# Patient Record
Sex: Male | Born: 1950 | Race: White | Hispanic: No | Marital: Married | Smoking: Never smoker
Health system: Southern US, Community
[De-identification: ages and names within clinical notes are randomized; demographics above are authoritative.]

## PROBLEM LIST (undated history)

## (undated) DIAGNOSIS — E669 Obesity, unspecified: Secondary | ICD-10-CM

## (undated) DIAGNOSIS — I4891 Unspecified atrial fibrillation: Secondary | ICD-10-CM

## (undated) DIAGNOSIS — E7800 Pure hypercholesterolemia, unspecified: Secondary | ICD-10-CM

## (undated) DIAGNOSIS — M1990 Unspecified osteoarthritis, unspecified site: Secondary | ICD-10-CM

## (undated) DIAGNOSIS — E119 Type 2 diabetes mellitus without complications: Secondary | ICD-10-CM

## (undated) DIAGNOSIS — H409 Unspecified glaucoma: Secondary | ICD-10-CM

## (undated) DIAGNOSIS — I4819 Other persistent atrial fibrillation: Secondary | ICD-10-CM

## (undated) DIAGNOSIS — C4491 Basal cell carcinoma of skin, unspecified: Secondary | ICD-10-CM

## (undated) DIAGNOSIS — I509 Heart failure, unspecified: Secondary | ICD-10-CM

## (undated) DIAGNOSIS — IMO0002 Reserved for concepts with insufficient information to code with codable children: Secondary | ICD-10-CM

## (undated) DIAGNOSIS — Z950 Presence of cardiac pacemaker: Secondary | ICD-10-CM

## (undated) DIAGNOSIS — I10 Essential (primary) hypertension: Secondary | ICD-10-CM

## (undated) DIAGNOSIS — I2510 Atherosclerotic heart disease of native coronary artery without angina pectoris: Secondary | ICD-10-CM

## (undated) DIAGNOSIS — R0600 Dyspnea, unspecified: Secondary | ICD-10-CM

## (undated) DIAGNOSIS — M359 Systemic involvement of connective tissue, unspecified: Secondary | ICD-10-CM

## (undated) DIAGNOSIS — K219 Gastro-esophageal reflux disease without esophagitis: Secondary | ICD-10-CM

## (undated) DIAGNOSIS — G4733 Obstructive sleep apnea (adult) (pediatric): Secondary | ICD-10-CM

## (undated) HISTORY — DX: Essential (primary) hypertension: I10

## (undated) HISTORY — DX: Pure hypercholesterolemia, unspecified: E78.00

## (undated) HISTORY — DX: Atherosclerotic heart disease of native coronary artery without angina pectoris: I25.10

## (undated) HISTORY — DX: Obstructive sleep apnea (adult) (pediatric): G47.33

## (undated) HISTORY — DX: Other persistent atrial fibrillation: I48.19

## (undated) HISTORY — DX: Morbid (severe) obesity due to excess calories: E66.01

## (undated) HISTORY — DX: Obesity, unspecified: E66.9

## (undated) HISTORY — PX: KNEE SURGERY: SHX244

## (undated) HISTORY — DX: Unspecified osteoarthritis, unspecified site: M19.90

## (undated) HISTORY — DX: Reserved for concepts with insufficient information to code with codable children: IMO0002

## (undated) HISTORY — PX: TONSILLECTOMY: SUR1361

## (undated) HISTORY — PX: TOTAL HIP ARTHROPLASTY: SHX124

## (undated) HISTORY — DX: Unspecified atrial fibrillation: I48.91

## (undated) HISTORY — DX: Unspecified glaucoma: H40.9

## (undated) HISTORY — DX: Type 2 diabetes mellitus without complications: E11.9

---
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
## 2001-04-17 ENCOUNTER — Encounter: Payer: Self-pay | Admitting: Cardiovascular Disease

## 2002-06-07 ENCOUNTER — Encounter: Admission: RE | Admit: 2002-06-07 | Discharge: 2002-06-07 | Payer: Self-pay | Admitting: Orthopedic Surgery

## 2002-06-07 ENCOUNTER — Encounter: Payer: Self-pay | Admitting: Orthopedic Surgery

## 2003-12-26 ENCOUNTER — Inpatient Hospital Stay (HOSPITAL_COMMUNITY): Admission: RE | Admit: 2003-12-26 | Discharge: 2003-12-30 | Payer: Self-pay | Admitting: Orthopedic Surgery

## 2003-12-26 ENCOUNTER — Ambulatory Visit: Payer: Self-pay | Admitting: Physical Medicine & Rehabilitation

## 2004-09-07 ENCOUNTER — Encounter: Payer: Self-pay | Admitting: Cardiovascular Disease

## 2004-09-07 ENCOUNTER — Ambulatory Visit: Payer: Self-pay | Admitting: Cardiology

## 2006-06-14 ENCOUNTER — Encounter: Payer: Self-pay | Admitting: Cardiovascular Disease

## 2010-03-01 ENCOUNTER — Encounter: Payer: Self-pay | Admitting: Cardiovascular Disease

## 2010-03-30 ENCOUNTER — Encounter: Payer: Self-pay | Admitting: Cardiovascular Disease

## 2010-03-31 ENCOUNTER — Encounter: Payer: Self-pay | Admitting: Cardiovascular Disease

## 2010-03-31 DIAGNOSIS — R072 Precordial pain: Secondary | ICD-10-CM

## 2010-04-16 ENCOUNTER — Encounter: Payer: Self-pay | Admitting: *Deleted

## 2010-04-17 ENCOUNTER — Ambulatory Visit (INDEPENDENT_AMBULATORY_CARE_PROVIDER_SITE_OTHER): Payer: BC Managed Care – PPO | Admitting: Cardiovascular Disease

## 2010-04-17 ENCOUNTER — Encounter: Payer: Self-pay | Admitting: Cardiovascular Disease

## 2010-04-17 ENCOUNTER — Encounter: Payer: Self-pay | Admitting: *Deleted

## 2010-04-17 DIAGNOSIS — Z01810 Encounter for preprocedural cardiovascular examination: Secondary | ICD-10-CM

## 2010-04-17 DIAGNOSIS — I10 Essential (primary) hypertension: Secondary | ICD-10-CM | POA: Insufficient documentation

## 2010-04-17 DIAGNOSIS — E7800 Pure hypercholesterolemia, unspecified: Secondary | ICD-10-CM | POA: Insufficient documentation

## 2010-04-17 DIAGNOSIS — E785 Hyperlipidemia, unspecified: Secondary | ICD-10-CM

## 2010-04-17 DIAGNOSIS — R079 Chest pain, unspecified: Secondary | ICD-10-CM | POA: Insufficient documentation

## 2010-04-17 DIAGNOSIS — R0789 Other chest pain: Secondary | ICD-10-CM

## 2010-04-17 MED ORDER — DIPHENHYDRAMINE HCL 25 MG PO CAPS: ORAL_CAPSULE | ORAL | Status: DC

## 2010-04-17 MED ORDER — RANITIDINE HCL 150 MG PO TABS: ORAL_TABLET | ORAL | Status: DC

## 2010-04-17 MED ORDER — PREDNISONE 20 MG PO TABS: ORAL_TABLET | ORAL | Status: DC

## 2010-04-17 NOTE — Assessment & Plan Note (Signed)
Exertional chest pain and dyspnea suggestive of class 2 and sometime 3 angina. He has multiple risk factors for CAD. His Lexiscan nuclear stress test showed no clear evidence of perfusion defects but there was evidence of TID which is worrisome and can be due to balanced ischemia and 3 vessel CAD. Due to that, I recommend proceeding with cardiac catheterization and possible PCI. Risks and benefits were explained. Will pre treat for dye allergy.  Start Aspirin 81 mg once daily.  Aggressive treatment of risk factors is recommended.

## 2010-04-17 NOTE — Patient Instructions (Signed)
Left JV Cath Follow up will be given at time of discharge from procedure above.
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
## 2010-04-17 NOTE — Assessment & Plan Note (Signed)
BP is well controlled.  Hold Glucophage 2 days before cath.

## 2010-04-17 NOTE — Assessment & Plan Note (Signed)
Continue Simvastatin. 

## 2010-04-23 NOTE — Letter (Signed)
Summary: External Correspondence/  [facility]  External Correspondence/  [facility]   Imported By: Bartholomew Boards 04/13/2010 16:05:04  _____________________________________________________________________  External Attachment:    Type:   Image     Comment:   External Document

## 2010-04-24 LAB — PROTIME-INR

## 2010-04-29 ENCOUNTER — Ambulatory Visit (HOSPITAL_COMMUNITY)
Admission: RE | Admit: 2010-04-29 | Discharge: 2010-04-29 | Disposition: A | Discharge status: Home / Self Care | Payer: BC Managed Care – PPO | Source: Ambulatory Visit | Attending: Cardiovascular Disease | Admitting: Cardiovascular Disease

## 2010-04-29 ENCOUNTER — Inpatient Hospital Stay (HOSPITAL_BASED_OUTPATIENT_CLINIC_OR_DEPARTMENT_OTHER)
Admission: RE | Admit: 2010-04-29 | Discharge: 2010-04-29 | Disposition: A | Discharge status: Hospice | Payer: BC Managed Care – PPO | Source: Ambulatory Visit | Attending: Cardiovascular Disease | Admitting: Cardiovascular Disease

## 2010-04-29 DIAGNOSIS — R0609 Other forms of dyspnea: Secondary | ICD-10-CM | POA: Insufficient documentation

## 2010-04-29 DIAGNOSIS — I2510 Atherosclerotic heart disease of native coronary artery without angina pectoris: Secondary | ICD-10-CM | POA: Insufficient documentation

## 2010-04-29 DIAGNOSIS — I10 Essential (primary) hypertension: Secondary | ICD-10-CM | POA: Insufficient documentation

## 2010-04-29 DIAGNOSIS — R079 Chest pain, unspecified: Secondary | ICD-10-CM | POA: Insufficient documentation

## 2010-04-29 DIAGNOSIS — R0989 Other specified symptoms and signs involving the circulatory and respiratory systems: Secondary | ICD-10-CM | POA: Insufficient documentation

## 2010-04-29 DIAGNOSIS — E119 Type 2 diabetes mellitus without complications: Secondary | ICD-10-CM | POA: Insufficient documentation

## 2010-04-29 DIAGNOSIS — E669 Obesity, unspecified: Secondary | ICD-10-CM | POA: Insufficient documentation

## 2010-04-29 HISTORY — PX: CARDIAC CATHETERIZATION: SHX172

## 2010-04-29 LAB — POCT ACTIVATED CLOTTING TIME
Activated Clotting Time: 111 seconds
Activated Clotting Time: 376 seconds
Activated Clotting Time: 87 seconds

## 2010-05-05 LAB — GLUCOSE, CAPILLARY
Glucose-Capillary: 328 mg/dL — ABNORMAL HIGH (ref 70–99)
Glucose-Capillary: 330 mg/dL — ABNORMAL HIGH (ref 70–99)

## 2010-05-15 ENCOUNTER — Encounter: Payer: Self-pay | Admitting: Cardiovascular Disease

## 2010-05-15 ENCOUNTER — Ambulatory Visit (INDEPENDENT_AMBULATORY_CARE_PROVIDER_SITE_OTHER): Payer: BC Managed Care – PPO | Admitting: Cardiovascular Disease

## 2010-05-15 DIAGNOSIS — R0609 Other forms of dyspnea: Secondary | ICD-10-CM

## 2010-05-15 DIAGNOSIS — E7800 Pure hypercholesterolemia, unspecified: Secondary | ICD-10-CM

## 2010-05-15 DIAGNOSIS — I2510 Atherosclerotic heart disease of native coronary artery without angina pectoris: Secondary | ICD-10-CM | POA: Insufficient documentation

## 2010-05-15 DIAGNOSIS — R0600 Dyspnea, unspecified: Secondary | ICD-10-CM

## 2010-05-15 NOTE — Assessment & Plan Note (Signed)
I recommend a goal LDL of less than 100. Studies with high dose potent statins showed that atherosclerosis can be reversed. Unfortunately, he didn't tolerate Lipitor in past.

## 2010-05-15 NOTE — Assessment & Plan Note (Signed)
Cardiac catheterization showed mild/moderate nonobstructive coronary artery disease. The left main stenosis was found to be not significant by FFR an IVUS.  Aggressive medical therapy of risk factors as well as life style changes are recommended.  He is going to start an exercise program, diet and weight loss.  Continue Aspirin 81 mg daily.  Continue Simvastatin.
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
## 2010-05-15 NOTE — Assessment & Plan Note (Signed)
This is likely multifactorial due to deconditioning, obesity as well as mild diastolic dysfunction.  He also seem to have symptoms suggestive of sleep apnea. Consider a sleep study.

## 2010-05-21 NOTE — Cardiovascular Report (Signed)
NAMEASHTYN, Lawrence Jordan NO.:  [id number]  MEDICAL RECORD NO.:  [id number]           PATIENT TYPE:  O  LOCATION:  [id number]                         FACILITY:  [facility]  PHYSICIAN:  Kathlyn Sacramento, MD     DATE OF BIRTH:  06-02-1950  DATE OF PROCEDURE:  04/29/2010 DATE OF DISCHARGE:  04/29/2010                           CARDIAC CATHETERIZATION   PRIMARY CARE PHYSICIAN:  [name] in [place].  PROCEDURES PERFORMED: 1. Pressure wire interrogation of the left main coronary artery. 2. Intravascular ultrasound of the left main coronary artery.  INDICATION:  Borderline significant left main stenosis noted on left heart catheterization.  STUDY DETAILS:  The patient was brought from the outpatient cardiac cath to the main cath lab.  He already had a 5-French sheath in the right radial artery.  He was given 1 gram of Ancef.  A 5-French sheath was exchanged into a 6-French sheath in a sterile fashion, 3 mg of verapamil was given through the sheath.  He was then given 200 mcg of nitroglycerin through the sheath as well.  His ACT was checked given that he was ready given 7000 units of heparin in the other lab.  His ACT was found to be 110.  Due to that, I elected to start him on IV bivalirudin with subsequent therapeutic ACT.  An FL-3.5 guiding catheter was used to engage the left main coronary artery.  Pressure wire interrogation was performed with Volcano pressure wire in the standard fashion.  IV adenosine 100 mcg/kg per minute was given.  The pressure was recorded after 1-1/2 minutes and then after 2 minutes of giving adenosine.  The patient did have notable tachycardia and hypertension with adenosine infusion and had significant dyspnea and chest pain.  The FL-4 ratio was 0.95, which indicated no physiologic significance of the lesion.  I then proceeded with an IVUS interrogation with an automatic pullback.  This was done twice.  The IVUS and the wire was then removed. Final  angiography showed no change in his coronary anatomy with no complications.  STUDY FINDINGS: 1. Angiography:  There is an ostial left main stenosis, which as     mentioned above varied in certain views to another.  It appears to     be though 40% on current study based on all the data. 2. FFR ratio is 0.95, which indicates no physiologic significance of     the lesion. 3. No significant luminal obstruction by IVUS interrogation.  Left     main area is actually very large and the vessel diameter is more     than 5 mm.  Thus, lesion in the ostium of the left main does not     appear to be significant.  RECOMMENDATIONS:  I recommend aggressive medical therapy.  No revascularization is advised. Recommend controlling his multiple risk factors as well as weight loss.  The patient will likely benefit from weight loss surgery if he is unable to lose weight with lifestyle modifications.     Kathlyn Sacramento, MD     MA/MEDQ  D:  04/29/2010  T:  04/30/2010  Job:  [id number]  cc:   [name]  Electronically Signed by Kathlyn Sacramento MD on 05/21/2010 09:49:44 AM

## 2010-05-21 NOTE — Cardiovascular Report (Signed)
Lawrence Jordan, Lawrence Jordan NO.:  [id number]  MEDICAL RECORD NO.:  [id number]           PATIENT TYPE:  O  LOCATION:  [id number]                         FACILITY:  [facility]  PHYSICIAN:  Lawrence Sacramento, MD     DATE OF BIRTH:  May 19, 1950  DATE OF PROCEDURE:  04/29/2010 DATE OF DISCHARGE:  04/29/2010                           CARDIAC CATHETERIZATION   PRIMARY CARE PHYSICIAN:  Lawrence Jordan in [place].  PROCEDURES PERFORMED: 1. Left heart catheterization 2. Coronary angiography. 3. Left ventricular angiography.  ACCESS:  Right radial artery.  INDICATIONS AND CLINICAL HISTORY:  Lawrence Jordan is a 60 year old gentleman with no previous cardiac history.  He has multiple risk factors for coronary artery disease that include type 2 diabetes, hypertension, hyperlipidemia, obesity and family history of premature coronary artery disease.  He presented with the symptoms of exertional dyspnea as well as occasional chest tightness.  He underwent evaluation with nuclear stress test, which showed no clear evidence of ischemia.  However, there was evidence of transient ischemic dilatation, and I was very concerned about the possibility of balanced ischemia that could be due to left main disease or three-vessel coronary artery disease.  Thus, I recommended proceeding with cardiac catheterization and possible coronary intervention.  STUDY DETAILS:  A standard informed consent was obtained.  The right radial area was prepped in a sterile fashion.  It was anesthetized with 1% lidocaine.  A 5-French sheath was placed in the right radial artery. Coronary angiography was performed with a JL-3.5, AR-1 as well as a pigtail catheter.  The JR-4 catheter could not engage the right coronary artery.  All catheter exchanges were done over the wire.  Left ventricular angiography was performed in the RAO 30 position.  The patient tolerated the procedure well with no immediate complications.  STUDY FINDINGS:   Hemodynamic findings:  Left ventricular pressure was 157/14 with a left ventricular end-diastolic pressure of 21 mmHg. Aortic pressure was 155/89 with a mean pressure of 119 mmHg.  No significant gradient was noted across the aortic valve.  Left ventricular angiography:  This showed normal LV systolic function with an estimated ejection fraction of 60% and no significant mitral regurgitation.  Coronary angiography: 1. Right coronary artery:  The vessel was small in size and     nondominant.  There is a 40% proximal disease. 2. Left main coronary artery:  The vessel was overall large in lumen.     In the ostium, there is an eccentric stenosis, which looks anywhere     from 40 to 60% depending on the view.  The rest of the left main is     actually large in diameter and free of significant disease. 3. Left circumflex artery:  The vessel was large in size and dominant.     It has mild atherosclerosis without evidence of obstructive     disease.  First OM is a small-sized branch and free of significant     disease.  OM-2 was a large size with a vessel with 2 branches and     is free of significant disease.  OM-3 is a normal-sized branch and  free of significant disease.  The distal circumflex gives the left     PDA and as well as posterolateral branches. 4. Left anterior descending artery:  The vessel was normal in size.     There is a 20-30% proximal stenosis as well as 20-30% mid tubular     stenosis.  The rest of the vessel is free of significant disease.     First diagonal is a normal-sized branch with a 60% ostial stenosis.     Second diagonal is normal in size and free of significant disease.     Third diagonal is a small-sized branch.  STUDY CONCLUSIONS: 1. Borderline significant left main disease. 2. Normal LV systolic function. 3. Moderate systemic hypertension and mildly elevated left ventricular     end-diastolic pressure.  RECOMMENDATIONS:  Due to the patient's symptoms  of chest pain and dyspnea as well as his nuclear stress test which showed evidence of transient ischemic dilatation, I think it is important to make sure that this left main stenosis is not physiologically significant.  That cannot be determined by angiography alone.  Thus, I recommend proceeding with a pressure wire evaluation as well as intravascular ultrasound.  Both will be done today.  If the left main stenosis is found to be significant, then coronary artery bypass graft surgery is indicated.  If it comes back insignificant, then we will recommend aggressive medical therapy.     Lawrence Sacramento, MD     MA/MEDQ  D:  04/29/2010  T:  04/30/2010  Job:  [id number]  cc:   Lawrence Jordan  Electronically Signed by Lawrence Sacramento MD on 05/21/2010 09:45:55 AM
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
## 2010-06-23 ENCOUNTER — Encounter: Payer: Self-pay | Admitting: Cardiovascular Disease

## 2012-10-31 ENCOUNTER — Inpatient Hospital Stay (HOSPITAL_COMMUNITY)
Admission: AD | Admit: 2012-10-31 | Discharge: 2012-11-10 | DRG: 544 | Disposition: A | Discharge status: Home / Self Care | Payer: BC Managed Care – PPO | Source: Other Acute Inpatient Hospital | Attending: Cardiology | Admitting: Cardiology

## 2012-10-31 ENCOUNTER — Encounter (HOSPITAL_COMMUNITY): Payer: Self-pay | Admitting: *Deleted

## 2012-10-31 DIAGNOSIS — I509 Heart failure, unspecified: Secondary | ICD-10-CM | POA: Diagnosis present

## 2012-10-31 DIAGNOSIS — E119 Type 2 diabetes mellitus without complications: Secondary | ICD-10-CM | POA: Diagnosis present

## 2012-10-31 DIAGNOSIS — J810 Acute pulmonary edema: Secondary | ICD-10-CM | POA: Diagnosis present

## 2012-10-31 DIAGNOSIS — K2270 Barrett's esophagus without dysplasia: Secondary | ICD-10-CM | POA: Diagnosis present

## 2012-10-31 DIAGNOSIS — E7800 Pure hypercholesterolemia, unspecified: Secondary | ICD-10-CM

## 2012-10-31 DIAGNOSIS — I429 Cardiomyopathy, unspecified: Secondary | ICD-10-CM

## 2012-10-31 DIAGNOSIS — I4892 Unspecified atrial flutter: Secondary | ICD-10-CM | POA: Diagnosis present

## 2012-10-31 DIAGNOSIS — I10 Essential (primary) hypertension: Secondary | ICD-10-CM

## 2012-10-31 DIAGNOSIS — K921 Melena: Secondary | ICD-10-CM | POA: Diagnosis present

## 2012-10-31 DIAGNOSIS — K648 Other hemorrhoids: Secondary | ICD-10-CM | POA: Diagnosis present

## 2012-10-31 DIAGNOSIS — Z6841 Body Mass Index (BMI) 40.0 and over, adult: Secondary | ICD-10-CM

## 2012-10-31 DIAGNOSIS — I4891 Unspecified atrial fibrillation: Principal | ICD-10-CM | POA: Diagnosis present

## 2012-10-31 DIAGNOSIS — Z96649 Presence of unspecified artificial hip joint: Secondary | ICD-10-CM

## 2012-10-31 DIAGNOSIS — M1990 Unspecified osteoarthritis, unspecified site: Secondary | ICD-10-CM | POA: Diagnosis present

## 2012-10-31 DIAGNOSIS — L909 Atrophic disorder of skin, unspecified: Secondary | ICD-10-CM | POA: Diagnosis present

## 2012-10-31 DIAGNOSIS — K644 Residual hemorrhoidal skin tags: Secondary | ICD-10-CM | POA: Diagnosis present

## 2012-10-31 DIAGNOSIS — I5031 Acute diastolic (congestive) heart failure: Secondary | ICD-10-CM | POA: Diagnosis present

## 2012-10-31 DIAGNOSIS — E1165 Type 2 diabetes mellitus with hyperglycemia: Secondary | ICD-10-CM | POA: Diagnosis present

## 2012-10-31 DIAGNOSIS — I2510 Atherosclerotic heart disease of native coronary artery without angina pectoris: Secondary | ICD-10-CM | POA: Diagnosis present

## 2012-10-31 DIAGNOSIS — I428 Other cardiomyopathies: Secondary | ICD-10-CM | POA: Diagnosis present

## 2012-10-31 DIAGNOSIS — E876 Hypokalemia: Secondary | ICD-10-CM | POA: Diagnosis present

## 2012-10-31 DIAGNOSIS — K922 Gastrointestinal hemorrhage, unspecified: Secondary | ICD-10-CM | POA: Diagnosis present

## 2012-10-31 LAB — MRSA PCR SCREENING: MRSA by PCR: NEGATIVE

## 2012-10-31 MED ORDER — SODIUM CHLORIDE 0.9 % IJ SOLN: 3.0000 mL | INTRAMUSCULAR | Status: DC | PRN

## 2012-10-31 MED ORDER — SIMVASTATIN 20 MG PO TABS
20.0000 mg | ORAL_TABLET | Freq: Every day | ORAL | Status: DC
  Administered 2012-11-01 – 2012-11-09 (×10): 20 mg via ORAL
  Filled 2012-10-31 (×11): qty 1

## 2012-10-31 MED ORDER — ACETAMINOPHEN 325 MG PO TABS
650.0000 mg | ORAL_TABLET | ORAL | Status: DC | PRN
  Administered 2012-11-01 – 2012-11-09 (×2): 650 mg via ORAL
  Filled 2012-10-31 (×2): qty 2

## 2012-10-31 MED ORDER — DILTIAZEM HCL 100 MG IV SOLR
5.0000 mg/h | INTRAVENOUS | Status: DC
  Administered 2012-10-31 – 2012-11-02 (×4): 10 mg/h via INTRAVENOUS
  Administered 2012-11-03 – 2012-11-05 (×5): 15 mg/h via INTRAVENOUS
  Filled 2012-10-31 (×11): qty 100

## 2012-10-31 MED ORDER — SODIUM CHLORIDE 0.9 % IV SOLN: 250.0000 mL | INTRAVENOUS | Status: DC | PRN

## 2012-10-31 MED ORDER — ASPIRIN EC 325 MG PO TBEC
325.0000 mg | DELAYED_RELEASE_TABLET | Freq: Every day | ORAL | Status: DC
  Administered 2012-11-01: 325 mg via ORAL
  Filled 2012-10-31 (×2): qty 1

## 2012-10-31 MED ORDER — PANTOPRAZOLE SODIUM 40 MG PO TBEC
40.0000 mg | DELAYED_RELEASE_TABLET | Freq: Every day | ORAL | Status: DC
  Administered 2012-11-01 – 2012-11-10 (×11): 40 mg via ORAL
  Filled 2012-10-31 (×11): qty 1

## 2012-10-31 MED ORDER — LISINOPRIL 20 MG PO TABS
20.0000 mg | ORAL_TABLET | Freq: Every day | ORAL | Status: DC
  Administered 2012-11-01: 20 mg via ORAL
  Filled 2012-10-31 (×2): qty 1

## 2012-10-31 MED ORDER — ONDANSETRON HCL 4 MG/2ML IJ SOLN: 4.0000 mg | Freq: Four times a day (QID) | INTRAMUSCULAR | Status: DC | PRN

## 2012-10-31 MED ORDER — SODIUM CHLORIDE 0.9 % IJ SOLN
3.0000 mL | Freq: Two times a day (BID) | INTRAMUSCULAR | Status: DC
  Administered 2012-11-01 – 2012-11-07 (×8): 3 mL via INTRAVENOUS

## 2012-10-31 MED ORDER — HEPARIN (PORCINE) IN NACL 100-0.45 UNIT/ML-% IJ SOLN
2100.0000 [IU]/h | INTRAMUSCULAR | Status: AC
  Administered 2012-10-31: 1700 [IU]/h via INTRAVENOUS
  Administered 2012-11-02 – 2012-11-03 (×3): 2100 [IU]/h via INTRAVENOUS
  Filled 2012-10-31 (×6): qty 250

## 2012-10-31 MED ORDER — METOPROLOL TARTRATE 1 MG/ML IV SOLN
5.0000 mg | Freq: Four times a day (QID) | INTRAVENOUS | Status: AC
  Administered 2012-11-01 (×3): 5 mg via INTRAVENOUS
  Filled 2012-10-31 (×4): qty 5

## 2012-10-31 MED ORDER — LATANOPROST 0.005 % OP SOLN
1.0000 [drp] | Freq: Every day | OPHTHALMIC | Status: DC
  Administered 2012-11-01 – 2012-11-09 (×10): 1 [drp] via OPHTHALMIC
  Filled 2012-10-31: qty 2.5

## 2012-10-31 MED ORDER — HEPARIN BOLUS VIA INFUSION
2000.0000 [IU] | Freq: Once | INTRAVENOUS | Status: AC
  Administered 2012-11-01: 2000 [IU] via INTRAVENOUS
  Filled 2012-10-31: qty 2000

## 2012-10-31 MED ORDER — FUROSEMIDE 10 MG/ML IJ SOLN
20.0000 mg | Freq: Once | INTRAMUSCULAR | Status: AC
  Administered 2012-11-01: 20 mg via INTRAVENOUS
  Filled 2012-10-31: qty 2
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
## 2012-11-01 ENCOUNTER — Inpatient Hospital Stay (HOSPITAL_COMMUNITY): Payer: BC Managed Care – PPO | Admitting: Certified Registered Nurse Anesthetist

## 2012-11-01 ENCOUNTER — Encounter (HOSPITAL_COMMUNITY)
Admission: AD | Disposition: A | Discharge status: Home / Self Care | Payer: Self-pay | Source: Other Acute Inpatient Hospital | Attending: Cardiology

## 2012-11-01 ENCOUNTER — Encounter (HOSPITAL_COMMUNITY): Payer: Self-pay

## 2012-11-01 ENCOUNTER — Encounter (HOSPITAL_COMMUNITY): Payer: BC Managed Care – PPO | Admitting: Certified Registered Nurse Anesthetist

## 2012-11-01 DIAGNOSIS — I4892 Unspecified atrial flutter: Secondary | ICD-10-CM

## 2012-11-01 HISTORY — PX: TEE WITHOUT CARDIOVERSION: SHX5443

## 2012-11-01 HISTORY — PX: CARDIOVERSION: SHX1299

## 2012-11-01 LAB — CBC WITH DIFFERENTIAL/PLATELET
Basophils Absolute: 0 10*3/uL (ref 0.0–0.1)
Basophils Relative: 0 % (ref 0–1)
Eosinophils Absolute: 0.1 10*3/uL (ref 0.0–0.7)
Eosinophils Relative: 2 % (ref 0–5)
HCT: 38.5 % — ABNORMAL LOW (ref 39.0–52.0)
Hemoglobin: 13.1 g/dL (ref 13.0–17.0)
Lymphocytes Relative: 44 % (ref 12–46)
Lymphs Abs: 3.1 10*3/uL (ref 0.7–4.0)
MCH: 32.2 pg (ref 26.0–34.0)
MCHC: 34 g/dL (ref 30.0–36.0)
MCV: 94.6 fL (ref 78.0–100.0)
Monocytes Absolute: 0.5 10*3/uL (ref 0.1–1.0)
Monocytes Relative: 7 % (ref 3–12)
Neutro Abs: 3.4 10*3/uL (ref 1.7–7.7)
Neutrophils Relative %: 48 % (ref 43–77)
Platelets: 152 10*3/uL (ref 150–400)
RBC: 4.07 MIL/uL — ABNORMAL LOW (ref 4.22–5.81)
RDW: 13.3 % (ref 11.5–15.5)
WBC: 7 10*3/uL (ref 4.0–10.5)

## 2012-11-01 LAB — HEMOGLOBIN A1C
Hgb A1c MFr Bld: 9.6 % — ABNORMAL HIGH (ref <5.7)
Mean Plasma Glucose: 229 mg/dL — ABNORMAL HIGH (ref <117)

## 2012-11-01 LAB — COMPREHENSIVE METABOLIC PANEL
ALT: 61 U/L — ABNORMAL HIGH (ref 0–53)
AST: 54 U/L — ABNORMAL HIGH (ref 0–37)
Albumin: 3.6 g/dL (ref 3.5–5.2)
Alkaline Phosphatase: 44 U/L (ref 39–117)
BUN: 10 mg/dL (ref 6–23)
CO2: 24 mEq/L (ref 19–32)
Calcium: 8.9 mg/dL (ref 8.4–10.5)
Chloride: 100 mEq/L (ref 96–112)
Creatinine, Ser: 0.66 mg/dL (ref 0.50–1.35)
GFR calc Af Amer: >90 mL/min (ref >90)
GFR calc non Af Amer: >90 mL/min (ref >90)
Glucose, Bld: 181 mg/dL — ABNORMAL HIGH (ref 70–99)
Potassium: 3.5 mEq/L (ref 3.5–5.1)
Sodium: 137 mEq/L (ref 135–145)
Total Bilirubin: 0.5 mg/dL (ref 0.3–1.2)
Total Protein: 6.5 g/dL (ref 6.0–8.3)

## 2012-11-01 LAB — BASIC METABOLIC PANEL
BUN: 11 mg/dL (ref 6–23)
CO2: 22 mEq/L (ref 19–32)
Calcium: 9.1 mg/dL (ref 8.4–10.5)
Chloride: 102 mEq/L (ref 96–112)
Creatinine, Ser: 0.64 mg/dL (ref 0.50–1.35)
GFR calc Af Amer: >90 mL/min (ref >90)
GFR calc non Af Amer: >90 mL/min (ref >90)
Glucose, Bld: 227 mg/dL — ABNORMAL HIGH (ref 70–99)
Potassium: 3.8 mEq/L (ref 3.5–5.1)
Sodium: 138 mEq/L (ref 135–145)

## 2012-11-01 LAB — GLUCOSE, CAPILLARY
Glucose-Capillary: 157 mg/dL — ABNORMAL HIGH (ref 70–99)
Glucose-Capillary: 170 mg/dL — ABNORMAL HIGH (ref 70–99)
Glucose-Capillary: 188 mg/dL — ABNORMAL HIGH (ref 70–99)
Glucose-Capillary: 184 mg/dL — ABNORMAL HIGH (ref 70–99)
Glucose-Capillary: 188 mg/dL — ABNORMAL HIGH (ref 70–99)
Glucose-Capillary: 248 mg/dL — ABNORMAL HIGH (ref 70–99)

## 2012-11-01 LAB — PROTIME-INR
INR: 1.07 (ref 0.00–1.49)
Prothrombin Time: 13.7 seconds (ref 11.6–15.2)

## 2012-11-01 LAB — MAGNESIUM: Magnesium: 1.3 mg/dL — ABNORMAL LOW (ref 1.5–2.5)

## 2012-11-01 LAB — HEPARIN LEVEL (UNFRACTIONATED)
Heparin Unfractionated: 0.1 IU/mL — ABNORMAL LOW (ref 0.30–0.70)
Heparin Unfractionated: 0.3 IU/mL (ref 0.30–0.70)

## 2012-11-01 LAB — TSH: TSH: 3.613 u[IU]/mL (ref 0.350–4.500)

## 2012-11-01 LAB — TROPONIN I
Troponin I: <0.3 ng/mL (ref <0.30)
Troponin I: <0.3 ng/mL (ref <0.30)
Troponin I: <0.3 ng/mL (ref <0.30)

## 2012-11-01 SURGERY — Surgical Case
Anesthesia: *Unknown

## 2012-11-01 SURGERY — ECHOCARDIOGRAM, TRANSESOPHAGEAL
Anesthesia: General

## 2012-11-01 MED ORDER — MAGNESIUM OXIDE 400 (241.3 MG) MG PO TABS
400.0000 mg | ORAL_TABLET | Freq: Two times a day (BID) | ORAL | Status: DC
  Administered 2012-11-01 (×2): 400 mg via ORAL
  Filled 2012-11-01 (×4): qty 1

## 2012-11-01 MED ORDER — FENTANYL CITRATE 0.05 MG/ML IJ SOLN
INTRAMUSCULAR | Status: DC | PRN
  Administered 2012-11-01: 50 ug via INTRAVENOUS

## 2012-11-01 MED ORDER — BUTAMBEN-TETRACAINE-BENZOCAINE 2-2-14 % EX AERO
INHALATION_SPRAY | CUTANEOUS | Status: DC | PRN
  Administered 2012-11-01: 2 via TOPICAL

## 2012-11-01 MED ORDER — LIDOCAINE HCL (CARDIAC) 20 MG/ML IV SOLN
INTRAVENOUS | Status: DC | PRN
  Administered 2012-11-01: 100 mg via INTRAVENOUS

## 2012-11-01 MED ORDER — FENTANYL CITRATE 0.05 MG/ML IJ SOLN
INTRAMUSCULAR | Status: AC
  Filled 2012-11-01: qty 2

## 2012-11-01 MED ORDER — SODIUM CHLORIDE 0.9 % IV SOLN: INTRAVENOUS | Status: DC

## 2012-11-01 MED ORDER — FUROSEMIDE 10 MG/ML IJ SOLN
INTRAMUSCULAR | Status: AC
  Filled 2012-11-01: qty 4

## 2012-11-01 MED ORDER — IBUPROFEN 400 MG PO TABS
400.0000 mg | ORAL_TABLET | Freq: Four times a day (QID) | ORAL | Status: DC | PRN
  Administered 2012-11-01: 400 mg via ORAL
  Filled 2012-11-01 (×2): qty 1

## 2012-11-01 MED ORDER — PROPOFOL 10 MG/ML IV BOLUS
INTRAVENOUS | Status: DC | PRN
  Administered 2012-11-01: 80 mg via INTRAVENOUS

## 2012-11-01 MED ORDER — HYDROCORTISONE 1 % EX CREA
TOPICAL_CREAM | Freq: Two times a day (BID) | CUTANEOUS | Status: DC
  Administered 2012-11-01 – 2012-11-02 (×3): via TOPICAL
  Administered 2012-11-03: 1 via TOPICAL
  Administered 2012-11-04: 10:00:00 via TOPICAL
  Administered 2012-11-04: 1 via TOPICAL
  Administered 2012-11-05 (×2): via TOPICAL
  Administered 2012-11-06: 1 via TOPICAL
  Administered 2012-11-06 – 2012-11-07 (×3): via TOPICAL
  Administered 2012-11-08: 1 via TOPICAL
  Administered 2012-11-08 – 2012-11-10 (×4): via TOPICAL
  Filled 2012-11-01: qty 28

## 2012-11-01 MED ORDER — INSULIN ASPART 100 UNIT/ML ~~LOC~~ SOLN
0.0000 [IU] | SUBCUTANEOUS | Status: DC
  Administered 2012-11-01: 3 [IU] via SUBCUTANEOUS
  Administered 2012-11-01: 5 [IU] via SUBCUTANEOUS
  Administered 2012-11-01: 3 [IU] via SUBCUTANEOUS
  Administered 2012-11-02: 2 [IU] via SUBCUTANEOUS
  Administered 2012-11-02: 3 [IU] via SUBCUTANEOUS
  Administered 2012-11-02 (×2): 8 [IU] via SUBCUTANEOUS
  Administered 2012-11-03: 3 [IU] via SUBCUTANEOUS
  Administered 2012-11-03: 11 [IU] via SUBCUTANEOUS
  Administered 2012-11-03: 3 [IU] via SUBCUTANEOUS
  Administered 2012-11-04: 8 [IU] via SUBCUTANEOUS
  Administered 2012-11-04 (×2): 11 [IU] via SUBCUTANEOUS
  Administered 2012-11-04: 3 [IU] via SUBCUTANEOUS
  Administered 2012-11-04: 2 [IU] via SUBCUTANEOUS
  Administered 2012-11-04 – 2012-11-05 (×2): 3 [IU] via SUBCUTANEOUS
  Administered 2012-11-05: 8 [IU] via SUBCUTANEOUS
  Administered 2012-11-05: 3 [IU] via SUBCUTANEOUS
  Administered 2012-11-05: 5 [IU] via SUBCUTANEOUS
  Administered 2012-11-05: 8 [IU] via SUBCUTANEOUS
  Administered 2012-11-05: 11 [IU] via SUBCUTANEOUS
  Administered 2012-11-06: 3 [IU] via SUBCUTANEOUS
  Administered 2012-11-06: 8 [IU] via SUBCUTANEOUS
  Administered 2012-11-06: 5 [IU] via SUBCUTANEOUS
  Administered 2012-11-06: 8 [IU] via SUBCUTANEOUS
  Administered 2012-11-06: 11 [IU] via SUBCUTANEOUS
  Administered 2012-11-06: 3 [IU] via SUBCUTANEOUS
  Administered 2012-11-07: 5 [IU] via SUBCUTANEOUS
  Administered 2012-11-07: 3 [IU] via SUBCUTANEOUS
  Administered 2012-11-07: 8 [IU] via SUBCUTANEOUS
  Administered 2012-11-07 (×2): 3 [IU] via SUBCUTANEOUS
  Administered 2012-11-07 – 2012-11-08 (×3): 5 [IU] via SUBCUTANEOUS
  Administered 2012-11-08: 2 [IU] via SUBCUTANEOUS
  Administered 2012-11-08: 5 [IU] via SUBCUTANEOUS
  Administered 2012-11-08: 2 [IU] via SUBCUTANEOUS
  Administered 2012-11-08: 5 [IU] via SUBCUTANEOUS
  Administered 2012-11-09: 8 [IU] via SUBCUTANEOUS
  Administered 2012-11-09: 5 [IU] via SUBCUTANEOUS
  Administered 2012-11-09: 3 [IU] via SUBCUTANEOUS
  Administered 2012-11-09: 5 [IU] via SUBCUTANEOUS
  Administered 2012-11-09: 8 [IU] via SUBCUTANEOUS
  Administered 2012-11-09 – 2012-11-10 (×2): 3 [IU] via SUBCUTANEOUS
  Administered 2012-11-10: 8 [IU] via SUBCUTANEOUS

## 2012-11-01 MED ORDER — MIDAZOLAM HCL 5 MG/ML IJ SOLN
INTRAMUSCULAR | Status: AC
  Filled 2012-11-01: qty 2

## 2012-11-01 MED ORDER — MIDAZOLAM HCL 10 MG/2ML IJ SOLN
INTRAMUSCULAR | Status: DC | PRN
  Administered 2012-11-01: 3 mg via INTRAVENOUS

## 2012-11-01 NOTE — CV Procedure (Addendum)
       Transesophageal Echocardiogram Note  MALLORY JUSTEN SO:[id number] 02-20-1950  Procedure: Transesophageal Echocardiogram Indications: atrial flutter  Procedure Details Consent: Obtained Time Out: Verified patient identification, verified procedure, site/side was marked, verified correct patient position, special equipment/implants available, Radiology Safety Procedures followed,  medications/allergies/relevent history reviewed, required imaging and test results available.  Performed  Medications: Fentanyl: 50 mcg IV Versed: 3 mg IV  Left Ventrical:  Lower limits of normal - EF ~ 50%  Mitral Valve: mild-mod MR  Aortic Valve: 3 leaflet valve, mild - mod AI  Tricuspid Valve: normal  Pulmonic Valve: no sig. PI  Left Atrium/ Left atrial appendage: small, no thrombi seen, good function by pulse wave doppler  Atrial septum: intact by color flow  Aorta: mild calcification   Complications: No apparent complications Patient did tolerate procedure well.      Cardioversion Note  DAMASCUS BLOT SO:[id number] 1950/11/20  Procedure: DC Cardioversion Indications: atrial flutter, atrial fibrillation  Procedure Details Consent: Obtained Time Out: Verified patient identification, verified procedure, site/side was marked, verified correct patient position, special equipment/implants available, Radiology Safety Procedures followed,  medications/allergies/relevent history reviewed, required imaging and test results available.  Performed  The patient has been on adequate anticoagulation.  The patient received IV Lidocaine 100 mg and Propofol 80 mg  for sedation.  Synchronous cardioversion was performed at 50, 200, 200  Joules.  There was some arcing during the second cardioversion attempt.   He converted to NSR for 1 beat and then went back into atrial fibrillation.   The cardioversion was unsuccessful.      Complications: No apparent complications Patient did tolerate  procedure well.   [name], Brooke Bonito., MD, [facility] 11/01/2012, 3:06 PM

## 2012-11-01 NOTE — Progress Notes (Signed)
    I have seen and examined the patient. I agree with the above note with the addition of : newly diagnosed A-flutter with RVR likely of more than 48 hours duration. Intermittent BRBPR likely due to hemorrhoids but this needs evaluation by GI before hospital discharge.  He is still tachycardiac with no murmurs. He is in atrial flutter with variable AV block. Rate difficult to control in spite of Diltiazem. I think the best option is TEE/DCCV. Risks, benefits and alternatives were discussed.  Hgb is stable. Consult GI for colonoscopy before hospital discharge to ensure at least short term anticoagulation. Continue Heparin for now.    Kathlyn Sacramento MD, [facility] 11/01/2012 11:56 AM

## 2012-11-01 NOTE — Anesthesia Postprocedure Evaluation (Signed)
  Anesthesia Post-op Note  Patient: Lawrence Jordan  Procedure(s) Performed: Procedure(s): TRANSESOPHAGEAL ECHOCARDIOGRAM (TEE) (N/A) CARDIOVERSION (N/A)  Patient Location: Endoscopy Unit  Anesthesia Type:MAC  Level of Consciousness: awake and alert   Airway and Oxygen Therapy: Patient Spontanous Breathing and Patient connected to nasal cannula oxygen  Post-op Pain: none  Post-op Assessment: Post-op Vital signs reviewed, Patient's Cardiovascular Status Stable, Respiratory Function Stable, Patent Airway and No signs of Nausea or vomiting  Post-op Vital Signs: Reviewed and stable  Complications: No apparent anesthesia complications

## 2012-11-01 NOTE — Preoperative (Signed)
Beta Blockers   Reason not to administer Beta Blockers:Not Applicable 

## 2012-11-01 NOTE — Progress Notes (Signed)
Patient Name: Lawrence Jordan Date of Encounter: 11/01/2012  Principal Problem:   Atrial flutter with rapid ventricular response Active Problems:   Atrial fibrillation   Lower GI bleeding   Acute edema of lung, unspecified    SUBJECTIVE: No real change in SOB (Lasix held till this am due to low BP)  OBJECTIVE Filed Vitals:   11/01/12 0445 11/01/12 0500 11/01/12 0600 11/01/12 0814  BP: 115/68 86/72 93/71  133/80  Pulse: 124 99 74 127  Temp: 97.4 F (36.3 C)   97.6 F (36.4 C)  TempSrc: Oral   Oral  Resp: 14 16 16 15   Height:      Weight: 335 lb 1.6 oz (152 kg)     SpO2: 96% 96% 96% 96%    Intake/Output Summary (Last 24 hours) at 11/01/12 1105 Last data filed at 11/01/12 1000  Gross per 24 hour  Intake    300 ml  Output   2176 ml  Net  -1876 ml   Filed Weights   10/31/12 2134 11/01/12 0445  Weight: 335 lb 1.6 oz (152 kg) 335 lb 1.6 oz (152 kg)    PHYSICAL EXAM General: Well developed, well nourished, male in no acute distress. Head: Normocephalic, atraumatic.  Neck: Supple without bruits, JVD at 10 cm. Lungs:  Resp regular and unlabored, basilar rales. Heart: rapid and irregular, S1, S2, no S3, S4, or murmur; no rub. Abdomen: Soft, non-tender, non-distended, BS + x 4.  Extremities: No clubbing, cyanosis, no edema.  Neuro: Alert and oriented X 3. Moves all extremities spontaneously. Psych: Normal affect.  LABS: CBC: Lab Results  Component Value Date   WBC 7.0 11/01/2012   HGB 13.1 11/01/2012   HCT 38.5* 11/01/2012   MCV 94.6 11/01/2012   PLT 152 [id number]   Basic Metabolic Panel:  Recent Labs  10/31/12 2312 11/01/12 0742  NA 138 137  K 3.8 3.5  CL 102 100  CO2 22 24  GLUCOSE 227* 181*  BUN 11 10  CREATININE 0.64 0.66  CALCIUM 9.1 8.9  MG  --  1.3*   Liver Function Tests:  Recent Labs  11/01/12 0742  AST 54*  ALT 61*  ALKPHOS 44  BILITOT 0.5  PROT 6.5  ALBUMIN 3.6   Cardiac Enzymes:  Recent Labs  10/31/12 2312 11/01/12 0659    TROPONINI <0.30 <0.30   Hemoglobin A1C:No results found for this basename: HGBA1C,  in the last 72 hours  Thyroid Function Tests:No results found for this basename: TSH, T4TOTAL, FREET3, T3FREE, THYROIDAB,  in the last 72 hours  TELE:   Atrial flutter, RVR   Current Medications:  . aspirin EC  325 mg Oral Daily  . furosemide      . insulin aspart  0-15 Units Subcutaneous Q4H  . latanoprost  1 drop Both Eyes QHS  . lisinopril  20 mg Oral Daily  . magnesium oxide  400 mg Oral BID  . metoprolol  5 mg Intravenous Q6H  . pantoprazole  40 mg Oral Daily  . simvastatin  20 mg Oral QHS  . sodium chloride  3 mL Intravenous Q12H   . diltiazem (CARDIZEM) infusion 10 mg/hr (10/31/12 2358)  . heparin 2,100 Units/hr (11/01/12 1035)    ASSESSMENT AND PLAN: Principal Problem:   Atrial flutter with rapid ventricular response - Cardizem helping w/ rate control, but not tolerating well. Will need TEE/DCCV when otherwise stable.  Active Problems:   Acute edema of lung, unspecified - Lasix 20 mg just  given; he is Lasix naive and had borderline BP overnight so will follow results and give more if does not diurese at least 1 liter.    Atrial fibrillation - some tele looks like fib, MD review and advise.    Lower GI bleeding - by reports, BRBPR, CBC pending, no hx anemia    Anticoagulation - heparin only right now, will decide on further once GI issues figured out.    Jonetta Speak , PA-C 11:05 AM 11/01/2012

## 2012-11-01 NOTE — OR Nursing (Signed)
Patient cardioverted three times consecutively 50, 200, 200 joules biphasic by Dr. Acie Fredrickson.  Shock on second cardioversion arched of chest pads.  PAds within expiration date of 09-02-2013

## 2012-11-01 NOTE — Progress Notes (Signed)
ANTICOAGULATION CONSULT NOTE - Follow Up Consult  Pharmacy Consult for Heparin Indication: atrial fibrillation  Allergies  Allergen Reactions  . Iodinated Diagnostic Agents Anaphylaxis  . Sulfa Antibiotics Rash    Patient Measurements: Height: 5\' 11"  (180.3 cm) Weight: 335 lb 1.6 oz (152 kg) IBW/kg (Calculated) : 75.3 Heparin Dosing Weight: 110 kg  Vital Signs: Temp: 97.6 F (36.4 C) (10/08 0814) Temp src: Oral (10/08 0814) BP: 133/80 mmHg (10/08 0814) Pulse Rate: 127 (10/08 0814)  Labs:  Recent Labs  10/31/12 2312 11/01/12 0659 11/01/12 0742  HGB  --   --  13.1  HCT  --   --  38.5*  PLT  --   --  152  LABPROT  --   --  13.7  INR  --   --  1.07  HEPARINUNFRC  --   --  0.10*  CREATININE 0.64  --  0.66  TROPONINI <0.30 <0.30  --     Estimated Creatinine Clearance: 145.4 ml/min (by C-G formula based on Cr of 0.66).  Assessment:  Initial heparin level is low (0.10) on 1700 units/hr, after 2000 unit bolus. Low bolus requested due to hx BRBPR intermittently over the last month.  No stools to check here yet. LBM 10/6. (Spoke with wife; patient resting.) Platelet count low normal.  Noted will need TEE/DCCV.  Goal of Therapy:  Heparin level 0.3-0.7 units/ml Monitor platelets by anticoagulation protocol: Yes   Plan:   Increase heparin drip to 2100 units/hr.  Next heparin level in ~6 hours.  Daily heparin level and CBC while on heparin.  Follow up for any BRBPR.  Arty Baumgartner, [place] Pager: [phone] 11/01/2012,10:27 AM

## 2012-11-01 NOTE — Progress Notes (Signed)
Spoke with Dr. Penelope Coop. Not able to prep for colonoscopy due to procedure today.  He requested we put pt on clear liquids after procedure and do those for 24 hours. Pt possibly able to complete prep tomorrow.  He will see in am, tentatively plan EGD/Colon on Friday.

## 2012-11-01 NOTE — Interval H&P Note (Signed)
History and Physical Interval Note:  11/01/2012 2:43 PM  Lawrence Jordan  has presented today for surgery, with the diagnosis of sfib  The various methods of treatment have been discussed with the patient and family. After consideration of risks, benefits and other options for treatment, the patient has consented to  Procedure(s): TRANSESOPHAGEAL ECHOCARDIOGRAM (TEE) (N/A) CARDIOVERSION (N/A) as a surgical intervention .  The patient's history has been reviewed, patient examined, no change in status, stable for surgery.  I have reviewed the patient's chart and labs.  Questions were answered to the patient's satisfaction.     [name].

## 2012-11-01 NOTE — Progress Notes (Signed)
Echocardiogram Echocardiogram Transesophageal has been performed.  11/01/2012 3:35 PM Maudry Mayhew, RVT, RDCS, RDMS

## 2012-11-01 NOTE — Transfer of Care (Signed)
Immediate Anesthesia Transfer of Care Note  Patient: Lawrence Jordan  Procedure(s) Performed: Procedure(s): TRANSESOPHAGEAL ECHOCARDIOGRAM (TEE) (N/A) CARDIOVERSION (N/A)  Patient Location: Endoscopy Unit  Anesthesia Type:MAC  Level of Consciousness: awake, alert  and oriented  Airway & Oxygen Therapy: Patient Spontanous Breathing and Patient connected to nasal cannula oxygen  Post-op Assessment: Report given to PACU RN, Post -op Vital signs reviewed and stable and Patient moving all extremities  Post vital signs: Reviewed and stable  Complications: No apparent anesthesia complications

## 2012-11-01 NOTE — Anesthesia Procedure Notes (Signed)
Procedure Name: MAC Date/Time: 11/01/2012 3:15 PM Performed by: Maryland Pink Pre-anesthesia Checklist: Patient identified, Patient being monitored, Emergency Drugs available, Timeout performed and Suction available Patient Re-evaluated:Patient Re-evaluated prior to inductionOxygen Delivery Method: Ambu bag Preoxygenation: Pre-oxygenation with 100% oxygen Intubation Type: IV induction Ventilation: Mask ventilation without difficulty Dental Injury: Teeth and Oropharynx as per pre-operative assessment

## 2012-11-01 NOTE — Progress Notes (Signed)
ANTICOAGULATION CONSULT NOTE - Follow Up Consult  Pharmacy Consult for Heparin Indication: atrial fibrillation  Allergies  Allergen Reactions  . Iodinated Diagnostic Agents Anaphylaxis  . Sulfa Antibiotics Rash   Patient Measurements: Height: 5\' 11"  (180.3 cm) Weight: 335 lb 1.6 oz (152 kg) IBW/kg (Calculated) : 75.3 Heparin Dosing Weight: 110 kg  Vital Signs: Temp: 98 F (36.7 C) (10/08 1640) Temp src: Oral (10/08 1640) BP: 103/58 mmHg (10/08 1800) Pulse Rate: 44 (10/08 1800)  Labs:  Recent Labs  10/31/12 2312 11/01/12 0659 11/01/12 0742 11/01/12 1202 11/01/12 1834  HGB  --   --  13.1  --   --   HCT  --   --  38.5*  --   --   PLT  --   --  152  --   --   LABPROT  --   --  13.7  --   --   INR  --   --  1.07  --   --   HEPARINUNFRC  --   --  0.10*  --  0.30  CREATININE 0.64  --  0.66  --   --   TROPONINI <0.30 <0.30  --  <0.30  --    Estimated Creatinine Clearance: 145.4 ml/min (by C-G formula based on Cr of 0.66).  Assessment: 61yo male who is s/p failed cardioversion for Afib with RVR.  His heparin level is just at the therapeutic goal (0.3) IU/ml.  He is without noted bleeding complications, but due to recent history of guaiac positive stools and BRBPR, will continue current rate and follow in AM to see if he needs dosing adjustment.  Goal of Therapy:  Heparin level 0.3-0.7 units/ml Monitor platelets by anticoagulation protocol: Yes   Plan:   - Continue IV heparin drip at 2100 units/hr. -  Daily heparin level and CBC while on heparin. -  Follow up for any BRBPR or any s/s of bleeding  Rober Minion, PharmD., MS Clinical Pharmacist Pager:  [phone] Thank you for allowing pharmacy to be part of this patients care team. 11/01/2012,7:36 PM

## 2012-11-01 NOTE — Anesthesia Preprocedure Evaluation (Signed)
Anesthesia Evaluation  Patient identified by MRN, date of birth, ID band Patient awake    Reviewed: Allergy & Precautions, H&P , NPO status , Patient's Chart, lab work & pertinent test results, reviewed documented beta blocker date and time   Airway Mallampati: II TM Distance: >3 FB Neck ROM: full    Dental   Pulmonary shortness of breath,  breath sounds clear to auscultation        Cardiovascular hypertension, On Medications + CAD + dysrhythmias Atrial Fibrillation Rhythm:regular     Neuro/Psych negative neurological ROS  negative psych ROS   GI/Hepatic negative GI ROS, Neg liver ROS,   Endo/Other  diabetes, Oral Hypoglycemic Agents  Renal/GU negative Renal ROS  negative genitourinary   Musculoskeletal   Abdominal   Peds  Hematology negative hematology ROS (+)   Anesthesia Other Findings See surgeon's H&P   Reproductive/Obstetrics negative OB ROS                           Anesthesia Physical Anesthesia Plan  ASA: III  Anesthesia Plan: General   Post-op Pain Management:    Induction: Intravenous  Airway Management Planned: Mask  Additional Equipment:   Intra-op Plan:   Post-operative Plan:   Informed Consent: I have reviewed the patients History and Physical, chart, labs and discussed the procedure including the risks, benefits and alternatives for the proposed anesthesia with the patient or authorized representative who has indicated his/her understanding and acceptance.   Dental Advisory Given  Plan Discussed with: CRNA and Surgeon  Anesthesia Plan Comments:         Anesthesia Quick Evaluation

## 2012-11-02 ENCOUNTER — Encounter (HOSPITAL_COMMUNITY): Payer: Self-pay | Admitting: Cardiovascular Disease

## 2012-11-02 DIAGNOSIS — I4891 Unspecified atrial fibrillation: Principal | ICD-10-CM

## 2012-11-02 LAB — COMPREHENSIVE METABOLIC PANEL
ALT: 72 U/L — ABNORMAL HIGH (ref 0–53)
AST: 73 U/L — ABNORMAL HIGH (ref 0–37)
Albumin: 3.7 g/dL (ref 3.5–5.2)
Alkaline Phosphatase: 46 U/L (ref 39–117)
BUN: 11 mg/dL (ref 6–23)
CO2: 23 mEq/L (ref 19–32)
Calcium: 8.8 mg/dL (ref 8.4–10.5)
Chloride: 101 mEq/L (ref 96–112)
Creatinine, Ser: 0.72 mg/dL (ref 0.50–1.35)
GFR calc Af Amer: >90 mL/min (ref >90)
GFR calc non Af Amer: >90 mL/min (ref >90)
Glucose, Bld: 151 mg/dL — ABNORMAL HIGH (ref 70–99)
Potassium: 3.3 mEq/L — ABNORMAL LOW (ref 3.5–5.1)
Sodium: 139 mEq/L (ref 135–145)
Total Bilirubin: 0.4 mg/dL (ref 0.3–1.2)
Total Protein: 6.7 g/dL (ref 6.0–8.3)

## 2012-11-02 LAB — GLUCOSE, CAPILLARY
Glucose-Capillary: 139 mg/dL — ABNORMAL HIGH (ref 70–99)
Glucose-Capillary: 148 mg/dL — ABNORMAL HIGH (ref 70–99)
Glucose-Capillary: 151 mg/dL — ABNORMAL HIGH (ref 70–99)
Glucose-Capillary: 198 mg/dL — ABNORMAL HIGH (ref 70–99)
Glucose-Capillary: 260 mg/dL — ABNORMAL HIGH (ref 70–99)
Glucose-Capillary: 262 mg/dL — ABNORMAL HIGH (ref 70–99)

## 2012-11-02 LAB — CBC WITH DIFFERENTIAL/PLATELET
Basophils Absolute: 0 10*3/uL (ref 0.0–0.1)
Basophils Relative: 0 % (ref 0–1)
Eosinophils Absolute: 0.1 10*3/uL (ref 0.0–0.7)
Eosinophils Relative: 2 % (ref 0–5)
HCT: 39 % (ref 39.0–52.0)
Hemoglobin: 13.9 g/dL (ref 13.0–17.0)
Lymphocytes Relative: 51 % — ABNORMAL HIGH (ref 12–46)
Lymphs Abs: 4.1 10*3/uL — ABNORMAL HIGH (ref 0.7–4.0)
MCH: 33.3 pg (ref 26.0–34.0)
MCHC: 35.6 g/dL (ref 30.0–36.0)
MCV: 93.3 fL (ref 78.0–100.0)
Monocytes Absolute: 0.5 10*3/uL (ref 0.1–1.0)
Monocytes Relative: 6 % (ref 3–12)
Neutro Abs: 3.2 10*3/uL (ref 1.7–7.7)
Neutrophils Relative %: 41 % — ABNORMAL LOW (ref 43–77)
Platelets: 159 10*3/uL (ref 150–400)
RBC: 4.18 MIL/uL — ABNORMAL LOW (ref 4.22–5.81)
RDW: 13 % (ref 11.5–15.5)
WBC: 8 10*3/uL (ref 4.0–10.5)

## 2012-11-02 LAB — PROTIME-INR
INR: 1.07 (ref 0.00–1.49)
Prothrombin Time: 13.7 seconds (ref 11.6–15.2)

## 2012-11-02 LAB — MAGNESIUM: Magnesium: 1.7 mg/dL (ref 1.5–2.5)

## 2012-11-02 LAB — HEPARIN LEVEL (UNFRACTIONATED): Heparin Unfractionated: 0.56 IU/mL (ref 0.30–0.70)

## 2012-11-02 MED ORDER — POTASSIUM CHLORIDE CRYS ER 20 MEQ PO TBCR
40.0000 meq | EXTENDED_RELEASE_TABLET | Freq: Four times a day (QID) | ORAL | Status: AC
  Administered 2012-11-02 (×2): 40 meq via ORAL
  Filled 2012-11-02 (×3): qty 2

## 2012-11-02 MED ORDER — POTASSIUM CHLORIDE CRYS ER 20 MEQ PO TBCR: 40.0000 meq | EXTENDED_RELEASE_TABLET | Freq: Once | ORAL | Status: DC

## 2012-11-02 MED ORDER — FUROSEMIDE 10 MG/ML IJ SOLN
20.0000 mg | Freq: Once | INTRAMUSCULAR | Status: AC
  Administered 2012-11-02: 20 mg via INTRAVENOUS
  Filled 2012-11-02: qty 2

## 2012-11-02 MED ORDER — MAGNESIUM SULFATE 40 MG/ML IJ SOLN
2.0000 g | Freq: Once | INTRAMUSCULAR | Status: AC
  Administered 2012-11-02: 2 g via INTRAVENOUS
  Filled 2012-11-02: qty 50

## 2012-11-02 MED ORDER — SODIUM CHLORIDE 0.9 % IV SOLN
INTRAVENOUS | Status: DC
  Administered 2012-11-02: 15:00:00 via INTRAVENOUS

## 2012-11-02 MED ORDER — PEG 3350-KCL-NA BICARB-NACL 420 G PO SOLR
4000.0000 mL | Freq: Once | ORAL | Status: AC
  Administered 2012-11-02: 4000 mL via ORAL
  Filled 2012-11-02: qty 4000

## 2012-11-02 NOTE — Progress Notes (Signed)
    Subjective:  Denies CP or dyspnea   Objective:  Filed Vitals:   11/02/12 0200 11/02/12 0400 11/02/12 0442 11/02/12 0600  BP: 116/64 105/75 91/72 92/74   Pulse: 70 66 103 77  Temp:   97.4 F (36.3 C)   TempSrc:   Axillary   Resp: 16 13 12 10   Height:      Weight:   336 lb 6.8 oz (152.6 kg)   SpO2: 95% 93% 96% 93%    Intake/Output from previous day:  Intake/Output Summary (Last 24 hours) at 11/02/12 0746 Last data filed at 11/02/12 0352  Gross per 24 hour  Intake    830 ml  Output   2952 ml  Net  -2122 ml    Physical Exam: Physical exam: Well-developed obese in no acute distress.  Skin is warm and dry.  HEENT is normal.  Neck is supple.  Chest is clear to auscultation with normal expansion.  Cardiovascular exam is tachycardic and irregular Abdominal exam nontender or distended. No masses palpated. Extremities show no edema. neuro grossly intact    Lab Results: Basic Metabolic Panel:  Recent Labs  11/01/12 0742 11/02/12 0405  NA 137 139  K 3.5 3.3*  CL 100 101  CO2 24 23  GLUCOSE 181* 151*  BUN 10 11  CREATININE 0.66 0.72  CALCIUM 8.9 8.8  MG 1.3* 1.7   CBC:  Recent Labs  11/01/12 0742 11/02/12 0405  WBC 7.0 8.0  NEUTROABS 3.4 3.2  HGB 13.1 13.9  HCT 38.5* 39.0  MCV 94.6 93.3  PLT 152 159   Cardiac Enzymes:  Recent Labs  10/31/12 2312 11/01/12 0659 11/01/12 1202  TROPONINI <0.30 <0.30 <0.30     Assessment/Plan:  1 Atrial fibrillation-the patient is in atrial fibrillation this morning. Plan continue Cardizem for control. His blood pressure is borderline. Discontinue lisinopril to allow advancement of AV nodal blocking agents. Continue heparin. Patient did not hold sinus rhythm with cardioversion. Note his TEE did not reveal left atrial appendage thrombus. I will most likely add tikosyn in AM and repeat cardioversion after fully loaded (need to supplement electrolytes prior to initiating). Patient is symptomatic with his atrial  fibrillation. 2 hematochezia-GI consult. He will most likely need colonoscopy. Hemoglobin stable. 3 hypertension-discontinue lisinopril to give additional room for advancing AV nodal blocking agents. 4 acute diastolic congestive heart failure-we'll give 1 dose of IV Lasix today. Most likely related to atrial fibrillation. 5 diabetes mellitus-follow CBGs.  6 elevated liver functions-possibly statin related. Will need followup as an outpatient. 7 hypokalemia/hypomagnesemia-supplement. Kirk Ruths 11/02/2012, 7:46 AM
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
## 2012-11-02 NOTE — Progress Notes (Signed)
ANTICOAGULATION CONSULT NOTE - Follow Up Consult  Pharmacy Consult for Heparin Indication: atrial fibrillation  Allergies  Allergen Reactions  . Iodinated Diagnostic Agents Anaphylaxis  . Sulfa Antibiotics Rash    Patient Measurements: Height: 5\' 11"  (180.3 cm) Weight: 336 lb 6.8 oz (152.6 kg) IBW/kg (Calculated) : 75.3 Heparin Dosing Weight: 110kg  Vital Signs: Temp: 97.8 F (36.6 C) (10/09 0831) Temp src: Axillary (10/09 0831) BP: 107/69 mmHg (10/09 0831) Pulse Rate: 93 (10/09 0831)  Labs:  Recent Labs  10/31/12 2312 11/01/12 0659 11/01/12 0742 11/01/12 1202 11/01/12 1834 11/02/12 0405  HGB  --   --  13.1  --   --  13.9  HCT  --   --  38.5*  --   --  39.0  PLT  --   --  152  --   --  159  LABPROT  --   --  13.7  --   --  13.7  INR  --   --  1.07  --   --  1.07  HEPARINUNFRC  --   --  0.10*  --  0.30 0.56  CREATININE 0.64  --  0.66  --   --  0.72  TROPONINI <0.30 <0.30  --  <0.30  --   --     Estimated Creatinine Clearance: 145.7 ml/min (by C-G formula based on Cr of 0.72).   Medications:  Heparin @ 2100 units/hr  Assessment: 61yom continues on heparin for afib s/p failed DCCV last evening. Heparin level is therapeutic. CBC is stable. Last BM 10/6.  Noted plan for Tikosyn initiation tomorrow.  Goal of Therapy:  Heparin level 0.3-0.7 units/ml Monitor platelets by anticoagulation protocol: Yes   Plan:  1) Continue heparin @ 2100 units/hr 2) Heparin level, CBC in AM 3) Follow up any BRBPR  Deboraha Sprang 11/02/2012,10:31 AM

## 2012-11-03 ENCOUNTER — Encounter (HOSPITAL_COMMUNITY): Payer: Self-pay | Admitting: *Deleted

## 2012-11-03 ENCOUNTER — Encounter (HOSPITAL_COMMUNITY)
Admission: AD | Disposition: A | Discharge status: Home / Self Care | Payer: Self-pay | Source: Other Acute Inpatient Hospital | Attending: Cardiology

## 2012-11-03 HISTORY — PX: COLONOSCOPY: SHX5424

## 2012-11-03 HISTORY — PX: ESOPHAGOGASTRODUODENOSCOPY: SHX5428

## 2012-11-03 LAB — COMPREHENSIVE METABOLIC PANEL
ALT: 78 U/L — ABNORMAL HIGH (ref 0–53)
AST: 68 U/L — ABNORMAL HIGH (ref 0–37)
Albumin: 3.9 g/dL (ref 3.5–5.2)
Alkaline Phosphatase: 47 U/L (ref 39–117)
BUN: 9 mg/dL (ref 6–23)
CO2: 24 mEq/L (ref 19–32)
Calcium: 8.7 mg/dL (ref 8.4–10.5)
Chloride: 99 mEq/L (ref 96–112)
Creatinine, Ser: 0.78 mg/dL (ref 0.50–1.35)
GFR calc Af Amer: >90 mL/min (ref >90)
GFR calc non Af Amer: >90 mL/min (ref >90)
Glucose, Bld: 177 mg/dL — ABNORMAL HIGH (ref 70–99)
Potassium: 3.7 mEq/L (ref 3.5–5.1)
Sodium: 136 mEq/L (ref 135–145)
Total Bilirubin: 0.6 mg/dL (ref 0.3–1.2)
Total Protein: 7.1 g/dL (ref 6.0–8.3)

## 2012-11-03 LAB — CBC WITH DIFFERENTIAL/PLATELET
Basophils Absolute: 0 10*3/uL (ref 0.0–0.1)
Basophils Relative: 0 % (ref 0–1)
Eosinophils Absolute: 0.1 10*3/uL (ref 0.0–0.7)
Eosinophils Relative: 2 % (ref 0–5)
HCT: 40 % (ref 39.0–52.0)
Hemoglobin: 14.1 g/dL (ref 13.0–17.0)
Lymphocytes Relative: 47 % — ABNORMAL HIGH (ref 12–46)
Lymphs Abs: 3.6 10*3/uL (ref 0.7–4.0)
MCH: 33.2 pg (ref 26.0–34.0)
MCHC: 35.3 g/dL (ref 30.0–36.0)
MCV: 94.1 fL (ref 78.0–100.0)
Monocytes Absolute: 0.6 10*3/uL (ref 0.1–1.0)
Monocytes Relative: 8 % (ref 3–12)
Neutro Abs: 3.3 10*3/uL (ref 1.7–7.7)
Neutrophils Relative %: 43 % (ref 43–77)
Platelets: 169 10*3/uL (ref 150–400)
RBC: 4.25 MIL/uL (ref 4.22–5.81)
RDW: 13.1 % (ref 11.5–15.5)
WBC: 7.6 10*3/uL (ref 4.0–10.5)

## 2012-11-03 LAB — GLUCOSE, CAPILLARY
Glucose-Capillary: 186 mg/dL — ABNORMAL HIGH (ref 70–99)
Glucose-Capillary: 166 mg/dL — ABNORMAL HIGH (ref 70–99)
Glucose-Capillary: 161 mg/dL — ABNORMAL HIGH (ref 70–99)
Glucose-Capillary: 314 mg/dL — ABNORMAL HIGH (ref 70–99)

## 2012-11-03 LAB — PROTIME-INR
INR: 1.07 (ref 0.00–1.49)
Prothrombin Time: 13.7 seconds (ref 11.6–15.2)

## 2012-11-03 LAB — MAGNESIUM: Magnesium: 1.8 mg/dL (ref 1.5–2.5)

## 2012-11-03 LAB — OCCULT BLOOD X 1 CARD TO LAB, STOOL: Fecal Occult Bld: NEGATIVE

## 2012-11-03 SURGERY — COLONOSCOPY
Anesthesia: Moderate Sedation

## 2012-11-03 MED ORDER — MAGNESIUM OXIDE 400 (241.3 MG) MG PO TABS
400.0000 mg | ORAL_TABLET | Freq: Two times a day (BID) | ORAL | Status: DC
  Administered 2012-11-04: 400 mg via ORAL
  Filled 2012-11-03 (×2): qty 1

## 2012-11-03 MED ORDER — DIPHENHYDRAMINE HCL 50 MG/ML IJ SOLN
INTRAMUSCULAR | Status: AC
  Filled 2012-11-03: qty 1

## 2012-11-03 MED ORDER — POTASSIUM CHLORIDE CRYS ER 20 MEQ PO TBCR
20.0000 meq | EXTENDED_RELEASE_TABLET | Freq: Every day | ORAL | Status: DC
  Administered 2012-11-04 – 2012-11-05 (×2): 20 meq via ORAL
  Filled 2012-11-03 (×4): qty 1

## 2012-11-03 MED ORDER — DIPHENHYDRAMINE HCL 50 MG/ML IJ SOLN
INTRAMUSCULAR | Status: DC | PRN
  Administered 2012-11-03: 25 mg via INTRAVENOUS

## 2012-11-03 MED ORDER — FENTANYL CITRATE 0.05 MG/ML IJ SOLN
INTRAMUSCULAR | Status: AC
  Filled 2012-11-03: qty 2

## 2012-11-03 MED ORDER — METOPROLOL TARTRATE 25 MG PO TABS
25.0000 mg | ORAL_TABLET | Freq: Two times a day (BID) | ORAL | Status: DC
  Administered 2012-11-03 – 2012-11-06 (×8): 25 mg via ORAL
  Filled 2012-11-03 (×10): qty 1

## 2012-11-03 MED ORDER — POTASSIUM CHLORIDE CRYS ER 20 MEQ PO TBCR
40.0000 meq | EXTENDED_RELEASE_TABLET | Freq: Once | ORAL | Status: AC
  Administered 2012-11-03: 40 meq via ORAL
  Filled 2012-11-03: qty 2

## 2012-11-03 MED ORDER — MIDAZOLAM HCL 5 MG/ML IJ SOLN
INTRAMUSCULAR | Status: AC
  Filled 2012-11-03: qty 2

## 2012-11-03 MED ORDER — BUTAMBEN-TETRACAINE-BENZOCAINE 2-2-14 % EX AERO
INHALATION_SPRAY | CUTANEOUS | Status: DC | PRN
  Administered 2012-11-03: 2 via TOPICAL

## 2012-11-03 MED ORDER — APIXABAN 5 MG PO TABS
5.0000 mg | ORAL_TABLET | Freq: Two times a day (BID) | ORAL | Status: DC
  Administered 2012-11-03 – 2012-11-10 (×14): 5 mg via ORAL
  Filled 2012-11-03 (×18): qty 1

## 2012-11-03 MED ORDER — FENTANYL CITRATE 0.05 MG/ML IJ SOLN
INTRAMUSCULAR | Status: DC | PRN
  Administered 2012-11-03 (×3): 25 ug via INTRAVENOUS

## 2012-11-03 MED ORDER — MAGNESIUM SULFATE 40 MG/ML IJ SOLN
2.0000 g | Freq: Once | INTRAMUSCULAR | Status: AC
  Administered 2012-11-03: 2 g via INTRAVENOUS
  Filled 2012-11-03: qty 50

## 2012-11-03 MED ORDER — HEPARIN (PORCINE) IN NACL 100-0.45 UNIT/ML-% IJ SOLN
2000.0000 [IU]/h | INTRAMUSCULAR | Status: AC
  Administered 2012-11-03: 2000 [IU]/h via INTRAVENOUS
  Filled 2012-11-03 (×2): qty 250

## 2012-11-03 MED ORDER — MAGNESIUM OXIDE 400 (241.3 MG) MG PO TABS: 400.0000 mg | ORAL_TABLET | Freq: Every day | ORAL | Status: DC

## 2012-11-03 MED ORDER — MIDAZOLAM HCL 10 MG/2ML IJ SOLN
INTRAMUSCULAR | Status: DC | PRN
  Administered 2012-11-03 (×3): 2.5 mg via INTRAVENOUS

## 2012-11-03 NOTE — Progress Notes (Signed)
0915  During time for pre procedure patient noted to have heparin running from floor . Procedure held at this time heparin stopped . Procedure rescheduled  @1315  today.

## 2012-11-03 NOTE — Progress Notes (Signed)
    Subjective:  Denies CP or dyspnea   Objective:  Filed Vitals:   11/03/12 0358 11/03/12 0405 11/03/12 0500 11/03/12 0600  BP:  136/103  104/43  Pulse:  74  126  Temp: 97.9 F (36.6 C)     TempSrc: Oral     Resp:  20  19  Height:      Weight:   331 lb 9.2 oz (150.4 kg)   SpO2:  98%  95%    Intake/Output from previous day:  Intake/Output Summary (Last 24 hours) at 11/03/12 0754 Last data filed at 11/02/12 2356  Gross per 24 hour  Intake 966.33 ml  Output   3250 ml  Net -2283.67 ml    Physical Exam: Physical exam: Well-developed obese in no acute distress.  Skin is warm and dry.  HEENT is normal.  Neck is supple.  Chest is clear to auscultation with normal expansion.  Cardiovascular exam is tachycardic and irregular Abdominal exam nontender or distended. No masses palpated. Extremities show no edema. neuro grossly intact    Lab Results: Basic Metabolic Panel:  Recent Labs  11/02/12 0405 11/03/12 0415  NA 139 136  K 3.3* 3.7  CL 101 99  CO2 23 24  GLUCOSE 151* 177*  BUN 11 9  CREATININE 0.72 0.78  CALCIUM 8.8 8.7  MG 1.7 1.8   CBC:  Recent Labs  11/02/12 0405 11/03/12 0415  WBC 8.0 7.6  NEUTROABS 3.2 3.3  HGB 13.9 14.1  HCT 39.0 40.0  MCV 93.3 94.1  PLT 159 169   Cardiac Enzymes:  Recent Labs  10/31/12 2312 11/01/12 0659 11/01/12 1202  TROPONINI <0.30 <0.30 <0.30     Assessment/Plan:  1 Atrial fibrillation-the patient remains in atrial fibrillation this morning; rate elevated. Plan continue Cardizem and add lopressor for rate control. Continue heparin. Patient did not hold sinus rhythm with cardioversion. Note his TEE did not reveal left atrial appendage thrombus. Once GI eval complete and it is clear he does not need any intervention for recent hematocheezia, I will add tikosyn and repeat cardioversion after fully loaded. Patient is symptomatic with his atrial fibrillation. Supplement electrolytes. 2 hematochezia-GI has evaluated  and plans EGD and colonoscopy today. Hemoglobin stable. 3 hypertension-monitor 4 acute diastolic congestive heart failure-improved. Most likely related to atrial fibrillation. 5 diabetes mellitus-follow CBGs.  6 elevated liver functions-possibly statin related. Will need followup as an outpatient. 7 hypokalemia/hypomagnesemia-supplement. Kirk Ruths 11/03/2012, 7:54 AM

## 2012-11-03 NOTE — Progress Notes (Addendum)
Discussed rhythm therapy with Dr. Stanford Breed. I handcalculated his QTc on this morning's EKG at 435ms. Prior QTc on EKG when on sinus was 478ms. He does not have an IVCD thus is not a candidate for Tikosyn therapy. He is not a candidate due to history of CAD (see prior cath report). We were going to then use amiodarone but the patient has h/o anaphylactic allergy to iodninated diagnostic agents. I confirmed with pharmacy that this is a significant allergic risk and they feel we should not use it. Per discussion with Dr. Stanford Breed, will continue rate control (metoprolol) and follow symptoms. If still symptomatic over the weekend, will ask EP to see regarding other antiarrhythmic options (?sotalol) vs ablation. Giovani Neumeister PA-C  Per discussion will also start apixaban - will order per pharmacy so they can transition heparin to apixaban. Tashi Band PA-C

## 2012-11-03 NOTE — Progress Notes (Addendum)
ANTICOAGULATION CONSULT NOTE - Follow Up Consult  Pharmacy Consult for Heparin Indication: atrial fibrillation  Allergies  Allergen Reactions  . Iodinated Diagnostic Agents Anaphylaxis  . Sulfa Antibiotics Rash   Patient Measurements: Height: 5\' 11"  (180.3 cm) Weight: 331 lb 9.2 oz (150.4 kg) IBW/kg (Calculated) : 75.3 Heparin Dosing Weight: 110kg  Vital Signs: Temp: 97.7 F (36.5 C) (10/10 1312) Temp src: Oral (10/10 1312) BP: 127/76 mmHg (10/10 1500) Pulse Rate: 51 (10/10 1500)  Labs:  Recent Labs  10/31/12 2312 11/01/12 0659  11/01/12 0742 11/01/12 1202 11/01/12 1834 11/02/12 0405 11/03/12 0415  HGB  --   --   < > 13.1  --   --  13.9 14.1  HCT  --   --   --  38.5*  --   --  39.0 40.0  PLT  --   --   --  152  --   --  159 169  LABPROT  --   --   --  13.7  --   --  13.7 13.7  INR  --   --   --  1.07  --   --  1.07 1.07  HEPARINUNFRC  --   --   --  0.10*  --  0.30 0.56  --   CREATININE 0.64  --   --  0.66  --   --  0.72 0.78  TROPONINI <0.30 <0.30  --   --  <0.30  --   --   --   < > = values in this interval not displayed.  Estimated Creatinine Clearance: 144.4 ml/min (by C-G formula based on Cr of 0.78).  Medications:  Heparin @ 2000 units/hr  Assessment: 61yom continues on heparin for afib s/p failed DCCV last evening. Heparin level was therapeutic at 0.56IU/ml this morning.  He is now s/p endoscopy for possible GI bleed.  Per Dr. Penelope Coop, he has no noted active lower GI bleeding and the bleeding described by the patient may be due to hemorrhoids.  He also notes some likely Barrett's esophagus but no biopsies were obtained.  CBC is stable and we will resume his IV heparin at a slightly lower rate since he just had a procedure.  Will monitor therapy by checking an 8 hour level to ensure a therapeutic response.  Goal of Therapy:  Heparin level 0.3-0.7 units/ml Monitor platelets by anticoagulation protocol: Yes   Plan:  1) Restart IV heparin @ 2000 units/hr 2)  Heparin level in 8 hours and then daily, CBC in AM 3) Follow up any BRBPR  Rober Minion, PharmD., MS Clinical Pharmacist Pager:  [phone] Thank you for allowing pharmacy to be part of this patients care team. 11/03/2012,4:14 PM   Addendum:  Now plans to start patient on Apixaban.  Will transition off of IV heparin.    Contraindications: CrCl < 61ml/min or HD - patient has normal renal fxn.  Moderate/Severe hepatic disease - patient without hepatic disease Drug/Drug Interactions with CYP3A4 Inhibitors - profile reviewed no interacting meds (Ketoconazole, Ritonavir, Carbamazepine, Phenytoin etc)  Plan:  Will start Apixaban 5 mg twice daily.  Expected half life will be ~ 12 hours with time to peak around 3-4 hours, therefore, we will go ahead and stop the IV heparin with the first dose of Apixaban.  Check CBC and Creatinine every 72 hours for now.   Rober Minion, PharmD., MS Clinical Pharmacist Pager:  [phone] Thank you for allowing pharmacy to be part of this patients care team.

## 2012-11-03 NOTE — Op Note (Signed)
[facility] [place], [id number]   ENDOSCOPY PROCEDURE REPORT  PATIENT: Lawrence, Jordan.  MR#: [id number] BIRTHDATE: Aug 13, 1950 , 61  yrs. old GENDER: Male ENDOSCOPIST: Acquanetta Sit, MD REFERRED BY: PROCEDURE DATE:  11/03/2012 PROCEDURE:   EGD ASA CLASS: 3 INDICATIONS: heartburn MEDICATIONS: Benadryl 25 g IV fentanyl 50 mcg IV, Versed 5 mg IV TOPICAL ANESTHETIC:  DESCRIPTION OF PROCEDURE:   After the risks benefits and alternatives of the procedure were thoroughly explained, informed consent was obtained.  The Pentax       endoscope was introduced through the mouth and advanced to the second portion of the duodenum      , limited by Without limitations.   The instrument was slowly withdrawn as the mucosa was fully examined.      FINDINGS:  Esophagus: There does appear to be evidence of Barrett's esophagus starting at approximately 28 cm from the incisor teeth and extending down to the stomach.  This is a long segment of Barrett's.  There are no ulcers there are no tumors.  There is nothing to suggest bleeding or stigmata of bleeding.  Biopsies were not obtained at this time because multiple biopsies will be required for histological confirmation and at this time he needs to go back on his heparin drip and be anticoagulated due to his cardiac situation.  Stomach: Normal.  Duodenum: Normal  COMPLICATIONS:  ENDOSCOPIC IMPRESSION:Barrett's esophagus   RECOMMENDATIONS:PPI therapy.  There is nothing on this exam that I see that would prevent him from being anticoagulated due to his atrial fibrillation.  The Barrett's esophagus biopsies can be addressed at another time.      _______________________________ Lorrin MaisAcquanetta Sit, MD 11/03/2012 2:17 PM

## 2012-11-03 NOTE — Op Note (Signed)
[facility] [name], [id number]   COLONOSCOPY PROCEDURE REPORT  PATIENT: Lawrence Jordan, [name].  MR#: [id number] BIRTHDATE: 08/04/1950 , 61  yrs. old GENDER: Male ENDOSCOPIST: Acquanetta Sit, MD REFERRED BY: PROCEDURE DATE:  11/03/2012 PROCEDURE:   colonoscopy ASA CLASS:   3 INDICATIONS:rectal bleeding MEDICATIONS: an additional dose of fentanyl 25 mcg IV and Versed 2.5 mg IV were given for the colonoscopy which was done immediately after the EGD  DESCRIPTION OF PROCEDURE:   After the risks benefits and alternatives of the procedure were thoroughly explained, informed consent was obtained.  inspection of the perianal area revealed multiple perianal papules, skin tags, or papilloma.  External hemorrhoidal tags were seen.        The Pentax Adult Colon [id number] endoscope was introduced through the anus and advanced to the cecum     . No adverse events experienced.   The quality of the prep was adequate       The instrument was then slowly withdrawn as the colon was fully examined.  The cecum and descending colon were normal.  The transverse colon was normal.  The descending colon, sigmoid and rectum were normal. Internal hemorrhoids were seen on retroflexion.    The time to cecum=  .  Withdrawal time=  .  The scope was withdrawn and the procedure completed. COMPLICATIONS: There were no complications.  ENDOSCOPIC IMPRESSION:#1.  Internal hemorrhoids  #2.  External hemorrhoids  #3.  Inspection of the perianal area revealed multiple perianal papules, skin tags, or papillomas.  RECOMMENDATIONS: I see nothing on this examination that I would worry about bleeding if he were anticoagulated.  I think that the bleeding that he is describing per rectum is hemorrhoidal in nature.  I would recommend that after discharge from the hospital he sees a surgeon in regards to the perianal lesions.  I would follow him up in the office in regards to his  Barrett's esophagus and ongoing treatment and followup.  eSigned:  Acquanetta Sit, MD 11/03/2012 2:23 PM   cc:

## 2012-11-04 LAB — COMPREHENSIVE METABOLIC PANEL
ALT: 73 U/L — ABNORMAL HIGH (ref 0–53)
AST: 55 U/L — ABNORMAL HIGH (ref 0–37)
Albumin: 3.9 g/dL (ref 3.5–5.2)
Alkaline Phosphatase: 51 U/L (ref 39–117)
BUN: 8 mg/dL (ref 6–23)
CO2: 23 mEq/L (ref 19–32)
Calcium: 9.2 mg/dL (ref 8.4–10.5)
Chloride: 102 mEq/L (ref 96–112)
Creatinine, Ser: 0.74 mg/dL (ref 0.50–1.35)
GFR calc Af Amer: >90 mL/min (ref >90)
GFR calc non Af Amer: >90 mL/min (ref >90)
Glucose, Bld: 161 mg/dL — ABNORMAL HIGH (ref 70–99)
Potassium: 4 mEq/L (ref 3.5–5.1)
Sodium: 137 mEq/L (ref 135–145)
Total Bilirubin: 0.4 mg/dL (ref 0.3–1.2)
Total Protein: 7.3 g/dL (ref 6.0–8.3)

## 2012-11-04 LAB — GLUCOSE, CAPILLARY
Glucose-Capillary: 152 mg/dL — ABNORMAL HIGH (ref 70–99)
Glucose-Capillary: 177 mg/dL — ABNORMAL HIGH (ref 70–99)
Glucose-Capillary: 178 mg/dL — ABNORMAL HIGH (ref 70–99)
Glucose-Capillary: 286 mg/dL — ABNORMAL HIGH (ref 70–99)
Glucose-Capillary: 331 mg/dL — ABNORMAL HIGH (ref 70–99)
Glucose-Capillary: 342 mg/dL — ABNORMAL HIGH (ref 70–99)

## 2012-11-04 LAB — CBC WITH DIFFERENTIAL/PLATELET
Basophils Absolute: 0 10*3/uL (ref 0.0–0.1)
Basophils Relative: 0 % (ref 0–1)
Eosinophils Absolute: 0.1 10*3/uL (ref 0.0–0.7)
Eosinophils Relative: 2 % (ref 0–5)
HCT: 41.4 % (ref 39.0–52.0)
Hemoglobin: 14.7 g/dL (ref 13.0–17.0)
Lymphocytes Relative: 42 % (ref 12–46)
Lymphs Abs: 2.9 10*3/uL (ref 0.7–4.0)
MCH: 33.3 pg (ref 26.0–34.0)
MCHC: 35.5 g/dL (ref 30.0–36.0)
MCV: 93.7 fL (ref 78.0–100.0)
Monocytes Absolute: 0.6 10*3/uL (ref 0.1–1.0)
Monocytes Relative: 8 % (ref 3–12)
Neutro Abs: 3.3 10*3/uL (ref 1.7–7.7)
Neutrophils Relative %: 48 % (ref 43–77)
Platelets: 171 10*3/uL (ref 150–400)
RBC: 4.42 MIL/uL (ref 4.22–5.81)
RDW: 13.1 % (ref 11.5–15.5)
WBC: 6.9 10*3/uL (ref 4.0–10.5)

## 2012-11-04 LAB — MAGNESIUM: Magnesium: 1.8 mg/dL (ref 1.5–2.5)

## 2012-11-04 LAB — PROTIME-INR
INR: 1.09 (ref 0.00–1.49)
Prothrombin Time: 13.9 seconds (ref 11.6–15.2)

## 2012-11-04 MED ORDER — MAGNESIUM SULFATE 40 MG/ML IJ SOLN
2.0000 g | Freq: Once | INTRAMUSCULAR | Status: AC
  Administered 2012-11-04: 2 g via INTRAVENOUS
  Filled 2012-11-04: qty 50

## 2012-11-04 NOTE — Progress Notes (Signed)
QTc is about 480 by Bazetts will reassess following infusion of mag
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
## 2012-11-04 NOTE — Progress Notes (Signed)
   CARE MANAGEMENT NOTE 11/04/2012  Patient:  Lawrence Jordan, Lawrence Jordan   Account Number:  [id number]  Date Initiated:  11/04/2012  Documentation initiated by:  [facility]  Subjective/Objective Assessment:   [id number] CAD DM HTN and undiagnosed OSA admitted with CHF     Action/Plan:   discharge planning   Anticipated DC Date:  11/05/2012   Anticipated DC Plan:  [place]  CM consult      Choice offered to / List presented to:             Status of service:  Completed, signed off Medicare Important Message given?   (If response is "NO", the following Medicare IM given date fields will be blank) Date Medicare IM given:   Date Additional Medicare IM given:    Discharge Disposition:  HOME/SELF CARE  Per UR Regulation:    If discussed at Long Length of Stay Meetings, dates discussed:    Comments:  11/04/12 17:48 CM met pt in room with Eliquis packet with both the 30 day free trial card and the $10 copay card.  Pt and pt's wife stated they were both capable of activating the cards and understood the terms and restrictions.  No other CM needs were communicated.  Mariane Masters, BSN, CM [phone].

## 2012-11-04 NOTE — Progress Notes (Signed)
The patient was seen today and I explained the findings from his EGD and colonoscopy. It looks like he has Barrett's esophagus and I explained this to him. I recommended PPI therapy. I also explained the findings from the colonoscopy. I recommended that when he is discharged from the hospital that he make a followup appointment to see me in the office, and he is agreeable. We will sign off. Call us again if needed.

## 2012-11-05 LAB — BASIC METABOLIC PANEL
BUN: 10 mg/dL (ref 6–23)
CO2: 20 mEq/L (ref 19–32)
Calcium: 9.1 mg/dL (ref 8.4–10.5)
Chloride: 101 mEq/L (ref 96–112)
Creatinine, Ser: 0.72 mg/dL (ref 0.50–1.35)
GFR calc Af Amer: >90 mL/min (ref >90)
GFR calc non Af Amer: >90 mL/min (ref >90)
Glucose, Bld: 289 mg/dL — ABNORMAL HIGH (ref 70–99)
Potassium: 4.5 mEq/L (ref 3.5–5.1)
Sodium: 136 mEq/L (ref 135–145)

## 2012-11-05 LAB — CBC WITH DIFFERENTIAL/PLATELET
Basophils Absolute: 0 10*3/uL (ref 0.0–0.1)
Basophils Relative: 0 % (ref 0–1)
Eosinophils Absolute: 0.2 10*3/uL (ref 0.0–0.7)
Eosinophils Relative: 2 % (ref 0–5)
HCT: 41 % (ref 39.0–52.0)
Hemoglobin: 14.6 g/dL (ref 13.0–17.0)
Lymphocytes Relative: 38 % (ref 12–46)
Lymphs Abs: 3 10*3/uL (ref 0.7–4.0)
MCH: 33.1 pg (ref 26.0–34.0)
MCHC: 35.6 g/dL (ref 30.0–36.0)
MCV: 93 fL (ref 78.0–100.0)
Monocytes Absolute: 0.8 10*3/uL (ref 0.1–1.0)
Monocytes Relative: 11 % (ref 3–12)
Neutro Abs: 3.8 10*3/uL (ref 1.7–7.7)
Neutrophils Relative %: 49 % (ref 43–77)
Platelets: 168 10*3/uL (ref 150–400)
RBC: 4.41 MIL/uL (ref 4.22–5.81)
RDW: 12.9 % (ref 11.5–15.5)
WBC: 7.9 10*3/uL (ref 4.0–10.5)

## 2012-11-05 LAB — GLUCOSE, CAPILLARY
Glucose-Capillary: 189 mg/dL — ABNORMAL HIGH (ref 70–99)
Glucose-Capillary: 173 mg/dL — ABNORMAL HIGH (ref 70–99)
Glucose-Capillary: 202 mg/dL — ABNORMAL HIGH (ref 70–99)
Glucose-Capillary: 315 mg/dL — ABNORMAL HIGH (ref 70–99)
Glucose-Capillary: 255 mg/dL — ABNORMAL HIGH (ref 70–99)
Glucose-Capillary: 297 mg/dL — ABNORMAL HIGH (ref 70–99)

## 2012-11-05 LAB — COMPREHENSIVE METABOLIC PANEL
ALT: 64 U/L — ABNORMAL HIGH (ref 0–53)
AST: 39 U/L — ABNORMAL HIGH (ref 0–37)
Albumin: 3.8 g/dL (ref 3.5–5.2)
Alkaline Phosphatase: 54 U/L (ref 39–117)
BUN: 9 mg/dL (ref 6–23)
CO2: 23 mEq/L (ref 19–32)
Calcium: 8.9 mg/dL (ref 8.4–10.5)
Chloride: 101 mEq/L (ref 96–112)
Creatinine, Ser: 0.77 mg/dL (ref 0.50–1.35)
GFR calc Af Amer: >90 mL/min (ref >90)
GFR calc non Af Amer: >90 mL/min (ref >90)
Glucose, Bld: 179 mg/dL — ABNORMAL HIGH (ref 70–99)
Potassium: 4.1 mEq/L (ref 3.5–5.1)
Sodium: 136 mEq/L (ref 135–145)
Total Bilirubin: 0.4 mg/dL (ref 0.3–1.2)
Total Protein: 7 g/dL (ref 6.0–8.3)

## 2012-11-05 LAB — PROTIME-INR
INR: 1.1 (ref 0.00–1.49)
Prothrombin Time: 14 seconds (ref 11.6–15.2)

## 2012-11-05 LAB — MAGNESIUM
Magnesium: 1.9 mg/dL (ref 1.5–2.5)
Magnesium: 1.7 mg/dL (ref 1.5–2.5)

## 2012-11-05 MED ORDER — SODIUM CHLORIDE 0.9 % IV SOLN
250.0000 mL | INTRAVENOUS | Status: DC | PRN
  Administered 2012-11-08: 09:00:00 via INTRAVENOUS

## 2012-11-05 MED ORDER — SODIUM CHLORIDE 0.9 % IJ SOLN
3.0000 mL | Freq: Two times a day (BID) | INTRAMUSCULAR | Status: DC
  Administered 2012-11-05 – 2012-11-06 (×4): 3 mL via INTRAVENOUS
  Administered 2012-11-07: 21:00:00 via INTRAVENOUS
  Administered 2012-11-08 – 2012-11-10 (×5): 3 mL via INTRAVENOUS

## 2012-11-05 MED ORDER — SODIUM CHLORIDE 0.9 % IJ SOLN: 3.0000 mL | INTRAMUSCULAR | Status: DC | PRN

## 2012-11-05 MED ORDER — DOFETILIDE 500 MCG PO CAPS
500.0000 ug | ORAL_CAPSULE | Freq: Two times a day (BID) | ORAL | Status: DC
  Administered 2012-11-05 (×2): 500 ug via ORAL
  Filled 2012-11-05 (×4): qty 1

## 2012-11-05 NOTE — Progress Notes (Signed)
Pharmacy Consult for Dofetilide (Tikosyn) Iniation  Admit Complaint: 62 y.o. male admitted 10/31/2012 with atrial fibrillation to be initiated on dofetilide.   Assessment:  Patient Exclusion Criteria: If any screening criteria checked as "Yes", then  patient  should NOT receive dofetilide until criteria item is corrected. If "Yes" please indicate correction plan.  YES  NO Patient  Exclusion Criteria Correction Plan  []  [x]  Baseline QTc interval is greater than or equal to 440 msec. IF above YES box checked dofetilide contraindicated unless patient has ICD; then may proceed if QTc 500-550 msec or with known ventricular conduction abnormalities may proceed with QTc 550-600 msec. QTc = 0.43   []  [x]  Magnesium level is less than 1.8 mEq/l : Last magnesium:  Lab Results  Component Value Date   MG 1.9 11/05/2012         []  [x]  Potassium level is less than 4 mEq/l : Last potassium:  Lab Results  Component Value Date   K 4.1 11/05/2012         []  [x]  Patient is known or suspected to have a digoxin level greater than 2 ng/ml: No results found for this basename: DIGOXIN      []  [x]  Creatinine clearance less than 20 ml/min (calculated using Cockcroft-Gault, actual body weight and serum creatinine): Estimated Creatinine Clearance: 143.5 ml/min (by C-G formula based on Cr of 0.77).    []  [x]  Patient has received drugs known to prolong the QT intervals within the last 48 hours(phenothiazines, tricyclics or tetracyclic antidepressants, erythromycin, H-1 antihistamines, cisapride, fluoroquinolones, azithromycin). Drugs not listed above may have an, as yet, undetected potential to prolong the QT interval, updated information on QT prolonging agents is available at this website:QT prolonging agents   []  [x]  Patient received a dose of hydrochlorothiazide (Oretic) alone or in any combination including triamterene (Dyazide, Maxzide) in the last 48 hours.   []  [x]  Patient received a medication known to  increase dofetilide plasma concentrations prior to initial dofetilide dose:    Trimethoprim (Primsol, Proloprim) in the last 36 hours   Verapamil (Calan, Verelan) in the last 36 hours or a sustained release dose in the last 72 hours   Megestrol (Megace) in the last 5 days    Cimetidine (Tagamet) in the last 6 hours   Ketoconazole (Nizoral) in the last 24 hours   Itraconazole (Sporanox) in the last 48 hours    Prochlorperazine (Compazine) in the last 36 hours    []  [x]  Patient is known to have a history of torsades de pointes; congenital or acquired long QT syndromes.   []  [x]  Patient has received a Class 1 antiarrhythmic with less than 2 half-lives since last dose. (Disopyramide, Quinidine, Procainamide, Lidocaine, Mexiletine, Flecainide, Propafenone)   []  [x]  Patient has received amiodarone therapy in the past 3 months or amiodarone level is greater than 0.3 ng/ml.    Patient has been appropriately anticoagulated with [place] provider was confirmed at [email or web address] if they are not listed on the [place] Prescribers list.  Goal of Therapy:  Follow renal function, electrolytes, potential drug interactions, and dose adjustment. Provide education and 1 week supply at discharge.  Plan:  1.  Initiate dofetilide based on renal function: Select One Calculated CrCl  Dose q12h  [x]  > 60 ml/min 500 mcg  []  40-60 ml/min 250 mcg  []  20-40 ml/min 125 mcg   2. Follow up QTc after the first 5 doses, renal function, electrolytes (K & Mg) daily x 3  days, dose adjustment, success of initiation and facilitate 1 week discharge supply as     clinically indicated.  3. Initiate Tikosyn education video (Call [phone] and ask for video # [phone]).  4. Place Enrollment Form on the chart for discharge supply of dofetilide.  [place] Crane, Pharm D, BCPS 11/05/2012 10:41 AM

## 2012-11-05 NOTE — Progress Notes (Signed)
Rhonda Barrett PA notified pt converted to SB; pt HR 56; Cardizem gtt discontinued, no other med changes at this time. Will continue to monitor pt. Teresita Madura RN
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
## 2012-11-06 ENCOUNTER — Encounter (HOSPITAL_COMMUNITY): Payer: Self-pay | Admitting: Gastroenterology

## 2012-11-06 LAB — GLUCOSE, CAPILLARY
Glucose-Capillary: 171 mg/dL — ABNORMAL HIGH (ref 70–99)
Glucose-Capillary: 175 mg/dL — ABNORMAL HIGH (ref 70–99)
Glucose-Capillary: 287 mg/dL — ABNORMAL HIGH (ref 70–99)
Glucose-Capillary: 261 mg/dL — ABNORMAL HIGH (ref 70–99)
Glucose-Capillary: 249 mg/dL — ABNORMAL HIGH (ref 70–99)
Glucose-Capillary: 312 mg/dL — ABNORMAL HIGH (ref 70–99)

## 2012-11-06 LAB — CBC WITH DIFFERENTIAL/PLATELET
Basophils Absolute: 0 10*3/uL (ref 0.0–0.1)
Basophils Relative: 0 % (ref 0–1)
Eosinophils Absolute: 0.2 10*3/uL (ref 0.0–0.7)
Eosinophils Relative: 3 % (ref 0–5)
HCT: 38.3 % — ABNORMAL LOW (ref 39.0–52.0)
Hemoglobin: 13.6 g/dL (ref 13.0–17.0)
Lymphocytes Relative: 41 % (ref 12–46)
Lymphs Abs: 2.9 10*3/uL (ref 0.7–4.0)
MCH: 33.6 pg (ref 26.0–34.0)
MCHC: 35.5 g/dL (ref 30.0–36.0)
MCV: 94.6 fL (ref 78.0–100.0)
Monocytes Absolute: 0.5 10*3/uL (ref 0.1–1.0)
Monocytes Relative: 8 % (ref 3–12)
Neutro Abs: 3.4 10*3/uL (ref 1.7–7.7)
Neutrophils Relative %: 48 % (ref 43–77)
Platelets: 161 10*3/uL (ref 150–400)
RBC: 4.05 MIL/uL — ABNORMAL LOW (ref 4.22–5.81)
RDW: 13.1 % (ref 11.5–15.5)
WBC: 7.1 10*3/uL (ref 4.0–10.5)

## 2012-11-06 LAB — COMPREHENSIVE METABOLIC PANEL
ALT: 66 U/L — ABNORMAL HIGH (ref 0–53)
AST: 43 U/L — ABNORMAL HIGH (ref 0–37)
Albumin: 3.7 g/dL (ref 3.5–5.2)
Alkaline Phosphatase: 56 U/L (ref 39–117)
BUN: 10 mg/dL (ref 6–23)
CO2: 25 mEq/L (ref 19–32)
Calcium: 9 mg/dL (ref 8.4–10.5)
Chloride: 100 mEq/L (ref 96–112)
Creatinine, Ser: 0.79 mg/dL (ref 0.50–1.35)
GFR calc Af Amer: >90 mL/min (ref >90)
GFR calc non Af Amer: >90 mL/min (ref >90)
Glucose, Bld: 160 mg/dL — ABNORMAL HIGH (ref 70–99)
Potassium: 3.5 mEq/L (ref 3.5–5.1)
Sodium: 137 mEq/L (ref 135–145)
Total Bilirubin: 0.4 mg/dL (ref 0.3–1.2)
Total Protein: 7 g/dL (ref 6.0–8.3)

## 2012-11-06 LAB — TROPONIN I
Troponin I: <0.3 ng/mL (ref <0.30)
Troponin I: <0.3 ng/mL (ref <0.30)

## 2012-11-06 LAB — MAGNESIUM: Magnesium: 1.7 mg/dL (ref 1.5–2.5)

## 2012-11-06 LAB — PROTIME-INR
INR: 1.05 (ref 0.00–1.49)
Prothrombin Time: 13.5 seconds (ref 11.6–15.2)

## 2012-11-06 MED ORDER — MAGNESIUM OXIDE 400 (241.3 MG) MG PO TABS
400.0000 mg | ORAL_TABLET | Freq: Two times a day (BID) | ORAL | Status: DC
  Administered 2012-11-06 – 2012-11-10 (×9): 400 mg via ORAL
  Filled 2012-11-06 (×10): qty 1

## 2012-11-06 MED ORDER — POTASSIUM CHLORIDE CRYS ER 20 MEQ PO TBCR
40.0000 meq | EXTENDED_RELEASE_TABLET | Freq: Once | ORAL | Status: AC
  Administered 2012-11-06: 40 meq via ORAL
  Filled 2012-11-06: qty 2

## 2012-11-06 MED ORDER — NITROGLYCERIN 0.4 MG SL SUBL
SUBLINGUAL_TABLET | SUBLINGUAL | Status: AC
  Administered 2012-11-06: 0.4 mg
  Filled 2012-11-06: qty 62.5

## 2012-11-06 MED ORDER — DOFETILIDE 250 MCG PO CAPS
250.0000 ug | ORAL_CAPSULE | Freq: Two times a day (BID) | ORAL | Status: DC
  Administered 2012-11-06 (×2): 250 ug via ORAL
  Filled 2012-11-06 (×4): qty 1

## 2012-11-06 NOTE — Care Management Note (Signed)
    Page 1 of 1   11/06/2012     10:11:55 AM   CARE MANAGEMENT NOTE 11/06/2012  Patient:  Lawrence Jordan, Lawrence Jordan   Account Number:  [id number]  Date Initiated:  11/04/2012  Documentation initiated by:  [facility]  Subjective/Objective Assessment:   [id number] CAD DM HTN and undiagnosed OSA admitted with CHF     DC Planning Services  CM consult      Comments:  11/06/12 [place] Received referral to determine Tikosyn copay - per [employer], copay will be $75 for a 30 day supply.  11/04/12 17:48 CM met pt in room with Eliquis packet with both the 30 day free trial card and the $10 copay card.  Pt and pt's wife stated they were both capable of activating the cards and understood the terms and restrictions.  No other CM needs were communicated.  Mariane Masters, BSN, CM [phone].

## 2012-11-06 NOTE — Progress Notes (Signed)
Patient c/o chest tightness for approximately 30 mins. SR 62, 100% RA, RR 20 , BP 124/89. Dayna Dunn PA-C  made aware. Orders for EKG and PA is to come to unit to assess.

## 2012-11-06 NOTE — Progress Notes (Addendum)
Dr. Stanford Breed made aware of patient's QTc of 514, who asked that I speak with Dr. Caryl Comes regarding dose adjustment versus discontinuation. Dr. Caryl Comes recommended dropping dose to 246mcg BID. He will further review dosing guidelines and advise further. If QTc remains an issue may need to change to amiodarone (see prior notes regarding discussion about iodine allergy). Lawrence Capaldi PA-C  Dr. Caryl Comes reviewed information regarding recommendation to discontinue Tikosyn if QTc>558ms but would still prefer to decrease dose and monitor closely. He recommends 72 hour observation. Lawrence Briel PA-C

## 2012-11-06 NOTE — Progress Notes (Signed)
ANTICOAGULATION CONSULT NOTE - Follow Up Consult  Pharmacy Consult for eliquis, Tikosyn Indication: atrial fibrillation  Allergies  Allergen Reactions  . Iodinated Diagnostic Agents Anaphylaxis  . Sulfa Antibiotics Rash    Patient Measurements: Height: 5\' 11"  (180.3 cm) Weight: 326 lb 11.6 oz (148.2 kg) (standing scale) IBW/kg (Calculated) : 75.3 Heparin Dosing Weight:    Vital Signs: Temp: 97.8 F (36.6 C) (10/13 0759) Temp src: Oral (10/13 0759) BP: 134/86 mmHg (10/13 0936) Pulse Rate: 75 (10/13 0936)  Labs:  Recent Labs  11/04/12 0500 11/05/12 0350 11/05/12 1240 11/06/12 0546  HGB 14.7 14.6  --  13.6  HCT 41.4 41.0  --  38.3*  PLT 171 168  --  161  LABPROT 13.9 14.0  --  13.5  INR 1.09 1.10  --  1.05  CREATININE 0.74 0.77 0.72 0.79    Estimated Creatinine Clearance: 143.3 ml/min (by C-G formula based on Cr of 0.79).   Assessment: Admit Complaint: Afib  AC: Pt now on apixaban for afib.CrCl 143. CBC ok. No current  bleeding  Infectious Disease - Afebrile, WBC 7.1. No abx, MRSA screen neg  CV: CAD, HTN, HLD, CHF - VSS. Meds: Zocor, metoprolol (lisin d/c to allow for titration of AV nodal blocking agents). Tikosyn. ** QTC 514 today, K+ only 3.5 today** Dose decreased 10/13. KDur 40x1 EF 40-45%. 10/8: TEE - for throm, failed DCCV.  Endo - DM2. CBGs 171-315 on SSI  GI/ Nutrition - obesity. ALT/AST slightly high, other LFTs ok; PO PPI. Intermittent BRBPR over the last month. S/p EGD/colonoscopy 10/10 which showed no bleeding.  Neurology -no issues  Neph - Scr 0.79. K+ only 3.5 (need >4)  Pulm- RA  Hem / Oncology -CBC stable  PTA Med Issues: ASA 81, Celebrex, Pepcid, Victoza, Metformin, MVI  BP: apixaban, PO PPI  Plan: - Apixaban 5mg  po BID. Note due 10/13 - Tikosyn decreased to 250mg  BID (dose decrease) - KDur 40 x 1   Alcee Sipos S. Alford Highland, PharmD, BCPS Clinical Staff Pharmacist Pager [phone]  Eilene Ghazi Stillinger 11/06/2012,11:02  AM

## 2012-11-06 NOTE — Progress Notes (Signed)
CTSP for chest pain. The nurse went in to assess him and he reported a L sided chest tightness that was ongoing for the last 30 minutes. It was not associated with SOB, nausea, diaphoresis and was not worse with inspiration, palpation or movement. EKG does not show any acute changes. Rhythm stable and VSS. 1 SL NTG relieved the pain from 3/10 down to 0/10. D/w Dr. Stanford Breed. Will cycle cardiac enzymes and observe for further CP. Continue current med regimen at present. Dail Lerew PA-C

## 2012-11-06 NOTE — Progress Notes (Signed)
Inpatient Diabetes Program Recommendations  AACE/ADA: New Consensus Statement on Inpatient Glycemic Control (2013)  Target Ranges:  Prepandial:   less than 140 mg/dL      Peak postprandial:   less than 180 mg/dL (1-2 hours)      Critically ill patients:  140 - 180 mg/dL     Results for Lawrence Jordan, Lawrence Jordan (MRN [id number]) as of 11/06/2012 12:44  Ref. Range 11/05/2012 00:27 11/05/2012 04:03 11/05/2012 08:55 11/05/2012 12:16 11/05/2012 16:52 11/05/2012 19:48  Glucose-Capillary Latest Range: 70-99 mg/dL 189 (H) 173 (H) 202 (H) 315 (H) 255 (H) 297 (H)    Results for Lawrence Jordan, Lawrence Jordan (MRN [id number]) as of 11/06/2012 12:44  Ref. Range 11/05/2012 23:58 11/06/2012 03:42 11/06/2012 07:57 11/06/2012 12:10  Glucose-Capillary Latest Range: 70-99 mg/dL 261 (H) 171 (H) 175 (H) 287 (H)    MD- Please consider the following recommendations:  1. Change diet to Carbohydrate Modified diet (currently on Regular diet with no carbohydrate restrictions) 2. Change Novolog Moderate SSI to tid ac + HS (currently ordered as Q4 hours) 3. Add Novolog meal coverage- Novolog 6 units tid with meals   Will follow. Wyn Quaker RN, MSN, CDE Diabetes Coordinator Inpatient Diabetes Program Team Pager: [phone] (8a-10p)

## 2012-11-06 NOTE — Progress Notes (Signed)
    Subjective:  Denies CP or dyspnea   Objective:  Filed Vitals:   11/06/12 0025 11/06/12 0130 11/06/12 0343 11/06/12 0413  BP: 132/100 118/67 120/75   Pulse: 68 69 70   Temp:   98 F (36.7 C)   TempSrc:   Oral   Resp: 17 17 12    Height:      Weight:    326 lb 11.6 oz (148.2 kg)  SpO2: 100% 99% 98%     Intake/Output from previous day:  Intake/Output Summary (Last 24 hours) at 11/06/12 0745 Last data filed at 11/06/12 0401  Gross per 24 hour  Intake     60 ml  Output   2900 ml  Net  -2840 ml    Physical Exam: Physical exam: Well-developed obese in no acute distress.  Skin is warm and dry.  HEENT is normal.  Neck is supple.  Chest is clear to auscultation with normal expansion.  Cardiovascular exam is RRR Abdominal exam nontender or distended. No masses palpated. Extremities show no edema. neuro grossly intact    Lab Results: Basic Metabolic Panel:  Recent Labs  11/05/12 1240 11/06/12 0546  NA 136 137  K 4.5 3.5  CL 101 100  CO2 20 25  GLUCOSE 289* 160*  BUN 10 10  CREATININE 0.72 0.79  CALCIUM 9.1 9.0  MG 1.7 1.7   CBC:  Recent Labs  11/05/12 0350 11/06/12 0546  WBC 7.9 7.1  NEUTROABS 3.8 3.4  HGB 14.6 13.6  HCT 41.0 38.3*  MCV 93.0 94.6  PLT 168 161     Assessment/Plan:  1 Atrial fibrillation-Started on tikosyn by Dr Caryl Comes; now in sinus; follow QT; continue apixaban. Plan continue lopressor for rate control if afib recurs. Supplement electrolytes. 2 hematochezia-See EGD and colonoscopy results; fu GI for Barrett's and surgery for anal lesions as outpt. 3 hypertension-monitor 4 acute diastolic congestive heart failure-improved. Most likely related to atrial fibrillation. 5 diabetes mellitus-follow CBGs.  6 elevated liver functions-possibly statin related. Will need followup as an outpatient. 7 hypokalemia/hypomagnesemia-supplement. Kirk Ruths 11/06/2012, 7:45 AM

## 2012-11-07 DIAGNOSIS — I10 Essential (primary) hypertension: Secondary | ICD-10-CM

## 2012-11-07 DIAGNOSIS — I428 Other cardiomyopathies: Secondary | ICD-10-CM

## 2012-11-07 DIAGNOSIS — I369 Nonrheumatic tricuspid valve disorder, unspecified: Secondary | ICD-10-CM

## 2012-11-07 LAB — BASIC METABOLIC PANEL
BUN: 11 mg/dL (ref 6–23)
CO2: 24 mEq/L (ref 19–32)
Calcium: 9.4 mg/dL (ref 8.4–10.5)
Chloride: 102 mEq/L (ref 96–112)
Creatinine, Ser: 0.73 mg/dL (ref 0.50–1.35)
GFR calc Af Amer: >90 mL/min (ref >90)
GFR calc non Af Amer: >90 mL/min (ref >90)
Glucose, Bld: 143 mg/dL — ABNORMAL HIGH (ref 70–99)
Potassium: 3.7 mEq/L (ref 3.5–5.1)
Sodium: 140 mEq/L (ref 135–145)

## 2012-11-07 LAB — CBC WITH DIFFERENTIAL/PLATELET
Basophils Absolute: 0 10*3/uL (ref 0.0–0.1)
Basophils Relative: 0 % (ref 0–1)
Eosinophils Absolute: 0.2 10*3/uL (ref 0.0–0.7)
Eosinophils Relative: 2 % (ref 0–5)
HCT: 41.5 % (ref 39.0–52.0)
Hemoglobin: 14.7 g/dL (ref 13.0–17.0)
Lymphocytes Relative: 40 % (ref 12–46)
Lymphs Abs: 3.2 10*3/uL (ref 0.7–4.0)
MCH: 33 pg (ref 26.0–34.0)
MCHC: 35.4 g/dL (ref 30.0–36.0)
MCV: 93.3 fL (ref 78.0–100.0)
Monocytes Absolute: 0.6 10*3/uL (ref 0.1–1.0)
Monocytes Relative: 8 % (ref 3–12)
Neutro Abs: 3.9 10*3/uL (ref 1.7–7.7)
Neutrophils Relative %: 49 % (ref 43–77)
Platelets: 162 10*3/uL (ref 150–400)
RBC: 4.45 MIL/uL (ref 4.22–5.81)
RDW: 13.1 % (ref 11.5–15.5)
WBC: 8 10*3/uL (ref 4.0–10.5)

## 2012-11-07 LAB — GLUCOSE, CAPILLARY
Glucose-Capillary: 157 mg/dL — ABNORMAL HIGH (ref 70–99)
Glucose-Capillary: 168 mg/dL — ABNORMAL HIGH (ref 70–99)
Glucose-Capillary: 182 mg/dL — ABNORMAL HIGH (ref 70–99)
Glucose-Capillary: 240 mg/dL — ABNORMAL HIGH (ref 70–99)
Glucose-Capillary: 246 mg/dL — ABNORMAL HIGH (ref 70–99)
Glucose-Capillary: 255 mg/dL — ABNORMAL HIGH (ref 70–99)

## 2012-11-07 LAB — COMPREHENSIVE METABOLIC PANEL
ALT: 72 U/L — ABNORMAL HIGH (ref 0–53)
AST: 51 U/L — ABNORMAL HIGH (ref 0–37)
Albumin: 3.8 g/dL (ref 3.5–5.2)
Alkaline Phosphatase: 52 U/L (ref 39–117)
BUN: 9 mg/dL (ref 6–23)
CO2: 24 mEq/L (ref 19–32)
Calcium: 9.4 mg/dL (ref 8.4–10.5)
Chloride: 102 mEq/L (ref 96–112)
Creatinine, Ser: 0.67 mg/dL (ref 0.50–1.35)
GFR calc Af Amer: >90 mL/min (ref >90)
GFR calc non Af Amer: >90 mL/min (ref >90)
Glucose, Bld: 159 mg/dL — ABNORMAL HIGH (ref 70–99)
Potassium: 3.7 mEq/L (ref 3.5–5.1)
Sodium: 138 mEq/L (ref 135–145)
Total Bilirubin: 0.4 mg/dL (ref 0.3–1.2)
Total Protein: 7.2 g/dL (ref 6.0–8.3)

## 2012-11-07 LAB — TROPONIN I: Troponin I: <0.3 ng/mL (ref <0.30)

## 2012-11-07 LAB — PROTIME-INR
INR: 1.15 (ref 0.00–1.49)
Prothrombin Time: 14.5 seconds (ref 11.6–15.2)

## 2012-11-07 LAB — MAGNESIUM
Magnesium: 1.7 mg/dL (ref 1.5–2.5)
Magnesium: 1.7 mg/dL (ref 1.5–2.5)

## 2012-11-07 MED ORDER — METOPROLOL TARTRATE 1 MG/ML IV SOLN
2.5000 mg | Freq: Once | INTRAVENOUS | Status: AC
  Administered 2012-11-07: 2.5 mg via INTRAVENOUS
  Filled 2012-11-07: qty 5

## 2012-11-07 MED ORDER — DOFETILIDE 250 MCG PO CAPS
375.0000 ug | ORAL_CAPSULE | Freq: Two times a day (BID) | ORAL | Status: DC
  Administered 2012-11-07 (×2): 375 ug via ORAL
  Filled 2012-11-07 (×4): qty 1

## 2012-11-07 MED ORDER — METOPROLOL TARTRATE 50 MG PO TABS
50.0000 mg | ORAL_TABLET | Freq: Two times a day (BID) | ORAL | Status: DC
  Administered 2012-11-07 – 2012-11-08 (×4): 50 mg via ORAL
  Filled 2012-11-07 (×6): qty 1

## 2012-11-07 MED ORDER — POTASSIUM CHLORIDE CRYS ER 20 MEQ PO TBCR
40.0000 meq | EXTENDED_RELEASE_TABLET | Freq: Every day | ORAL | Status: DC
  Administered 2012-11-07 – 2012-11-10 (×4): 40 meq via ORAL
  Filled 2012-11-07 (×5): qty 2

## 2012-11-07 NOTE — Progress Notes (Signed)
Pt still in and out of afib/ST.  HR sustaining between 110's-140's.  Pt asymptomatic and resting in bed.  MD notified.  One time dose of Metoprolol 2.5mg  IV ordered. Will monitor.  M.Forest Gleason, RN

## 2012-11-07 NOTE — Progress Notes (Signed)
SUBJECTIVE: The patient is doing well today.  At this time, he denies chest pain, shortness of breath, or any new concerns.  He is asymptomatic with his recurrent atrial fibrillation.  Rates are into the 140's at rest.    Tikosyn dose had to be decreased due to QT prolongation.  Pt maintained SR yesterday, but returned to atrial fibrillation with RVR last night.   CURRENT MEDICATIONS: . apixaban  5 mg Oral BID  . dofetilide  250 mcg Oral Q12H  . hydrocortisone cream   Topical BID  . insulin aspart  0-15 Units Subcutaneous Q4H  . latanoprost  1 drop Both Eyes QHS  . magnesium oxide  400 mg Oral BID  . metoprolol tartrate  25 mg Oral BID  . pantoprazole  40 mg Oral Daily  . simvastatin  20 mg Oral QHS  . sodium chloride  3 mL Intravenous Q12H  . sodium chloride  3 mL Intravenous Q12H      OBJECTIVE: Physical Exam: Filed Vitals:   11/06/12 1950 11/07/12 0005 11/07/12 0022 11/07/12 0515  BP: 117/86 154/100 148/97 108/69  Pulse: 66 112    Temp: 98.2 F (36.8 C) 98.2 F (36.8 C)  98.2 F (36.8 C)  TempSrc: Oral Oral  Oral  Resp: 21 23 19 20   Height:      Weight:      SpO2: 100% 96%  100%    Intake/Output Summary (Last 24 hours) at 11/07/12 0746 Last data filed at 11/07/12 0300  Gross per 24 hour  Intake      0 ml  Output   2400 ml  Net  -2400 ml    Telemetry reveals atrial fibrillation with RVR, rates 140's at rest  GEN- The patient is well appearing, alert and oriented x 3 today.   Head- normocephalic, atraumatic Eyes-  Sclera clear, conjunctiva pink Ears- hearing intact Oropharynx- clear Neck- supple, no JVP Lymph- no cervical lymphadenopathy Lungs- Clear to ausculation bilaterally, normal work of breathing Heart- irregular rate and rhythm  GI- soft, NT, ND, + BS Extremities- no clubbing, cyanosis, or edema Skin- no rash or lesion Psych- euthymic mood, full affect Neuro- strength and sensation are intact  LABS: Basic Metabolic Panel:  Recent Labs  11/07/12 0100 11/07/12 0445  NA 140 138  K 3.7 3.7  CL 102 102  CO2 24 24  GLUCOSE 143* 159*  BUN 11 9  CREATININE 0.73 0.67  CALCIUM 9.4 9.4  MG 1.7 1.7   Liver Function Tests:  Recent Labs  11/06/12 0546 11/07/12 0445  AST 43* 51*  ALT 66* 72*  ALKPHOS 56 52  BILITOT 0.4 0.4  PROT 7.0 7.2  ALBUMIN 3.7 3.8   CBC:  Recent Labs  11/06/12 0546 11/07/12 0445  WBC 7.1 8.0  NEUTROABS 3.4 3.9  HGB 13.6 14.7  HCT 38.3* 41.5  MCV 94.6 93.3  PLT 161 162   Cardiac Enzymes:  Recent Labs  11/06/12 1335 11/06/12 2036 11/07/12 0100  TROPONINI <0.30 <0.30 <0.30    ASSESSMENT AND PLAN:  Principal Problem:   Atrial flutter with rapid ventricular response Active Problems:   Atrial fibrillation   Lower GI bleeding   Acute edema of lung, unspecified  1. Persistent afib Difficult to control Increase tikosyn to 393mcg BID and follow (QTc this am 477) Continue to titrate metoprolol 50mg  BID Echo to evaluate LA size today  2. Acute decrease in EF Possibly tachy mediated  Reassess once maintaining sinus rhythm  3. HTN Stable  No change required today

## 2012-11-07 NOTE — Progress Notes (Signed)
After metoprolol, pt HR still sustaining between 110's-140's.  Pt asymptomatic, resting in bed.  NP updated on pt status.  No new orders at this time. Will report to AM nurse and continue to monitor.    M.Forest Gleason, RN

## 2012-11-07 NOTE — Progress Notes (Signed)
Pt ambulating by sink.  Pt back in afib, HR up to 140-s, highest 162 non-sustaining.  Asymptomatic.  Pt back in bed. MD notified.  Labs ordered and continue to monitor.  M.Forest Gleason, RN

## 2012-11-07 NOTE — Progress Notes (Signed)
  Echocardiogram 2D Echocardiogram has been performed.  Lawrence Jordan 11/07/2012, 10:42 AM

## 2012-11-08 ENCOUNTER — Encounter (HOSPITAL_COMMUNITY): Payer: Self-pay | Admitting: Certified Registered"

## 2012-11-08 ENCOUNTER — Inpatient Hospital Stay (HOSPITAL_COMMUNITY): Payer: BC Managed Care – PPO | Admitting: Certified Registered"

## 2012-11-08 ENCOUNTER — Encounter (HOSPITAL_COMMUNITY): Payer: BC Managed Care – PPO | Admitting: Certified Registered"

## 2012-11-08 LAB — COMPREHENSIVE METABOLIC PANEL
ALT: 75 U/L — ABNORMAL HIGH (ref 0–53)
AST: 52 U/L — ABNORMAL HIGH (ref 0–37)
Albumin: 3.8 g/dL (ref 3.5–5.2)
Alkaline Phosphatase: 52 U/L (ref 39–117)
BUN: 13 mg/dL (ref 6–23)
CO2: 25 mEq/L (ref 19–32)
Calcium: 9.7 mg/dL (ref 8.4–10.5)
Chloride: 99 mEq/L (ref 96–112)
Creatinine, Ser: 0.88 mg/dL (ref 0.50–1.35)
GFR calc Af Amer: >90 mL/min (ref >90)
GFR calc non Af Amer: >90 mL/min (ref >90)
Glucose, Bld: 149 mg/dL — ABNORMAL HIGH (ref 70–99)
Potassium: 3.7 mEq/L (ref 3.5–5.1)
Sodium: 137 mEq/L (ref 135–145)
Total Bilirubin: 0.5 mg/dL (ref 0.3–1.2)
Total Protein: 7.4 g/dL (ref 6.0–8.3)

## 2012-11-08 LAB — CBC WITH DIFFERENTIAL/PLATELET
Basophils Absolute: 0 10*3/uL (ref 0.0–0.1)
Basophils Relative: 0 % (ref 0–1)
Eosinophils Absolute: 0.2 10*3/uL (ref 0.0–0.7)
Eosinophils Relative: 2 % (ref 0–5)
HCT: 42.4 % (ref 39.0–52.0)
Hemoglobin: 15.3 g/dL (ref 13.0–17.0)
Lymphocytes Relative: 45 % (ref 12–46)
Lymphs Abs: 3.8 10*3/uL (ref 0.7–4.0)
MCH: 33.5 pg (ref 26.0–34.0)
MCHC: 36.1 g/dL — ABNORMAL HIGH (ref 30.0–36.0)
MCV: 92.8 fL (ref 78.0–100.0)
Monocytes Absolute: 0.6 10*3/uL (ref 0.1–1.0)
Monocytes Relative: 7 % (ref 3–12)
Neutro Abs: 3.9 10*3/uL (ref 1.7–7.7)
Neutrophils Relative %: 46 % (ref 43–77)
Platelets: 183 10*3/uL (ref 150–400)
RBC: 4.57 MIL/uL (ref 4.22–5.81)
RDW: 13.2 % (ref 11.5–15.5)
WBC: 8.5 10*3/uL (ref 4.0–10.5)

## 2012-11-08 LAB — GLUCOSE, CAPILLARY
Glucose-Capillary: 139 mg/dL — ABNORMAL HIGH (ref 70–99)
Glucose-Capillary: 142 mg/dL — ABNORMAL HIGH (ref 70–99)
Glucose-Capillary: 216 mg/dL — ABNORMAL HIGH (ref 70–99)
Glucose-Capillary: 230 mg/dL — ABNORMAL HIGH (ref 70–99)
Glucose-Capillary: 236 mg/dL — ABNORMAL HIGH (ref 70–99)
Glucose-Capillary: 250 mg/dL — ABNORMAL HIGH (ref 70–99)

## 2012-11-08 LAB — PROTIME-INR
INR: 1.12 (ref 0.00–1.49)
Prothrombin Time: 14.2 seconds (ref 11.6–15.2)

## 2012-11-08 LAB — MAGNESIUM: Magnesium: 1.7 mg/dL (ref 1.5–2.5)

## 2012-11-08 MED ORDER — AMIODARONE HCL 200 MG PO TABS
400.0000 mg | ORAL_TABLET | Freq: Three times a day (TID) | ORAL | Status: DC
  Administered 2012-11-08 – 2012-11-10 (×6): 400 mg via ORAL
  Filled 2012-11-08 (×8): qty 2

## 2012-11-08 MED ORDER — METOPROLOL TARTRATE 1 MG/ML IV SOLN
5.0000 mg | INTRAVENOUS | Status: AC | PRN
  Administered 2012-11-08 (×3): 5 mg via INTRAVENOUS
  Filled 2012-11-08 (×3): qty 5

## 2012-11-08 MED ORDER — METOPROLOL TARTRATE 1 MG/ML IV SOLN
5.0000 mg | INTRAVENOUS | Status: AC | PRN
  Administered 2012-11-08 (×3): 5 mg via INTRAVENOUS
  Filled 2012-11-08 (×2): qty 5

## 2012-11-08 MED ORDER — PROPOFOL 10 MG/ML IV BOLUS
INTRAVENOUS | Status: DC | PRN
  Administered 2012-11-08: 60 mg via INTRAVENOUS
  Administered 2012-11-08: 70 mg via INTRAVENOUS

## 2012-11-08 NOTE — Anesthesia Procedure Notes (Signed)
Date/Time: 11/08/2012 9:05 AM Performed by: Babs Bertin Pre-anesthesia Checklist: Patient identified, Emergency Drugs available, Suction available, Patient being monitored and Timeout performed Patient Re-evaluated:Patient Re-evaluated prior to inductionOxygen Delivery Method: Ambu bag Intubation Type: IV induction

## 2012-11-08 NOTE — CV Procedure (Signed)
EP procedure Note   Pre procedure Diagnosis:  Persistent Atrial fibrillation Post procedure Diagnosis:  Same  Procedures:  Electrical cardioversion  Description:  Informed, written consent was obtained for cardioversion.  Adequate IV acces and airway support were assured.  The patient was adequately sedated with intravenous propofol as outlined in the anesthesia report.  The patient presented today in atrial fibrillation.  He was successfully cardioverted to sinus rhythm initially  with a single synchronized biphasic 200J shock delivered with cardioversion electrodes placed in the anterior/posterior configuration.  He then had short episodes of rapid atrial firing followed by sustained afib.  Cardioversion was repeated x 2 with the same result observed with both subsequent attempts.  He remained in afib thereafter.     There were no early apparent complications.  Conclusions:  1.  Successful cardioversion of afib to sinus rhythm with immediate atrial firing (likely from a PV focus) and return to afib. 2.  No early apparent complications.   Wayne Wicklund,MD 10:01 AM 11/08/2012

## 2012-11-08 NOTE — Anesthesia Preprocedure Evaluation (Addendum)
Anesthesia Evaluation  Patient identified by MRN, date of birth, ID band Patient awake    Reviewed: Allergy & Precautions, H&P , Patient's Chart, lab work & pertinent test results  Airway Mallampati: I TM Distance: >3 FB Neck ROM: Full    Dental  (+) Teeth Intact   Pulmonary          Cardiovascular hypertension, Pt. on medications and Pt. on home beta blockers + CAD + dysrhythmias Atrial Fibrillation Rhythm:Irregular Rate:Tachycardia  Echo 11/07/12 EF 50-55%   Neuro/Psych    GI/Hepatic   Endo/Other  diabetes, Type 2, Oral Hypoglycemic Agents  Renal/GU      Musculoskeletal   Abdominal   Peds  Hematology   Anesthesia Other Findings   Reproductive/Obstetrics                         Anesthesia Physical Anesthesia Plan  ASA: III  Anesthesia Plan: General   Post-op Pain Management:    Induction: Intravenous  Airway Management Planned: Mask  Additional Equipment:   Intra-op Plan:   Post-operative Plan:   Informed Consent: I have reviewed the patients History and Physical, chart, labs and discussed the procedure including the risks, benefits and alternatives for the proposed anesthesia with the patient or authorized representative who has indicated his/her understanding and acceptance.   Dental advisory given  Plan Discussed with: Anesthesiologist and Surgeon  Anesthesia Plan Comments:         Anesthesia Quick Evaluation

## 2012-11-08 NOTE — Transfer of Care (Signed)
Immediate Anesthesia Transfer of Care Note  Patient: Lawrence Jordan  Procedure(s) Performed: * No procedures listed *  Patient Location: Nursing Unit  Anesthesia Type:General  Level of Consciousness: awake, alert , oriented and patient cooperative  Airway & Oxygen Therapy: Patient Spontanous Breathing and Patient connected to nasal cannula oxygen  Post-op Assessment: Report given to PACU RN and Post -op Vital signs reviewed and stable  Post vital signs: Reviewed and stable  Complications: No apparent anesthesia complications

## 2012-11-08 NOTE — Progress Notes (Signed)
Inpatient Diabetes Program Recommendations  AACE/ADA: New Consensus Statement on Inpatient Glycemic Control  Target Ranges:  Prepandial:   less than 140 mg/dL      Peak postprandial:   less than 180 mg/dL (1-2 hours)      Critically ill patients:  140 - 180 mg/dL  Pager:  [phone] Hours:  8 am-10pm   Reason for Visit: Elevated glucose  Inpatient Diabetes Program Recommendations Correction (SSI): Change to ACHS Insulin - Meal Coverage: Consider adding meal coverage:  Novolog 3-4 units TID  Courtney Heys PhD, RN, BC-ADM Diabetes Coordinator  Office:  [phone] Team Pager:  [phone]

## 2012-11-08 NOTE — Anesthesia Postprocedure Evaluation (Signed)
  Anesthesia Post-op Note  Patient: Lawrence Jordan  Procedure(s) Performed: * No procedures listed *  Patient Location: Nursing Unit  Anesthesia Type:General  Level of Consciousness: awake, alert , oriented and patient cooperative  Airway and Oxygen Therapy: Patient Spontanous Breathing and Patient connected to nasal cannula oxygen  Post-op Pain: none  Post-op Assessment: Post-op Vital signs reviewed, Patient's Cardiovascular Status Stable, Respiratory Function Stable, Patent Airway and No signs of Nausea or vomiting  Post-op Vital Signs: Reviewed and stable  Complications: No apparent anesthesia complications

## 2012-11-08 NOTE — Progress Notes (Signed)
SUBJECTIVE: At this time, he denies chest pain, shortness of breath, or any new concerns.  He is asymptomatic with his recurrent atrial fibrillation.    CURRENT MEDICATIONS: . apixaban  5 mg Oral BID  . dofetilide  375 mcg Oral Q12H  . hydrocortisone cream   Topical BID  . insulin aspart  0-15 Units Subcutaneous Q4H  . latanoprost  1 drop Both Eyes QHS  . magnesium oxide  400 mg Oral BID  . metoprolol tartrate  50 mg Oral BID  . pantoprazole  40 mg Oral Daily  . potassium chloride  40 mEq Oral Daily  . simvastatin  20 mg Oral QHS  . sodium chloride  3 mL Intravenous Q12H    OBJECTIVE: Physical Exam: Filed Vitals:   11/08/12 0034 11/08/12 0112 11/08/12 0120 11/08/12 0400  BP: 115/88 124/107    Pulse: 101 86 97   Temp:    97.5 F (36.4 C)  TempSrc:    Oral  Resp: 22 13 21    Height:      Weight:      SpO2: 97% 98% 97%     Intake/Output Summary (Last 24 hours) at 11/08/12 0831 Last data filed at 11/08/12 0400  Gross per 24 hour  Intake    240 ml  Output   1900 ml  Net  -1660 ml    Telemetry reveals atrial fibrillation with RVR, rates 130s at rest  Gen- patient is well appearing, alert and oriented x 3 today   Head- normocephalic, atraumatic Eyes-  Sclera clear, conjunctiva pink Ears- hearing intact Oropharynx- clear Neck- supple, no JVP Lymph- no cervical lymphadenopathy Lungs- Clear to ausculation bilaterally, normal work of breathing Heart- irregular rate and rhythm, no murmur, rub or S3  GI- soft, NT, ND, + BS Extremities- no clubbing, cyanosis, or edema Skin- no rash or lesion Psych- euthymic mood, full affect Neuro- strength and sensation are intact  LABS: Basic Metabolic Panel:  Recent Labs  11/07/12 0445 11/08/12 0500  NA 138 137  K 3.7 3.7  CL 102 99  CO2 24 25  GLUCOSE 159* 149*  BUN 9 13  CREATININE 0.67 0.88  CALCIUM 9.4 9.7  MG 1.7 1.7   Liver Function Tests:  Recent Labs  11/07/12 0445 11/08/12 0500  AST 51* 52*  ALT 72*  75*  ALKPHOS 52 52  BILITOT 0.4 0.5  PROT 7.2 7.4  ALBUMIN 3.8 3.8   CBC:  Recent Labs  11/07/12 0445 11/08/12 0500  WBC 8.0 8.5  NEUTROABS 3.9 3.9  HGB 14.7 15.3  HCT 41.5 42.4  MCV 93.3 92.8  PLT 162 183   Cardiac Enzymes:  Recent Labs  11/06/12 1335 11/06/12 2036 11/07/12 0100  TROPONINI <0.30 <0.30 <0.30    ASSESSMENT AND PLAN:  Principal Problem:   Atrial flutter with rapid ventricular response Active Problems:   Atrial fibrillation   Lower GI bleeding   Acute edema of lung, unspecified  1. Persistent afib Difficult to control Increased tikosyn to 364mcg BID yesterday Continue to titrate metoprolol 50mg  BID Plan for repeat DCCV today Continue Eliquis Echo - LA size 48 mm  2. Acute decrease in EF Possibly tachy mediated  Reassess once maintaining sinus rhythm  3. HTN Stable No change required today  Signed, EDMISTEN, BROOKE, PA-C  I have seen, examined the patient, and reviewed the above assessment and plan.  Changes to above are made where necessary.  The patient remains in afib despite increased tikosyn dosing.  Will proceed  with cardioversion at this time.  Risks, benefits, and alternatives to cardioversion were discussed with the patient who wishes to proceed. If he fails cardioversion then we will likely consider amiodarone at that time.  Co Sign: [name], MD 11/08/2012 8:45 AM

## 2012-11-09 DIAGNOSIS — I2510 Atherosclerotic heart disease of native coronary artery without angina pectoris: Secondary | ICD-10-CM

## 2012-11-09 LAB — COMPREHENSIVE METABOLIC PANEL
ALT: 76 U/L — ABNORMAL HIGH (ref 0–53)
AST: 48 U/L — ABNORMAL HIGH (ref 0–37)
Albumin: 3.5 g/dL (ref 3.5–5.2)
Alkaline Phosphatase: 55 U/L (ref 39–117)
BUN: 12 mg/dL (ref 6–23)
CO2: 23 mEq/L (ref 19–32)
Calcium: 9.2 mg/dL (ref 8.4–10.5)
Chloride: 101 mEq/L (ref 96–112)
Creatinine, Ser: 0.72 mg/dL (ref 0.50–1.35)
GFR calc Af Amer: >90 mL/min (ref >90)
GFR calc non Af Amer: >90 mL/min (ref >90)
Glucose, Bld: 184 mg/dL — ABNORMAL HIGH (ref 70–99)
Potassium: 3.7 mEq/L (ref 3.5–5.1)
Sodium: 138 mEq/L (ref 135–145)
Total Bilirubin: 0.3 mg/dL (ref 0.3–1.2)
Total Protein: 6.8 g/dL (ref 6.0–8.3)

## 2012-11-09 LAB — GLUCOSE, CAPILLARY
Glucose-Capillary: 296 mg/dL — ABNORMAL HIGH (ref 70–99)
Glucose-Capillary: 167 mg/dL — ABNORMAL HIGH (ref 70–99)
Glucose-Capillary: 151 mg/dL — ABNORMAL HIGH (ref 70–99)
Glucose-Capillary: 255 mg/dL — ABNORMAL HIGH (ref 70–99)
Glucose-Capillary: 213 mg/dL — ABNORMAL HIGH (ref 70–99)
Glucose-Capillary: 233 mg/dL — ABNORMAL HIGH (ref 70–99)

## 2012-11-09 LAB — CBC WITH DIFFERENTIAL/PLATELET
Basophils Absolute: 0 10*3/uL (ref 0.0–0.1)
Basophils Relative: 0 % (ref 0–1)
Eosinophils Absolute: 0.2 10*3/uL (ref 0.0–0.7)
Eosinophils Relative: 2 % (ref 0–5)
HCT: 41.1 % (ref 39.0–52.0)
Hemoglobin: 15 g/dL (ref 13.0–17.0)
Lymphocytes Relative: 43 % (ref 12–46)
Lymphs Abs: 3.6 10*3/uL (ref 0.7–4.0)
MCH: 33.7 pg (ref 26.0–34.0)
MCHC: 36.5 g/dL — ABNORMAL HIGH (ref 30.0–36.0)
MCV: 92.4 fL (ref 78.0–100.0)
Monocytes Absolute: 0.6 10*3/uL (ref 0.1–1.0)
Monocytes Relative: 7 % (ref 3–12)
Neutro Abs: 4 10*3/uL (ref 1.7–7.7)
Neutrophils Relative %: 48 % (ref 43–77)
Platelets: 182 10*3/uL (ref 150–400)
RBC: 4.45 MIL/uL (ref 4.22–5.81)
RDW: 12.8 % (ref 11.5–15.5)
WBC: 8.3 10*3/uL (ref 4.0–10.5)

## 2012-11-09 LAB — MAGNESIUM: Magnesium: 1.5 mg/dL (ref 1.5–2.5)

## 2012-11-09 MED ORDER — METOPROLOL TARTRATE 100 MG PO TABS
100.0000 mg | ORAL_TABLET | Freq: Two times a day (BID) | ORAL | Status: DC
  Administered 2012-11-09 – 2012-11-10 (×3): 100 mg via ORAL
  Filled 2012-11-09 (×4): qty 1

## 2012-11-09 MED ORDER — CYCLOBENZAPRINE HCL 5 MG PO TABS
5.0000 mg | ORAL_TABLET | Freq: Once | ORAL | Status: AC
  Administered 2012-11-09: 5 mg via ORAL
  Filled 2012-11-09: qty 1

## 2012-11-09 NOTE — Progress Notes (Signed)
SUBJECTIVE: At this time, he denies chest pain, shortness of breath, or any new concerns.  He is asymptomatic with his recurrent atrial fibrillation.    CURRENT MEDICATIONS: . amiodarone  400 mg Oral TID  . apixaban  5 mg Oral BID  . hydrocortisone cream   Topical BID  . insulin aspart  0-15 Units Subcutaneous Q4H  . latanoprost  1 drop Both Eyes QHS  . magnesium oxide  400 mg Oral BID  . metoprolol tartrate  50 mg Oral BID  . pantoprazole  40 mg Oral Daily  . potassium chloride  40 mEq Oral Daily  . simvastatin  20 mg Oral QHS  . sodium chloride  3 mL Intravenous Q12H    OBJECTIVE: Physical Exam: Filed Vitals:   11/09/12 0111 11/09/12 0157 11/09/12 0500 11/09/12 0845  BP: 101/69   116/72  Pulse: 58 78 50 121  Temp: 98.4 F (36.9 C)   97.7 F (36.5 C)  TempSrc: Oral   Oral  Resp: 24 6 17 12   Height:      Weight:      SpO2: 98% 97% 100% 98%    Intake/Output Summary (Last 24 hours) at 11/09/12 [id number] Last data filed at 11/09/12 0500  Gross per 24 hour  Intake    300 ml  Output   2100 ml  Net  -1800 ml    Telemetry reveals atrial fibrillation with RVR, rates 120s at rest  Gen- patient is well appearing, alert and oriented x 3 today   Head- normocephalic, atraumatic Eyes-  Sclera clear, conjunctiva pink Ears- hearing intact Oropharynx- clear Neck- supple, no JVP Lymph- no cervical lymphadenopathy Lungs- Clear to ausculation bilaterally, normal work of breathing Heart- irregular rate and rhythm, no murmur, rub or S3  GI- soft, NT, ND, + BS Extremities- no clubbing, cyanosis, or edema Skin- no rash or lesion Psych- euthymic mood, full affect Neuro- strength and sensation are intact  LABS: Basic Metabolic Panel:  Recent Labs  11/08/12 0500 11/09/12 0415  NA 137 138  K 3.7 3.7  CL 99 101  CO2 25 23  GLUCOSE 149* 184*  BUN 13 12  CREATININE 0.88 0.72  CALCIUM 9.7 9.2  MG 1.7 1.5   Liver Function Tests:  Recent Labs  11/08/12 0500 11/09/12 0415    AST 52* 48*  ALT 75* 76*  ALKPHOS 52 55  BILITOT 0.5 0.3  PROT 7.4 6.8  ALBUMIN 3.8 3.5   CBC:  Recent Labs  11/08/12 0500 11/09/12 0415  WBC 8.5 8.3  NEUTROABS 3.9 4.0  HGB 15.3 15.0  HCT 42.4 41.1  MCV 92.8 92.4  PLT 183 182   Cardiac Enzymes:  Recent Labs  11/06/12 1335 11/06/12 2036 11/07/12 0100  TROPONINI <0.30 <0.30 <0.30    ASSESSMENT AND PLAN:  Principal Problem:   Atrial flutter with rapid ventricular response Active Problems:   Atrial fibrillation   Lower GI bleeding   Acute edema of lung, unspecified  1. Persistent afib Failed tikosyn, now initiating amiodarone Increase metoprolol to 100mg  BID Continue Eliquis I anticipate he will go home with rate control and amiodarone oral load and return for cardioversion in several weeks.  Will eventually need ablation I think  2. Acute decrease in EF Possibly tachy mediated  Reassess once maintaining sinus rhythm  3. HTN Stable No change required todaynt who wishes to proceed. If he fails cardioversion then we will likely consider amiodarone at that time.  Hope to discharge tomorrow   Jeneen Rinks  Makeya Hilgert, MD 11/09/2012 9:06 AM

## 2012-11-10 DIAGNOSIS — E1165 Type 2 diabetes mellitus with hyperglycemia: Secondary | ICD-10-CM | POA: Diagnosis present

## 2012-11-10 DIAGNOSIS — E119 Type 2 diabetes mellitus without complications: Secondary | ICD-10-CM | POA: Diagnosis present

## 2012-11-10 LAB — CBC WITH DIFFERENTIAL/PLATELET
Basophils Absolute: 0 10*3/uL (ref 0.0–0.1)
Basophils Relative: 0 % (ref 0–1)
Eosinophils Absolute: 0.2 10*3/uL (ref 0.0–0.7)
Eosinophils Relative: 2 % (ref 0–5)
HCT: 41.9 % (ref 39.0–52.0)
Hemoglobin: 15.4 g/dL (ref 13.0–17.0)
Lymphocytes Relative: 44 % (ref 12–46)
Lymphs Abs: 4.1 10*3/uL — ABNORMAL HIGH (ref 0.7–4.0)
MCH: 34.1 pg — ABNORMAL HIGH (ref 26.0–34.0)
MCHC: 36.8 g/dL — ABNORMAL HIGH (ref 30.0–36.0)
MCV: 92.9 fL (ref 78.0–100.0)
Monocytes Absolute: 0.8 10*3/uL (ref 0.1–1.0)
Monocytes Relative: 8 % (ref 3–12)
Neutro Abs: 4.4 10*3/uL (ref 1.7–7.7)
Neutrophils Relative %: 46 % (ref 43–77)
Platelets: 192 10*3/uL (ref 150–400)
RBC: 4.51 MIL/uL (ref 4.22–5.81)
RDW: 13 % (ref 11.5–15.5)
WBC: 9.5 10*3/uL (ref 4.0–10.5)

## 2012-11-10 LAB — MAGNESIUM: Magnesium: 1.6 mg/dL (ref 1.5–2.5)

## 2012-11-10 LAB — COMPREHENSIVE METABOLIC PANEL
ALT: 67 U/L — ABNORMAL HIGH (ref 0–53)
AST: 35 U/L (ref 0–37)
Albumin: 3.7 g/dL (ref 3.5–5.2)
Alkaline Phosphatase: 55 U/L (ref 39–117)
BUN: 13 mg/dL (ref 6–23)
CO2: 22 mEq/L (ref 19–32)
Calcium: 9.3 mg/dL (ref 8.4–10.5)
Chloride: 102 mEq/L (ref 96–112)
Creatinine, Ser: 0.78 mg/dL (ref 0.50–1.35)
GFR calc Af Amer: >90 mL/min (ref >90)
GFR calc non Af Amer: >90 mL/min (ref >90)
Glucose, Bld: 178 mg/dL — ABNORMAL HIGH (ref 70–99)
Potassium: 3.7 mEq/L (ref 3.5–5.1)
Sodium: 137 mEq/L (ref 135–145)
Total Bilirubin: 0.6 mg/dL (ref 0.3–1.2)
Total Protein: 7.2 g/dL (ref 6.0–8.3)

## 2012-11-10 LAB — GLUCOSE, CAPILLARY
Glucose-Capillary: 176 mg/dL — ABNORMAL HIGH (ref 70–99)
Glucose-Capillary: 265 mg/dL — ABNORMAL HIGH (ref 70–99)

## 2012-11-10 MED ORDER — INFLUENZA VAC SPLIT QUAD 0.5 ML IM SUSP
0.5000 mL | INTRAMUSCULAR | Status: AC
  Administered 2012-11-10: 0.5 mL via INTRAMUSCULAR
  Filled 2012-11-10: qty 0.5

## 2012-11-10 MED ORDER — APIXABAN 5 MG PO TABS: 5.0000 mg | ORAL_TABLET | Freq: Two times a day (BID) | ORAL | Status: DC

## 2012-11-10 MED ORDER — METOPROLOL TARTRATE 100 MG PO TABS: 100.0000 mg | ORAL_TABLET | Freq: Two times a day (BID) | ORAL | Status: DC

## 2012-11-10 MED ORDER — PANTOPRAZOLE SODIUM 40 MG PO TBEC: 40.0000 mg | DELAYED_RELEASE_TABLET | Freq: Every day | ORAL | Status: DC

## 2012-11-10 MED ORDER — MAGNESIUM OXIDE 400 (241.3 MG) MG PO TABS: 400.0000 mg | ORAL_TABLET | Freq: Every day | ORAL | Status: DC

## 2012-11-10 MED ORDER — AMIODARONE HCL 200 MG PO TABS: 400.0000 mg | ORAL_TABLET | Freq: Three times a day (TID) | ORAL | Status: DC

## 2012-11-10 MED ORDER — POTASSIUM CHLORIDE CRYS ER 20 MEQ PO TBCR: 20.0000 meq | EXTENDED_RELEASE_TABLET | Freq: Every day | ORAL | Status: DC

## 2012-11-10 NOTE — Progress Notes (Signed)
    SUBJECTIVE: At this time, he denies chest pain, shortness of breath, or any new concerns.  He is asymptomatic with his recurrent atrial fibrillation.    CURRENT MEDICATIONS: . amiodarone  400 mg Oral TID  . apixaban  5 mg Oral BID  . hydrocortisone cream   Topical BID  . insulin aspart  0-15 Units Subcutaneous Q4H  . latanoprost  1 drop Both Eyes QHS  . magnesium oxide  400 mg Oral BID  . metoprolol tartrate  100 mg Oral BID  . pantoprazole  40 mg Oral Daily  . potassium chloride  40 mEq Oral Daily  . simvastatin  20 mg Oral QHS  . sodium chloride  3 mL Intravenous Q12H    OBJECTIVE: Physical Exam: Filed Vitals:   11/09/12 1200 11/09/12 1558 11/09/12 2056 11/10/12 0020  BP: 95/56  135/106 107/56  Pulse: 101 118 133 107  Temp: 98.2 F (36.8 C) 98.3 F (36.8 C) 98.1 F (36.7 C) 97.7 F (36.5 C)  TempSrc: Oral Oral Oral Oral  Resp: 23 13 21 26   Height:      Weight:      SpO2: 96% 100% 96% 94%    Intake/Output Summary (Last 24 hours) at 11/10/12 0743 Last data filed at 11/09/12 2100  Gross per 24 hour  Intake    843 ml  Output      0 ml  Net    843 ml    Telemetry reveals atrial fibrillation with RVR, rates 100s at rest  Gen- patient is well appearing, alert and oriented x 3 today   Head- normocephalic, atraumatic Eyes-  Sclera clear, conjunctiva pink Ears- hearing intact Oropharynx- clear Neck- supple, no JVP Lymph- no cervical lymphadenopathy Lungs- Clear to ausculation bilaterally, normal work of breathing Heart- irregular rate and rhythm, no murmur, rub or S3  GI- soft, NT, ND, + BS Extremities- no clubbing, cyanosis, or edema Skin- no rash or lesion Psych- euthymic mood, full affect Neuro- strength and sensation are intact  LABS: Basic Metabolic Panel:  Recent Labs  11/09/12 0415 11/10/12 0345  NA 138 137  K 3.7 3.7  CL 101 102  CO2 23 22  GLUCOSE 184* 178*  BUN 12 13  CREATININE 0.72 0.78  CALCIUM 9.2 9.3  MG 1.5 1.6   Liver  Function Tests:  Recent Labs  11/09/12 0415 11/10/12 0345  AST 48* 35  ALT 76* 67*  ALKPHOS 55 55  BILITOT 0.3 0.6  PROT 6.8 7.2  ALBUMIN 3.5 3.7   CBC:  Recent Labs  11/09/12 0415 11/10/12 0345  WBC 8.3 9.5  NEUTROABS 4.0 4.4  HGB 15.0 15.4  HCT 41.1 41.9  MCV 92.4 92.9  PLT 182 192   ASSESSMENT AND PLAN:  Principal Problem:   Atrial flutter with rapid ventricular response Active Problems:   Atrial fibrillation   Lower GI bleeding   Acute edema of lung, unspecified  1. Persistent afib Failed tikosyn, now initiating amiodarone Increase metoprolol to 100mg  BID Continue Eliquis Continue amiodarone 400mg  TID x 1 week then 400mg  BID.   Follow-up with me in 2.5 weeks to discuss cardioversion vs ablation  2. Acute decrease in EF Possibly tachy mediated  Reassess once maintaining sinus rhythm  3. HTN Stable No change required today  DC to home today He will likely need short term disability until we have him better rate controlled or in sinus.

## 2012-11-10 NOTE — Progress Notes (Signed)
ANTICOAGULATION CONSULT NOTE - Follow Up Consult  Pharmacy Consult for Apixaban Indication: atrial fibrillation  Allergies  Allergen Reactions  . Iodinated Diagnostic Agents Anaphylaxis  . Sulfa Antibiotics Rash    Patient Measurements: Height: 5\' 11"  (180.3 cm) Weight: 326 lb 11.6 oz (148.2 kg) (standing scale) IBW/kg (Calculated) : 75.3  Vital Signs: Temp: 98 F (36.7 C) (10/17 0800) Temp src: Oral (10/17 0800) BP: 90/65 mmHg (10/17 0800) Pulse Rate: 79 (10/17 0800)  Labs:  Recent Labs  11/08/12 0500 11/09/12 0415 11/10/12 0345  HGB 15.3 15.0 15.4  HCT 42.4 41.1 41.9  PLT 183 182 192  LABPROT 14.2  --   --   INR 1.12  --   --   CREATININE 0.88 0.72 0.78    Estimated Creatinine Clearance: 143.3 ml/min (by C-G formula based on Cr of 0.78).   Assessment: 62 y.o. M who continues on Apixaban for anticoagulation in the setting of new-onset Afib, failed DCCV on 11/08/12. Apixaban dose remains appropriate at 5 mg bid. CBC stable -- no overt s/sx of bleeding noted. The patient has been educated on apixaban this admission.  Goal of Therapy:  Appropriate anticoagulation   Plan:  1. Continue apixaban 5 mg bid  2. Will continue to monitor for any signs/symptoms of bleeding or need for dose adjustments  Alycia Rossetti, PharmD, BCPS Clinical Pharmacist Pager: [phone] 11/10/2012 10:44 AM

## 2012-11-10 NOTE — Discharge Summary (Signed)
CARDIOLOGY DISCHARGE SUMMARY   Patient ID: THEODEN CARLES MRN: [id number] DOB/AGE: July 01, 1950 62 y.o.  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 male with a history of nonobstructive CAD by cath in 2012 but no history of arrhythmia. He was having problems with fatigue, dyspnea on exertion and lower extremity edema. He noted some evidence of a lower GI bleed and went to his primary care physician's office. He was found to be in rapid atrial flutter and sent to [facility]. He received IV diltiazem but his heart rate remained elevated. He was transferred to [facility] and admitted for further evaluation and treatment.  1. Atrial fibrillation/atrial flutter: His cardiac enzymes were negative for MI. A TSH was checked and was within normal limits. He was continued on the Cardizem drip but his heart rate remained elevated. It was felt a TEE cardioversion was needed, and this was performed on 11/01/2012. He tolerated the procedure well and had no left atrial appendage thrombus, but did not maintain sinus rhythm and reverted rapidly back to atrial fibrillation. He had a 2-D echocardiogram to further evaluate his heart including right heart pressures and assess for an effusion. He had no significant abnormalities.  He was then tried on Tikosyn starting 10/11, and the diltiazem was discontinued. He was started on metoprolol for rate control and this was up-titrated as his blood pressure would allow. There were  concerns because of a prolonged QTC. The Tikosyn dose was decreased from 500 mcg to 250 micrograms twice a day. He had sinus rhythm for several hours but went back into atrial fibrillation. The Tikosyn was increased back to 375 mcg. Cardioversion was then tried on 10/15. Cardioversion was unsuccessful with immediate atrial firing and a return to a atrial fibrillation. The atrial firing was likely from a PV focus. Dr. [name] felt he had failed Tikosyn and recommended starting amiodarone. Dr. [name] also noted that Mr. Ruddock would likely need ablation at some point.  Mr. Lantzy was started on amiodarone at 400 mg 3 times a day. The data on iodine allergy and amiodarone were reviewed. This is not an absolute contraindication and he will be followed closely on the amiodarone. He is currently tolerating it well. His heart rate showed some improvement on the metoprolol, but he is generally tachycardic, even at rest.  2. lower GI bleed: Mr. Wolfe was seen by Dr. Penelope Coop. He recommended an EGD and colonoscopy. These were performed on 11/03/2012. Barrett's esophagus was seen on the EGD. He should be on a proton pump inhibitor but there was no contraindication to anticoagulation. A colonoscopy was also performed. It showed internal and external hemorrhoids as well as skin tags. It is recommended he see a surgeon in regard to the perianal lesions, but Dr. Penelope Coop him did not feel there was any contraindication to anticoagulation.  3. Anticoagulation: He was initially started on aspirin and heparin. The various options for anti-coagulation were reviewed and it was recommended he be placed on Eliquis. His dose on Eliquis is 5 mg daily. Once the Eliquis was  started, the heparin was discontinued. He is tolerating the Eliquis well. The aspirin was discontinued once the Eliquis was started.  4. Acute edema of the lung: Mr. Cada had some volume overload on admission with shortness of breath and lower extremity edema. He was  diuresed with IV Lasix and his weight decreased by 9 pounds during his hospital stay. His edema and shortness of breath improved. He is currently not on a diuretic but is encouraged to weigh himself daily and contact us for increasing weight or shortness of breath.  5. Left ventricular dysfunction: his EF at the TEE was 45-50%. 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 On 2-D echocardiogram, his EF was 50-55%. He had no wall motion abnormalities. He is on a beta blocker but his blood pressure would not tolerate the addition of an ACE inhibitor at this time. His EF will be rechecked once he is maintaining sinus rhythm, and hopefully will improve.  6. Type 2 diabetes: Mr. Boxberger has a history of diabetes and was on Glucophage plus Victoza prior to admission. A hemoglobin A1c was checked and was elevated at 9.6. Compliance with a diabetic diet is encouraged. His blood sugars were managed while in the hospital with sliding scale insulin. He is to go back on his home medication at discharge and followup with primary care.  7. hypokalemia and hypomagnesemia: Mr. Venzor had some hypokalemia, especially after diuresis with IV Lasix. This was supplemented but his potassium level remained less than 4.0 despite being on 40 mEq daily. He is currently not on a diuretic. We will decrease the dose to 20 mEq daily and continue it for now. His magnesium was also borderline low on admission and supplementation was initiated. He is to continue the supplementation by Mag-Ox at 400 mg daily for now.  On 11/10/2012, Mr. Deringer was seen by Dr. [name] and all data were reviewed. He is still in atrial fibrillation but will need more time for his amiodarone to load prior to attempting to reestablish sinus rhythm. Medication adjustments were made and he is considered stable for discharge, to follow up as an outpatient.   Labs:  Lab Results  Component Value Date   WBC 9.5 11/10/2012   HGB 15.4  11/10/2012   HCT 41.9 11/10/2012   MCV 92.9 11/10/2012   PLT 192 11/10/2012     Recent Labs Lab 11/10/12 0345  NA 137  K 3.7  CL 102  CO2 22  BUN 13  CREATININE 0.78  CALCIUM 9.3  PROT 7.2  BILITOT 0.6  ALKPHOS 55  ALT 67*  AST 35  GLUCOSE 178*    Recent Labs  11/08/12 0500  INR 1.12   Lab Results  Component Value Date   TSH 3.613 10/31/2012   Lab Results  Component Value Date   TROPONINI <0.30 11/07/2012   Lab Results  Component Value Date   HGBA1C 9.6* 10/31/2012   EKG: 08-Nov-2012 10:44:48  Atrial flutter with variable A-V block Vent. rate 114 BPM PR interval * ms QRS duration 84 ms QT/QTc 354/487 ms P-R-T axes * -1 9  Echo: 11/07/2012 Study Conclusions - Left ventricle: The cavity size was normal. Wall thickness was increased in a pattern of mild LVH. Systolic function was normal. The estimated ejection fraction was in the range of 50% to 55%. - Aortic valve: Trivial regurgitation. - Left atrium: The atrium was moderately dilated. - Atrial septum: No defect or patent foramen ovale was identified. - Pericardium, extracardiac: A trivial pericardial  effusion was identified posterior to the heart.   FOLLOW UP PLANS AND APPOINTMENTS Allergies  Allergen Reactions  . Iodinated Diagnostic Agents Anaphylaxis  . Sulfa Antibiotics Rash     Medication List    STOP taking these medications       aspirin 81 MG tablet     celecoxib 200 MG capsule  Commonly known as:  CELEBREX     famotidine 20 MG tablet  Commonly known as:  PEPCID     lisinopril 20 MG tablet  Commonly known as:  PRINIVIL,ZESTRIL      TAKE these medications       amiodarone 200 MG tablet  Commonly known as:  PACERONE  Take 2 tablets (400 mg total) by mouth 3 (three) times daily. Take 400 mg TID x 1 week, then 400 mg BID     apixaban 5 MG Tabs tablet  Commonly known as:  ELIQUIS  Take 1 tablet (5 mg total) by mouth 2 (two) times daily.     magnesium oxide 400 (241.3 MG)  MG tablet  Commonly known as:  MAG-OX  Take 1 tablet (400 mg total) by mouth daily.     metFORMIN 500 MG tablet  Commonly known as:  GLUCOPHAGE  Take 1,000 mg by mouth 2 (two) times daily with a meal.     metoprolol 100 MG tablet  Commonly known as:  LOPRESSOR  Take 1 tablet (100 mg total) by mouth 2 (two) times daily.     multivitamin tablet  Take 1 tablet by mouth daily.     pantoprazole 40 MG tablet  Commonly known as:  PROTONIX  Take 1 tablet (40 mg total) by mouth daily.     potassium chloride SA 20 MEQ tablet  Commonly known as:  K-DUR,KLOR-CON  Take 1 tablet (20 mEq total) by mouth daily.     simvastatin 10 MG tablet  Commonly known as:  ZOCOR  Take 20 mg by mouth at bedtime.     Travoprost (BAK Free) 0.004 % Soln ophthalmic solution  Commonly known as:  TRAVATAN  Place 1 drop into both eyes at bedtime.     VICTOZA 18 MG/3ML Sopn  Generic drug:  Liraglutide  Inject 18 mg into the skin daily.        Discharge Orders   Future Appointments Provider Department Dept Phone   11/29/2012 11:15 AM [name], MD [place] Office [phone]   Future Orders Complete By Expires   ([place]) Call MD:  Anytime you have any of the following symptoms: 1) 3 pound weight gain in 24 hours or 5 pounds in 1 week 2) shortness of breath, with or without a dry hacking cough 3) swelling in the hands, feet or stomach 4) if you have to sleep on extra pillows at night in order to breathe.  As directed    Diet - low sodium heart healthy  As directed    Diet Carb Modified  As directed    Increase activity slowly  As directed      Follow-up Information   Follow up with [name], MD On 11/29/2012. (At 11:15 AM)    Specialty:  Cardiology   Contact information:   [address] [address] [phone]       BRING ALL MEDICATIONS WITH YOU TO FOLLOW UP APPOINTMENTS  Time spent with patient to include physician time: 42  min Signed: Rosaria Ferries, PA-C 11/10/2012, 10:55 AM Co-Sign MD  Jeneen Rinks  Armelia Penton MD

## 2012-11-10 NOTE — Progress Notes (Signed)
Discharge summary sent to payer through [facility]  

## 2012-11-10 NOTE — Plan of Care (Signed)
Problem: Phase III Progression Outcomes Goal: Sinus rhythm established or heart rate < 100 at rest Outcome: Progressing Going home in afib but on metoprolol and amiodarone

## 2012-11-10 NOTE — Plan of Care (Signed)
Problem: Discharge Progression Outcomes Goal: Sinus rate/atrial ECG rhythm with HR < 100/min Outcome: Progressing On po medications for afib

## 2012-11-11 ENCOUNTER — Encounter: Payer: Self-pay | Admitting: Internal Medicine

## 2012-11-12 ENCOUNTER — Encounter: Payer: Self-pay | Admitting: Internal Medicine

## 2012-11-13 NOTE — Telephone Encounter (Signed)
Patient was taken the ER yesterday. Wants to make sure it's ok to take Robaxin for puller muscle.

## 2012-11-23 NOTE — Addendum Note (Signed)
Addendum created 11/23/12 1835 by Fulton Reek, MD   Modules edited: Anesthesia Attestations

## 2012-11-29 ENCOUNTER — Ambulatory Visit (INDEPENDENT_AMBULATORY_CARE_PROVIDER_SITE_OTHER): Payer: BC Managed Care – PPO | Admitting: Internal Medicine

## 2012-11-29 ENCOUNTER — Encounter: Payer: Self-pay | Admitting: Internal Medicine

## 2012-11-29 ENCOUNTER — Encounter: Payer: Self-pay | Admitting: *Deleted

## 2012-11-29 VITALS — BP 138/94 | HR 111 | Ht 71.0 in | Wt 314.0 lb

## 2012-11-29 DIAGNOSIS — I10 Essential (primary) hypertension: Secondary | ICD-10-CM

## 2012-11-29 DIAGNOSIS — I4891 Unspecified atrial fibrillation: Secondary | ICD-10-CM

## 2012-11-29 LAB — CBC WITH DIFFERENTIAL/PLATELET
Basophils Absolute: 0 10*3/uL (ref 0.0–0.1)
Basophils Relative: 0.4 % (ref 0.0–3.0)
Eosinophils Absolute: 0.1 10*3/uL (ref 0.0–0.7)
Eosinophils Relative: 1.4 % (ref 0.0–5.0)
HCT: 49.1 % (ref 39.0–52.0)
Hemoglobin: 16.8 g/dL (ref 13.0–17.0)
Lymphocytes Relative: 31.8 % (ref 12.0–46.0)
Lymphs Abs: 3.3 10*3/uL (ref 0.7–4.0)
MCHC: 34.2 g/dL (ref 30.0–36.0)
MCV: 95.9 fl (ref 78.0–100.0)
Monocytes Absolute: 0.9 10*3/uL (ref 0.1–1.0)
Monocytes Relative: 9.2 % (ref 3.0–12.0)
Neutro Abs: 5.9 10*3/uL (ref 1.4–7.7)
Neutrophils Relative %: 57.2 % (ref 43.0–77.0)
Platelets: 241 10*3/uL (ref 150.0–400.0)
RBC: 5.12 Mil/uL (ref 4.22–5.81)
RDW: 13.5 % (ref 11.5–14.6)
WBC: 10.3 10*3/uL (ref 4.5–10.5)

## 2012-11-29 LAB — BASIC METABOLIC PANEL
BUN: 20 mg/dL (ref 6–23)
CO2: 22 mEq/L (ref 19–32)
Calcium: 9.7 mg/dL (ref 8.4–10.5)
Chloride: 100 mEq/L (ref 96–112)
Creatinine, Ser: 1.1 mg/dL (ref 0.4–1.5)
GFR: 69.9 mL/min (ref >60.00)
Glucose, Bld: 131 mg/dL — ABNORMAL HIGH (ref 70–99)
Potassium: 4.6 mEq/L (ref 3.5–5.1)
Sodium: 132 mEq/L — ABNORMAL LOW (ref 135–145)

## 2012-11-29 LAB — MAGNESIUM: Magnesium: 1.9 mg/dL (ref 1.5–2.5)

## 2012-11-29 NOTE — Patient Instructions (Signed)
Your physician has recommended that you have a Cardioversion (DCCV). Electrical Cardioversion uses a jolt of electricity to your heart either through paddles or wired patches attached to your chest. This is a controlled, usually prescheduled, procedure. Defibrillation is done under light anesthesia in the hospital, and you usually go home the day of the procedure. This is done to get your heart back into a normal rhythm. You are not awake for the procedure. Please see the instruction sheet given to you today.  Your physician recommends that you return for lab work today: bmp/cbc/mag

## 2012-12-03 ENCOUNTER — Encounter: Payer: Self-pay | Admitting: Internal Medicine
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
## 2012-12-07 ENCOUNTER — Telehealth: Payer: Self-pay | Admitting: Internal Medicine

## 2012-12-07 ENCOUNTER — Encounter (HOSPITAL_COMMUNITY)
Admission: RE | Disposition: A | Discharge status: Home / Self Care | Payer: Self-pay | Source: Ambulatory Visit | Attending: Cardiology

## 2012-12-07 ENCOUNTER — Encounter (HOSPITAL_COMMUNITY): Payer: Self-pay | Admitting: Certified Registered"

## 2012-12-07 ENCOUNTER — Ambulatory Visit (HOSPITAL_COMMUNITY)
Admission: RE | Admit: 2012-12-07 | Discharge: 2012-12-07 | Disposition: A | Discharge status: Home / Self Care | Payer: BC Managed Care – PPO | Source: Ambulatory Visit | Attending: Cardiology | Admitting: Cardiology

## 2012-12-07 DIAGNOSIS — I4891 Unspecified atrial fibrillation: Secondary | ICD-10-CM | POA: Insufficient documentation

## 2012-12-07 DIAGNOSIS — Z538 Procedure and treatment not carried out for other reasons: Secondary | ICD-10-CM | POA: Insufficient documentation

## 2012-12-07 DIAGNOSIS — Z79899 Other long term (current) drug therapy: Secondary | ICD-10-CM | POA: Insufficient documentation

## 2012-12-07 SURGERY — CANCELLED PROCEDURE

## 2012-12-07 NOTE — Telephone Encounter (Signed)
Lawrence Jordan is going to call patient to schedule

## 2012-12-07 NOTE — Telephone Encounter (Signed)
New message     Need cardioversion follow up for next week with Dr [name].  Dr has no openings and pt refused to see Brooke.  He said he went back in rhythum before actual cardioversion but he needs an appt next week.  Can you work him in?

## 2012-12-07 NOTE — Progress Notes (Signed)
Patient arrived for DCCV. Pre-op EKG showed NSR. Procedure cancelled. Patient was instructed to followup with Dr. [name] in about a week. Continue present dose of amiodarone until then.

## 2012-12-07 NOTE — H&P (Signed)
This 62 year old gentleman presents for elective recurrent cardioversion today.  He is anticoagulated with apixaban and has been loaded with amiodarone as an outpatient.  He was recently seen in the office by Dr. [name] whose note is below.  The patient presents today for routine electrophysiology followup. He was recently hospitalized with refractory afib. He failed medical therapy with tikosyn. With cardioversion, he would have several sinus beats but the immediately return to afib. He was subsequently initiated on amiodarone for rate control with plans to follow-up with me today. He has done reasonably well since his discharge. He has had some difficulty with muscle cramps for which he has been to the ER and needs to follow-up with primary care. His exercise capacity is decreased with his afib. He has SOB and fatigue. Today, he denies symptoms of palpitations, chest pain, orthopnea, PND, lower extremity edema, dizziness, presyncope, syncope, or neurologic sequela. The patient feels that he is tolerating medications without difficulties and is otherwise without complaint today.  Refractory to medical therapy with tikosyn, V rates very difficult to control  He has been loaded with amiodarone as an outpatient. I think that we should proceed with cardioversion at this time. He is appropriately anticoagulated with eliquis. The importance of not missing any doses was stressed with him today. Risks, benefits, and alternatives to cardioversion were also discussed. He is willing to proceed.  Hopefully if we can achieve sinus rhythm, then atrial remodeling can occur. I think that we should plan for an afib ablation within the not too distant future. He will return to follow-up with me in 6 weeks. His ejection fraction is 50-55%

## 2012-12-20 ENCOUNTER — Encounter: Payer: Self-pay | Admitting: Internal Medicine

## 2012-12-20 ENCOUNTER — Encounter: Payer: Self-pay | Admitting: *Deleted

## 2012-12-20 ENCOUNTER — Ambulatory Visit (INDEPENDENT_AMBULATORY_CARE_PROVIDER_SITE_OTHER): Payer: BC Managed Care – PPO | Admitting: Internal Medicine

## 2012-12-20 VITALS — BP 121/82 | HR 52 | Ht 71.0 in | Wt 311.0 lb

## 2012-12-20 DIAGNOSIS — I4891 Unspecified atrial fibrillation: Secondary | ICD-10-CM

## 2012-12-20 DIAGNOSIS — Z01818 Encounter for other preprocedural examination: Secondary | ICD-10-CM | POA: Insufficient documentation

## 2012-12-20 DIAGNOSIS — I10 Essential (primary) hypertension: Secondary | ICD-10-CM

## 2012-12-20 MED ORDER — METOPROLOL TARTRATE 50 MG PO TABS: 50.0000 mg | ORAL_TABLET | Freq: Two times a day (BID) | ORAL | Status: DC

## 2012-12-20 MED ORDER — AMIODARONE HCL 200 MG PO TABS: 200.0000 mg | ORAL_TABLET | Freq: Every day | ORAL | Status: DC

## 2012-12-20 NOTE — Patient Instructions (Addendum)
Your physician recommends that you schedule a follow-up appointment in: 6 weeks with Dr [name]   Your physician has recommended you make the following change in your medication:  1) Decrease Amiodarone to 200mg  daily 2) Stop Magnesium 3) Decrease Metoprolol to 50mg  twice daily  4) Stop Potassium  Okay to proceed with hip surgery
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
## 2012-12-22 ENCOUNTER — Telehealth: Payer: Self-pay | Admitting: Internal Medicine

## 2012-12-22 NOTE — Telephone Encounter (Signed)
Received request from Nurse fax box, documents faxed for surgical clearance. To: [place] Fax number: [phone] Attention: 12/22/12/km

## 2013-01-03 ENCOUNTER — Other Ambulatory Visit (HOSPITAL_COMMUNITY): Payer: Self-pay | Admitting: Physician Assistant

## 2013-02-05 ENCOUNTER — Ambulatory Visit (INDEPENDENT_AMBULATORY_CARE_PROVIDER_SITE_OTHER): Payer: 59 | Admitting: Internal Medicine

## 2013-02-05 ENCOUNTER — Encounter: Payer: Self-pay | Admitting: Internal Medicine

## 2013-02-05 VITALS — BP 140/88 | HR 54 | Ht 71.0 in | Wt 310.0 lb

## 2013-02-05 DIAGNOSIS — R609 Edema, unspecified: Secondary | ICD-10-CM | POA: Insufficient documentation

## 2013-02-05 DIAGNOSIS — I10 Essential (primary) hypertension: Secondary | ICD-10-CM

## 2013-02-05 DIAGNOSIS — E119 Type 2 diabetes mellitus without complications: Secondary | ICD-10-CM

## 2013-02-05 DIAGNOSIS — I4891 Unspecified atrial fibrillation: Secondary | ICD-10-CM

## 2013-02-05 LAB — HEPATIC FUNCTION PANEL
ALT: 41 U/L (ref 0–53)
AST: 30 U/L (ref 0–37)
Albumin: 4.1 g/dL (ref 3.5–5.2)
Alkaline Phosphatase: 50 U/L (ref 39–117)
Bilirubin, Direct: 0.1 mg/dL (ref 0.0–0.3)
Total Bilirubin: 0.6 mg/dL (ref 0.3–1.2)
Total Protein: 7.5 g/dL (ref 6.0–8.3)

## 2013-02-05 LAB — CBC WITH DIFFERENTIAL/PLATELET
Basophils Absolute: 0 10*3/uL (ref 0.0–0.1)
Basophils Relative: 0.5 % (ref 0.0–3.0)
Eosinophils Absolute: 0.1 10*3/uL (ref 0.0–0.7)
Eosinophils Relative: 1.2 % (ref 0.0–5.0)
HCT: 43 % (ref 39.0–52.0)
Hemoglobin: 14.8 g/dL (ref 13.0–17.0)
Lymphocytes Relative: 27.5 % (ref 12.0–46.0)
Lymphs Abs: 2.2 10*3/uL (ref 0.7–4.0)
MCHC: 34.3 g/dL (ref 30.0–36.0)
MCV: 93.9 fl (ref 78.0–100.0)
Monocytes Absolute: 0.7 10*3/uL (ref 0.1–1.0)
Monocytes Relative: 8 % (ref 3.0–12.0)
Neutro Abs: 5.1 10*3/uL (ref 1.4–7.7)
Neutrophils Relative %: 62.8 % (ref 43.0–77.0)
Platelets: 218 10*3/uL (ref 150.0–400.0)
RBC: 4.58 Mil/uL (ref 4.22–5.81)
RDW: 13.7 % (ref 11.5–14.6)
WBC: 8.1 10*3/uL (ref 4.5–10.5)

## 2013-02-05 LAB — BASIC METABOLIC PANEL
BUN: 12 mg/dL (ref 6–23)
CO2: 29 mEq/L (ref 19–32)
Calcium: 9.8 mg/dL (ref 8.4–10.5)
Chloride: 101 mEq/L (ref 96–112)
Creatinine, Ser: 0.9 mg/dL (ref 0.4–1.5)
GFR: 87.46 mL/min (ref >60.00)
Glucose, Bld: 106 mg/dL — ABNORMAL HIGH (ref 70–99)
Potassium: 4 mEq/L (ref 3.5–5.1)
Sodium: 139 mEq/L (ref 135–145)

## 2013-02-05 LAB — T4, FREE: Free T4: 1.03 ng/dL (ref 0.60–1.60)

## 2013-02-05 LAB — TSH: TSH: 2.9 u[IU]/mL (ref 0.35–5.50)

## 2013-02-05 MED ORDER — HYDROCHLOROTHIAZIDE 25 MG PO TABS: 25.0000 mg | ORAL_TABLET | Freq: Every day | ORAL | Status: DC

## 2013-02-05 NOTE — Patient Instructions (Signed)
Your physician wants you to follow-up in: 2 months with Truitt Merle, NP and 4 months with Dr Vallery Ridge will receive a reminder letter in the mail two months in advance. If you don't receive a letter, please call our office to schedule the follow-up appointment.  Your physician recommends that you return for lab work today: LFT's/TSH/T4/BMP/CBC  Your physician has recommended you make the following change in your medication:  1) Start HCTZ 25mg    2 Gram Low Sodium Diet A 2 gram sodium diet restricts the amount of sodium in the diet to no more than 2 g or 2000 mg daily. Limiting the amount of sodium is often used to help lower blood pressure. It is important if you have heart, liver, or kidney problems. Many foods contain sodium for flavor and sometimes as a preservative. When the amount of sodium in a diet needs to be low, it is important to know what to look for when choosing foods and drinks. The following includes some information and guidelines to help make it easier for you to adapt to a low sodium diet. QUICK TIPS  Do not add salt to food.  Avoid convenience items and fast food.  Choose unsalted snack foods.  Buy lower sodium products, often labeled as "lower sodium" or "no salt added."  Check food labels to learn how much sodium is in 1 serving.  When eating at a restaurant, ask that your food be prepared with less salt or none, if possible. READING FOOD LABELS FOR SODIUM INFORMATION The nutrition facts label is a good place to find how much sodium is in foods. Look for products with no more than 500 to 600 mg of sodium per meal and no more than 150 mg per serving. Remember that 2 g = 2000 mg. The food label may also list foods as:  Sodium-free: Less than 5 mg in a serving.  Very low sodium: 35 mg or less in a serving.  Low-sodium: 140 mg or less in a serving.  Light in sodium: 50% less sodium in a serving. For example, if a food that usually has 300 mg of sodium is changed  to become light in sodium, it will have 150 mg of sodium.  Reduced sodium: 25% less sodium in a serving. For example, if a food that usually has 400 mg of sodium is changed to reduced sodium, it will have 300 mg of sodium. CHOOSING FOODS Grains  Avoid: Salted crackers and snack items. Some cereals, including instant hot cereals. Bread stuffing and biscuit mixes. Seasoned rice or pasta mixes.  Choose: Unsalted snack items. Low-sodium cereals, oats, puffed wheat and rice, shredded wheat. English muffins and bread. Pasta. Meats  Avoid: Salted, canned, smoked, spiced, pickled meats, including fish and poultry. Bacon, ham, sausage, cold cuts, hot dogs, anchovies.  Choose: Low-sodium canned tuna and salmon. Fresh or frozen meat, poultry, and fish. Dairy  Avoid: Processed cheese and spreads. Cottage cheese. Buttermilk and condensed milk. Regular cheese.  Choose: Milk. Low-sodium cottage cheese. Yogurt. Sour cream. Low-sodium cheese. Fruits and Vegetables  Avoid: Regular canned vegetables. Regular canned tomato sauce and paste. Frozen vegetables in sauces. Olives. Angie Fava. Relishes. Sauerkraut.  Choose: Low-sodium canned vegetables. Low-sodium tomato sauce and paste. Frozen or fresh vegetables. Fresh and frozen fruit. Condiments  Avoid: Canned and packaged gravies. Worcestershire sauce. Tartar sauce. Barbecue sauce. Soy sauce. Steak sauce. Ketchup. Onion, garlic, and table salt. Meat flavorings and tenderizers.  Choose: Fresh and dried herbs and spices. Low-sodium varieties of mustard  and ketchup. Lemon juice. Tabasco sauce. Horseradish. SAMPLE 2 GRAM SODIUM MEAL PLAN Breakfast / Sodium (mg)  1 cup low-fat milk / [id number] mg  2 slices whole-wheat toast / 270 mg  1 tbs heart-healthy margarine / 153 mg  1 hard-boiled egg / 139 mg  1 small orange / 0 mg Lunch / Sodium (mg)  1 cup raw carrots / 76 mg   cup hummus / 298 mg  1 cup low-fat milk / 143 mg   cup red grapes / 2 mg  1  whole-wheat pita bread / 356 mg Dinner / Sodium (mg)  1 cup whole-wheat pasta / 2 mg  1 cup low-sodium tomato sauce / 73 mg  3 oz lean ground beef / 57 mg  1 small side salad (1 cup raw spinach leaves,  cup cucumber,  cup yellow bell pepper) with 1 tsp olive oil and 1 tsp red wine vinegar / 25 mg Snack / Sodium (mg)  1 container low-fat vanilla yogurt / 107 mg  3 graham cracker squares / 127 mg Nutrient Analysis  Calories: 2033  Protein: 77 g  Carbohydrate: 282 g  Fat: 72 g  Sodium: 1971 mg Document Released: 01/11/2005 Document Revised: 04/05/2011 Document Reviewed: 04/14/2009 [employer] Patient Information 2014 [place].
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
## 2013-02-15 ENCOUNTER — Encounter: Payer: Self-pay | Admitting: Internal Medicine

## 2013-02-15 NOTE — Telephone Encounter (Signed)
Left patient a message that all his labs are normal and to call back with any questions

## 2013-02-27 ENCOUNTER — Encounter (HOSPITAL_COMMUNITY): Payer: Self-pay | Admitting: Pharmacy Technician

## 2013-02-27 ENCOUNTER — Other Ambulatory Visit: Payer: Self-pay | Admitting: Physician Assistant
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
## 2013-03-03 NOTE — Pre-Procedure Instructions (Addendum)
JOHNEY RAFIQ  03/03/2013   Your procedure is scheduled on:  February 18  Report to [facility] Entrance "A" [address] at 10:30 AM.  Call this number if you have problems the morning of surgery: [phone]   Remember:   Do not eat food or drink liquids after midnight.   Take these medicines the morning of surgery with A SIP OF WATER: Amiodarone, Metoprolol, Protonix   STOP Eliquis February 16 as directed by Dr. [name]   STOP/ Do not take Aspirin, Aleve, Naproxen, Advil, Ibuprofen, Vitamin, Herbs, or Supplements starting February 11   Do not wear jewelry.  Do not wear lotions, powders, or colgne. You may wear deodorant.  Do not shave 48 hours prior to surgery. Men may shave face and neck.  Do not bring valuables to the hospital.  [facility] is not responsible                  for any belongings or valuables.               Contacts, dentures or bridgework may not be worn into surgery.  Leave suitcase in the car. After surgery it may be brought to your room.  For patients admitted to the hospital, discharge time is determined by your                treatment team.               Special Instructions: Use CHG as directed below.   Please read over the following fact sheets that you were given: Pain Booklet, Coughing and Deep Breathing, Blood Transfusion Information, Total Joint Packet and Surgical Site Infection Prevention  [place] - Preparing for Surgery  Before surgery, you can play an important role.  Because skin is not sterile, your skin needs to be as free of germs as possible.  You can reduce the number of germs on you skin by washing with CHG (chlorahexidine gluconate) soap before surgery.  CHG is an antiseptic cleaner which kills germs and bonds with the skin to continue killing germs even after washing.  Please DO NOT use if you have an allergy to CHG or antibacterial soaps.  If your skin becomes reddened/irritated stop using the CHG and  inform your nurse when you arrive at Short Stay.  Do not shave (including legs and underarms) for at least 48 hours prior to the first CHG shower.  You may shave your face.  Please follow these instructions carefully:   1.  Shower with CHG Soap the night before surgery and the                                morning of Surgery.  2.  If you choose to wash your hair, wash your hair first as usual with your       normal shampoo.  3.  After you shampoo, rinse your hair and body thoroughly to remove the                      Shampoo.  4.  Use CHG as you would any other liquid soap.  You can apply chg directly       to the skin and wash gently with scrungie or a clean washcloth.  5.  Apply the CHG Soap to your body ONLY FROM THE NECK DOWN.  Do not use on open wounds or open sores.  Avoid contact with your eyes,       ears, mouth and genitals (private parts).  Wash genitals (private parts)       with your normal soap.  6.  Wash thoroughly, paying special attention to the area where your surgery        will be performed.  7.  Thoroughly rinse your body with warm water from the neck down.  8.  DO NOT shower/wash with your normal soap after using and rinsing off       the CHG Soap.  9.  Pat yourself dry with a clean towel.            10.  Wear clean pajamas.            11.  Place clean sheets on your bed the night of your first shower and do not        sleep with pets.  Day of Surgery  Do not apply any lotions the morning of surgery.  Please wear clean clothes to the hospital/surgery center.

## 2013-03-05 ENCOUNTER — Encounter (HOSPITAL_COMMUNITY)
Admission: RE | Admit: 2013-03-05 | Discharge: 2013-03-05 | Disposition: A | Discharge status: Home / Self Care | Payer: 59 | Source: Ambulatory Visit | Attending: Orthopedic Surgery | Admitting: Orthopedic Surgery

## 2013-03-05 ENCOUNTER — Encounter (HOSPITAL_COMMUNITY): Payer: Self-pay

## 2013-03-05 ENCOUNTER — Ambulatory Visit (HOSPITAL_COMMUNITY)
Admission: RE | Admit: 2013-03-05 | Discharge: 2013-03-05 | Disposition: A | Discharge status: Home / Self Care | Payer: 59 | Source: Ambulatory Visit | Attending: Orthopedic Surgery | Admitting: Orthopedic Surgery

## 2013-03-05 DIAGNOSIS — Z01818 Encounter for other preprocedural examination: Secondary | ICD-10-CM | POA: Insufficient documentation

## 2013-03-05 LAB — COMPREHENSIVE METABOLIC PANEL
ALT: 38 U/L (ref 0–53)
AST: 29 U/L (ref 0–37)
Albumin: 4.2 g/dL (ref 3.5–5.2)
Alkaline Phosphatase: 71 U/L (ref 39–117)
BUN: 10 mg/dL (ref 6–23)
CO2: 25 mEq/L (ref 19–32)
Calcium: 9.8 mg/dL (ref 8.4–10.5)
Chloride: 99 mEq/L (ref 96–112)
Creatinine, Ser: 0.91 mg/dL (ref 0.50–1.35)
GFR calc Af Amer: >90 mL/min (ref >90)
GFR calc non Af Amer: 89 mL/min — ABNORMAL LOW (ref >90)
Glucose, Bld: 132 mg/dL — ABNORMAL HIGH (ref 70–99)
Potassium: 3.9 mEq/L (ref 3.7–5.3)
Sodium: 141 mEq/L (ref 137–147)
Total Bilirubin: 0.4 mg/dL (ref 0.3–1.2)
Total Protein: 8.2 g/dL (ref 6.0–8.3)

## 2013-03-05 LAB — URINALYSIS, ROUTINE W REFLEX MICROSCOPIC
Glucose, UA: NEGATIVE mg/dL
Hgb urine dipstick: NEGATIVE
Ketones, ur: NEGATIVE mg/dL
Leukocytes, UA: NEGATIVE
Nitrite: NEGATIVE
Protein, ur: 30 mg/dL — AB
Specific Gravity, Urine: 1.023 (ref 1.005–1.030)
Urobilinogen, UA: 0.2 mg/dL (ref 0.0–1.0)
pH: 6 (ref 5.0–8.0)

## 2013-03-05 LAB — CBC WITH DIFFERENTIAL/PLATELET
Basophils Absolute: 0 10*3/uL (ref 0.0–0.1)
Basophils Relative: 1 % (ref 0–1)
Eosinophils Absolute: 0.1 10*3/uL (ref 0.0–0.7)
Eosinophils Relative: 1 % (ref 0–5)
HCT: 48.2 % (ref 39.0–52.0)
Hemoglobin: 16.7 g/dL (ref 13.0–17.0)
Lymphocytes Relative: 31 % (ref 12–46)
Lymphs Abs: 2.6 10*3/uL (ref 0.7–4.0)
MCH: 32.5 pg (ref 26.0–34.0)
MCHC: 34.6 g/dL (ref 30.0–36.0)
MCV: 93.8 fL (ref 78.0–100.0)
Monocytes Absolute: 0.8 10*3/uL (ref 0.1–1.0)
Monocytes Relative: 9 % (ref 3–12)
Neutro Abs: 4.9 10*3/uL (ref 1.7–7.7)
Neutrophils Relative %: 58 % (ref 43–77)
Platelets: 211 10*3/uL (ref 150–400)
RBC: 5.14 MIL/uL (ref 4.22–5.81)
RDW: 13.3 % (ref 11.5–15.5)
WBC: 8.5 10*3/uL (ref 4.0–10.5)

## 2013-03-05 LAB — URINE MICROSCOPIC-ADD ON

## 2013-03-05 LAB — PROTIME-INR
INR: 1.02 (ref 0.00–1.49)
Prothrombin Time: 13.2 seconds (ref 11.6–15.2)

## 2013-03-05 LAB — SURGICAL PCR SCREEN
MRSA, PCR: NEGATIVE
Staphylococcus aureus: POSITIVE — AB

## 2013-03-05 LAB — APTT: aPTT: 31 seconds (ref 24–37)

## 2013-03-05 NOTE — Progress Notes (Addendum)
Pt denies ever having a blood transfusion and would prefer not to wear the blue blood band for 8 days as he is going to [place].  He understands he will have to have sample drawn DOS.    Also spoke with PA at Dr. Debroah Loop.  I explained to her that Dr. [name] said to stop Eliuqus 2 days out instead of Dr. Debroah Loop 6 days out.  She said to have patient follow Dr. Jackalyn Lombard instruction and if something changed, they would call patient.   DA

## 2013-03-05 NOTE — Progress Notes (Signed)
03/05/13 1025  OBSTRUCTIVE SLEEP APNEA  Have you ever been diagnosed with sleep apnea through a sleep study? No  Do you snore loudly (loud enough to be heard through closed doors)?  0  Do you often feel tired, fatigued, or sleepy during the daytime? 1  Has anyone observed you stop breathing during your sleep? 0  Do you have, or are you being treated for high blood pressure? 1  BMI more than 35 kg/m2? 1  Age over 63 years old? 1  Neck circumference greater than 40 cm/18 inches? 1  Gender: 1  Obstructive Sleep Apnea Score 6  Score 4 or greater  Results sent to PCP

## 2013-03-07 LAB — URINE CULTURE: Colony Count: 70000

## 2013-03-13 MED ORDER — CHLORHEXIDINE GLUCONATE 4 % EX LIQD
60.0000 mL | Freq: Once | CUTANEOUS | Status: DC
  Filled 2013-03-13: qty 60

## 2013-03-13 MED ORDER — DEXTROSE 5 % IV SOLN
3.0000 g | INTRAVENOUS | Status: AC
  Administered 2013-03-14: 3 g via INTRAVENOUS
  Filled 2013-03-13: qty 3000

## 2013-03-13 MED ORDER — CHLORHEXIDINE GLUCONATE 4 % EX LIQD
60.0000 mL | Freq: Once | CUTANEOUS | Status: DC
Start: 2013-03-14 — End: 2013-03-14
  Filled 2013-03-13: qty 60

## 2013-03-13 MED ORDER — LACTATED RINGERS IV SOLN
INTRAVENOUS | Status: DC
  Administered 2013-03-14: 10:00:00 via INTRAVENOUS

## 2013-03-13 NOTE — Progress Notes (Signed)
Patient notified to arrive at 0930 in the morning for surgery.

## 2013-03-14 ENCOUNTER — Inpatient Hospital Stay (HOSPITAL_COMMUNITY): Payer: 59

## 2013-03-14 ENCOUNTER — Encounter (HOSPITAL_COMMUNITY): Payer: Self-pay | Admitting: *Deleted

## 2013-03-14 ENCOUNTER — Inpatient Hospital Stay (HOSPITAL_COMMUNITY): Payer: 59 | Admitting: Anesthesiology

## 2013-03-14 ENCOUNTER — Encounter (HOSPITAL_COMMUNITY): Payer: 59 | Admitting: Anesthesiology

## 2013-03-14 ENCOUNTER — Encounter (HOSPITAL_COMMUNITY)
Admission: RE | Disposition: A | Discharge status: Home Health | Payer: Self-pay | Source: Ambulatory Visit | Attending: Orthopedic Surgery

## 2013-03-14 ENCOUNTER — Inpatient Hospital Stay (HOSPITAL_COMMUNITY)
Admission: RE | Admit: 2013-03-14 | Discharge: 2013-03-19 | DRG: 470 | Disposition: A | Discharge status: Home Health | Payer: 59 | Source: Ambulatory Visit | Attending: Orthopedic Surgery | Admitting: Orthopedic Surgery

## 2013-03-14 DIAGNOSIS — Z96649 Presence of unspecified artificial hip joint: Secondary | ICD-10-CM

## 2013-03-14 DIAGNOSIS — E871 Hypo-osmolality and hyponatremia: Secondary | ICD-10-CM | POA: Diagnosis not present

## 2013-03-14 DIAGNOSIS — E876 Hypokalemia: Secondary | ICD-10-CM | POA: Diagnosis not present

## 2013-03-14 DIAGNOSIS — M1610 Unilateral primary osteoarthritis, unspecified hip: Principal | ICD-10-CM | POA: Diagnosis present

## 2013-03-14 DIAGNOSIS — I4891 Unspecified atrial fibrillation: Secondary | ICD-10-CM | POA: Diagnosis present

## 2013-03-14 DIAGNOSIS — E119 Type 2 diabetes mellitus without complications: Secondary | ICD-10-CM | POA: Diagnosis present

## 2013-03-14 DIAGNOSIS — I10 Essential (primary) hypertension: Secondary | ICD-10-CM | POA: Diagnosis present

## 2013-03-14 DIAGNOSIS — Z823 Family history of stroke: Secondary | ICD-10-CM

## 2013-03-14 DIAGNOSIS — D62 Acute posthemorrhagic anemia: Secondary | ICD-10-CM | POA: Diagnosis not present

## 2013-03-14 DIAGNOSIS — I2510 Atherosclerotic heart disease of native coronary artery without angina pectoris: Secondary | ICD-10-CM | POA: Diagnosis present

## 2013-03-14 DIAGNOSIS — M169 Osteoarthritis of hip, unspecified: Secondary | ICD-10-CM

## 2013-03-14 DIAGNOSIS — E7800 Pure hypercholesterolemia, unspecified: Secondary | ICD-10-CM | POA: Diagnosis present

## 2013-03-14 DIAGNOSIS — M1710 Unilateral primary osteoarthritis, unspecified knee: Secondary | ICD-10-CM | POA: Diagnosis present

## 2013-03-14 DIAGNOSIS — Z6841 Body Mass Index (BMI) 40.0 and over, adult: Secondary | ICD-10-CM

## 2013-03-14 DIAGNOSIS — Z79899 Other long term (current) drug therapy: Secondary | ICD-10-CM

## 2013-03-14 DIAGNOSIS — Z8249 Family history of ischemic heart disease and other diseases of the circulatory system: Secondary | ICD-10-CM

## 2013-03-14 DIAGNOSIS — Z7901 Long term (current) use of anticoagulants: Secondary | ICD-10-CM

## 2013-03-14 DIAGNOSIS — Z882 Allergy status to sulfonamides status: Secondary | ICD-10-CM

## 2013-03-14 DIAGNOSIS — Z91041 Radiographic dye allergy status: Secondary | ICD-10-CM

## 2013-03-14 HISTORY — PX: TOTAL HIP ARTHROPLASTY: SHX124

## 2013-03-14 LAB — GLUCOSE, CAPILLARY
Glucose-Capillary: 135 mg/dL — ABNORMAL HIGH (ref 70–99)
Glucose-Capillary: 120 mg/dL — ABNORMAL HIGH (ref 70–99)
Glucose-Capillary: 149 mg/dL — ABNORMAL HIGH (ref 70–99)
Glucose-Capillary: 142 mg/dL — ABNORMAL HIGH (ref 70–99)
Glucose-Capillary: 167 mg/dL — ABNORMAL HIGH (ref 70–99)

## 2013-03-14 LAB — HEMOGLOBIN A1C
Hgb A1c MFr Bld: 6.6 % — ABNORMAL HIGH (ref <5.7)
Mean Plasma Glucose: 143 mg/dL — ABNORMAL HIGH (ref <117)

## 2013-03-14 LAB — ABO/RH: ABO/RH(D): O NEG

## 2013-03-14 LAB — TYPE AND SCREEN
ABO/RH(D): O NEG
Antibody Screen: NEGATIVE

## 2013-03-14 SURGERY — ARTHROPLASTY, HIP, TOTAL,POSTERIOR APPROACH
Anesthesia: General | Site: Hip | Laterality: Right

## 2013-03-14 MED ORDER — PROPOFOL 10 MG/ML IV BOLUS
INTRAVENOUS | Status: AC
  Filled 2013-03-14: qty 20

## 2013-03-14 MED ORDER — MIDAZOLAM HCL 2 MG/2ML IJ SOLN
INTRAMUSCULAR | Status: AC
  Filled 2013-03-14: qty 2

## 2013-03-14 MED ORDER — PHENYLEPHRINE HCL 10 MG/ML IJ SOLN
INTRAMUSCULAR | Status: DC | PRN
  Administered 2013-03-14 (×3): 80 ug via INTRAVENOUS

## 2013-03-14 MED ORDER — EPHEDRINE SULFATE 50 MG/ML IJ SOLN
INTRAMUSCULAR | Status: DC | PRN
  Administered 2013-03-14 (×5): 10 mg via INTRAVENOUS

## 2013-03-14 MED ORDER — HYDROMORPHONE HCL PF 1 MG/ML IJ SOLN
0.5000 mg | INTRAMUSCULAR | Status: DC | PRN
  Administered 2013-03-14: 0.5 mg via INTRAVENOUS
  Filled 2013-03-14: qty 1

## 2013-03-14 MED ORDER — PANTOPRAZOLE SODIUM 40 MG PO TBEC
40.0000 mg | DELAYED_RELEASE_TABLET | Freq: Every day | ORAL | Status: DC
  Administered 2013-03-15 – 2013-03-18 (×4): 40 mg via ORAL
  Filled 2013-03-14 (×3): qty 1

## 2013-03-14 MED ORDER — SIMVASTATIN 10 MG PO TABS
10.0000 mg | ORAL_TABLET | Freq: Every day | ORAL | Status: DC
  Administered 2013-03-14 – 2013-03-18 (×5): 10 mg via ORAL
  Filled 2013-03-14 (×6): qty 1

## 2013-03-14 MED ORDER — EPHEDRINE SULFATE 50 MG/ML IJ SOLN
INTRAMUSCULAR | Status: AC
  Filled 2013-03-14: qty 1

## 2013-03-14 MED ORDER — ONDANSETRON HCL 4 MG/2ML IJ SOLN: 4.0000 mg | Freq: Four times a day (QID) | INTRAMUSCULAR | Status: DC | PRN

## 2013-03-14 MED ORDER — CHLORHEXIDINE GLUCONATE 4 % EX LIQD
60.0000 mL | Freq: Once | CUTANEOUS | Status: DC
Start: 2013-03-15 — End: 2013-03-14

## 2013-03-14 MED ORDER — ONDANSETRON HCL 4 MG/2ML IJ SOLN
INTRAMUSCULAR | Status: DC | PRN
  Administered 2013-03-14: 4 mg via INTRAVENOUS

## 2013-03-14 MED ORDER — DEXTROSE 5 % IV SOLN
500.0000 mg | Freq: Four times a day (QID) | INTRAVENOUS | Status: DC | PRN
  Filled 2013-03-14: qty 5

## 2013-03-14 MED ORDER — HYDROMORPHONE HCL PF 1 MG/ML IJ SOLN
0.2500 mg | INTRAMUSCULAR | Status: DC | PRN
  Administered 2013-03-14 (×2): 0.5 mg via INTRAVENOUS

## 2013-03-14 MED ORDER — PROMETHAZINE HCL 25 MG/ML IJ SOLN: 6.2500 mg | INTRAMUSCULAR | Status: DC | PRN

## 2013-03-14 MED ORDER — PROPOFOL 10 MG/ML IV BOLUS
INTRAVENOUS | Status: DC | PRN
  Administered 2013-03-14: 200 mg via INTRAVENOUS

## 2013-03-14 MED ORDER — METOCLOPRAMIDE HCL 10 MG PO TABS: 5.0000 mg | ORAL_TABLET | Freq: Three times a day (TID) | ORAL | Status: DC | PRN

## 2013-03-14 MED ORDER — NEOSTIGMINE METHYLSULFATE 1 MG/ML IJ SOLN
INTRAMUSCULAR | Status: DC | PRN
  Administered 2013-03-14: 5 mg via INTRAVENOUS

## 2013-03-14 MED ORDER — CHLORHEXIDINE GLUCONATE 4 % EX LIQD
60.0000 mL | Freq: Once | CUTANEOUS | Status: DC
Start: 2013-03-14 — End: 2013-03-14

## 2013-03-14 MED ORDER — BUPIVACAINE LIPOSOME 1.3 % IJ SUSP
20.0000 mL | INTRAMUSCULAR | Status: DC
  Filled 2013-03-14: qty 20

## 2013-03-14 MED ORDER — BISACODYL 5 MG PO TBEC
5.0000 mg | DELAYED_RELEASE_TABLET | Freq: Every day | ORAL | Status: DC | PRN
  Administered 2013-03-17 – 2013-03-19 (×2): 5 mg via ORAL
  Filled 2013-03-14 (×2): qty 1

## 2013-03-14 MED ORDER — ROCURONIUM BROMIDE 100 MG/10ML IV SOLN
INTRAVENOUS | Status: DC | PRN
  Administered 2013-03-14: 10 mg via INTRAVENOUS
  Administered 2013-03-14: 50 mg via INTRAVENOUS

## 2013-03-14 MED ORDER — HYDROMORPHONE HCL PF 1 MG/ML IJ SOLN
INTRAMUSCULAR | Status: AC
  Administered 2013-03-14: 0.5 mg via INTRAVENOUS
  Filled 2013-03-14: qty 1

## 2013-03-14 MED ORDER — MENTHOL 3 MG MT LOZG: 1.0000 | LOZENGE | OROMUCOSAL | Status: DC | PRN

## 2013-03-14 MED ORDER — ARTIFICIAL TEARS OP OINT
TOPICAL_OINTMENT | OPHTHALMIC | Status: DC | PRN
  Administered 2013-03-14: 1 via OPHTHALMIC

## 2013-03-14 MED ORDER — FENTANYL CITRATE 0.05 MG/ML IJ SOLN
INTRAMUSCULAR | Status: DC | PRN
  Administered 2013-03-14: 50 ug via INTRAVENOUS
  Administered 2013-03-14: 100 ug via INTRAVENOUS
  Administered 2013-03-14 (×2): 50 ug via INTRAVENOUS

## 2013-03-14 MED ORDER — BUPIVACAINE HCL 0.25 % IJ SOLN
INTRAMUSCULAR | Status: DC | PRN
  Administered 2013-03-14: 5 mL

## 2013-03-14 MED ORDER — METHOCARBAMOL 500 MG PO TABS
500.0000 mg | ORAL_TABLET | Freq: Four times a day (QID) | ORAL | Status: DC | PRN
  Administered 2013-03-14 – 2013-03-19 (×12): 500 mg via ORAL
  Filled 2013-03-14 (×12): qty 1

## 2013-03-14 MED ORDER — DOCUSATE SODIUM 100 MG PO CAPS
100.0000 mg | ORAL_CAPSULE | Freq: Two times a day (BID) | ORAL | Status: DC
  Administered 2013-03-14 – 2013-03-19 (×11): 100 mg via ORAL
  Filled 2013-03-14 (×11): qty 1

## 2013-03-14 MED ORDER — LACTATED RINGERS IV SOLN: INTRAVENOUS | Status: DC

## 2013-03-14 MED ORDER — OXYCODONE HCL 5 MG PO TABS
5.0000 mg | ORAL_TABLET | Freq: Once | ORAL | Status: AC | PRN
  Administered 2013-03-14: 5 mg via ORAL

## 2013-03-14 MED ORDER — SODIUM CHLORIDE 0.9 % IJ SOLN
INTRAMUSCULAR | Status: DC | PRN
  Administered 2013-03-14: 13:00:00

## 2013-03-14 MED ORDER — SODIUM CHLORIDE 0.9 % IJ SOLN
INTRAMUSCULAR | Status: AC
  Filled 2013-03-14: qty 10

## 2013-03-14 MED ORDER — CHLORHEXIDINE GLUCONATE 4 % EX LIQD: 60.0000 mL | Freq: Once | CUTANEOUS | Status: DC

## 2013-03-14 MED ORDER — MIDAZOLAM HCL 2 MG/2ML IJ SOLN: 1.0000 mg | INTRAMUSCULAR | Status: DC | PRN

## 2013-03-14 MED ORDER — METOPROLOL TARTRATE 50 MG PO TABS
50.0000 mg | ORAL_TABLET | Freq: Two times a day (BID) | ORAL | Status: DC
  Administered 2013-03-14 – 2013-03-19 (×9): 50 mg via ORAL
  Filled 2013-03-14 (×11): qty 1

## 2013-03-14 MED ORDER — GLYCOPYRROLATE 0.2 MG/ML IJ SOLN
INTRAMUSCULAR | Status: DC | PRN
  Administered 2013-03-14: 1 mg via INTRAVENOUS

## 2013-03-14 MED ORDER — POTASSIUM CHLORIDE IN NACL 20-0.9 MEQ/L-% IV SOLN
INTRAVENOUS | Status: DC
  Administered 2013-03-14: 100 mL/h via INTRAVENOUS
  Filled 2013-03-14 (×3): qty 1000

## 2013-03-14 MED ORDER — PHENOL 1.4 % MT LIQD: 1.0000 | OROMUCOSAL | Status: DC | PRN

## 2013-03-14 MED ORDER — MIDAZOLAM HCL 5 MG/5ML IJ SOLN
INTRAMUSCULAR | Status: DC | PRN
  Administered 2013-03-14: 2 mg via INTRAVENOUS

## 2013-03-14 MED ORDER — BISACODYL 5 MG PO TBEC: 5.0000 mg | DELAYED_RELEASE_TABLET | Freq: Every day | ORAL | Status: DC | PRN

## 2013-03-14 MED ORDER — DEXTROSE 5 % IV SOLN: 3.0000 g | INTRAVENOUS | Status: DC

## 2013-03-14 MED ORDER — ACETAMINOPHEN 325 MG PO TABS: 650.0000 mg | ORAL_TABLET | Freq: Four times a day (QID) | ORAL | Status: DC | PRN

## 2013-03-14 MED ORDER — METHYLPREDNISOLONE ACETATE 80 MG/ML IJ SUSP
INTRAMUSCULAR | Status: AC
  Filled 2013-03-14: qty 1

## 2013-03-14 MED ORDER — PHENYLEPHRINE 40 MCG/ML (10ML) SYRINGE FOR IV PUSH (FOR BLOOD PRESSURE SUPPORT)
PREFILLED_SYRINGE | INTRAVENOUS | Status: AC
  Filled 2013-03-14: qty 10

## 2013-03-14 MED ORDER — ONDANSETRON HCL 4 MG/2ML IJ SOLN
INTRAMUSCULAR | Status: AC
  Filled 2013-03-14: qty 2

## 2013-03-14 MED ORDER — CEFAZOLIN SODIUM-DEXTROSE 2-3 GM-% IV SOLR
2.0000 g | Freq: Four times a day (QID) | INTRAVENOUS | Status: AC
  Administered 2013-03-14 – 2013-03-15 (×2): 2 g via INTRAVENOUS
  Filled 2013-03-14 (×2): qty 50

## 2013-03-14 MED ORDER — SODIUM CHLORIDE 0.9 % IR SOLN
Status: DC | PRN
  Administered 2013-03-14: 1000 mL

## 2013-03-14 MED ORDER — OXYCODONE-ACETAMINOPHEN 5-325 MG PO TABS: 1.0000 | ORAL_TABLET | ORAL | Status: DC | PRN

## 2013-03-14 MED ORDER — METOCLOPRAMIDE HCL 5 MG/ML IJ SOLN: 5.0000 mg | Freq: Three times a day (TID) | INTRAMUSCULAR | Status: DC | PRN

## 2013-03-14 MED ORDER — LIDOCAINE HCL (CARDIAC) 20 MG/ML IV SOLN
INTRAVENOUS | Status: DC | PRN
  Administered 2013-03-14: 100 mg via INTRAVENOUS

## 2013-03-14 MED ORDER — APIXABAN 5 MG PO TABS
5.0000 mg | ORAL_TABLET | Freq: Two times a day (BID) | ORAL | Status: DC
  Administered 2013-03-14 – 2013-03-19 (×10): 5 mg via ORAL
  Filled 2013-03-14 (×12): qty 1

## 2013-03-14 MED ORDER — ROCURONIUM BROMIDE 50 MG/5ML IV SOLN
INTRAVENOUS | Status: AC
  Filled 2013-03-14: qty 1

## 2013-03-14 MED ORDER — BUPIVACAINE HCL (PF) 0.25 % IJ SOLN
INTRAMUSCULAR | Status: AC
  Filled 2013-03-14: qty 30

## 2013-03-14 MED ORDER — DIPHENHYDRAMINE HCL 12.5 MG/5ML PO ELIX: 12.5000 mg | ORAL_SOLUTION | ORAL | Status: DC | PRN

## 2013-03-14 MED ORDER — METFORMIN HCL 500 MG PO TABS
1000.0000 mg | ORAL_TABLET | Freq: Two times a day (BID) | ORAL | Status: DC
  Administered 2013-03-14 – 2013-03-19 (×10): 1000 mg via ORAL
  Filled 2013-03-14 (×13): qty 2

## 2013-03-14 MED ORDER — HYDROCHLOROTHIAZIDE 25 MG PO TABS
25.0000 mg | ORAL_TABLET | Freq: Every day | ORAL | Status: DC
  Administered 2013-03-14 – 2013-03-19 (×6): 25 mg via ORAL
  Filled 2013-03-14 (×6): qty 1

## 2013-03-14 MED ORDER — OXYCODONE HCL 5 MG PO TABS
ORAL_TABLET | ORAL | Status: AC
  Administered 2013-03-14: 5 mg via ORAL
  Filled 2013-03-14: qty 1

## 2013-03-14 MED ORDER — TRANEXAMIC ACID 100 MG/ML IV SOLN
1000.0000 mg | INTRAVENOUS | Status: AC
  Administered 2013-03-14: 1000 mg via INTRAVENOUS
  Filled 2013-03-14: qty 10

## 2013-03-14 MED ORDER — LACTATED RINGERS IV SOLN
INTRAVENOUS | Status: DC | PRN
  Administered 2013-03-14 (×2): via INTRAVENOUS

## 2013-03-14 MED ORDER — METHOCARBAMOL 500 MG PO TABS: 500.0000 mg | ORAL_TABLET | Freq: Four times a day (QID) | ORAL | Status: DC | PRN

## 2013-03-14 MED ORDER — FENTANYL CITRATE 0.05 MG/ML IJ SOLN: 50.0000 ug | Freq: Once | INTRAMUSCULAR | Status: DC

## 2013-03-14 MED ORDER — FENTANYL CITRATE 0.05 MG/ML IJ SOLN
INTRAMUSCULAR | Status: AC
  Filled 2013-03-14: qty 5

## 2013-03-14 MED ORDER — INSULIN ASPART 100 UNIT/ML ~~LOC~~ SOLN
0.0000 [IU] | Freq: Three times a day (TID) | SUBCUTANEOUS | Status: DC
  Administered 2013-03-14: 2 [IU] via SUBCUTANEOUS
  Administered 2013-03-15 – 2013-03-16 (×4): 3 [IU] via SUBCUTANEOUS
  Administered 2013-03-16: 2 [IU] via SUBCUTANEOUS
  Administered 2013-03-16 – 2013-03-17 (×3): 3 [IU] via SUBCUTANEOUS
  Administered 2013-03-17: 5 [IU] via SUBCUTANEOUS
  Administered 2013-03-18: 2 [IU] via SUBCUTANEOUS
  Administered 2013-03-18: 3 [IU] via SUBCUTANEOUS
  Administered 2013-03-18: 5 [IU] via SUBCUTANEOUS
  Administered 2013-03-19: 3 [IU] via SUBCUTANEOUS
  Administered 2013-03-19: 5 [IU] via SUBCUTANEOUS

## 2013-03-14 MED ORDER — OXYCODONE HCL 5 MG/5ML PO SOLN: 5.0000 mg | Freq: Once | ORAL | Status: AC | PRN

## 2013-03-14 MED ORDER — ONDANSETRON HCL 4 MG PO TABS: 4.0000 mg | ORAL_TABLET | Freq: Four times a day (QID) | ORAL | Status: DC | PRN

## 2013-03-14 MED ORDER — ACETAMINOPHEN 650 MG RE SUPP: 650.0000 mg | Freq: Four times a day (QID) | RECTAL | Status: DC | PRN

## 2013-03-14 MED ORDER — OXYCODONE HCL 5 MG PO TABS
5.0000 mg | ORAL_TABLET | ORAL | Status: DC | PRN
  Administered 2013-03-14 – 2013-03-19 (×22): 10 mg via ORAL
  Filled 2013-03-14 (×23): qty 2

## 2013-03-14 MED ORDER — AMIODARONE HCL 200 MG PO TABS
200.0000 mg | ORAL_TABLET | Freq: Every day | ORAL | Status: DC
  Administered 2013-03-15 – 2013-03-19 (×5): 200 mg via ORAL
  Filled 2013-03-14 (×5): qty 1

## 2013-03-14 MED ORDER — METHYLPREDNISOLONE ACETATE 80 MG/ML IJ SUSP
INTRAMUSCULAR | Status: DC | PRN
  Administered 2013-03-14: 80 mg

## 2013-03-14 MED ORDER — INSULIN ASPART 100 UNIT/ML ~~LOC~~ SOLN: 0.0000 [IU] | Freq: Every day | SUBCUTANEOUS | Status: DC

## 2013-03-14 SURGICAL SUPPLY — 59 items
BENZOIN TINCTURE PRP APPL 2/3 (GAUZE/BANDAGES/DRESSINGS) ×2 IMPLANT
BLADE SAW SAG 73X25 THK (BLADE) ×1
BLADE SAW SGTL 73X25 THK (BLADE) ×1 IMPLANT
BRUSH FEMORAL CANAL (MISCELLANEOUS) IMPLANT
COVER SURGICAL LIGHT HANDLE (MISCELLANEOUS) ×2 IMPLANT
DRAPE INCISE IOBAN 66X45 STRL (DRAPES) ×2 IMPLANT
DRAPE ORTHO SPLIT 77X108 STRL (DRAPES) ×2
DRAPE SURG ORHT 6 SPLT 77X108 (DRAPES) ×2 IMPLANT
DRAPE U-SHAPE 47X51 STRL (DRAPES) ×2 IMPLANT
DRILL BIT 7/64X5 (BIT) ×2 IMPLANT
DRSG AQUACEL AG ADV 3.5X10 (GAUZE/BANDAGES/DRESSINGS) ×4 IMPLANT
DRSG MEPILEX BORDER 4X12 (GAUZE/BANDAGES/DRESSINGS) ×2 IMPLANT
DRSG MEPILEX BORDER 4X8 (GAUZE/BANDAGES/DRESSINGS) IMPLANT
DURAPREP 26ML APPLICATOR (WOUND CARE) ×2 IMPLANT
ELECT BLADE 6.5 EXT (BLADE) ×2 IMPLANT
ELECT CAUTERY BLADE 6.4 (BLADE) ×2 IMPLANT
ELECT REM PT RETURN 9FT ADLT (ELECTROSURGICAL) ×2
ELECTRODE REM PT RTRN 9FT ADLT (ELECTROSURGICAL) ×1 IMPLANT
EVACUATOR 1/8 PVC DRAIN (DRAIN) IMPLANT
FACESHIELD LNG OPTICON STERILE (SAFETY) ×6 IMPLANT
GAUZE SPONGE 2X2 8PLY STRL LF (GAUZE/BANDAGES/DRESSINGS) ×1 IMPLANT
GLOVE BIOGEL PI IND STRL 7.0 (GLOVE) ×2 IMPLANT
GLOVE BIOGEL PI INDICATOR 7.0 (GLOVE) ×2
GLOVE ECLIPSE 6.5 STRL STRAW (GLOVE) ×4 IMPLANT
GLOVE ORTHO TXT STRL SZ7.5 (GLOVE) ×4 IMPLANT
GOWN PREVENTION PLUS XLARGE (GOWN DISPOSABLE) ×4 IMPLANT
HANDPIECE INTERPULSE COAX TIP (DISPOSABLE)
HEAD FEMORAL CTAPER HIP 36-5MM (Head) IMPLANT
HIP/CERM HD VIT E LINR LEV 1C ×2 IMPLANT
KIT BASIN OR (CUSTOM PROCEDURE TRAY) ×2 IMPLANT
KIT ROOM TURNOVER OR (KITS) ×2 IMPLANT
MANIFOLD NEPTUNE II (INSTRUMENTS) ×2 IMPLANT
NEEDLE 18GX1X1/2 (RX/OR ONLY) (NEEDLE) ×2 IMPLANT
NS IRRIG 1000ML POUR BTL (IV SOLUTION) ×2 IMPLANT
PACK TOTAL JOINT (CUSTOM PROCEDURE TRAY) ×2 IMPLANT
PAD ARMBOARD 7.5X6 YLW CONV (MISCELLANEOUS) ×6 IMPLANT
PILLOW ABDUCTION HIP (SOFTGOODS) ×2 IMPLANT
PRESSURIZER FEMORAL UNIV (MISCELLANEOUS) IMPLANT
RETRIEVER SUT HEWSON (MISCELLANEOUS) ×2 IMPLANT
SET HNDPC FAN SPRY TIP SCT (DISPOSABLE) IMPLANT
SPONGE GAUZE 2X2 STER 10/PKG (GAUZE/BANDAGES/DRESSINGS) ×1
SPONGE LAP 4X18 X RAY DECT (DISPOSABLE) IMPLANT
STRIP CLOSURE SKIN 1/2X4 (GAUZE/BANDAGES/DRESSINGS) ×2 IMPLANT
SUCTION FRAZIER TIP 10 FR DISP (SUCTIONS) ×2 IMPLANT
SUT FIBERWIRE #2 38 REV NDL BL (SUTURE) ×6
SUT MNCRL AB 4-0 PS2 18 (SUTURE) ×2 IMPLANT
SUT MON AB 2-0 CT1 36 (SUTURE) ×4 IMPLANT
SUT VIC AB 0 CT1 27 (SUTURE) ×1
SUT VIC AB 0 CT1 27XBRD ANBCTR (SUTURE) ×1 IMPLANT
SUT VIC AB 1 CTX 36 (SUTURE) ×2
SUT VIC AB 1 CTX36XBRD ANBCTR (SUTURE) ×2 IMPLANT
SUT VIC AB 2-0 CT1 27 (SUTURE) ×1
SUT VIC AB 2-0 CT1 TAPERPNT 27 (SUTURE) ×1 IMPLANT
SUTURE FIBERWR#2 38 REV NDL BL (SUTURE) ×3 IMPLANT
SYR 50ML LL SCALE MARK (SYRINGE) ×2 IMPLANT
TOWEL OR 17X24 6PK STRL BLUE (TOWEL DISPOSABLE) ×2 IMPLANT
TOWEL OR 17X26 10 PK STRL BLUE (TOWEL DISPOSABLE) ×2 IMPLANT
TOWER CARTRIDGE SMART MIX (DISPOSABLE) IMPLANT
WATER STERILE IRR 1000ML POUR (IV SOLUTION) ×2 IMPLANT

## 2013-03-14 NOTE — Discharge Instructions (Signed)
Total Hip Replacement Care After Refer to this sheet in the next few weeks. These discharge instructions provide you with general information on caring for yourself after you leave the hospital. Your caregiver may also give you specific instructions. Your treatment has been planned according to the most current medical practices available, but unavoidable complications sometimes occur. If you have any problems or questions after discharge, please call your caregiver.  HOME CARE INSTRUCTIONS  You may resume a normal diet and activities as directed.  Perform exercises as directed.  Posterior total hip precautions with weight bearing as tolerated walking You will receive physical therapy daily  Take showers instead of baths until informed otherwise.    Please wash whole leg including wound with soap and water  Change bandages (dressings)daily It is OK to take over-the-counter tylenol in addition to the tramadol for pain, discomfort, or fever. Eat a well-balanced diet.  Avoid lifting or driving until you are instructed otherwise.  Make an appointment to see your caregiver for stitches (suture) or staple removal as directed.   SEEK MEDICAL CARE IF: You have swelling of your calf or leg.  You develop shortness of breath or chest pain.  You have redness, swelling, or increasing pain in the wound.  There is pus or any unusual drainage coming from the surgical site.  You notice a bad smell coming from the surgical site or dressing.  The surgical site breaks open after sutures or staples have been removed.  There is persistent bleeding from the suture or staple line.  You are getting worse or are not improving.  You have any other questions or concerns.  SEEK IMMEDIATE MEDICAL CARE IF:  You have a fever.  You develop a rash.  You have difficulty breathing.  You develop any reaction or side effects to medicines given.  Your knee motion is decreasing rather than improving.  MAKE SURE YOU:    Understand these instructions.  Will watch your condition.  Will get help right away if you are not doing well or get worse.

## 2013-03-14 NOTE — Anesthesia Preprocedure Evaluation (Addendum)
Anesthesia Evaluation  Patient identified by MRN, date of birth, ID band Patient awake    Reviewed: Allergy & Precautions, H&P , NPO status , Patient's Chart, lab work & pertinent test results  Airway Mallampati: II TM Distance: >3 FB Neck ROM: Full    Dental  (+) Teeth Intact, Dental Advidsory Given   Pulmonary shortness of breath,  breath sounds clear to auscultation  + decreased breath sounds      Cardiovascular hypertension, + CAD + dysrhythmias Atrial Fibrillation Rhythm:Regular Rate:Normal     Neuro/Psych    GI/Hepatic   Endo/Other  diabetesMorbid obesity  Renal/GU      Musculoskeletal   Abdominal (+) + obese,   Peds  Hematology   Anesthesia Other Findings   Reproductive/Obstetrics                          Anesthesia Physical Anesthesia Plan  ASA: III  Anesthesia Plan: General   Post-op Pain Management:    Induction: Intravenous  Airway Management Planned: Oral ETT  Additional Equipment:   Intra-op Plan:   Post-operative Plan: Extubation in OR  Informed Consent: I have reviewed the patients History and Physical, chart, labs and discussed the procedure including the risks, benefits and alternatives for the proposed anesthesia with the patient or authorized representative who has indicated his/her understanding and acceptance.   Dental Advisory Given  Plan Discussed with: CRNA, Surgeon and Anesthesiologist  Anesthesia Plan Comments:        Anesthesia Quick Evaluation
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
## 2013-03-14 NOTE — Interval H&P Note (Signed)
History and Physical Interval Note:  03/14/2013 8:01 AM  Lawrence Jordan  has presented today for surgery, with the diagnosis of RIGHT HIP DJD  The various methods of treatment have been discussed with the patient and family. After consideration of risks, benefits and other options for treatment, the patient has consented to  Procedure(s): TOTAL HIP ARTHROPLASTY (Right) as a surgical intervention .  The patient's history has been reviewed, patient examined, no change in status, stable for surgery.  I have reviewed the patient's chart and labs.  Questions were answered to the patient's satisfaction.     Leza Apsey F

## 2013-03-14 NOTE — Transfer of Care (Signed)
Immediate Anesthesia Transfer of Care Note  Patient: Lawrence Jordan  Procedure(s) Performed: Procedure(s) with comments: TOTAL HIP ARTHROPLASTY (Right) - and steroid injection into left knee.  Patient Location: PACU  Anesthesia Type:General  Level of Consciousness: awake, alert  and oriented  Airway & Oxygen Therapy: Patient Spontanous Breathing and Patient connected to face mask oxygen  Post-op Assessment: Report given to PACU RN, Post -op Vital signs reviewed and stable and Patient moving all extremities X 4  Post vital signs: Reviewed and stable  Complications: No apparent anesthesia complications
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
## 2013-03-14 NOTE — Anesthesia Postprocedure Evaluation (Signed)
  Anesthesia Post-op Note  Patient: Lawrence Jordan  Procedure(s) Performed: Procedure(s) with comments: TOTAL HIP ARTHROPLASTY (Right) - and steroid injection into left knee.  Patient Location: PACU  Anesthesia Type:General  Level of Consciousness: awake and alert   Airway and Oxygen Therapy: Patient Spontanous Breathing  Post-op Pain: mild  Post-op Assessment: Post-op Vital signs reviewed, Patient's Cardiovascular Status Stable, Respiratory Function Stable, Patent Airway, No signs of Nausea or vomiting and Pain level controlled  Post-op Vital Signs: Reviewed and stable  Complications: No apparent anesthesia complications

## 2013-03-14 NOTE — Anesthesia Procedure Notes (Signed)
Procedure Name: Intubation Date/Time: 03/14/2013 11:48 AM Performed by: Neldon Newport Pre-anesthesia Checklist: Patient identified, Timeout performed, Emergency Drugs available, Suction available and Patient being monitored Patient Re-evaluated:Patient Re-evaluated prior to inductionOxygen Delivery Method: Circle system utilized Preoxygenation: Pre-oxygenation with 100% oxygen Intubation Type: IV induction Ventilation: Mask ventilation without difficulty Laryngoscope Size: Mac and 3 Grade View: Grade II Tube type: Oral Tube size: 8.0 mm Number of attempts: 1 Placement Confirmation: positive ETCO2,  ETT inserted through vocal cords under direct vision and breath sounds checked- equal and bilateral Secured at: 23 cm Tube secured with: Tape Dental Injury: Teeth and Oropharynx as per pre-operative assessment

## 2013-03-14 NOTE — Addendum Note (Signed)
Addendum created 03/14/13 1531 by Neldon Newport, CRNA   Modules edited: Anesthesia Responsible Staff

## 2013-03-15 LAB — GLUCOSE, CAPILLARY
Glucose-Capillary: 172 mg/dL — ABNORMAL HIGH (ref 70–99)
Glucose-Capillary: 186 mg/dL — ABNORMAL HIGH (ref 70–99)
Glucose-Capillary: 152 mg/dL — ABNORMAL HIGH (ref 70–99)
Glucose-Capillary: 174 mg/dL — ABNORMAL HIGH (ref 70–99)

## 2013-03-15 LAB — CBC
HCT: 35.9 % — ABNORMAL LOW (ref 39.0–52.0)
Hemoglobin: 12.2 g/dL — ABNORMAL LOW (ref 13.0–17.0)
MCH: 32.2 pg (ref 26.0–34.0)
MCHC: 34 g/dL (ref 30.0–36.0)
MCV: 94.7 fL (ref 78.0–100.0)
Platelets: 189 10*3/uL (ref 150–400)
RBC: 3.79 MIL/uL — ABNORMAL LOW (ref 4.22–5.81)
RDW: 13.7 % (ref 11.5–15.5)
WBC: 10.2 10*3/uL (ref 4.0–10.5)

## 2013-03-15 LAB — BASIC METABOLIC PANEL
BUN: 12 mg/dL (ref 6–23)
CO2: 26 mEq/L (ref 19–32)
Calcium: 8.7 mg/dL (ref 8.4–10.5)
Chloride: 96 mEq/L (ref 96–112)
Creatinine, Ser: 0.78 mg/dL (ref 0.50–1.35)
GFR calc Af Amer: >90 mL/min (ref >90)
GFR calc non Af Amer: >90 mL/min (ref >90)
Glucose, Bld: 188 mg/dL — ABNORMAL HIGH (ref 70–99)
Potassium: 4.3 mEq/L (ref 3.7–5.3)
Sodium: 137 mEq/L (ref 137–147)

## 2013-03-15 NOTE — Progress Notes (Signed)
Physical Therapy Treatment Patient Details Name: Lawrence Jordan MRN: [id number] DOB: 1950-03-08 Today's Date: 03/15/2013 Time: [id number] PT Time Calculation (min): 27 min  PT Assessment / Plan / Recommendation  History of Present Illness Pt. had R posterior THA.  Pt. with recent h/o afib/ flutter but currently not on tele monitor   PT Comments   Pt. Still mildly SOB with walking this pm , but perhaps a bit better than in the am.  His sats and HR are stable with activity.  He is moving slowly and I do not believe he will be ready for Dc tomorrow due to his slower rate of  progression.    Follow Up Recommendations  Home health PT;Supervision/Assistance - 24 hour     Does the patient have the potential to tolerate intense rehabilitation     Barriers to Discharge        Equipment Recommendations  Hospital bed;Other (comment)    Recommendations for Other Services    Frequency 7X/week   Progress towards PT Goals Progress towards PT goals: Progressing toward goals  Plan Current plan remains appropriate    Precautions / Restrictions Precautions Precautions: Posterior Hip Restrictions Weight Bearing Restrictions: Yes RLE Weight Bearing: Weight bearing as tolerated   Pertinent Vitals/Pain See vitals tab Initially painful with rising to stand from recliner where he had been sitting for several hours.  Pain limited his mobility.    Mobility  Transfers Overall transfer level: Needs assistance Equipment used: Rolling walker (2 wheeled) Transfers: Sit to/from Stand Sit to Stand: +2 physical assistance;Mod assist General transfer comment: mod assist of 2 to rise to stand from recliner , mod assist of one to sit onto [facility] over toilet (to control descent) .  Empahsi/education on use of UEs to assist self to rise and to control descent Ambulation/Gait Ambulation/Gait assistance: +2 safety/equipment;Mod assist Ambulation Distance (Feet): 25 Feet Assistive device: Rolling walker (2  wheeled) Gait Pattern/deviations: Step-to pattern Gait velocity: decreased General Gait Details: vc's for initial sequencing, mod assist for safety and stability with second person primarily for equipment management.      Exercises     PT Diagnosis:    PT Problem List:   PT Treatment Interventions:     PT Goals (current goals can now be found in the care plan section)    Visit Information  Last PT Received On: 03/15/13 Assistance Needed: +2 History of Present Illness: Pt. had R posterior THA.  Pt. with recent h/o afib/ flutter but currently not on tele monitor    Subjective Data  Subjective: Pt. reports cramping in left thigh; asked to be left on toilet to attempt to have BM   Cognition  Cognition Arousal/Alertness: Awake/alert Behavior During Therapy: WFL for tasks assessed/performed Overall Cognitive Status: Within Functional Limits for tasks assessed    Balance     End of Session PT - End of Session Equipment Utilized During Treatment: Gait belt Activity Tolerance: Patient tolerated treatment well;Other (comment) (with continued mild SOB but less than in am ) Patient left: Other (comment) (on 3n1 over toilet with wife in room) Nurse Communication: Mobility status;Precautions;Weight bearing status   GP     Ladona Ridgel 03/15/2013, 3:08 PM Gerlean Ren PT [facility] [place] [phone]

## 2013-03-15 NOTE — Progress Notes (Addendum)
Inpatient Diabetes Program Recommendations  AACE/ADA: New Consensus Statement on Inpatient Glycemic Control (2013)  Target Ranges:  Prepandial:   less than 140 mg/dL      Peak postprandial:   less than 180 mg/dL (1-2 hours)      Critically ill patients:  140 - 180 mg/dL   Reason for Visit: HgbA1C is 6.6%  Diabetes history: Type 2 diabetes Outpatient Diabetes medications: Victoza 18 mg daily, Metformin 1000 mg BID Current orders for Inpatient glycemic control: Novolog MODERATE correction scale AC & HS, Metformin 1000 mg BID    Note: Will continue to follow while in hospital.  HgbA1C is 6.6%.   Harvel Ricks RN BSN CDE

## 2013-03-15 NOTE — Evaluation (Signed)
Occupational Therapy Evaluation Patient Details Name: Lawrence Jordan MRN: [id number] DOB: 01-Sep-1950 Today's Date: 03/15/2013 Time: [id number] OT Time Calculation (min): 21 min  OT Assessment / Plan / Recommendation History of present illness Pt. had R posterior THA.  Pt. with recent h/o afib/ flutter but currently not on tele monitor   Clinical Impression   Pt admitted with above. Will continue to follow acutely in order to address below problem list. Recommending 24/7 assist at home.    OT Assessment  Patient needs continued OT Services    Follow Up Recommendations  No OT follow up;Supervision/Assistance - 24 hour    Barriers to Discharge      Equipment Recommendations  Hospital bed    Recommendations for Other Services    Frequency  Min 2X/week    Precautions / Restrictions Precautions Precautions: Posterior Hip Precaution Booklet Issued: Yes (comment) Precaution Comments: Educated pt on posterior hip precautions. Required Braces or Orthoses:  (abduction pillow for bed use) Restrictions Weight Bearing Restrictions: Yes RLE Weight Bearing: Weight bearing as tolerated   Pertinent Vitals/Pain See vitals    ADL  Eating/Feeding: Performed;Independent Where Assessed - Eating/Feeding: Chair Transfers/Ambulation Related to ADLs: Pt declined due to fatigue. ADL Comments: Pt fatigued from just finishing with PT session (ambulating in room).  Focus of session on posterior hip education and home safety ed.  Pt reports he feels confident with use of AE for LB ADLs. He dose not have long handled sponge at home. Educated pt on use and acquisition of sponge.  Pt will need a bed for main level of home since his bedroom is located on second level. Will recommend hospital bed for home to be used on main level.    OT Diagnosis: Generalized weakness;Acute pain  OT Problem List: Decreased strength;Decreased activity tolerance;Impaired balance (sitting and/or standing);Decreased knowledge  of use of DME or AE;Decreased knowledge of precautions;Pain OT Treatment Interventions: Self-care/ADL training;DME and/or AE instruction;Therapeutic activities;Patient/family education;Balance training   OT Goals(Current goals can be found in the care plan section) Acute Rehab OT Goals Patient Stated Goal: home for recovery. return to independence OT Goal Formulation: With patient Time For Goal Achievement: 03/22/13 Potential to Achieve Goals: Good  Visit Information  Last OT Received On: 03/15/13 Assistance Needed: +2 History of Present Illness: Pt. had R posterior THA.  Pt. with recent h/o afib/ flutter but currently not on tele monitor       Prior [place] expects to be discharged to:: Private residence Living Arrangements: Spouse/significant other Available Help at Discharge: Family;Available 24 hours/day Type of Home: House Home Access: Level entry Home Layout: Multi-level;Able to live on main level with bedroom/bathroom;1/2 bath on main level Home Equipment: Toilet riser;Walker - 2 wheels;Other (comment);Adaptive equipment Adaptive Equipment: Reacher;Sock aid;Long-handled shoe horn Additional Comments: Pt began using AE with last THA and has used it since.   Prior Function Level of Independence: Independent with assistive device(s) Communication Communication: No difficulties         Vision/Perception     Cognition  Cognition Arousal/Alertness: Awake/alert Behavior During Therapy: WFL for tasks assessed/performed Overall Cognitive Status: Within Functional Limits for tasks assessed    Extremity/Trunk Assessment Upper Extremity Assessment Upper Extremity Assessment: Overall WFL for tasks assessed     Mobility     Exercise Total Joint Exercises Ankle Circles/Pumps: AROM;Both;10 reps Quad Sets: AROM;Both;10 reps Long Arc Quad: AROM;Right;10 reps;Seated   Balance   End of Session OT - End of Session Equipment Utilized  During Treatment: Gait belt;Rolling walker Activity Tolerance: Patient limited by fatigue Patient left: in chair;with call bell/phone within reach;with nursing/sitter in room Nurse Communication: Mobility status  GO    03/15/2013 Darrol Jump OTR/L Pager [phone] Office [phone]  Darrol Jump 03/15/2013, 10:32 AM

## 2013-03-15 NOTE — Care Management Note (Signed)
CARE MANAGEMENT NOTE 03/15/2013  Patient:  Lawrence Jordan, Lawrence Jordan   Account Number:  [id number]  Date Initiated:  03/15/2013  Documentation initiated by:  Ricki Miller  Subjective/Objective Assessment:   63 yr old male s/p right total hip arthroplasty.     Action/Plan:   Case manager spoke with patient concerning home health and DME needs. Patient preoperatively setup with [facility], no changes. Patient states he has to have a hospital bed.   Anticipated DC Date:  03/16/2013   Anticipated DC Plan:  [place]  CM consult      [facility] Choice  HOME HEALTH  DURABLE MEDICAL EQUIPMENT   Choice offered to / List presented to:  C-1 Patient   DME arranged  [place]      DME agency  [employer]     HH arranged  HH-2 PT      [place].   Status of service:  In process, will continue to follow

## 2013-03-15 NOTE — Progress Notes (Signed)
Subjective:  1 Day Post-Op Procedure(s) (LRB): TOTAL HIP ARTHROPLASTY (Right) Patient reports pain as 2 on 0-10 scale.  Had a "bout of pain" last night but has since resolved since getting meds.  No nausea/vomiting.  No flatus and no bm as of yet.  Patient has yet to have much of an appetite.    Objective: Vital signs in last 24 hours: Temp:  [97 F (36.1 C)-98.4 F (36.9 C)] 98 F (36.7 C) (02/19 0517) Pulse Rate:  [59-102] 64 (02/19 0517) Resp:  [12-20] 16 (02/19 0517) BP: (108-142)/(50-84) 108/53 mmHg (02/19 0517) SpO2:  [90 %-99 %] 96 % (02/19 0517)  Intake/Output from previous day: 02/18 0701 - 02/19 0700 In: 2350 [P.O.:600; I.V.:1750] Out: 950 [Urine:450; Blood:500] Intake/Output this shift: Total I/O In: 480 [P.O.:480] Out: 450 [Urine:450]   Recent Labs  03/15/13 0420  HGB 12.2*    Recent Labs  03/15/13 0420  WBC 10.2  RBC 3.79*  HCT 35.9*  PLT 189    Recent Labs  03/15/13 0420  NA 137  K 4.3  CL 96  CO2 26  BUN 12  CREATININE 0.78  GLUCOSE 188*  CALCIUM 8.7   No results found for this basename: LABPT, INR,  in the last 72 hours  Neurologically intact ABD soft Neurovascular intact Sensation intact distally Intact pulses distally Dorsiflexion/Plantar flexion intact Compartment soft  Assessment/Plan: 1 Day Post-Op Procedure(s) (LRB): TOTAL HIP ARTHROPLASTY (Right) Advance diet Up with therapy D/C IV fluids Plan for discharge tomorrow Discharge home with home health  [place], M. Mendel Ryder 03/15/2013, 6:22 AM

## 2013-03-15 NOTE — Evaluation (Signed)
Physical Therapy Evaluation Patient Details Name: Lawrence Jordan MRN: [id number] DOB: 28-Sep-1950 Today's Date: 03/15/2013 Time: [id number] PT Time Calculation (min): 31 min  PT Assessment / Plan / Recommendation History of Present Illness  Pt. had R posterior THA.  Pt. with recent h/o afib/ flutter but currently not on tele monitor  Clinical Impression  This pt. underwent a right THA and presents to PT with anticipated post-op decreased strength, functional mobility and gait.  Pt. Will benefit from acute PT to address these and below issues.  Pt. Had limited activity tolerance due to shortness of breath which he states is his baseline due to his body habitus.  Will work toward [place] home tomorrow but will depend on his rate of progress.        PT Assessment  Patient needs continued PT services    Follow Up Recommendations  Home health PT;Supervision/Assistance - 24 hour    Does the patient have the potential to tolerate intense rehabilitation      Barriers to Discharge        Equipment Recommendations  Hospital bed;Other (comment) (pt. needs due to all bedrooms upstairs in 3 level home)    Recommendations for Other Services     Frequency 7X/week    Precautions / Restrictions Precautions Precautions: Posterior Hip Precaution Booklet Issued: Yes (comment) Precaution Comments: Educated pt. on posterior hip precautions and provided handout.  Also provided exercise handout and educated pt. AP, QS and LAQs  Required Braces or Orthoses:  (abduction pillow for bed use) Restrictions Weight Bearing Restrictions: Yes RLE Weight Bearing: Weight bearing as tolerated   Pertinent Vitals/Pain See vitals tab Sats and HR stable during session with max HR 86 and sats in upper 90s throughout on RA.  O2 reapplied following session.          Mobility  Bed Mobility Overal bed mobility:  (pt. up at EOB upon PT arrival) Transfers Overall transfer level: Needs assistance Equipment used:  Rolling walker (2 wheeled) Transfers: Sit to/from Stand Sit to Stand: +2 physical assistance;Mod assist;From elevated surface General transfer comment: cues for hand placement, R LE positioning and assist to rise to stand as well as to control descent Ambulation/Gait Ambulation/Gait assistance: +2 physical assistance;Min assist Ambulation Distance (Feet): 12 Feet Assistive device: Rolling walker (2 wheeled) Gait Pattern/deviations: Step-to pattern;Trunk flexed;Decreased step length - left;Decreased step length - right;Antalgic Gait velocity: decreased General Gait Details: Needed cueing for sequencing and assist for safety/stability    Exercises Total Joint Exercises Ankle Circles/Pumps: AROM;Both;10 reps Quad Sets: AROM;Both;10 reps Long Arc Quad: AROM;Right;10 reps;Seated   PT Diagnosis: Difficulty walking;Acute pain  PT Problem List: Decreased strength;Decreased activity tolerance;Decreased balance;Decreased mobility;Decreased knowledge of use of DME;Decreased knowledge of precautions;Obesity;Pain PT Treatment Interventions: DME instruction;Gait training;Functional mobility training;Therapeutic activities;Therapeutic exercise;Balance training;Patient/family education     PT Goals(Current goals can be found in the care plan section) Acute Rehab PT Goals Patient Stated Goal: home for recovery. return to independence PT Goal Formulation: With patient Time For Goal Achievement: 03/22/13 Potential to Achieve Goals: Good  Visit Information  Last PT Received On: 03/15/13 Assistance Needed: +2 History of Present Illness: Pt. had R posterior THA.  Pt. with recent h/o afib/ flutter but currently not on tele monitor       Prior [place] expects to be discharged to:: Private residence Living Arrangements: Spouse/significant other Available Help at Discharge: Family;Available 24 hours/day Type of Home: House Home Access: Level entry Home Layout:  Multi-level;Able to live on  main level with bedroom/bathroom;1/2 bath on main level Home Equipment: Toilet riser;Walker - 2 wheels;Other (comment) (pt. needs to confirm that it is a RW, not SW) Prior Function Level of Independence: Independent with assistive device(s) (single point cane) Communication Communication: No difficulties    Cognition  Cognition Arousal/Alertness: Awake/alert Behavior During Therapy: WFL for tasks assessed/performed Overall Cognitive Status: Within Functional Limits for tasks assessed    Extremity/Trunk Assessment Upper Extremity Assessment Upper Extremity Assessment: Overall WFL for tasks assessed Lower Extremity Assessment Lower Extremity Assessment: RLE deficits/detail RLE Deficits / Details: good ankle pump and quad set, fair LAQ Cervical / Trunk Assessment Cervical / Trunk Assessment: Normal   Balance Balance Overall balance assessment: Needs assistance Sitting-balance support: Bilateral upper extremity supported;Feet supported Sitting balance-Leahy Scale: Poor Sitting balance - Comments: pt. with heavy reliance on UEs to maintain sitting at edge of bed but no external assist Standing balance support: Bilateral upper extremity supported;During functional activity Standing balance-Leahy Scale: Poor Standing balance comment: Reliance on RW to maintain standing  End of Session PT - End of Session Equipment Utilized During Treatment: Gait belt Activity Tolerance: Patient limited by fatigue;Other (comment) (limited by SOB, he reports as his baseline) Patient left: in chair;with call bell/phone within reach Nurse Communication: Mobility status;Precautions;Weight bearing status  GP     Ladona Ridgel 03/15/2013, 9:51 AM Gerlean Ren PT Acute Rehab Services [phone] Beeper [phone]

## 2013-03-16 ENCOUNTER — Encounter (HOSPITAL_COMMUNITY): Payer: Self-pay | Admitting: Orthopedic Surgery

## 2013-03-16 DIAGNOSIS — M1610 Unilateral primary osteoarthritis, unspecified hip: Secondary | ICD-10-CM

## 2013-03-16 DIAGNOSIS — Z96649 Presence of unspecified artificial hip joint: Secondary | ICD-10-CM

## 2013-03-16 DIAGNOSIS — M1710 Unilateral primary osteoarthritis, unspecified knee: Secondary | ICD-10-CM

## 2013-03-16 DIAGNOSIS — IMO0002 Reserved for concepts with insufficient information to code with codable children: Secondary | ICD-10-CM

## 2013-03-16 LAB — BASIC METABOLIC PANEL
BUN: 14 mg/dL (ref 6–23)
CO2: 26 mEq/L (ref 19–32)
Calcium: 8.7 mg/dL (ref 8.4–10.5)
Chloride: 91 mEq/L — ABNORMAL LOW (ref 96–112)
Creatinine, Ser: 0.84 mg/dL (ref 0.50–1.35)
GFR calc Af Amer: >90 mL/min (ref >90)
GFR calc non Af Amer: >90 mL/min (ref >90)
Glucose, Bld: 177 mg/dL — ABNORMAL HIGH (ref 70–99)
Potassium: 3.6 mEq/L — ABNORMAL LOW (ref 3.7–5.3)
Sodium: 132 mEq/L — ABNORMAL LOW (ref 137–147)

## 2013-03-16 LAB — CBC
HCT: 31.4 % — ABNORMAL LOW (ref 39.0–52.0)
Hemoglobin: 10.8 g/dL — ABNORMAL LOW (ref 13.0–17.0)
MCH: 32.5 pg (ref 26.0–34.0)
MCHC: 34.4 g/dL (ref 30.0–36.0)
MCV: 94.6 fL (ref 78.0–100.0)
Platelets: 147 10*3/uL — ABNORMAL LOW (ref 150–400)
RBC: 3.32 MIL/uL — ABNORMAL LOW (ref 4.22–5.81)
RDW: 13.7 % (ref 11.5–15.5)
WBC: 10.2 10*3/uL (ref 4.0–10.5)

## 2013-03-16 LAB — GLUCOSE, CAPILLARY
Glucose-Capillary: 171 mg/dL — ABNORMAL HIGH (ref 70–99)
Glucose-Capillary: 153 mg/dL — ABNORMAL HIGH (ref 70–99)
Glucose-Capillary: 158 mg/dL — ABNORMAL HIGH (ref 70–99)
Glucose-Capillary: 169 mg/dL — ABNORMAL HIGH (ref 70–99)

## 2013-03-16 NOTE — Progress Notes (Signed)
Physical Therapy Treatment Patient Details Name: Lawrence Jordan MRN: [id number] DOB: 12/12/1950 Today's Date: 03/16/2013 Time: [id number] PT Time Calculation (min): 32 min  PT Assessment / Plan / Recommendation  History of Present Illness Pt. had R posterior THA.  Pt. with recent h/o afib/ flutter but currently not on tele monitor   PT Comments   This patient is having great difficulty with bed mobility , moderate difficulty with rising to stand from bed and with need for elevating height of bed.  He is slowly progressing with his gait training, but is less  SOB today as he was yesterday with his activity.  Pt. Is concerned that he and his wife will not be able to manage at home given his current bed mobility and transfers status.  I am also concerned about this and his slower rate of progress.  Pt. Will benefit from a short CIR stay to maximize his independence functionally for safe return home.  Of note, he indicates he is having some difficulty getting flow of urine to start.    Follow Up Recommendations  CIR     Does the patient have the potential to tolerate intense rehabilitation     Barriers to Discharge        Equipment Recommendations  Hospital bed (pt. needs due to all bedrooms upstairs)    Recommendations for Other Services Rehab consult  Frequency 7X/week   Progress towards PT Goals Progress towards PT goals: Progressing toward goals  Plan Discharge plan needs to be updated    Precautions / Restrictions Precautions Precautions: Posterior Hip Precaution Comments: reinforced posterior hip precautions Restrictions Weight Bearing Restrictions: Yes RLE Weight Bearing: Weight bearing as tolerated Other Position/Activity Restrictions: Pt. presented in bed with bed pillow between legs   Pertinent Vitals/Pain See vitals tab Pt. Denies pain at rest but notes increase in pain level to 6/10 R hip  during bed mobility    Mobility  Bed Mobility Overal bed mobility: +2 for  physical assistance General bed mobility comments: Pt. was able to begin to boost and scoot to R side of bed with 1 mod assist.  Once he got close enough to edge of bed, pt. needed 2 max assist to transition to sitting, and with his use of bed rail.  Pt. needed facilitation at shoulders and at lower body Transfers Overall transfer level: Needs assistance Equipment used: Rolling walker (2 wheeled) Transfers: Sit to/from Stand Sit to Stand: From elevated surface;+2 physical assistance;Mod assist (bed raised ~ 6" ; mod assist from 1, min assist from other) General transfer comment: Pt. needed height of bed raised about 6" and needed mod assist of PT and min assist of tech to extend L LE to stand.  Cues for hand placement and technique Ambulation/Gait Ambulation/Gait assistance: Min assist;+2 safety/equipment Ambulation Distance (Feet): 30 Feet Assistive device: Rolling walker (2 wheeled) Gait Pattern/deviations: Step-to pattern;Decreased stance time - right;Decreased stride length Gait velocity: decreased Gait velocity interpretation: Below normal speed for age/gender General Gait Details: Pt. needed initial cueing for estabilishing sequencing, safe RW distance and min assist for safety and stability.  Second person for bringing recliner for pt's early fatigue.  Pt needed 2 standing rest breaks within short distance due to overall deconditioning  and body habitus.    Exercises Total Joint Exercises Ankle Circles/Pumps: AROM;Both;10 reps Quad Sets: AROM;Both;10 reps Short Arc Quad: AROM;Right;10 reps;Supine Heel Slides: AAROM;Right;10 reps;Supine   PT Diagnosis:    PT Problem List:   PT Treatment Interventions:  PT Goals (current goals can now be found in the care plan section)    Visit Information  Last PT Received On: 03/16/13 Assistance Needed: +2 History of Present Illness: Pt. had R posterior THA.  Pt. with recent h/o afib/ flutter but currently not on tele monitor     Subjective Data  Subjective: Pt. again asked to be left sitting on 3n1 over toilet following therapy, in order to htry to have BM   Cognition  Cognition Arousal/Alertness: Awake/alert Behavior During Therapy: WFL for tasks assessed/performed Overall Cognitive Status: Within Functional Limits for tasks assessed    Balance  Balance Sitting balance - Comments: Pt. with continued need for bilateral UE supprot at edge of bed to maintain sitting upright.   Standing balance support: Bilateral upper extremity supported;During functional activity Standing balance-Leahy Scale: Poor Standing balance comment: reliance on RW to maintain standing balance  End of Session PT - End of Session Equipment Utilized During Treatment: Gait belt Activity Tolerance: Patient tolerated treatment well;Patient limited by fatigue Patient left: in chair;with call bell/phone within reach Nurse Communication: Mobility status;Precautions;Weight bearing status   GP     Ladona Ridgel 03/16/2013, 10:36 AM Gerlean Ren PT Acute Rehab Services [phone] [facility] [phone]

## 2013-03-16 NOTE — Progress Notes (Addendum)
Occupational Therapy Treatment Patient Details Name: Lawrence Jordan MRN: [id number] DOB: [birth year] Today's Date: 03/16/2013 Time: [id number] OT Time Calculation (min): 23 min  OT Assessment / Plan / Recommendation  History of present illness Pt. had R posterior THA.  Pt. with recent h/o afib/ flutter but currently not on tele monitor   OT comments  This 63 yo making progress slowly. Will benefit from continued OT.  Follow Up Recommendations  CIR       Equipment Recommendations  Hospital bed       Frequency Min 2X/week   Progress towards OT Goals Progress towards OT goals: Progressing toward goals  Plan Discharge plan needs to be updated    Precautions / Restrictions Precautions Precautions: Posterior Hip Precaution Comments: Pt only able to tell me 1:3 hip precautions (had to remind him of no turning foot in and no bending past 90 degrees) Restrictions Weight Bearing Restrictions: Yes RLE Weight Bearing: Weight bearing as tolerated Other Position/Activity Restrictions: Pt. presented in bed with bed pillow between legs   Pertinent Vitals/Pain 6/10 RLE; repositioned    ADL  ADL Comments: In to see pt for toilet transfer and adherence to hip precautions. Acquired a wide 3n1 for pt to use while he is here. Set this up for pt in his room. Asked pt about going in to try this new 3n1 out and he politely declined saying that he knows that it will work for him. Did show pt a leg lifter due to in ortho note from today it says one if his big issues is being able to get in and OOB. Pt able to use leg lifter to A in lowering his leg down from recliner (Mod I) and then to help him get into bed (min A with leg lifter)     OT Goals(current goals can now be found in the care plan section)    Visit Information  Last OT Received On: 03/16/13 Assistance Needed: +1 History of Present Illness: Pt. had R posterior THA.  Pt. with recent h/o afib/ flutter but currently not on tele monitor         Cognition  Cognition Arousal/Alertness: Awake/alert Behavior During Therapy: WFL for tasks assessed/performed Overall Cognitive Status: Within Functional Limits for tasks assessed    Mobility  Bed Mobility Overal bed mobility: Needs Assistance Bed Mobility: Sit to Supine Sit to supine: Min assist (with leg lifter) Transfers Overall transfer level: Needs assistance Equipment used: Rolling walker (2 wheeled) Transfers: Sit to/from Stand Sit to Stand: Min guard (from recliner with increased time and one hand on recliner and one had on RW)          End of Session OT - End of Session Equipment Utilized During Treatment: Rolling walker Activity Tolerance:  (DOE) Patient left: in bed;with call bell/phone within reach       [name] [id number]  03/16/2013, 11:39 AM

## 2013-03-16 NOTE — Progress Notes (Signed)
Subjective: 2 Days Post-Op Procedure(s) (LRB): TOTAL HIP ARTHROPLASTY (Right) Patient reports pain as 3 on 0-10 scale.  Patient admits to greatly increased pain with PT.  He does not feel like he can get in/out of bed on his own and is concerned about going home with HHPT.  No nausea/vomiting.  Positive flatus, but no bm as of yet.  Still not much of an appetite.    Objective: Vital signs in last 24 hours: Temp:  [97.8 F (36.6 C)-98.2 F (36.8 C)] 98.1 F (36.7 C) (02/20 0528) Pulse Rate:  [63-69] 64 (02/20 0528) Resp:  [16-20] 18 (02/20 0528) BP: (125-141)/(61-67) 125/61 mmHg (02/20 0528) SpO2:  [93 %-98 %] 95 % (02/20 0528)  Intake/Output from previous day: 02/19 0701 - 02/20 0700 In: 720 [P.O.:720] Out: 1550 [Urine:1550] Intake/Output this shift:     Recent Labs  03/15/13 0420 03/16/13 0414  HGB 12.2* 10.8*    Recent Labs  03/15/13 0420 03/16/13 0414  WBC 10.2 10.2  RBC 3.79* 3.32*  HCT 35.9* 31.4*  PLT 189 147*    Recent Labs  03/15/13 0420 03/16/13 0414  NA 137 132*  K 4.3 3.6*  CL 96 91*  CO2 26 26  BUN 12 14  CREATININE 0.78 0.84  GLUCOSE 188* 177*  CALCIUM 8.7 8.7   No results found for this basename: LABPT, INR,  in the last 72 hours  Neurologically intact ABD soft Neurovascular intact Sensation intact distally Intact pulses distally Dorsiflexion/Plantar flexion intact Incision: scant drainage No cellulitis present Compartment soft  Assessment/Plan: 2 Days Post-Op Procedure(s) (LRB): TOTAL HIP ARTHROPLASTY (Right) Advance diet Up with therapy Will consult social work today as patient has been thinking about going to SNF. D/C today if going home and is doing well with PT D/C tomorrow if opts for SNF  ANTON, M. LINDSEY 03/16/2013, 7:57 AM

## 2013-03-16 NOTE — Progress Notes (Signed)
I will begin insurance authorization for a possible inpt rehab admission Monday pending medical readiness and insurance approval. [phone]

## 2013-03-16 NOTE — Progress Notes (Signed)
Rehab Admissions Coordinator Note:  Patient was screened by Cleatrice Burke for appropriateness for an Inpatient Acute Rehab Consult.  At this time, we are recommending Inpatient Rehab consult. I spoke with onsite [place] healthcare representative. I will contact RN CM.  Cleatrice Burke 03/16/2013, 11:03 AM  I can be reached at [phone].
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
## 2013-03-16 NOTE — Op Note (Signed)
Lawrence Jordan, Lawrence Jordan NO.:  [id number]  MEDICAL RECORD NO.:  [id number]  LOCATION:  [id number]                        FACILITY:  [facility]  PHYSICIAN:  Ninetta Lights, M.D. DATE OF BIRTH:  1950-05-21  DATE OF PROCEDURE:  03/14/2013 DATE OF DISCHARGE:                              OPERATIVE REPORT   PREOPERATIVE DIAGNOSES: 1. End-stage degenerative arthritis, left hip, with marked collapse     and shortening. 2. Approaching end-stage arthritis, left knee.  POSTOPERATIVE DIAGNOSES: 1. End-stage degenerative arthritis, left hip, with marked collapse     and shortening. 2. Approaching end-stage arthritis, left knee. 3. Morbid obesity with a body mass index of 45.  PROCEDURES:  Left total hip replacement, posterolateral approach, Stryker prosthesis.  Everything more difficult secondary to morbid obesity.  A press-fit 60-mm acetabular component screw fixation x2.  A 36-mm internal diameter liner.  A press-fit #8 Secur-Fit femoral component.  36 +0 head, utilizing a Biolox head.  SURGEON:  Ninetta Lights, MD  ASSISTANT:  Eula Listen, PA, present throughout the entire case and necessary for timely completion of procedure.  ANESTHESIA:  General.  ESTIMATED BLOOD LOSS:  500 mL.  BLOOD GIVEN:  None.  SPECIMENS:  None.  CULTURES:  None.  COMPLICATIONS:  None.  DRESSINGS:  Soft compressive abduction pillow.  DESCRIPTION OF PROCEDURE:  The patient was brought to the operating room and after adequate anesthesia had been obtained, the left knee was injected intra-articularly with Depo-Medrol Marcaine under sterile technique.  Turned to a lateral position, left side up.  Prepped and draped, supported in the usual sterile fashion.  Incision along the shaft of femur extending posterosuperior.  Skin and subcutaneous tissue divided.  Generous dilator divided.  Iliotibial band exposed and incised.  Charnley retractor put in place.  Very stiff hip.  External rotator  capsule taken down off the back of the intertrochanteric groove of the femur, tagged with FiberWire.  Hip dislocated posteriorly. Femoral neck was cut one fingerbreadth above the lesser trochanter. Acetabulum exposed.  Redundant debris, loose bodies, labrum debrided. Brought up to good bleeding bone.  Sufficient medial and inferior placement.  Fitted with a 60-mm cup at 40 degrees of abduction, 20 degrees of anteversion.  Good capturing and fixation augmented with two screws through the cup.  36-mm internal diameter liner with a 10-degree overhang posterosuperior.  Attention turned to the femur.  Canal entered.  Distal and proximal reamers brought up to good fitting and sizing, eventually fitted with a Secur-Fit #8 component.  With the 36 +0 Biolox head, excellent stability, good leg lengths.  Full motion confirmed.  Wound thoroughly irrigated.  Soft tissues injected with Exparel.  Charnley retractor removed.  Iliotibial band closed with #1 Vicryl.  Skin and subcutaneous tissue closed with subcutaneous subcuticular closure.  Margins were injected with Marcaine.  Sterile compressive dressing applied.  Returned to supine position.  Anesthesia reversed.  Brought to the recovery room.  Tolerated the surgery well. No complications.     Ninetta Lights, M.D.     DFM/MEDQ  D:  03/15/2013  T:  03/16/2013  Job:  [id number]

## 2013-03-16 NOTE — Progress Notes (Signed)
Physical Therapy Treatment Patient Details Name: Lawrence Jordan MRN: [id number] DOB: 07/04/1950 Today's Date: 03/16/2013 Time: [id number] PT Time Calculation (min): 24 min  PT Assessment / Plan / Recommendation  History of Present Illness Pt. had R posterior THA.  Pt. with recent h/o afib/ flutter but currently not on tele monitor   PT Comments   Pt. Still needing a great deal of assist with bed mobility but beginning to improve with sit<>stand. Pt. Asking for information on DC plans.  I told him that the rehab physician would evaluate him for appropriateness for possible rehab stay, and that if he is not appropriate for rehab, he will likely Dc to SNF when bed is available.  He could benefit greatly from a short rehab stay before home so that he can return to there with improved independence and to lessen burden on wife.    Follow Up Recommendations  CIR     Does the patient have the potential to tolerate intense rehabilitation     Barriers to Discharge        Equipment Recommendations  Hospital bed (if home DC planned due to all bedrooms upstairs)    Recommendations for Other Services    Frequency 7X/week   Progress towards PT Goals Progress towards PT goals: Progressing toward goals  Plan Current plan remains appropriate    Precautions / Restrictions Precautions Precautions: Posterior Hip Restrictions Weight Bearing Restrictions: Yes RLE Weight Bearing: Weight bearing as tolerated   Pertinent Vitals/Pain See vitals tab     Mobility  Bed Mobility Overal bed mobility: Needs Assistance Bed Mobility: Supine to Sit Supine to sit: +2 for physical assistance;Mod assist General bed mobility comments: Pt. practiced moving to edge of bed with use of bed sheet for moving his own R LE.  Pt. required 10 minutes to get to edge of bed  with several rest stops then 2 mod assist at shoulders and hips to elevate to sitting position.   Transfers Overall transfer level: Needs  assistance Equipment used: Rolling walker (2 wheeled) Transfers: Sit to/from Stand Sit to Stand: Min assist;From elevated surface (bed elevated ) General transfer comment: reminder for use of at least one hand to push from bed and one hand on RW.  Min assist to assist in power up to stand Ambulation/Gait Ambulation/Gait assistance: Min assist Ambulation Distance (Feet): 30 Feet Assistive device: Rolling walker (2 wheeled) Gait Pattern/deviations: Step-to pattern;Decreased stance time - right Gait velocity: decreased General Gait Details: Pt. with early fatigue and able to tolerate only 30' before needing to return to sit on commode    Exercises     PT Diagnosis:    PT Problem List:   PT Treatment Interventions:     PT Goals (current goals can now be found in the care plan section)    Visit Information  Last PT Received On: 03/16/13 Assistance Needed: +2 History of Present Illness: Pt. had R posterior THA.  Pt. with recent h/o afib/ flutter but currently not on tele monitor    Subjective Data  Subjective: Pt. requested to be left sitting on 3n1 over toilet.  Says he was not able to have a BM earlier when he tried.     Cognition  Cognition Arousal/Alertness: Awake/alert Behavior During Therapy: WFL for tasks assessed/performed Overall Cognitive Status: Within Functional Limits for tasks assessed    Balance     End of Session PT - End of Session Equipment Utilized During Treatment: Gait belt Activity Tolerance: Patient tolerated treatment well;Patient  limited by fatigue Patient left: Other (comment) (sitting on 3n1 over toilet) Nurse Communication: Mobility status   GP     Ladona Ridgel 03/16/2013, 2:59 PM  Gerlean Ren PT Acute Rehab Services [place] [phone]

## 2013-03-16 NOTE — Care Management Note (Signed)
:    03/16/13 10:00am Ricki Miller, RN BSN Case manager [phone] Patient is being screened by CIR for possible admission.

## 2013-03-17 LAB — CBC
HCT: 31.3 % — ABNORMAL LOW (ref 39.0–52.0)
Hemoglobin: 10.8 g/dL — ABNORMAL LOW (ref 13.0–17.0)
MCH: 32.3 pg (ref 26.0–34.0)
MCHC: 34.5 g/dL (ref 30.0–36.0)
MCV: 93.7 fL (ref 78.0–100.0)
Platelets: 157 10*3/uL (ref 150–400)
RBC: 3.34 MIL/uL — ABNORMAL LOW (ref 4.22–5.81)
RDW: 13.2 % (ref 11.5–15.5)
WBC: 9.7 10*3/uL (ref 4.0–10.5)

## 2013-03-17 LAB — GLUCOSE, CAPILLARY
Glucose-Capillary: 170 mg/dL — ABNORMAL HIGH (ref 70–99)
Glucose-Capillary: 209 mg/dL — ABNORMAL HIGH (ref 70–99)
Glucose-Capillary: 159 mg/dL — ABNORMAL HIGH (ref 70–99)
Glucose-Capillary: 183 mg/dL — ABNORMAL HIGH (ref 70–99)

## 2013-03-17 LAB — BASIC METABOLIC PANEL
BUN: 15 mg/dL (ref 6–23)
CO2: 25 mEq/L (ref 19–32)
Calcium: 8.5 mg/dL (ref 8.4–10.5)
Chloride: 90 mEq/L — ABNORMAL LOW (ref 96–112)
Creatinine, Ser: 0.71 mg/dL (ref 0.50–1.35)
GFR calc Af Amer: >90 mL/min (ref >90)
GFR calc non Af Amer: >90 mL/min (ref >90)
Glucose, Bld: 168 mg/dL — ABNORMAL HIGH (ref 70–99)
Potassium: 3.4 mEq/L — ABNORMAL LOW (ref 3.7–5.3)
Sodium: 131 mEq/L — ABNORMAL LOW (ref 137–147)

## 2013-03-17 NOTE — Progress Notes (Signed)
Physical Therapy Treatment Patient Details Name: Lawrence Jordan MRN: [id number] DOB: 06/05/1950 Today's Date: 03/17/2013 Time: [id number] PT Time Calculation (min): 29 min  PT Assessment / Plan / Recommendation  History of Present Illness Pt. had R posterior THA.  Pt. with recent h/o afib/ flutter but currently not on tele monitor   PT Comments   Pt making steady progress with therapy.   Follow Up Recommendations  CIR     Equipment Recommendations  Hospital bed    Recommendations for Other Services Rehab consult  Frequency 7X/week   Progress towards PT Goals Progress towards PT goals: Progressing toward goals  Plan Current plan remains appropriate    Precautions / Restrictions Precautions Precautions: Posterior Hip Precaution Comments: pt able to recall 3/3 posterior hip precautions without cues/assistance needed Restrictions RLE Weight Bearing: Weight bearing as tolerated       Mobility  Bed Mobility Overal bed mobility: Needs Assistance Supine to sit: Min guard Sit to supine: Min guard General bed mobility comments: used blue leg lifter with hob elevated to get to edge of bed, cues on techqniue and sequence with no physical assistance needed.  same with sitting to supine in bed. pt reports he will have a hospital bed at home so did allow rail use and elevated on Hob. Transfers Overall transfer level: Needs assistance Equipment used: Rolling walker (2 wheeled) Transfers: Sit to/from Stand Sit to Stand: Min guard General transfer comment: bed elevated to assist with standing, cues on hand placement. Ambulation/Gait Ambulation/Gait assistance: Min guard Ambulation Distance (Feet): 40 Feet Assistive device: Rolling walker (2 wheeled) Gait Pattern/deviations: Step-to pattern;Antalgic Gait velocity: decreased Gait velocity interpretation: Below normal speed for age/gender General Gait Details: cues for sequence and walker use with gait.     Exercises Total Joint  Exercises Ankle Circles/Pumps: AROM;Both;10 reps;Supine Quad Sets: AROM;Strengthening;Right;10 reps;Supine Heel Slides: AAROM;Strengthening;Right;10 reps;Supine Hip ABduction/ADduction: AAROM;Strengthening;Right;10 reps;Supine     PT Goals (current goals can now be found in the care plan section) Acute Rehab PT Goals Patient Stated Goal: home for recovery. return to independence PT Goal Formulation: With patient Time For Goal Achievement: 03/22/13 Potential to Achieve Goals: Good  Visit Information  Last PT Received On: 03/17/13 Assistance Needed: +1 History of Present Illness: Pt. had R posterior THA.  Pt. with recent h/o afib/ flutter but currently not on tele monitor    Subjective Data  Patient Stated Goal: home for recovery. return to independence   Cognition  Cognition Arousal/Alertness: Awake/alert Behavior During Therapy: WFL for tasks assessed/performed Overall Cognitive Status: Within Functional Limits for tasks assessed       End of Session PT - End of Session Equipment Utilized During Treatment: Gait belt Activity Tolerance: Patient tolerated treatment well Patient left: in bed;with call bell/phone within reach Nurse Communication: Mobility status   GP     Willow Ora 03/17/2013, 2:55 PM  Willow Ora, PTA Office- [phone]

## 2013-03-17 NOTE — Progress Notes (Signed)
SPORTS MEDICINE AND JOINT REPLACEMENT  Lara Mulch, MD   Carlynn Spry, PA-C [address], [place], [place]  [address]                             [phone]   PROGRESS NOTE  Subjective:  negative for Chest Pain  negative for Shortness of Breath  negative for Nausea/Vomiting   negative for Calf Pain  negative for Bowel Movement   Tolerating Diet: yes         Patient reports pain as 3 on 0-10 scale.    Objective: Vital signs in last 24 hours:   Patient Vitals for the past 24 hrs:  BP Temp Temp src Pulse Resp SpO2  03/17/13 0541 125/82 mmHg 97.4 F (36.3 C) - 110 18 98 %  03/17/13 0335 - - - - 16 100 %  03/17/13 0000 - - - - 16 100 %  03/16/13 2022 100/64 mmHg 98.5 F (36.9 C) - 93 18 97 %  03/16/13 2000 - - - - 20 95 %  03/16/13 1314 120/84 mmHg 97.8 F (36.6 C) Oral 53 16 96 %  03/16/13 1101 152/78 mmHg - - - - -  03/16/13 0950 152/78 mmHg 97.9 F (36.6 C) Oral 72 20 92 %    @flow {1959:LAST@   Intake/Output from previous day:   02/20 0701 - 02/21 0700 In: 720 [P.O.:720] Out: 1725 [Urine:1725]   Intake/Output this shift:   02/21 0701 - 02/21 1900 In: 360 [P.O.:360] Out: -    Intake/Output     02/20 0701 - 02/21 0700 02/21 0701 - 02/22 0700   P.O. 720 360   Total Intake(mL/kg) 720 (5.1) 360 (2.6)   Urine (mL/kg/hr) 1725 (0.5)    Total Output 1725     Net -1005 +360           LABORATORY DATA:  Recent Labs  03/15/13 0420 03/16/13 0414 03/17/13 0430  WBC 10.2 10.2 9.7  HGB 12.2* 10.8* 10.8*  HCT 35.9* 31.4* 31.3*  PLT 189 147* 157    Recent Labs  03/15/13 0420 03/16/13 0414 03/17/13 0430  NA 137 132* 131*  K 4.3 3.6* 3.4*  CL 96 91* 90*  CO2 26 26 25   BUN 12 14 15   CREATININE 0.78 0.84 0.71  GLUCOSE 188* 177* 168*  CALCIUM 8.7 8.7 8.5   Lab Results  Component Value Date   INR 1.02 03/05/2013   INR 1.12 11/08/2012   INR 1.15 11/07/2012    Examination:  General appearance: alert, cooperative and no distress Extremities:  extremities normal, atraumatic, no cyanosis or edema and Homans sign is negative, no sign of DVT  Wound Exam: clean, dry, intact   Drainage:  None: wound tissue dry  Motor Exam: EHL and FHL Intact  Sensory Exam: Deep Peroneal normal   Assessment:    3 Days Post-Op  Procedure(s) (LRB): TOTAL HIP ARTHROPLASTY (Right)  ADDITIONAL DIAGNOSIS:  Active Problems:   Primary localized osteoarthrosis, pelvic region and thigh  Acute Blood Loss Anemia   Plan: Physical Therapy as ordered Weight Bearing as Tolerated (WBAT)   DISCHARGE PLAN: Skilled Nursing Facility/Rehab vs Inpatient rehab           Kahi Mohala 03/17/2013, 8:53 AM

## 2013-03-18 LAB — GLUCOSE, CAPILLARY
Glucose-Capillary: 150 mg/dL — ABNORMAL HIGH (ref 70–99)
Glucose-Capillary: 175 mg/dL — ABNORMAL HIGH (ref 70–99)
Glucose-Capillary: 204 mg/dL — ABNORMAL HIGH (ref 70–99)
Glucose-Capillary: 168 mg/dL — ABNORMAL HIGH (ref 70–99)

## 2013-03-18 NOTE — Progress Notes (Signed)
Physical Therapy Treatment Patient Details Name: Lawrence Jordan MRN: [id number] DOB: 1950-08-29 Today's Date: 03/18/2013 Time: [id number] PT Time Calculation (min): 25 min  PT Assessment / Plan / Recommendation  History of Present Illness     PT Comments   Good progress today.  Pt very motivated to participate in PT.  Pt able to perform supine to sit Mod I without use of leg lifter.  Follow Up Recommendations  CIR     Does the patient have the potential to tolerate intense rehabilitation     Barriers to Discharge        Equipment Recommendations  Hospital bed    Recommendations for Other Services Rehab consult  Frequency 7X/week   Progress towards PT Goals Progress towards PT goals: Progressing toward goals  Plan Current plan remains appropriate    Precautions / Restrictions Precautions Precautions: Posterior Hip Precaution Comments: Reviewed 3/3 hip precautions Restrictions RLE Weight Bearing: Weight bearing as tolerated   Pertinent Vitals/Pain 3/10    Mobility  Bed Mobility Supine to sit: Modified independent (Device/Increase time) General bed mobility comments: use of bedrails, without use of leg lifter Transfers Equipment used: Rolling walker (2 wheeled) Sit to Stand: Min guard General transfer comment: verbal cues for hand placement Ambulation/Gait Ambulation/Gait assistance: Min guard Ambulation Distance (Feet): 120 Feet Assistive device: Rolling walker (2 wheeled) Gait Pattern/deviations: Step-through pattern;Decreased stride length Gait velocity: decreased    Exercises Total Joint Exercises Ankle Circles/Pumps: AROM;Both;10 reps Quad Sets: AROM;Both;10 reps Gluteal Sets: AROM;Both;10 reps   PT Diagnosis:    PT Problem List:   PT Treatment Interventions:     PT Goals (current goals can now be found in the care plan section)    Visit Information  Last PT Received On: 03/18/13 Assistance Needed: +1    Subjective Data      Cognition  Cognition Arousal/Alertness: Awake/alert Behavior During Therapy: [facility] for tasks assessed/performed Overall Cognitive Status: Within Functional Limits for tasks assessed    Balance     End of Session PT - End of Session Equipment Utilized During Treatment: Gait belt Activity Tolerance: Patient tolerated treatment well Patient left: in chair;with call bell/phone within reach Nurse Communication: Mobility status   GP     Lorriane Shire 03/18/2013, 3:00 PM  Lorrin Goodell, PT  Office # [phone] Pager [phone]

## 2013-03-18 NOTE — Progress Notes (Signed)
Clinical Social Work Department BRIEF PSYCHOSOCIAL ASSESSMENT 03/18/2013  Patient:  Lawrence Jordan, Lawrence Jordan     Account Number:  [id number]     Admit date:  03/14/2013  Clinical Social Worker:  Rolinda Roan  Date/Time:  03/18/2013 09:40 AM  Referred by:  Physician  Date Referred:  03/16/2013 Referred for  SNF Placement   Other Referral:   Interview type:  Patient Other interview type:    PSYCHOSOCIAL DATA Living Status:  WIFE Admitted from facility:   Level of care:   Primary support name:  Baldo Ash Primary support relationship to patient:  SPOUSE Degree of support available:   Good support.    CURRENT CONCERNS  Other Concerns:    SOCIAL WORK ASSESSMENT / PLAN Clinical Social Worker (CSW) met with patient to discuss D/C plan. Patient reported that he is agreeable to SNF search in [facility] as a back up plan to CIR. Patient reported that [facility] was his preference. Patient stated that he lives in [place] with his wife and is expecting to hear about from [facility] on Monday.   Assessment/plan status:  Psychosocial Support/Ongoing Assessment of Needs Other assessment/ plan:   Information/referral to community resources:    PATIENT'S/FAMILY'S RESPONSE TO PLAN OF CARE: Patient thanked CSW for visit and making a back up plan. Patient's first choice is CIR.

## 2013-03-18 NOTE — Progress Notes (Signed)
Clinical Social Work Department CLINICAL SOCIAL WORK PLACEMENT NOTE 03/18/2013  Patient:  Lawrence Jordan, Lawrence Jordan  Account Number:  [id number] Admit date:  03/14/2013  Clinical Social Worker:  Blima Rich, Latanya Presser  Date/time:  03/18/2013 09:47 AM  Clinical Social Work is seeking post-discharge placement for this patient at the following level of care:   [place]   (*CSW will update this form in Epic as items are completed)   03/18/2013  Patient/family provided with [facility] Department of Clinical Social Work's list of facilities offering this level of care within the geographic area requested by the patient (or if unable, by the patient's family).  03/18/2013  Patient/family informed of their freedom to choose among providers that offer the needed level of care, that participate in Medicare, Medicaid or managed care program needed by the patient, have an available bed and are willing to accept the patient.  03/18/2013  Patient/family informed of [facility]' ownership interest in [facility], as well as of the fact that they are under no obligation to receive care at this facility.  PASARR submitted to EDS on 03/16/2013 PASARR number received from EDS on 03/16/2013  FL2 transmitted to all facilities in geographic area requested by pt/family on  03/16/2013 FL2 transmitted to all facilities within larger geographic area on   Patient informed that his/her managed care company has contracts with or will negotiate with  certain facilities, including the following:     Patient/family informed of bed offers received:   Patient chooses bed at  Physician recommends and patient chooses bed at    Patient to be transferred to  on   Patient to be transferred to facility by   The following physician request were entered in Epic:   Additional Comments:

## 2013-03-18 NOTE — Progress Notes (Signed)
SPORTS MEDICINE AND JOINT REPLACEMENT  Lara Mulch, MD   Carlynn Spry, PA-C [address], [place], [place]  [address]                             [phone]   PROGRESS NOTE  Subjective:  negative for Chest Pain  negative for Shortness of Breath  negative for Nausea/Vomiting   negative for Calf Pain  negative for Bowel Movement   Tolerating Diet: yes         Patient reports pain as 5 on 0-10 scale.    Objective: Vital signs in last 24 hours:   Patient Vitals for the past 24 hrs:  BP Temp Temp src Pulse Resp SpO2  03/18/13 0601 127/96 mmHg 97.6 F (36.4 C) Oral 103 18 100 %  03/18/13 0332 - - - - 18 95 %  03/17/13 2320 - - - - 20 95 %  03/17/13 2144 - 98.1 F (36.7 C) Oral 94 20 95 %  03/17/13 2112 95/60 mmHg - - - - -  03/17/13 2000 - - - - 20 95 %  03/17/13 1333 105/76 mmHg 98.1 F (36.7 C) - 79 18 94 %    @flow {1959:LAST@   Intake/Output from previous day:   02/21 0701 - 02/22 0700 In: 1020 [P.O.:1020] Out: 1325 [Urine:1325]   Intake/Output this shift:   02/22 0701 - 02/22 1900 In: 360 [P.O.:360] Out: -    Intake/Output     02/21 0701 - 02/22 0700 02/22 0701 - 02/23 0700   P.O. 1020 360   Total Intake(mL/kg) 1020 (7.3) 360 (2.6)   Urine (mL/kg/hr) 1325 (0.4)    Total Output 1325     Net -305 +360        Urine Occurrence 3 x       LABORATORY DATA:  Recent Labs  03/15/13 0420 03/16/13 0414 03/17/13 0430  WBC 10.2 10.2 9.7  HGB 12.2* 10.8* 10.8*  HCT 35.9* 31.4* 31.3*  PLT 189 147* 157    Recent Labs  03/15/13 0420 03/16/13 0414 03/17/13 0430  NA 137 132* 131*  K 4.3 3.6* 3.4*  CL 96 91* 90*  CO2 26 26 25   BUN 12 14 15   CREATININE 0.78 0.84 0.71  GLUCOSE 188* 177* 168*  CALCIUM 8.7 8.7 8.5   Lab Results  Component Value Date   INR 1.02 03/05/2013   INR 1.12 11/08/2012   INR 1.15 11/07/2012    Examination:  General appearance: alert, cooperative and no distress Extremities: Homans sign is negative, no sign of  DVT  Wound Exam: clean, dry, intact   Drainage:  None: wound tissue dry  Motor Exam: EHL and FHL Intact  Sensory Exam: Deep Peroneal normal   Assessment:    4 Days Post-Op  Procedure(s) (LRB): TOTAL HIP ARTHROPLASTY (Right)  ADDITIONAL DIAGNOSIS:  Active Problems:   Primary localized osteoarthrosis, pelvic region and thigh  Acute Blood Loss Anemia   Plan: Physical Therapy as ordered Weight Bearing as Tolerated (WBAT)    DISCHARGE PLAN: Skilled Nursing Facility/Rehab vs inpatient rehab          [facility] 03/18/2013, 8:09 AM

## 2013-03-19 LAB — GLUCOSE, CAPILLARY
Glucose-Capillary: 165 mg/dL — ABNORMAL HIGH (ref 70–99)
Glucose-Capillary: 215 mg/dL — ABNORMAL HIGH (ref 70–99)

## 2013-03-19 NOTE — Progress Notes (Signed)
Discussed with pt, P.T. And RNCM. Patient has progressed to a level to be appropriate for home health. Patient is in agreement. We will sign off. [phone]

## 2013-03-19 NOTE — Progress Notes (Signed)
Physical Therapy Treatment Patient Details Name: Lawrence Jordan MRN: [id number] DOB: [birth year] Today's Date: 03/19/2013 Time: [id number] PT Time Calculation (min): 24 min  PT Assessment / Plan / Recommendation  History of Present Illness Pt. had R posterior THA.  Pt. with recent h/o afib/ flutter but currently not on tele monitor   PT Comments   Pt showing improvement towards physical therapy goals. Was able to demonstrate bed mobility with modified independence, and transfers/ambulation with min guard progressing to supervision. Spoke with pt regarding HHPT, as pt is progressing well. States he feels comfortable with d/c home "if that's what we decide" for him. No steps to enter home, however lives in a multi-level home and would continue to need a hospital bed to stay on main level until could negotiate the steps up to his bedroom.   Follow Up Recommendations  Home health PT     Does the patient have the potential to tolerate intense rehabilitation     Barriers to Discharge        Equipment Recommendations  Hospital bed    Recommendations for Other Services    Frequency 7X/week   Progress towards PT Goals Progress towards PT goals: Progressing toward goals  Plan Discharge plan needs to be updated    Precautions / Restrictions Precautions Precautions: Fall;Posterior Hip Precaution Comments: Reviewed 3/3 hip precautions Restrictions Weight Bearing Restrictions: Yes RLE Weight Bearing: Weight bearing as tolerated   Pertinent Vitals/Pain 5/10 pain at rest. Notified RN during session of need for pain meds.     Mobility  Bed Mobility Overal bed mobility: Needs Assistance Bed Mobility: Sit to Supine Sit to supine: Modified independent (Device/Increase time) General bed mobility comments: Pt able to transition sit>supine with minimal use of bed rails and no leg lifter to elevate RLE into bed. Pt was then able to independently scoot himself up towards [facility] for positioning.   Transfers Overall transfer level: Needs assistance Equipment used: Rolling walker (2 wheeled) Transfers: Sit to/from Stand Sit to Stand: Supervision General transfer comment: verbal cues for hand placement on seated surface for safety.  Ambulation/Gait Ambulation/Gait assistance: Min guard;Supervision Ambulation Distance (Feet): 125 Feet Assistive device: Rolling walker (2 wheeled) Gait Pattern/deviations: Step-to pattern;Step-through pattern;Decreased stride length Gait velocity: decreased Gait velocity interpretation: Below normal speed for age/gender General Gait Details: VC's for sequencing and safety awareness. Specific cueing for fluidity of walker movement and step-through gait pattern. Progressed well to supervision level. Pt reports heel pain on R which began after surgery. States it feels like plantar fasciitis pain. `    Exercises Total Joint Exercises Ankle Circles/Pumps: 10 reps Quad Sets: 10 reps Towel Squeeze: 10 reps Heel Slides: 10 reps Hip ABduction/ADduction: 10 reps Long Arc Quad: 10 reps   PT Diagnosis:    PT Problem List:   PT Treatment Interventions:     PT Goals (current goals can now be found in the care plan section) Acute Rehab PT Goals Patient Stated Goal: Decreased pain and increased function PT Goal Formulation: With patient Time For Goal Achievement: 03/22/13 Potential to Achieve Goals: Good  Visit Information  Last PT Received On: 03/19/13 Assistance Needed: +1 History of Present Illness: Pt. had R posterior THA.  Pt. with recent h/o afib/ flutter but currently not on tele monitor    Subjective Data  Subjective: Pt agreeable to therapy; wants therapist's opinion on how he is progressing for CIR vs. Home Patient Stated Goal: Decreased pain and increased function   Cognition  Cognition Arousal/Alertness: Awake/alert Behavior  During Therapy: WFL for tasks assessed/performed Overall Cognitive Status: Within Functional Limits for tasks  assessed    Balance  Balance Overall balance assessment: Needs assistance Sitting-balance support: Feet supported;Bilateral upper extremity supported Sitting balance-Leahy Scale: Fair Postural control: Posterior lean Standing balance support: Bilateral upper extremity supported Standing balance-Leahy Scale: Fair  End of Session PT - End of Session Equipment Utilized During Treatment: Gait belt Activity Tolerance: Patient tolerated treatment well Patient left: in bed;with call bell/phone within reach Nurse Communication: Mobility status   GP     Jolyn Lent 03/19/2013, 1:05 PM  Jolyn Lent, [place], DPT [phone]

## 2013-03-19 NOTE — Progress Notes (Signed)
OT Cancellation Note and Discharge  Patient Details Name: Lawrence Jordan MRN: [id number] DOB: [birth year]   Cancelled Treatment:    Reason Eval/Treat Not Completed: Other (comment) In to speak with pt about further OT needs, pt reports that he does not foresee any problems with ADLs at this point as he is moving better and will have close to 24/7 supervision at home. Acute OT will sign off.   [name] [id number] 03/19/2013, 1:45 PM

## 2013-03-19 NOTE — Progress Notes (Signed)
Subjective: 5 Days Post-Op Procedure(s) (LRB): TOTAL HIP ARTHROPLASTY (Right) Patient reports pain as 2 on 0-10 scale.  Doing well with pain control.  Progressing with PT.  No nausea/vomiting.  Some lightheadedness/dizzines this past Saturday.  Doing better with that today.   Objective: Vital signs in last 24 hours: Temp:  [97.3 F (36.3 C)-97.7 F (36.5 C)] 97.3 F (36.3 C) (02/23 0538) Pulse Rate:  [62-73] 62 (02/23 0538) Resp:  [16-20] 20 (02/23 0538) BP: (103-149)/(60-82) 119/60 mmHg (02/23 0538) SpO2:  [96 %-100 %] 96 % (02/23 0538)  Intake/Output from previous day: 02/22 0701 - 02/23 0700 In: 1080 [P.O.:1080] Out: -  Intake/Output this shift:     Recent Labs  03/17/13 0430  HGB 10.8*    Recent Labs  03/17/13 0430  WBC 9.7  RBC 3.34*  HCT 31.3*  PLT 157    Recent Labs  03/17/13 0430  NA 131*  K 3.4*  CL 90*  CO2 25  BUN 15  CREATININE 0.71  GLUCOSE 168*  CALCIUM 8.5   No results found for this basename: LABPT, INR,  in the last 72 hours  Neurologically intact ABD soft Neurovascular intact Sensation intact distally Intact pulses distally Dorsiflexion/Plantar flexion intact Compartment soft Negative Homan's  Assessment/Plan: 5 Days Post-Op Procedure(s) (LRB): TOTAL HIP ARTHROPLASTY (Right) Advance diet Up with therapy Dry dressing change PRN Continue WBAT posterior hip precautions Awaiting approval for inpatient rehab.  If not approved, patient is now thinking he would like to go home with HHPT.    Larae Grooms 03/19/2013, 7:12 AM

## 2013-03-19 NOTE — Progress Notes (Signed)
I await insurance decision for possible admission to inpt rehab today vs home with [facility]. Pt is aware. [id number]

## 2013-03-19 NOTE — Care Management Note (Signed)
CARE MANAGEMENT NOTE 03/19/2013  Patient:  Lawrence Jordan, Lawrence Jordan   Account Number:  [id number]  Date Initiated:  03/15/2013  Documentation initiated by:  Ricki Miller  Subjective/Objective Assessment:   63 yr old male s/p right total hip arthroplasty.     Action/Plan:   Case manager spoke with patient concerning home health and DME needs. Patient preoperatively setup with [facility], no changes. Patient states he has to have a hospital bed.   Anticipated DC Date:  03/19/2013   Anticipated DC Plan:  [place]  CM consult      [facility] Choice  HOME HEALTH  DURABLE MEDICAL EQUIPMENT   Choice offered to / List presented to:  C-1 Patient   DME arranged  [place]      DME agency  [employer]     HH arranged  HH-2 PT      [place].   Status of service:  Completed, signed off Medicare Important Message given?   (If response is "NO", the following Medicare IM given date fields will be blank) Date Medicare IM given:   Date Additional Medicare IM given:    Discharge Disposition:  [place]  Per UR Regulation:    If discussed at Long Length of Stay Meetings, dates discussed:    Comments:  03/19/13 11:00am Ricki Miller, RN BSN Case Manager Patient is progressing well with PT, CIR reccommends home with home Health, patient is in agreement. Case Manager notified [facility] of change in discharge plan.

## 2013-04-09 ENCOUNTER — Ambulatory Visit (INDEPENDENT_AMBULATORY_CARE_PROVIDER_SITE_OTHER): Payer: 59 | Admitting: Nurse Practitioner

## 2013-04-09 ENCOUNTER — Encounter: Payer: Self-pay | Admitting: Nurse Practitioner

## 2013-04-09 VITALS — BP 120/80 | HR 72 | Ht 71.0 in | Wt 307.8 lb

## 2013-04-09 DIAGNOSIS — I10 Essential (primary) hypertension: Secondary | ICD-10-CM

## 2013-04-09 DIAGNOSIS — I4891 Unspecified atrial fibrillation: Secondary | ICD-10-CM

## 2013-04-09 DIAGNOSIS — R079 Chest pain, unspecified: Secondary | ICD-10-CM

## 2013-04-09 DIAGNOSIS — I259 Chronic ischemic heart disease, unspecified: Secondary | ICD-10-CM

## 2013-04-09 LAB — CBC WITH DIFFERENTIAL/PLATELET
Basophils Absolute: 0 10*3/uL (ref 0.0–0.1)
Basophils Relative: 0.5 % (ref 0.0–3.0)
Eosinophils Absolute: 0.1 10*3/uL (ref 0.0–0.7)
Eosinophils Relative: 1.7 % (ref 0.0–5.0)
HCT: 36.1 % — ABNORMAL LOW (ref 39.0–52.0)
Hemoglobin: 12.2 g/dL — ABNORMAL LOW (ref 13.0–17.0)
Lymphocytes Relative: 29.1 % (ref 12.0–46.0)
Lymphs Abs: 1.6 10*3/uL (ref 0.7–4.0)
MCHC: 33.7 g/dL (ref 30.0–36.0)
MCV: 95.5 fl (ref 78.0–100.0)
Monocytes Absolute: 0.5 10*3/uL (ref 0.1–1.0)
Monocytes Relative: 8.8 % (ref 3.0–12.0)
Neutro Abs: 3.2 10*3/uL (ref 1.4–7.7)
Neutrophils Relative %: 59.9 % (ref 43.0–77.0)
Platelets: 221 10*3/uL (ref 150.0–400.0)
RBC: 3.79 Mil/uL — ABNORMAL LOW (ref 4.22–5.81)
RDW: 14.1 % (ref 11.5–14.6)
WBC: 5.4 10*3/uL (ref 4.5–10.5)

## 2013-04-09 LAB — HEPATIC FUNCTION PANEL
ALT: 22 U/L (ref 0–53)
AST: 19 U/L (ref 0–37)
Albumin: 4.1 g/dL (ref 3.5–5.2)
Alkaline Phosphatase: 62 U/L (ref 39–117)
Bilirubin, Direct: 0 mg/dL (ref 0.0–0.3)
Total Bilirubin: 0.5 mg/dL (ref 0.3–1.2)
Total Protein: 7.2 g/dL (ref 6.0–8.3)

## 2013-04-09 LAB — TSH: TSH: 2 u[IU]/mL (ref 0.35–5.50)

## 2013-04-09 LAB — BASIC METABOLIC PANEL
BUN: 13 mg/dL (ref 6–23)
CO2: 29 mEq/L (ref 19–32)
Calcium: 9.4 mg/dL (ref 8.4–10.5)
Chloride: 100 mEq/L (ref 96–112)
Creatinine, Ser: 0.8 mg/dL (ref 0.4–1.5)
GFR: 110.34 mL/min (ref >60.00)
Glucose, Bld: 146 mg/dL — ABNORMAL HIGH (ref 70–99)
Potassium: 3.5 mEq/L (ref 3.5–5.1)
Sodium: 137 mEq/L (ref 135–145)
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
## 2013-04-09 NOTE — Patient Instructions (Signed)
Your EKG is ok.  We need to check labs today  We need to get a stress test (lexiscan)  See Dr. [name] for follow up and discussion of AF ablation (after stress test)  For now, stay on your current medicines  Call the [place] office at [phone] if you have any questions, problems or concerns.

## 2013-04-18 ENCOUNTER — Encounter: Payer: Self-pay | Admitting: Cardiology

## 2013-04-18 ENCOUNTER — Ambulatory Visit (HOSPITAL_COMMUNITY): Payer: 59 | Attending: Cardiology | Admitting: Radiology

## 2013-04-18 VITALS — BP 102/70 | HR 61 | Ht 71.0 in | Wt 304.0 lb

## 2013-04-18 DIAGNOSIS — R079 Chest pain, unspecified: Secondary | ICD-10-CM | POA: Insufficient documentation

## 2013-04-18 DIAGNOSIS — I10 Essential (primary) hypertension: Secondary | ICD-10-CM | POA: Insufficient documentation

## 2013-04-18 DIAGNOSIS — I4891 Unspecified atrial fibrillation: Secondary | ICD-10-CM | POA: Insufficient documentation

## 2013-04-18 DIAGNOSIS — R0602 Shortness of breath: Secondary | ICD-10-CM

## 2013-04-18 DIAGNOSIS — I259 Chronic ischemic heart disease, unspecified: Secondary | ICD-10-CM | POA: Insufficient documentation

## 2013-04-18 DIAGNOSIS — I2510 Atherosclerotic heart disease of native coronary artery without angina pectoris: Secondary | ICD-10-CM

## 2013-04-18 MED ORDER — TECHNETIUM TC 99M SESTAMIBI GENERIC - CARDIOLITE
33.0000 | Freq: Once | INTRAVENOUS | Status: AC | PRN
  Administered 2013-04-18: 33 via INTRAVENOUS

## 2013-04-18 MED ORDER — REGADENOSON 0.4 MG/5ML IV SOLN
0.4000 mg | Freq: Once | INTRAVENOUS | Status: AC
  Administered 2013-04-18: 0.4 mg via INTRAVENOUS

## 2013-04-18 NOTE — Progress Notes (Signed)
[facility] [address] [place], [place] [address] [id number]    Cardiology Nuclear Med Study  Lawrence Jordan is a 63 y.o. male     MRN : [id number]     DOB: 1950-04-12  Procedure Date: 04/18/2013  Nuclear Med Background Indication for Stress Test:  Evaluation for Ischemia History: Cath: 30-40% LM, PAF, Cardioversion, 2012 Myocardial Perfusion Imaging-Normal, EF=58%, and 2014 Echo: EF=50-55% Cardiac Risk Factors: Family History - CAD, Hypertension, Lipids and NIDDM  Symptoms: Chest Tightness with/without exertion (last occurrence 2 days ago), Dizziness, DOE, Palpitations and SOB   Nuclear Pre-Procedure Caffeine/Decaff Intake:  None > 12 hrs NPO After: 8:30am   Lungs:  clear O2 Sat: 97% on room air. IV 0.9% NS with Angio Cath:  22g  IV Site: R Hand , tolerated well IV Started by:  Irven Baltimore, RN  Chest Size (in):  52 Cup Size: n/a  Height: 5\' 11"  (1.803 m)  Weight:  304 lb (137.893 kg)  BMI:  Body mass index is 42.42 kg/(m^2). Tech Comments:  Took Metoprolol and Glucophage this am. Irven Baltimore, RN    Nuclear Med Study 1 or 2 day study: 2 day  Stress Test Type:  Carlton Adam  Reading MD: N/A  Order Authorizing Provider:  [name], MD, and Truitt Merle, NP  Resting Radionuclide: Technetium 16m Sestamibi  Resting Radionuclide Dose: 33.0 mCi on 04/19/13   Stress Radionuclide:  Technetium 65m Sestamibi  Stress Radionuclide Dose: 33.0 mCi on 04/18/13           Stress Protocol Rest HR: 61 Stress HR: 68  Rest BP: 102/70 Stress BP: 111/67  Exercise Time (min): n/a METS: n/a   Predicted Max HR: 158 bpm % Max HR: 43.04 bpm Rate Pressure Product: 7548   Dose of Adenosine (mg):  n/a Dose of Lexiscan: 0.4 mg  Dose of Atropine (mg): n/a Dose of Dobutamine: n/a mcg/kg/min (at max HR)  Stress Test Technologist: Irven Baltimore, RN  Nuclear Technologist:  Charlton Amor, CNMT     Rest Procedure:  Myocardial perfusion imaging was performed at  rest 45 minutes following the intravenous administration of Technetium 52m Sestamibi. Rest ECG: NSR - Normal EKG  Stress Procedure:  The patient received IV Lexiscan 0.4 mg over 15-seconds.  Technetium 31m Sestamibi injected at 30-seconds.  The patient complained of SOB, tingling in arms and neck, dizziness, headache, but denied chest pain.Quantitative spect images were obtained after a 45 minute delay. Stress ECG: No significant change from baseline ECG  QPS Raw Data Images:  Normal; no motion artifact; normal heart/lung ratio. Stress Images:  Normal homogeneous uptake in all areas of the myocardium. Rest Images:  Normal homogeneous uptake in all areas of the myocardium. Subtraction (SDS):  No evidence of ischemia. Transient Ischemic Dilatation (Normal <1.22):  1.05 Lung/Heart Ratio (Normal <0.45):  0.33  Quantitative Gated Spect Images QGS EDV:  168 ml QGS ESV:  70 ml  Impression Exercise Capacity:  Lexiscan with no exercise. BP Response:  Hypotensive blood pressure response. Clinical Symptoms:  No chest pain. ECG Impression:  No significant ST segment change suggestive of ischemia. Comparison with Prior Nuclear Study: No images to compare  Overall Impression:  Normal stress nuclear study.  LV Ejection Fraction: 58%.  LV Wall Motion:  NL LV Function; NL Wall Motion  [employer]

## 2013-04-19 ENCOUNTER — Ambulatory Visit (HOSPITAL_COMMUNITY): Payer: 59 | Attending: Internal Medicine

## 2013-04-19 DIAGNOSIS — R0989 Other specified symptoms and signs involving the circulatory and respiratory systems: Secondary | ICD-10-CM

## 2013-04-19 MED ORDER — TECHNETIUM TC 99M SESTAMIBI GENERIC - CARDIOLITE
30.0000 | Freq: Once | INTRAVENOUS | Status: AC | PRN
  Administered 2013-04-19: 30 via INTRAVENOUS

## 2013-05-23 ENCOUNTER — Ambulatory Visit (INDEPENDENT_AMBULATORY_CARE_PROVIDER_SITE_OTHER): Payer: 59 | Admitting: Internal Medicine

## 2013-05-23 ENCOUNTER — Encounter: Payer: Self-pay | Admitting: Internal Medicine

## 2013-05-23 VITALS — BP 137/94 | HR 58 | Ht 71.0 in | Wt 320.0 lb

## 2013-05-23 DIAGNOSIS — R609 Edema, unspecified: Secondary | ICD-10-CM

## 2013-05-23 DIAGNOSIS — I10 Essential (primary) hypertension: Secondary | ICD-10-CM

## 2013-05-23 DIAGNOSIS — I4891 Unspecified atrial fibrillation: Secondary | ICD-10-CM

## 2013-05-23 DIAGNOSIS — R0989 Other specified symptoms and signs involving the circulatory and respiratory systems: Secondary | ICD-10-CM

## 2013-05-23 DIAGNOSIS — R0609 Other forms of dyspnea: Secondary | ICD-10-CM

## 2013-05-23 DIAGNOSIS — R0683 Snoring: Secondary | ICD-10-CM
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
## 2013-05-23 NOTE — Patient Instructions (Signed)
Your physician has recommended that you have a sleep study. This test records several body functions during sleep, including: brain activity, eye movement, oxygen and carbon dioxide blood levels, heart rate and rhythm, breathing rate and rhythm, the flow of air through your mouth and nose, snoring, body muscle movements, and chest and belly movement.   Your physician recommends that you schedule a follow-up appointment in: 3 months with Dr [name].  Your physician has recommended you make the following change in your medication: if you have atrial fibrillation, you can take an extra amiodarone, if still in afib, ok to take extra metoprolol

## 2013-07-04 ENCOUNTER — Telehealth: Payer: Self-pay | Admitting: Internal Medicine

## 2013-07-04 NOTE — Telephone Encounter (Signed)
New message     Patient calling C/O feet are swollen . Some in finger. A little sob nothing usually . No chest pain.

## 2013-07-05 NOTE — Telephone Encounter (Signed)
Left message for patient to return my call.

## 2013-07-10 NOTE — Telephone Encounter (Signed)
Left another message for patient and let him know if he is still having problems

## 2013-09-26 ENCOUNTER — Encounter: Payer: Self-pay | Admitting: Internal Medicine

## 2013-10-03 ENCOUNTER — Encounter (HOSPITAL_COMMUNITY): Payer: Self-pay | Admitting: Pharmacy Technician

## 2013-10-04 ENCOUNTER — Telehealth: Payer: Self-pay | Admitting: Internal Medicine

## 2013-10-04 NOTE — Telephone Encounter (Signed)
New problem:    Pt would like a call back to clarify his up coming appointments / procedures.

## 2013-10-05 ENCOUNTER — Other Ambulatory Visit: Payer: Self-pay

## 2013-10-05 DIAGNOSIS — I4819 Other persistent atrial fibrillation: Secondary | ICD-10-CM

## 2013-10-05 NOTE — Telephone Encounter (Signed)
All orders in and letter out front

## 2013-10-11 ENCOUNTER — Other Ambulatory Visit (INDEPENDENT_AMBULATORY_CARE_PROVIDER_SITE_OTHER): Payer: 59

## 2013-10-11 ENCOUNTER — Other Ambulatory Visit: Payer: Self-pay | Admitting: *Deleted

## 2013-10-11 DIAGNOSIS — I4891 Unspecified atrial fibrillation: Secondary | ICD-10-CM

## 2013-10-11 DIAGNOSIS — E876 Hypokalemia: Secondary | ICD-10-CM

## 2013-10-11 DIAGNOSIS — I4819 Other persistent atrial fibrillation: Secondary | ICD-10-CM

## 2013-10-11 LAB — CBC WITH DIFFERENTIAL/PLATELET
Basophils Absolute: 0 10*3/uL (ref 0.0–0.1)
Basophils Relative: 0.5 % (ref 0.0–3.0)
Eosinophils Absolute: 0.1 10*3/uL (ref 0.0–0.7)
Eosinophils Relative: 1.4 % (ref 0.0–5.0)
HCT: 42.5 % (ref 39.0–52.0)
Hemoglobin: 14.6 g/dL (ref 13.0–17.0)
Lymphocytes Relative: 34 % (ref 12.0–46.0)
Lymphs Abs: 2.3 10*3/uL (ref 0.7–4.0)
MCHC: 34.3 g/dL (ref 30.0–36.0)
MCV: 95 fl (ref 78.0–100.0)
Monocytes Absolute: 0.5 10*3/uL (ref 0.1–1.0)
Monocytes Relative: 7.7 % (ref 3.0–12.0)
Neutro Abs: 3.8 10*3/uL (ref 1.4–7.7)
Neutrophils Relative %: 56.4 % (ref 43.0–77.0)
Platelets: 201 10*3/uL (ref 150.0–400.0)
RBC: 4.47 Mil/uL (ref 4.22–5.81)
RDW: 13.9 % (ref 11.5–15.5)
WBC: 6.8 10*3/uL (ref 4.0–10.5)

## 2013-10-11 LAB — BASIC METABOLIC PANEL
BUN: 20 mg/dL (ref 6–23)
CO2: 25 mEq/L (ref 19–32)
Calcium: 9.1 mg/dL (ref 8.4–10.5)
Chloride: 94 mEq/L — ABNORMAL LOW (ref 96–112)
Creatinine, Ser: 1 mg/dL (ref 0.4–1.5)
GFR: 81.19 mL/min (ref >60.00)
Glucose, Bld: 295 mg/dL — ABNORMAL HIGH (ref 70–99)
Potassium: 3.1 mEq/L — ABNORMAL LOW (ref 3.5–5.1)
Sodium: 133 mEq/L — ABNORMAL LOW (ref 135–145)

## 2013-10-11 MED ORDER — POTASSIUM CHLORIDE CRYS ER 20 MEQ PO TBCR: 20.0000 meq | EXTENDED_RELEASE_TABLET | Freq: Two times a day (BID) | ORAL | Status: DC

## 2013-10-16 ENCOUNTER — Telehealth: Payer: Self-pay | Admitting: Internal Medicine

## 2013-10-16 ENCOUNTER — Other Ambulatory Visit: Payer: 59

## 2013-10-17 ENCOUNTER — Other Ambulatory Visit: Payer: 59

## 2013-10-17 ENCOUNTER — Ambulatory Visit (HOSPITAL_COMMUNITY)
Admission: RE | Admit: 2013-10-17 | Discharge: 2013-10-17 | Disposition: A | Discharge status: Home / Self Care | Payer: 59 | Source: Ambulatory Visit | Attending: Cardiology | Admitting: Cardiology

## 2013-10-17 ENCOUNTER — Encounter (HOSPITAL_COMMUNITY)
Admission: RE | Disposition: A | Discharge status: Home / Self Care | Payer: Self-pay | Source: Ambulatory Visit | Attending: Cardiology

## 2013-10-17 DIAGNOSIS — Z7901 Long term (current) use of anticoagulants: Secondary | ICD-10-CM | POA: Diagnosis not present

## 2013-10-17 DIAGNOSIS — I4819 Other persistent atrial fibrillation: Secondary | ICD-10-CM

## 2013-10-17 DIAGNOSIS — Z96649 Presence of unspecified artificial hip joint: Secondary | ICD-10-CM | POA: Diagnosis not present

## 2013-10-17 DIAGNOSIS — I059 Rheumatic mitral valve disease, unspecified: Secondary | ICD-10-CM

## 2013-10-17 DIAGNOSIS — Z96659 Presence of unspecified artificial knee joint: Secondary | ICD-10-CM | POA: Diagnosis not present

## 2013-10-17 DIAGNOSIS — E669 Obesity, unspecified: Secondary | ICD-10-CM | POA: Insufficient documentation

## 2013-10-17 DIAGNOSIS — I2510 Atherosclerotic heart disease of native coronary artery without angina pectoris: Secondary | ICD-10-CM | POA: Insufficient documentation

## 2013-10-17 DIAGNOSIS — I4891 Unspecified atrial fibrillation: Secondary | ICD-10-CM | POA: Diagnosis present

## 2013-10-17 DIAGNOSIS — E119 Type 2 diabetes mellitus without complications: Secondary | ICD-10-CM | POA: Insufficient documentation

## 2013-10-17 DIAGNOSIS — I4892 Unspecified atrial flutter: Secondary | ICD-10-CM

## 2013-10-17 DIAGNOSIS — E7800 Pure hypercholesterolemia, unspecified: Secondary | ICD-10-CM | POA: Insufficient documentation

## 2013-10-17 DIAGNOSIS — Z6841 Body Mass Index (BMI) 40.0 and over, adult: Secondary | ICD-10-CM | POA: Insufficient documentation

## 2013-10-17 DIAGNOSIS — I359 Nonrheumatic aortic valve disorder, unspecified: Secondary | ICD-10-CM

## 2013-10-17 DIAGNOSIS — I10 Essential (primary) hypertension: Secondary | ICD-10-CM | POA: Diagnosis not present

## 2013-10-17 HISTORY — PX: TEE WITHOUT CARDIOVERSION: SHX5443

## 2013-10-17 LAB — BASIC METABOLIC PANEL
Anion gap: 17 — ABNORMAL HIGH (ref 5–15)
BUN: 11 mg/dL (ref 6–23)
CO2: 26 mEq/L (ref 19–32)
Calcium: 9.4 mg/dL (ref 8.4–10.5)
Chloride: 94 mEq/L — ABNORMAL LOW (ref 96–112)
Creatinine, Ser: 0.68 mg/dL (ref 0.50–1.35)
GFR calc Af Amer: >90 mL/min (ref >90)
GFR calc non Af Amer: >90 mL/min (ref >90)
Glucose, Bld: 272 mg/dL — ABNORMAL HIGH (ref 70–99)
Potassium: 3.5 mEq/L — ABNORMAL LOW (ref 3.7–5.3)
Sodium: 137 mEq/L (ref 137–147)

## 2013-10-17 LAB — GLUCOSE, CAPILLARY: Glucose-Capillary: 304 mg/dL — ABNORMAL HIGH (ref 70–99)

## 2013-10-17 SURGERY — ECHOCARDIOGRAM, TRANSESOPHAGEAL
Anesthesia: Moderate Sedation

## 2013-10-17 MED ORDER — FENTANYL CITRATE 0.05 MG/ML IJ SOLN
INTRAMUSCULAR | Status: AC
  Filled 2013-10-17: qty 2

## 2013-10-17 MED ORDER — LIDOCAINE VISCOUS 2 % MT SOLN
OROMUCOSAL | Status: AC
  Filled 2013-10-17: qty 15

## 2013-10-17 MED ORDER — MIDAZOLAM HCL 5 MG/ML IJ SOLN
INTRAMUSCULAR | Status: AC
  Filled 2013-10-17: qty 2

## 2013-10-17 MED ORDER — SODIUM CHLORIDE 0.9 % IV SOLN
INTRAVENOUS | Status: DC
  Administered 2013-10-17: 500 mL via INTRAVENOUS

## 2013-10-17 MED ORDER — MIDAZOLAM HCL 10 MG/2ML IJ SOLN
INTRAMUSCULAR | Status: DC | PRN
  Administered 2013-10-17: 2 mg via INTRAVENOUS
  Administered 2013-10-17: 1 mg via INTRAVENOUS

## 2013-10-17 MED ORDER — BUTAMBEN-TETRACAINE-BENZOCAINE 2-2-14 % EX AERO
INHALATION_SPRAY | CUTANEOUS | Status: DC | PRN
  Administered 2013-10-17: 2 via TOPICAL

## 2013-10-17 MED ORDER — FENTANYL CITRATE 0.05 MG/ML IJ SOLN
INTRAMUSCULAR | Status: DC | PRN
  Administered 2013-10-17: 50 ug via INTRAVENOUS
  Administered 2013-10-17: 25 ug via INTRAVENOUS
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
## 2013-10-17 NOTE — Discharge Instructions (Signed)
Transesophageal Echocardiogram °Transesophageal echocardiography (TEE) is a special type of test that produces images of the heart by using sound waves (echocardiogram). This type of echocardiography can obtain better images of the heart than standard echocardiography. TEE is done by passing a flexible tube down the esophagus. The heart is located in front of the esophagus. Because the heart and esophagus are close to one another, your health care provider can take very clear, detailed pictures of the heart via ultrasound waves. °TEE may be done: °· If your health care provider needs more information based on standard echocardiography findings. °· If you had a stroke. This might have happened because a clot formed in your heart. TEE can visualize different areas of the heart and check for clots. °· To check valve anatomy and function. °· To check for infection on the inside of your heart (endocarditis). °· To evaluate the dividing wall (septum) of the heart and presence of a hole that did not close after birth (patent foramen ovale or atrial septal defect). °· To help diagnose a tear in the wall of the aorta (aortic dissection). °· During cardiac valve surgery. This allows the surgeon to assess the valve repair before closing the chest. °· During a variety of other cardiac procedures to guide positioning of catheters. °· Sometimes before a cardioversion, which is a shock to convert heart rhythm back to normal. °LET YOUR HEALTH CARE PROVIDER KNOW ABOUT:  °· Any allergies you have. °· All medicines you are taking, including vitamins, herbs, eye drops, creams, and over-the-counter medicines. °· Previous problems you or members of your family have had with the use of anesthetics. °· Any blood disorders you have. °· Previous surgeries you have had. °· Medical conditions you have. °· Swallowing difficulties. °· An esophageal obstruction. °RISKS AND COMPLICATIONS  °Generally, TEE is a safe procedure. However, as with any  procedure, complications can occur. Possible complications include an esophageal tear (rupture). °BEFORE THE PROCEDURE  °· Do not eat or drink for 6 hours before the procedure or as directed by your health care provider. °· Arrange for someone to drive you home after the procedure. Do not drive yourself home. During the procedure, you will be given medicines that can continue to make you feel drowsy and can impair your reflexes. °· An IV access tube will be started in the arm. °PROCEDURE  °· A medicine to help you relax (sedative) will be given through the IV access tube. °· A medicine may be sprayed or gargled to numb the back of the throat. °· Your blood pressure, heart rate, and breathing (vital signs) will be monitored during the procedure. °· The TEE probe is a long, flexible tube. The tip of the probe is placed into the back of the mouth, and you will be asked to swallow. This helps to pass the tip of the probe into the esophagus. Once the tip of the probe is in the correct area, your health care provider can take pictures of the heart. °· TEE is usually not a painful procedure. You may feel the probe press against the back of the throat. The probe does not enter the trachea and does not affect your breathing. °AFTER THE PROCEDURE  °· You will be in bed, resting, until you have fully returned to consciousness. °· When you first awaken, your throat may feel slightly sore and will probably still feel numb. This will improve slowly over time. °· You will not be allowed to eat or drink until it   is clear that the numbness has improved. °· Once you have been able to drink, urinate, and sit on the edge of the bed without feeling sick to your stomach (nausea) or dizzy, you may be cleared to go home. °· You should have a friend or family member with you for the next 24 hours after your procedure. °Document Released: 04/03/2002 Document Revised: 01/16/2013 Document Reviewed: 07/13/2012 °[employer]® Patient Information  ©2015 [employer]. This information is not intended to replace advice given to you by your health care provider. Make sure you discuss any questions you have with your health care provider. ° °

## 2013-10-17 NOTE — Progress Notes (Signed)
  Echocardiogram Echocardiogram Transesophageal has been performed.  Lawrence Jordan FRANCES 10/17/2013, 9:50 AM

## 2013-10-17 NOTE — CV Procedure (Signed)
TEE  Indications: Atrial fibrillation, paroxysmal  Findings: No left atrial appendage thrombus. Normal ejection fraction of 55%. Mild aortic insufficiency.  Proceed with ablation.  Candee Furbish, MD

## 2013-10-17 NOTE — Interval H&P Note (Signed)
History and Physical Interval Note:  10/17/2013 9:10 AM  Lawrence Jordan  has presented today for surgery, with the diagnosis of AFIB  The various methods of treatment have been discussed with the patient and family. After consideration of risks, benefits and other options for treatment, the patient has consented to  Procedure(s): TRANSESOPHAGEAL ECHOCARDIOGRAM (TEE) (N/A) as a surgical intervention .  The patient's history has been reviewed, patient examined, no change in status, stable for surgery.  I have reviewed the patient's chart and labs.  Questions were answered to the patient's satisfaction.     Rondrick Barreira

## 2013-10-18 ENCOUNTER — Encounter (HOSPITAL_COMMUNITY): Payer: Self-pay | Admitting: Cardiology

## 2013-10-18 ENCOUNTER — Ambulatory Visit (HOSPITAL_COMMUNITY)
Admission: RE | Admit: 2013-10-18 | Discharge: 2013-10-19 | Disposition: A | Discharge status: Home / Self Care | Payer: 59 | Source: Ambulatory Visit | Attending: Internal Medicine | Admitting: Internal Medicine

## 2013-10-18 ENCOUNTER — Ambulatory Visit (HOSPITAL_COMMUNITY): Payer: 59 | Admitting: Certified Registered"

## 2013-10-18 ENCOUNTER — Encounter (HOSPITAL_COMMUNITY): Payer: 59 | Admitting: Certified Registered"

## 2013-10-18 ENCOUNTER — Encounter (HOSPITAL_COMMUNITY)
Admission: RE | Disposition: A | Discharge status: Home / Self Care | Payer: Self-pay | Source: Ambulatory Visit | Attending: Internal Medicine

## 2013-10-18 DIAGNOSIS — I4819 Other persistent atrial fibrillation: Secondary | ICD-10-CM

## 2013-10-18 DIAGNOSIS — I10 Essential (primary) hypertension: Secondary | ICD-10-CM | POA: Insufficient documentation

## 2013-10-18 DIAGNOSIS — Z7901 Long term (current) use of anticoagulants: Secondary | ICD-10-CM | POA: Diagnosis not present

## 2013-10-18 DIAGNOSIS — I4891 Unspecified atrial fibrillation: Secondary | ICD-10-CM | POA: Insufficient documentation

## 2013-10-18 DIAGNOSIS — E119 Type 2 diabetes mellitus without complications: Secondary | ICD-10-CM | POA: Insufficient documentation

## 2013-10-18 DIAGNOSIS — I4892 Unspecified atrial flutter: Secondary | ICD-10-CM | POA: Insufficient documentation

## 2013-10-18 DIAGNOSIS — E7800 Pure hypercholesterolemia, unspecified: Secondary | ICD-10-CM | POA: Diagnosis not present

## 2013-10-18 DIAGNOSIS — Z96649 Presence of unspecified artificial hip joint: Secondary | ICD-10-CM | POA: Insufficient documentation

## 2013-10-18 DIAGNOSIS — Z79899 Other long term (current) drug therapy: Secondary | ICD-10-CM | POA: Insufficient documentation

## 2013-10-18 DIAGNOSIS — I2510 Atherosclerotic heart disease of native coronary artery without angina pectoris: Secondary | ICD-10-CM | POA: Diagnosis not present

## 2013-10-18 HISTORY — PX: ATRIAL FIBRILLATION ABLATION: SHX5456

## 2013-10-18 HISTORY — PX: ABLATION: SHX5711

## 2013-10-18 LAB — MRSA PCR SCREENING: MRSA by PCR: NEGATIVE

## 2013-10-18 LAB — GLUCOSE, CAPILLARY
Glucose-Capillary: 323 mg/dL — ABNORMAL HIGH (ref 70–99)
Glucose-Capillary: 389 mg/dL — ABNORMAL HIGH (ref 70–99)
Glucose-Capillary: 404 mg/dL — ABNORMAL HIGH (ref 70–99)
Glucose-Capillary: 336 mg/dL — ABNORMAL HIGH (ref 70–99)
Glucose-Capillary: 406 mg/dL — ABNORMAL HIGH (ref 70–99)

## 2013-10-18 LAB — POCT ACTIVATED CLOTTING TIME
Activated Clotting Time: 174 seconds
Activated Clotting Time: 197 seconds
Activated Clotting Time: 214 seconds
Activated Clotting Time: 124 seconds

## 2013-10-18 SURGERY — ATRIAL FIBRILLATION ABLATION
Anesthesia: General

## 2013-10-18 MED ORDER — INSULIN ASPART 100 UNIT/ML ~~LOC~~ SOLN
SUBCUTANEOUS | Status: AC
  Filled 2013-10-18: qty 1

## 2013-10-18 MED ORDER — FENTANYL CITRATE 0.05 MG/ML IJ SOLN
INTRAMUSCULAR | Status: DC | PRN
  Administered 2013-10-18 (×4): 50 ug via INTRAVENOUS

## 2013-10-18 MED ORDER — METOPROLOL TARTRATE 50 MG PO TABS
50.0000 mg | ORAL_TABLET | Freq: Two times a day (BID) | ORAL | Status: DC
  Administered 2013-10-18 – 2013-10-19 (×2): 50 mg via ORAL
  Filled 2013-10-18 (×4): qty 1

## 2013-10-18 MED ORDER — DIPHENHYDRAMINE HCL 50 MG/ML IJ SOLN
INTRAMUSCULAR | Status: AC
  Filled 2013-10-18: qty 1

## 2013-10-18 MED ORDER — HEPARIN SODIUM (PORCINE) 1000 UNIT/ML IJ SOLN
INTRAMUSCULAR | Status: DC | PRN
  Administered 2013-10-18 (×2): 5000 [IU] via INTRAVENOUS
  Administered 2013-10-18: 12000 [IU] via INTRAVENOUS

## 2013-10-18 MED ORDER — ONDANSETRON HCL 4 MG/2ML IJ SOLN: 4.0000 mg | Freq: Four times a day (QID) | INTRAMUSCULAR | Status: DC | PRN

## 2013-10-18 MED ORDER — SODIUM CHLORIDE 0.9 % IJ SOLN
3.0000 mL | Freq: Two times a day (BID) | INTRAMUSCULAR | Status: DC
  Administered 2013-10-18 (×2): 3 mL via INTRAVENOUS

## 2013-10-18 MED ORDER — ROCURONIUM BROMIDE 100 MG/10ML IV SOLN
INTRAVENOUS | Status: DC | PRN
  Administered 2013-10-18: 10 mg via INTRAVENOUS
  Administered 2013-10-18: 50 mg via INTRAVENOUS
  Administered 2013-10-18 (×3): 20 mg via INTRAVENOUS

## 2013-10-18 MED ORDER — METHYLPREDNISOLONE SODIUM SUCC 125 MG IJ SOLR
INTRAMUSCULAR | Status: AC
  Filled 2013-10-18: qty 2

## 2013-10-18 MED ORDER — INSULIN ASPART 100 UNIT/ML ~~LOC~~ SOLN
4.0000 [IU] | Freq: Once | SUBCUTANEOUS | Status: AC
  Administered 2013-10-18: 4 [IU] via SUBCUTANEOUS

## 2013-10-18 MED ORDER — ONDANSETRON HCL 4 MG/2ML IJ SOLN
INTRAMUSCULAR | Status: DC | PRN
  Administered 2013-10-18: 4 mg via INTRAVENOUS

## 2013-10-18 MED ORDER — DIPHENHYDRAMINE HCL 50 MG/ML IJ SOLN
25.0000 mg | INTRAMUSCULAR | Status: AC
  Administered 2013-10-18: 25 mg via INTRAVENOUS

## 2013-10-18 MED ORDER — SODIUM CHLORIDE 0.9 % IJ SOLN: 3.0000 mL | INTRAMUSCULAR | Status: DC | PRN

## 2013-10-18 MED ORDER — FAMOTIDINE IN NACL 20-0.9 MG/50ML-% IV SOLN
INTRAVENOUS | Status: AC
  Filled 2013-10-18: qty 50

## 2013-10-18 MED ORDER — METHYLPREDNISOLONE SODIUM SUCC 125 MG IJ SOLR
125.0000 mg | INTRAMUSCULAR | Status: AC
  Administered 2013-10-18: 125 mg via INTRAVENOUS

## 2013-10-18 MED ORDER — INSULIN ASPART 100 UNIT/ML ~~LOC~~ SOLN
10.0000 [IU] | Freq: Once | SUBCUTANEOUS | Status: AC
  Administered 2013-10-18: 10 [IU] via SUBCUTANEOUS

## 2013-10-18 MED ORDER — PROPOFOL 10 MG/ML IV BOLUS
INTRAVENOUS | Status: DC | PRN
  Administered 2013-10-18: 170 mg via INTRAVENOUS

## 2013-10-18 MED ORDER — ACETAMINOPHEN 325 MG PO TABS: 650.0000 mg | ORAL_TABLET | ORAL | Status: DC | PRN

## 2013-10-18 MED ORDER — ARTIFICIAL TEARS OP OINT
TOPICAL_OINTMENT | OPHTHALMIC | Status: DC | PRN
  Administered 2013-10-18: 1 via OPHTHALMIC

## 2013-10-18 MED ORDER — HEPARIN SODIUM (PORCINE) 1000 UNIT/ML IJ SOLN
INTRAMUSCULAR | Status: AC
  Filled 2013-10-18: qty 1

## 2013-10-18 MED ORDER — PROTAMINE SULFATE 10 MG/ML IV SOLN
INTRAVENOUS | Status: DC | PRN
Start: 2013-10-18 — End: 2013-10-18
  Administered 2013-10-18: 15 mg via INTRAVENOUS
  Administered 2013-10-18: 5 mg via INTRAVENOUS

## 2013-10-18 MED ORDER — FAMOTIDINE IN NACL 20-0.9 MG/50ML-% IV SOLN
20.0000 mg | INTRAVENOUS | Status: AC
  Administered 2013-10-18: 20 mg via INTRAVENOUS

## 2013-10-18 MED ORDER — HYDROCODONE-ACETAMINOPHEN 5-325 MG PO TABS
1.0000 | ORAL_TABLET | ORAL | Status: DC | PRN
  Filled 2013-10-18 (×2): qty 2

## 2013-10-18 MED ORDER — PANTOPRAZOLE SODIUM 40 MG PO TBEC
40.0000 mg | DELAYED_RELEASE_TABLET | Freq: Every day | ORAL | Status: DC
  Administered 2013-10-18 – 2013-10-19 (×2): 40 mg via ORAL
  Filled 2013-10-18 (×2): qty 1

## 2013-10-18 MED ORDER — NEOSTIGMINE METHYLSULFATE 10 MG/10ML IV SOLN
INTRAVENOUS | Status: DC | PRN
  Administered 2013-10-18: 3 mg via INTRAVENOUS

## 2013-10-18 MED ORDER — AMIODARONE HCL 200 MG PO TABS
200.0000 mg | ORAL_TABLET | Freq: Every day | ORAL | Status: DC
  Administered 2013-10-19: 200 mg via ORAL
  Filled 2013-10-18: qty 1

## 2013-10-18 MED ORDER — LACTATED RINGERS IV SOLN
INTRAVENOUS | Status: DC | PRN
  Administered 2013-10-18 (×2): via INTRAVENOUS

## 2013-10-18 MED ORDER — GLYCOPYRROLATE 0.2 MG/ML IJ SOLN
INTRAMUSCULAR | Status: DC | PRN
  Administered 2013-10-18: 0.4 mg via INTRAVENOUS

## 2013-10-18 MED ORDER — DOBUTAMINE IN D5W 4-5 MG/ML-% IV SOLN
INTRAVENOUS | Status: DC | PRN
  Administered 2013-10-18: 10 ug/kg/min via INTRAVENOUS

## 2013-10-18 MED ORDER — SODIUM CHLORIDE 0.9 % IV SOLN: 250.0000 mL | INTRAVENOUS | Status: DC | PRN

## 2013-10-18 MED ORDER — SIMVASTATIN 10 MG PO TABS
10.0000 mg | ORAL_TABLET | Freq: Every day | ORAL | Status: DC
  Administered 2013-10-18: 10 mg via ORAL
  Filled 2013-10-18 (×2): qty 1

## 2013-10-18 MED ORDER — APIXABAN 5 MG PO TABS
5.0000 mg | ORAL_TABLET | Freq: Two times a day (BID) | ORAL | Status: DC
  Administered 2013-10-18 – 2013-10-19 (×2): 5 mg via ORAL
  Filled 2013-10-18 (×4): qty 1

## 2013-10-18 MED ORDER — BUPIVACAINE HCL (PF) 0.25 % IJ SOLN
INTRAMUSCULAR | Status: AC
  Filled 2013-10-18: qty 30

## 2013-10-18 MED ORDER — INSULIN ASPART 100 UNIT/ML ~~LOC~~ SOLN
0.0000 [IU] | Freq: Three times a day (TID) | SUBCUTANEOUS | Status: DC
  Administered 2013-10-18: 11 [IU] via SUBCUTANEOUS
  Administered 2013-10-19: 8 [IU] via SUBCUTANEOUS

## 2013-10-18 MED ORDER — SUCCINYLCHOLINE CHLORIDE 20 MG/ML IJ SOLN
INTRAMUSCULAR | Status: DC | PRN
  Administered 2013-10-18: 80 mg via INTRAVENOUS
  Administered 2013-10-18: 120 mg via INTRAVENOUS

## 2013-10-18 MED ORDER — DOBUTAMINE IN D5W 4-5 MG/ML-% IV SOLN
INTRAVENOUS | Status: AC
  Filled 2013-10-18: qty 250

## 2013-10-18 MED ORDER — MIDAZOLAM HCL 5 MG/5ML IJ SOLN
INTRAMUSCULAR | Status: DC | PRN
  Administered 2013-10-18: 2 mg via INTRAVENOUS

## 2013-10-18 MED ORDER — LIDOCAINE HCL (CARDIAC) 20 MG/ML IV SOLN
INTRAVENOUS | Status: DC | PRN
  Administered 2013-10-18: 40 mg via INTRAVENOUS

## 2013-10-18 NOTE — Transfer of Care (Signed)
Immediate Anesthesia Transfer of Care Note  Patient: Lawrence Jordan  Procedure(s) Performed: Procedure(s): ATRIAL FIBRILLATION ABLATION (N/A)  Patient Location: Cath Lab  Anesthesia Type:General  Level of Consciousness: lethargic and responds to stimulation  Airway & Oxygen Therapy: Patient Spontanous Breathing and Patient connected to nasal cannula oxygen  Post-op Assessment: Report given to PACU RN and Patient moving all extremities X 4  Post vital signs: Reviewed and stable  Complications: No apparent anesthesia complications

## 2013-10-18 NOTE — Progress Notes (Signed)
Utilization Review Completed.Donne [name] T9/24/2015

## 2013-10-18 NOTE — Anesthesia Procedure Notes (Signed)
Procedure Name: Intubation Date/Time: 10/18/2013 8:02 AM Performed by: Sampson Si E Pre-anesthesia Checklist: Patient identified, Emergency Drugs available, Suction available and Patient being monitored Patient Re-evaluated:Patient Re-evaluated prior to inductionOxygen Delivery Method: Circle system utilized Preoxygenation: Pre-oxygenation with 100% oxygen Intubation Type: Rapid sequence, IV induction and Cricoid Pressure applied Laryngoscope Size: Mac and 4 Grade View: Grade I Tube type: Oral Tube size: 7.5 mm Number of attempts: 1 Airway Equipment and Method: Stylet Placement Confirmation: ETT inserted through vocal cords under direct vision,  positive ETCO2 and breath sounds checked- equal and bilateral Secured at: 23 cm Tube secured with: Tape Dental Injury: Teeth and Oropharynx as per pre-operative assessment  Comments: Elective RSI due to small oral opening, large neck, large tongue and presence of facial hair.

## 2013-10-18 NOTE — Anesthesia Postprocedure Evaluation (Signed)
  Anesthesia Post-op Note  Patient: Lawrence Jordan  Procedure(s) Performed: Procedure(s): ATRIAL FIBRILLATION ABLATION (N/A)  Patient Location: PACU  Anesthesia Type:General  Level of Consciousness: awake, alert  and oriented  Airway and Oxygen Therapy: Patient Spontanous Breathing and Patient connected to nasal cannula oxygen  Post-op Pain: mild  Post-op Assessment: Post-op Vital signs reviewed, Patient's Cardiovascular Status Stable, Respiratory Function Stable, Patent Airway and Pain level controlled  Post-op Vital Signs: stable  Last Vitals:  Filed Vitals:   10/18/13 1255  BP: 129/69  Pulse: 69  Temp:   Resp: 18    Complications: No apparent anesthesia complications

## 2013-10-18 NOTE — Progress Notes (Signed)
Three right femoral venous sheaths were removed after ACT of 124 was obtained. Manual pressure was held to the right groin for 15 minutes. Hemostasis obtained. Bed rest begins at 12:15p.m. Dressing applied and is clean, dry and intact.

## 2013-10-18 NOTE — Anesthesia Preprocedure Evaluation (Addendum)
Anesthesia Evaluation  Patient identified by MRN, date of birth, ID band Patient awake    Reviewed: Allergy & Precautions, H&P , NPO status , Patient's Chart, lab work & pertinent test results  Airway Mallampati: III TM Distance: >3 FB Neck ROM: Full    Dental  (+) Teeth Intact, Dental Advisory Given   Pulmonary shortness of breath and at rest,  breath sounds clear to auscultation        Cardiovascular hypertension, Pt. on home beta blockers + CAD Rhythm:Irregular Rate:Normal     Neuro/Psych    GI/Hepatic   Endo/Other  diabetes, Poorly Controlled, Type 2, Oral Hypoglycemic Agents  Renal/GU      Musculoskeletal   Abdominal   Peds  Hematology   Anesthesia Other Findings   Reproductive/Obstetrics                          Anesthesia Physical Anesthesia Plan  ASA: III  Anesthesia Plan: General   Post-op Pain Management:    Induction: Intravenous  Airway Management Planned: Oral ETT  Additional Equipment:   Intra-op Plan:   Post-operative Plan:   Informed Consent: I have reviewed the patients History and Physical, chart, labs and discussed the procedure including the risks, benefits and alternatives for the proposed anesthesia with the patient or authorized representative who has indicated his/her understanding and acceptance.   Dental advisory given  Plan Discussed with: CRNA and Anesthesiologist  Anesthesia Plan Comments:         Anesthesia Quick Evaluation

## 2013-10-18 NOTE — Op Note (Signed)
SURGEON:  [name], MD  PREPROCEDURE DIAGNOSES: 1. Persistent atrial fibrillation.  POSTPROCEDURE DIAGNOSES: 1. Persistent atrial fibrillation. 2. Isthmus dependant atrial flutter  PROCEDURES: 1. Comprehensive electrophysiologic study. 2. Coronary sinus pacing and recording. 3. Three-dimensional mapping of atrial fibrillation with additional mapping and ablation of atrial flutter (separate discrete mechanism) as well as additional left atrial ablation 4. Ablation of atrial fibrillation with additional mapping and ablation of atrial flutter (separate discrete mechanism) as well as additional left atrial ablation 5. Intracardiac echocardiography. 6. Transseptal puncture of an intact septum. 7. Rotational Angiography with processing at an independent workstation 8 Arrhythmia induction with pacing and dobutamine infusion   INTRODUCTION:  Lawrence Jordan is a 63 y.o. male with a history of persistent atrial fibrillation who now presents for EP study and radiofrequency ablation.  The patient reports initially being diagnosed with atrial fibrillation fter presenting with symptomatic palpitations, SOBfatgiue.  The patient has failed medical therapy with  Phyllis Ginger and amiodarone.  The patient therefore presents today for catheter ablation of atrial fibrillation and atrial flutter.  DESCRIPTION OF PROCEDURE:  Informed written consent was obtained, and the patient was brought to the electrophysiology lab in a fasting state.  The patient was adequately sedated with intravenous medications as outlined in the anesthesia report.  The patient's left and right groins were prepped and draped in the usual sterile fashion by the EP lab staff.  Using a percutaneous Seldinger technique, two 7-French and one 8-French hemostasis sheaths were placed into the right common femoral vein.  A 4- [place] hemostasis sheath was placed in the right common femoral artery for blood pressure monitoring.  An 11-French hemostasis  sheath was placed into the left common femoral vein.   3 Dimensional Rotational Angiography: A 5 french pigtail catheter was introduced through the right common femoral vein and advanced into the inferior venocava.  3 demential rotational angiography was then performed by power injection of 100cc of nonionic contrast.  Reprocessing at an independent work station was then performed.   This demonstrated a moderate sized left atrium with 4 separate pulmonary veins which were also moderate in size.  There were no anomalous veins or significant abnormalities.  A 3 dimensional rendering of the left atrium was then merged using [employer] onto the [employer] system and registered with intracardiac echo (see below).  The pigtail catheter was then removed.  Catheter Placement:  A 7-French [employer] Webster Decapolar coronary sinus catheter was introduced through the right common femoral vein and advanced into the coronary sinus for recording and pacing from this location.  A 6-French quadripolar catheter was introduced through the right common femoral vein and advanced into the right ventricle for recording and pacing.  This catheter was then pulled back to the His bundle location.    Initial Measurements: The patient presented to the electrophysiology lab in atrial fibrillation.  He spontaneously converted to sinus rhythm with catheter placement.  In sinus rhythm his average RR interval was 803 msec.  In afib his average RR interval was 400 msec.  In sinus his PR interval was 190 msec with a QRS of 94 msec and a QT interval of 333.  The AH measured 72 msec and the HV interval 53 msec.     Intracardiac Echocardiography: A 10-French [employer] Webster AcuNav intracardiac echocardiography catheter was introduced through the left common femoral vein and advanced into the right atrium. Intracardiac echocardiography was performed of the left atrium, and a three-dimensional anatomical rendering of the left  atrium was performed using CARTO sound technology.  The patient was noted to have a moderate sized left atrium.  The interatrial septum was prominent but not aneurysmal. All 4 pulmonary veins were visualized and noted to have separate ostia.  The pulmonary veins were moderate in size.  The left atrial appendage was visualized and did not reveal thrombus.   There was no evidence of pulmonary vein stenosis.   Transseptal Puncture: The middle right common femoral vein sheath was exchanged for an 8.5 [place] SL2 transseptal sheath and transseptal access was achieved in a standard fashion using a Brockenbrough needle under biplane fluoroscopy with intracardiac echocardiography confirmation of the transseptal puncture.  Once transseptal access had been achieved, heparin was administered intravenously and intra- arterially in order to maintain an ACT of greater than 300 seconds throughout the procedure.     3D Mapping and Ablation: The His bundle catheter was removed and in its place a 3.5 mm [employer] irrigated ablation catheter was advanced into the right atrium.  The transseptal sheath was pulled back into the IVC over a guidewire.  The ablation catheter was advanced across the transseptal hole using the wire as a guide.  The transseptal sheath was then re-advanced over the guidewire into the left atrium.  A duodecapolar Bios[employer] Webster circular mapping catheter was introduced through the transseptal sheath and positioned over the mouth of all 4 pulmonary veins.  Three-dimensional electroanatomical mapping was performed using CARTO technology.  This demonstrated electrical activity within all four pulmonary veins at baseline. The patient underwent successful sequential electrical isolation and anatomical encircling of all four pulmonary veins using radiofrequency current with a circular mapping catheter as a guide using a WACA technique.  Pacing was performed from within and outside of the  veins to confirm both entry and exit block. Additional left atrial ablation was performed along the roof of the left atrium, base of the left atrial appendage (LAA),  along the ridge between the left superior pulmonary vein and the left atrial appendage, the interatrial septum, along the lateral wall of the left atrium, and above the coronary sinus.    The ablation catheter was then pulled back into the right atrial and positioned along the cavo-tricuspid isthmus.  Mapping along the atrial side of the isthmus was performed.  This demonstrated a standard isthmus.  A series of radiofrequency applications were then delivered along the isthmus.  Complete bidirectional cavotricuspid isthmus block was achieved as confirmed by differential atrial pacing from the low lateral right atrium.  A stimulus to earliest atrial activation across the isthmus measured 150 msec bi-directionally.  The patient was observe without return of conduction through the isthmus.  Arrhythmia Induction with Dobutamine Dobutamine was then infused at 10 mcg/kg/min.  Rapid atrial pacing was performed which revealed inducible atrial flutter.  The surface EKG was suggestive of typical atrial flutter.  The CL was 294 msec.  The coronary sinus activation was suggestive of right atrial flutter.  1:1 AV conduction was observed which was surprisingly well tolerated.  Atrial flutter Ablation (CTI) The ablation catheter was then pulled back into the right atrial and positioned along the cavo-tricuspid isthmus.  Mapping along the atrial side of the isthmus was performed.  This demonstrated a standard isthmus.  This was confirmed with intracardiac echocardiography.  A series of radiofrequency applications were then delivered along the isthmus.  The tachycardia slowed and then terminated during ablation.  Complete bidirectional cavotricuspid isthmus block was achieved as confirmed by differential atrial  pacing from the low lateral right atrium.  A  stimulus to earliest atrial activation across the isthmus measured 150 msec bi-directionally.  The patient was observe without return of conduction through the isthmus.  Measurements Following Ablation: In sinus rhythm with RR interval was 791, with PR 129msec, QRS 97 msec, and Qtc 423 msec.  Following ablation the AH interval measured 32msec with an HV interval of 53 msec. Ventricular pacing was performed, which revealed midline decremental VA conduction with a VA Wenckebach cycle length of 470 msec.  Rapid atrial pacing was performed, which revealed an AV Wenckebach cycle length of 300 msec.  Electroisolation was then again confirmed in all four pulmonary veins.  Intracardiac echocardiography was again performed, which revealed no pericardial effusion.  The procedure was therefore considered completed.  All catheters were removed, and the sheaths were aspirated and flushed.  The patient was transferred to the recovery area for sheath removal per protocol.  A limited bedside transthoracic echocardiogram revealed no pericardial effusion. EBL<55ml.  There were no early apparent complications.  CONCLUSIONS: 1. Atrial fibrillation upon presentation with spontaneous conversion to sinus rhythm 2. Rotational Angiography reveals a moderate sized left atrium with four separate pulmonary veins without evidence of pulmonary vein stenosis. 3. Successful electrical isolation and anatomical encircling of all four pulmonary veins with radiofrequency current using a WACA approach. 3. Additional ablation was performed along the roof of the left atrium, base of the left atrial appendage (LAA),  along the ridge between the left superior pulmonary vein and the left atrial appendage, the interatrial septum, along the lateral wall of the left atrium, and above the coronary sinus. 4. Isthmus dependant right atrial flutter induced with RAP at a cycle length of 320 msec.  Atrial flutter successfully ablated along the usual  Cavo-tricuspid isthmus 5. Cavo-tricuspid isthmus ablation performed with complete bidirectional isthmus block achieved 6. No inducible arrhythmias following ablation 7. No early apparent complications.   Shambria Camerer,MD 11:22 AM 10/18/2013
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
## 2013-10-19 DIAGNOSIS — I4891 Unspecified atrial fibrillation: Secondary | ICD-10-CM

## 2013-10-19 DIAGNOSIS — I4892 Unspecified atrial flutter: Secondary | ICD-10-CM | POA: Diagnosis not present

## 2013-10-19 DIAGNOSIS — E119 Type 2 diabetes mellitus without complications: Secondary | ICD-10-CM | POA: Diagnosis not present

## 2013-10-19 LAB — BASIC METABOLIC PANEL
Anion gap: 17 — ABNORMAL HIGH (ref 5–15)
BUN: 19 mg/dL (ref 6–23)
CO2: 24 mEq/L (ref 19–32)
Calcium: 8 mg/dL — ABNORMAL LOW (ref 8.4–10.5)
Chloride: 97 mEq/L (ref 96–112)
Creatinine, Ser: 0.76 mg/dL (ref 0.50–1.35)
GFR calc Af Amer: >90 mL/min (ref >90)
GFR calc non Af Amer: >90 mL/min (ref >90)
Glucose, Bld: 297 mg/dL — ABNORMAL HIGH (ref 70–99)
Potassium: 4 mEq/L (ref 3.7–5.3)
Sodium: 138 mEq/L (ref 137–147)

## 2013-10-19 LAB — GLUCOSE, CAPILLARY: Glucose-Capillary: 268 mg/dL — ABNORMAL HIGH (ref 70–99)

## 2013-10-19 MED ORDER — OXYCODONE HCL 5 MG/5ML PO SOLN: 5.0000 mg | Freq: Once | ORAL | Status: DC | PRN

## 2013-10-19 MED ORDER — OXYCODONE HCL 5 MG PO TABS
5.0000 mg | ORAL_TABLET | Freq: Once | ORAL | Status: DC | PRN
  Filled 2013-10-19 (×4): qty 1

## 2013-10-19 MED ORDER — ONDANSETRON HCL 4 MG/2ML IJ SOLN: 4.0000 mg | Freq: Once | INTRAMUSCULAR | Status: DC | PRN

## 2013-10-19 MED ORDER — HYDROMORPHONE HCL 1 MG/ML IJ SOLN
0.2500 mg | INTRAMUSCULAR | Status: DC | PRN
Start: 2013-10-19 — End: 2013-10-19

## 2013-10-19 NOTE — Discharge Instructions (Signed)
No driving for 5 days. No lifting over 5 lbs for 1 week. No sexual activity for 1 week. You may return to work in 1 week. Keep procedure site clean & dry. If you notice increased pain, swelling, bleeding or pus, call/return!  You may shower, but no soaking baths/hot tubs/pools for 1 week.  ° ° °

## 2013-10-19 NOTE — Discharge Summary (Signed)
Physician Discharge Summary      Patient ID: Lawrence Jordan MRN: [id number] DOB/AGE: Jan 27, 1950 63 y.o.  Admit date: 10/18/2013 Discharge date: 10/19/2013  Primary Discharge Diagnosis  Atrial fibrillation Secondary Discharge Diagnosis Atrial flutter, diabetes mellitus (poorly controlled), morbid obesity, hypertension, snoring  Significant Diagnostic Studies:  EP study and ablation for atrial fibrillation  Hospital Course:   The patient was admitted for elective atrial fibrillation ablation.  He underwent pulmonary vein isolation with additional ablation within the left atrium.  He also had inducible atrial flutter with 1:1 conduction which was ablated along the cavotricuspid isthmus.  He was observed overnight without complication.  He was noted to have elevated blood sugar readings of 400 mg/dl for which he received sliding scale coverage.  His wife notes that he does not check his blood sugars routinely despite a diagnosis of diabetes.  Compliance was encouraged.  Compliance with previously recommended sleep study was also encouraged.  He is instructed to contact Dr Nadara Mustard to arrange for these.   Discharge Exam: Blood pressure 120/63, pulse 71, temperature 97.8 F (36.6 C), temperature source Oral, resp. rate 22, height 5\' 11"  (1.803 m), weight 335 lb (151.955 kg), SpO2 95.00%.  Physical Exam: Filed Vitals:   10/19/13 0200 10/19/13 0300 10/19/13 0400 10/19/13 0724  BP: 103/55 121/70 121/70 120/63  Pulse: 66 66 64 71  Temp:  98.4 F (36.9 C)  97.8 F (36.6 C)  TempSrc:  Oral  Oral  Resp: 16 17  22   Height:      Weight:      SpO2: 93% 93% 92% 95%    GEN- The patient is well appearing, alert and oriented x 3 today.   Head- normocephalic, atraumatic Eyes-  Sclera clear, conjunctiva pink Ears- hearing intact Oropharynx- clear Neck- supple, Lungs- Clear to ausculation bilaterally, normal work of breathing Heart- Regular rate and rhythm, no murmurs, rubs or gallops, PMI not  laterally displaced GI- soft, NT, ND, + BS Extremities- no clubbing, cyanosis, or edema, groin is without hematoma/ bruit MS- no significant deformity or atrophy Skin- no rash or lesion Psych- euthymic mood, full affect Neuro- strength and sensation are intact  At time of discharge, the patient was alert, ambulatory, and complained of only a very mild cough without shortness of breath, chest pain, odynophagia, or other.  Labs:   Lab Results  Component Value Date   WBC 6.8 10/11/2013   HGB 14.6 10/11/2013   HCT 42.5 10/11/2013   MCV 95.0 10/11/2013   PLT 201.0 10/11/2013    Recent Labs Lab 10/19/13 0350  NA 138  K 4.0  CL 97  CO2 24  BUN 19  CREATININE 0.76  CALCIUM 8.0*  GLUCOSE 297*   EKG: reviewed  FOLLOW UP PLANS AND APPOINTMENTS    Medication List         acetaminophen 500 MG tablet  Commonly known as:  TYLENOL  Take 1,000 mg by mouth 2 (two) times daily.     amiodarone 200 MG tablet  Commonly known as:  PACERONE  Take 1 tablet (200 mg total) by mouth daily.     apixaban 5 MG Tabs tablet  Commonly known as:  ELIQUIS  Take 1 tablet (5 mg total) by mouth 2 (two) times daily.     furosemide 40 MG tablet  Commonly known as:  LASIX  Take 40 mg by mouth daily.     hydrochlorothiazide 25 MG tablet  Commonly known as:  HYDRODIURIL  Take 1 tablet (25 mg  total) by mouth daily.     metFORMIN 500 MG tablet  Commonly known as:  GLUCOPHAGE  Take 1,000 mg by mouth 2 (two) times daily with a meal.     metoprolol 50 MG tablet  Commonly known as:  LOPRESSOR  Take 1 tablet (50 mg total) by mouth 2 (two) times daily.     multivitamin tablet  Take 1 tablet by mouth daily.     pantoprazole 40 MG tablet  Commonly known as:  PROTONIX  Take 1 tablet (40 mg total) by mouth daily.     potassium chloride SA 20 MEQ tablet  Commonly known as:  K-DUR,KLOR-CON  Take 1 tablet (20 mEq total) by mouth 2 (two) times daily.     simvastatin 10 MG tablet  Commonly known as:   ZOCOR  Take 10 mg by mouth at bedtime.     VICTOZA 18 MG/3ML Sopn  Generic drug:  Liraglutide  Inject 18 mg into the skin daily.           Follow-up Information   Follow up with CARROLL,DONNA, NP In 4 weeks. (Afib nurse practitioner to follow-up post ablation)    Specialty:  Nurse Practitioner   Contact information:   [place] [address] [phone]       Follow up with [name], MD In 3 months.   Specialty:  Cardiology   Contact information:   [place] [address] [phone]       [place] TO FOLLOW UP APPOINTMENTS  Time spent with patient to include physician time: 30 minutes Signed: [name], MD 10/19/2013, 8:10 AM

## 2013-10-20 ENCOUNTER — Encounter (HOSPITAL_COMMUNITY): Payer: Self-pay | Admitting: *Deleted

## 2013-10-27 ENCOUNTER — Encounter (HOSPITAL_COMMUNITY): Payer: Self-pay | Admitting: *Deleted

## 2013-10-28 ENCOUNTER — Other Ambulatory Visit: Payer: Self-pay | Admitting: Physician Assistant

## 2013-11-02 ENCOUNTER — Other Ambulatory Visit: Payer: Self-pay | Admitting: *Deleted

## 2013-11-02 ENCOUNTER — Encounter: Payer: Self-pay | Admitting: Endocrinology

## 2013-11-02 ENCOUNTER — Ambulatory Visit (INDEPENDENT_AMBULATORY_CARE_PROVIDER_SITE_OTHER): Payer: 59 | Admitting: Endocrinology

## 2013-11-02 VITALS — BP 124/82 | HR 75 | Temp 97.7°F | Resp 18 | Ht 71.0 in | Wt 326.0 lb

## 2013-11-02 DIAGNOSIS — E1165 Type 2 diabetes mellitus with hyperglycemia: Secondary | ICD-10-CM

## 2013-11-02 DIAGNOSIS — IMO0002 Reserved for concepts with insufficient information to code with codable children: Secondary | ICD-10-CM

## 2013-11-02 DIAGNOSIS — E781 Pure hyperglyceridemia: Secondary | ICD-10-CM

## 2013-11-02 DIAGNOSIS — E119 Type 2 diabetes mellitus without complications: Secondary | ICD-10-CM

## 2013-11-02 DIAGNOSIS — Z23 Encounter for immunization: Secondary | ICD-10-CM

## 2013-11-02 LAB — GLUCOSE, POCT (MANUAL RESULT ENTRY): POC Glucose: 291 mg/dl — AB (ref 70–99)

## 2013-11-02 MED ORDER — INSULIN LISPRO PROT & LISPRO (75-25 MIX) 100 UNIT/ML KWIKPEN: PEN_INJECTOR | SUBCUTANEOUS | Status: DC

## 2013-11-02 MED ORDER — GLUCOSE BLOOD VI STRP: ORAL_STRIP | Status: DC

## 2013-11-02 MED ORDER — ONETOUCH DELICA LANCETS FINE MISC: Status: DC

## 2013-11-02 MED ORDER — CANAGLIFLOZIN 100 MG PO TABS: 100.0000 mg | ORAL_TABLET | Freq: Every day | ORAL | Status: DC
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
## 2013-11-02 NOTE — Patient Instructions (Addendum)
Please check blood sugars at least half the time about 2 hours after any meal and 4-6 times per week on waking up.  Please bring blood sugar monitor to each visit  Start Invokana 100 mg daily before breakfast Stop hydrochlorothiazide  HUMALOG mix insulin: Start taking 10 units before breakfast and supper On Sunday if the blood sugar before breakfast is over 150 increase the EVENING dose to 12 units and if the supper time glucose is still over 150 increase the morning dose to 12 units

## 2013-11-07 ENCOUNTER — Encounter: Payer: 59 | Attending: Endocrinology | Admitting: Nutrition

## 2013-11-07 VITALS — Wt 324.6 lb

## 2013-11-07 DIAGNOSIS — IMO0002 Reserved for concepts with insufficient information to code with codable children: Secondary | ICD-10-CM

## 2013-11-07 DIAGNOSIS — Z713 Dietary counseling and surveillance: Secondary | ICD-10-CM | POA: Insufficient documentation

## 2013-11-07 DIAGNOSIS — E118 Type 2 diabetes mellitus with unspecified complications: Secondary | ICD-10-CM | POA: Insufficient documentation

## 2013-11-07 DIAGNOSIS — E1165 Type 2 diabetes mellitus with hyperglycemia: Secondary | ICD-10-CM

## 2013-11-07 NOTE — Progress Notes (Signed)
Discussed insulin resistance a ways to decrease this.--exercise, wt. Loss and lower blood sugar readings.   Exercise: Pt. Is walking for 30-40 min. 5 days/wk.  Pt. Is using portion control in all foods eaten.  Weight is down 2 pounds since Friday  Discussed the need for balanced meals--eating all food groups--carbohydrates, proteins and fats, and the need to limit their amounts in each meal to lower blood sugars after meal.  He reported good understanding of this.  He was told to see the dietitian for more specific quantities to eat.  He agreed to do this.  He travels a lot and eats out 1/2 the time while at home.  Discussed ways to order foods when eating out to limit fats.  Says he is using the [employer] book that Dr. Dwyane Dee gave him.  Motivation for weight loss is high at this time.    Insulin dose:  He reports no problems with taking his insulin, and has adjusted the dosages to 12 BID.  His FBS today was 168, and yesterday was 166.  His 2hr. pc readings on all meals are less than 50 points higher than the premeal readings.  He was praised for this.  SBGM:  Patient is having trouble getting a good drop of blood, and has wasted many strips.  He was given a different lancet device with a stronger spring in it,      We discussed the timing of his insulin and the need to eat lunch every day.  He reported good understanding of this and had no final questions.  He will call me if he has any questions.

## 2013-11-07 NOTE — Patient Instructions (Signed)
Continue to exercise for 40 min. 5 days/wk. Continue to increase the PM  insulin dose by 1-2 units every 3 days until the FBS is less than 120.   Continue to limit meat portion sizes to no more than 4 ounces at supper, and order all fats at restaurants "on the side".

## 2013-11-16 ENCOUNTER — Other Ambulatory Visit (HOSPITAL_COMMUNITY): Payer: Self-pay | Admitting: *Deleted

## 2013-11-16 ENCOUNTER — Encounter (HOSPITAL_COMMUNITY): Payer: Self-pay | Admitting: Nurse Practitioner

## 2013-11-16 ENCOUNTER — Ambulatory Visit (HOSPITAL_COMMUNITY)
Admission: RE | Admit: 2013-11-16 | Discharge: 2013-11-16 | Disposition: A | Discharge status: Home / Self Care | Payer: 59 | Source: Ambulatory Visit | Attending: Nurse Practitioner | Admitting: Nurse Practitioner

## 2013-11-16 VITALS — BP 110/72 | HR 62 | Ht 71.0 in | Wt 322.8 lb

## 2013-11-16 DIAGNOSIS — E876 Hypokalemia: Secondary | ICD-10-CM | POA: Diagnosis not present

## 2013-11-16 DIAGNOSIS — Z794 Long term (current) use of insulin: Secondary | ICD-10-CM | POA: Diagnosis not present

## 2013-11-16 DIAGNOSIS — I2510 Atherosclerotic heart disease of native coronary artery without angina pectoris: Secondary | ICD-10-CM | POA: Insufficient documentation

## 2013-11-16 DIAGNOSIS — E780 Pure hypercholesterolemia: Secondary | ICD-10-CM | POA: Diagnosis not present

## 2013-11-16 DIAGNOSIS — I4892 Unspecified atrial flutter: Secondary | ICD-10-CM | POA: Insufficient documentation

## 2013-11-16 DIAGNOSIS — I481 Persistent atrial fibrillation: Secondary | ICD-10-CM | POA: Diagnosis present

## 2013-11-16 DIAGNOSIS — E1165 Type 2 diabetes mellitus with hyperglycemia: Secondary | ICD-10-CM | POA: Insufficient documentation

## 2013-11-16 DIAGNOSIS — I10 Essential (primary) hypertension: Secondary | ICD-10-CM

## 2013-11-16 DIAGNOSIS — Z7901 Long term (current) use of anticoagulants: Secondary | ICD-10-CM | POA: Insufficient documentation

## 2013-11-16 DIAGNOSIS — I4819 Other persistent atrial fibrillation: Secondary | ICD-10-CM
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
## 2013-11-16 NOTE — Patient Instructions (Addendum)
Your physician recommends that you schedule a follow-up appointment in: as scheduled

## 2013-11-30 ENCOUNTER — Ambulatory Visit (INDEPENDENT_AMBULATORY_CARE_PROVIDER_SITE_OTHER): Payer: 59 | Admitting: Endocrinology

## 2013-11-30 ENCOUNTER — Encounter: Payer: Self-pay | Admitting: Endocrinology

## 2013-11-30 VITALS — BP 108/74 | HR 61 | Temp 97.8°F | Resp 16 | Ht 71.0 in | Wt 316.8 lb

## 2013-11-30 DIAGNOSIS — E1165 Type 2 diabetes mellitus with hyperglycemia: Secondary | ICD-10-CM

## 2013-11-30 DIAGNOSIS — IMO0002 Reserved for concepts with insufficient information to code with codable children: Secondary | ICD-10-CM

## 2013-11-30 DIAGNOSIS — I10 Essential (primary) hypertension: Secondary | ICD-10-CM

## 2013-11-30 LAB — BASIC METABOLIC PANEL
BUN: 12 mg/dL (ref 6–23)
CO2: 25 mEq/L (ref 19–32)
Calcium: 9.5 mg/dL (ref 8.4–10.5)
Chloride: 101 mEq/L (ref 96–112)
Creatinine, Ser: 1 mg/dL (ref 0.4–1.5)
GFR: 85.11 mL/min (ref >60.00)
Glucose, Bld: 112 mg/dL — ABNORMAL HIGH (ref 70–99)
Potassium: 4 mEq/L (ref 3.5–5.1)
Sodium: 135 mEq/L (ref 135–145)

## 2013-11-30 LAB — VITAMIN D 25 HYDROXY (VIT D DEFICIENCY, FRACTURES): VITD: 24.44 ng/mL — ABNORMAL LOW (ref 30.00–100.00)

## 2013-11-30 LAB — GLUCOSE, POCT (MANUAL RESULT ENTRY): POC Glucose: 138 mg/dl — AB (ref 70–99)

## 2013-11-30 NOTE — Patient Instructions (Addendum)
Reduce Lasix to 1/2 tab  Increase exercise as tolerated  Reduce am insulin to 20 if  Afternoon sugars are <90 Take insulin within 15 min of the meal  Please check blood sugars at least half the time about 2 hours after any meal and times per week on waking up.   Please bring blood sugar monitor to each visit
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
## 2013-12-02 NOTE — Progress Notes (Signed)
Quick Note:  Please let patient know that the vitamin D is mildly low, needs to add OTC Vitamin D3, 2000 units daily, rest ok  ______

## 2013-12-03 LAB — FRUCTOSAMINE: Fructosamine: 246 umol/L (ref 190–270)

## 2013-12-09 ENCOUNTER — Other Ambulatory Visit: Payer: Self-pay | Admitting: Cardiology

## 2013-12-15 ENCOUNTER — Other Ambulatory Visit: Payer: Self-pay | Admitting: Cardiology

## 2013-12-22 ENCOUNTER — Other Ambulatory Visit: Payer: Self-pay | Admitting: Internal Medicine

## 2013-12-22 ENCOUNTER — Other Ambulatory Visit: Payer: Self-pay | Admitting: Cardiology

## 2013-12-22 ENCOUNTER — Other Ambulatory Visit: Payer: Self-pay | Admitting: Endocrinology

## 2014-01-03 ENCOUNTER — Encounter (HOSPITAL_COMMUNITY): Payer: Self-pay | Admitting: Internal Medicine

## 2014-01-16 ENCOUNTER — Ambulatory Visit (INDEPENDENT_AMBULATORY_CARE_PROVIDER_SITE_OTHER): Payer: 59 | Admitting: Internal Medicine

## 2014-01-16 ENCOUNTER — Encounter: Payer: Self-pay | Admitting: Endocrinology

## 2014-01-16 ENCOUNTER — Encounter: Payer: Self-pay | Admitting: Internal Medicine

## 2014-01-16 ENCOUNTER — Ambulatory Visit (INDEPENDENT_AMBULATORY_CARE_PROVIDER_SITE_OTHER): Payer: 59 | Admitting: Endocrinology

## 2014-01-16 VITALS — BP 124/82 | HR 66 | Ht 71.0 in | Wt 311.0 lb

## 2014-01-16 VITALS — BP 119/69 | HR 63 | Temp 98.3°F | Resp 14 | Ht 71.0 in | Wt 310.0 lb

## 2014-01-16 DIAGNOSIS — I481 Persistent atrial fibrillation: Secondary | ICD-10-CM

## 2014-01-16 DIAGNOSIS — I10 Essential (primary) hypertension: Secondary | ICD-10-CM

## 2014-01-16 DIAGNOSIS — I4819 Other persistent atrial fibrillation: Secondary | ICD-10-CM

## 2014-01-16 DIAGNOSIS — E782 Mixed hyperlipidemia: Secondary | ICD-10-CM

## 2014-01-16 DIAGNOSIS — I482 Chronic atrial fibrillation, unspecified: Secondary | ICD-10-CM

## 2014-01-16 DIAGNOSIS — E1165 Type 2 diabetes mellitus with hyperglycemia: Secondary | ICD-10-CM

## 2014-01-16 DIAGNOSIS — IMO0002 Reserved for concepts with insufficient information to code with codable children: Secondary | ICD-10-CM

## 2014-01-16 LAB — LIPID PANEL
Cholesterol: 149 mg/dL (ref 0–200)
HDL: 34.4 mg/dL — ABNORMAL LOW (ref >39.00)
LDL Cholesterol: 77 mg/dL (ref 0–99)
NonHDL: 114.6
Total CHOL/HDL Ratio: 4
Triglycerides: 189 mg/dL — ABNORMAL HIGH (ref 0.0–149.0)
VLDL: 37.8 mg/dL (ref 0.0–40.0)

## 2014-01-16 LAB — MICROALBUMIN / CREATININE URINE RATIO
Creatinine,U: 178.7 mg/dL
Microalb Creat Ratio: 1.5 mg/g (ref 0.0–30.0)
Microalb, Ur: 2.6 mg/dL — ABNORMAL HIGH (ref 0.0–1.9)

## 2014-01-16 LAB — HEMOGLOBIN A1C: Hgb A1c MFr Bld: 6.4 % (ref 4.6–6.5)

## 2014-01-16 NOTE — Patient Instructions (Addendum)
Your physician has recommended you make the following change in your medication: STOP Amiodarone   Your physician recommends that you schedule a follow-up appointment in: 3 Months with Dr. [name].

## 2014-01-16 NOTE — Patient Instructions (Signed)
Please check blood sugars at least half the time about 2 hours after any meal and times per week on waking up.  Please bring blood sugar monitor to each visit
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
## 2014-01-17 NOTE — Progress Notes (Signed)
Quick Note:  : A1c excellent at 6.4. Good cholesterol relatively low, needs exercise and weight loss ______

## 2014-01-23 ENCOUNTER — Other Ambulatory Visit: Payer: Self-pay | Admitting: *Deleted

## 2014-01-23 MED ORDER — FUROSEMIDE 40 MG PO TABS: 20.0000 mg | ORAL_TABLET | Freq: Every day | ORAL | Status: DC

## 2014-01-23 MED ORDER — METOPROLOL TARTRATE 50 MG PO TABS: 50.0000 mg | ORAL_TABLET | Freq: Two times a day (BID) | ORAL | Status: DC

## 2014-02-07 ENCOUNTER — Encounter (HOSPITAL_COMMUNITY): Payer: Self-pay | Admitting: Cardiology

## 2014-02-07 ENCOUNTER — Other Ambulatory Visit: Payer: Self-pay

## 2014-02-07 ENCOUNTER — Telehealth: Payer: Self-pay | Admitting: Internal Medicine

## 2014-02-07 MED ORDER — APIXABAN 5 MG PO TABS: ORAL_TABLET | ORAL | Status: DC

## 2014-02-07 NOTE — Telephone Encounter (Signed)
Walk in pt form " needs rx called in" gave to [employer]

## 2014-02-16 ENCOUNTER — Other Ambulatory Visit: Payer: Self-pay | Admitting: Cardiology

## 2014-02-16 ENCOUNTER — Other Ambulatory Visit: Payer: Self-pay | Admitting: Endocrinology

## 2014-02-27 ENCOUNTER — Telehealth: Payer: Self-pay | Admitting: Endocrinology

## 2014-02-27 ENCOUNTER — Other Ambulatory Visit: Payer: Self-pay

## 2014-02-27 MED ORDER — POTASSIUM CHLORIDE CRYS ER 20 MEQ PO TBCR: 20.0000 meq | EXTENDED_RELEASE_TABLET | Freq: Two times a day (BID) | ORAL | Status: DC

## 2014-02-27 MED ORDER — ONETOUCH DELICA LANCETS FINE MISC: Status: DC

## 2014-02-27 NOTE — Telephone Encounter (Signed)
Patient need refill for one touch delica lancets

## 2014-02-27 NOTE — Telephone Encounter (Signed)
Done

## 2014-02-28 ENCOUNTER — Other Ambulatory Visit (HOSPITAL_COMMUNITY): Payer: Self-pay | Admitting: Family Medicine

## 2014-02-28 ENCOUNTER — Ambulatory Visit (HOSPITAL_COMMUNITY)
Admission: RE | Admit: 2014-02-28 | Discharge: 2014-02-28 | Disposition: A | Discharge status: Home / Self Care | Payer: Disability Insurance | Source: Ambulatory Visit | Attending: Family Medicine | Admitting: Family Medicine

## 2014-02-28 DIAGNOSIS — M11262 Other chondrocalcinosis, left knee: Secondary | ICD-10-CM | POA: Insufficient documentation

## 2014-02-28 DIAGNOSIS — M25561 Pain in right knee: Secondary | ICD-10-CM | POA: Diagnosis not present

## 2014-02-28 DIAGNOSIS — M25551 Pain in right hip: Secondary | ICD-10-CM | POA: Diagnosis not present

## 2014-02-28 DIAGNOSIS — M25562 Pain in left knee: Secondary | ICD-10-CM | POA: Diagnosis not present

## 2014-02-28 DIAGNOSIS — Z0289 Encounter for other administrative examinations: Secondary | ICD-10-CM | POA: Insufficient documentation

## 2014-02-28 DIAGNOSIS — M25552 Pain in left hip: Secondary | ICD-10-CM | POA: Insufficient documentation

## 2014-02-28 DIAGNOSIS — Z96643 Presence of artificial hip joint, bilateral: Secondary | ICD-10-CM | POA: Insufficient documentation

## 2014-03-14 ENCOUNTER — Telehealth: Payer: Self-pay | Admitting: Endocrinology

## 2014-03-14 NOTE — Telephone Encounter (Signed)
Pt  Has lab apt on 04/12/14 and he called and ask tht Dr. Dwyane Dee add to his lab test to have a PSA done.  Please call pt back and confirm if this can be done.  Thank you Baxter Flattery

## 2014-03-14 NOTE — Telephone Encounter (Signed)
Please read message below.  

## 2014-03-15 NOTE — Telephone Encounter (Signed)
Okay to add test?

## 2014-03-17 NOTE — Telephone Encounter (Signed)
Would prefer Dr Nadara Mustard PCP to confirm this needs to be done

## 2014-03-18 NOTE — Telephone Encounter (Signed)
Noted, patient is aware. 

## 2014-04-12 ENCOUNTER — Other Ambulatory Visit (HOSPITAL_COMMUNITY)
Admission: RE | Admit: 2014-04-12 | Discharge: 2014-04-12 | Disposition: A | Discharge status: Home / Self Care | Payer: BLUE CROSS/BLUE SHIELD | Source: Ambulatory Visit | Attending: Endocrinology | Admitting: Endocrinology

## 2014-04-12 DIAGNOSIS — I4891 Unspecified atrial fibrillation: Secondary | ICD-10-CM | POA: Insufficient documentation

## 2014-04-12 DIAGNOSIS — I10 Essential (primary) hypertension: Secondary | ICD-10-CM | POA: Diagnosis not present

## 2014-04-12 DIAGNOSIS — E119 Type 2 diabetes mellitus without complications: Secondary | ICD-10-CM | POA: Insufficient documentation

## 2014-04-12 DIAGNOSIS — Z794 Long term (current) use of insulin: Secondary | ICD-10-CM | POA: Diagnosis not present

## 2014-04-12 LAB — BASIC METABOLIC PANEL
Anion gap: 9 (ref 5–15)
BUN: 16 mg/dL (ref 6–23)
CO2: 23 mmol/L (ref 19–32)
Calcium: 9.6 mg/dL (ref 8.4–10.5)
Chloride: 105 mmol/L (ref 96–112)
Creatinine, Ser: 0.95 mg/dL (ref 0.50–1.35)
GFR calc Af Amer: >90 mL/min (ref >90)
GFR calc non Af Amer: 87 mL/min — ABNORMAL LOW (ref >90)
Glucose, Bld: 96 mg/dL (ref 70–99)
Potassium: 4.2 mmol/L (ref 3.5–5.1)
Sodium: 137 mmol/L (ref 135–145)

## 2014-04-13 LAB — HEMOGLOBIN A1C
Hgb A1c MFr Bld: 6 % — ABNORMAL HIGH (ref 4.8–5.6)
Mean Plasma Glucose: 126 mg/dL

## 2014-04-17 ENCOUNTER — Ambulatory Visit (INDEPENDENT_AMBULATORY_CARE_PROVIDER_SITE_OTHER): Payer: BLUE CROSS/BLUE SHIELD | Admitting: Endocrinology

## 2014-04-17 ENCOUNTER — Encounter: Payer: Self-pay | Admitting: Endocrinology

## 2014-04-17 ENCOUNTER — Encounter: Payer: Self-pay | Admitting: Internal Medicine

## 2014-04-17 ENCOUNTER — Ambulatory Visit (INDEPENDENT_AMBULATORY_CARE_PROVIDER_SITE_OTHER): Payer: BLUE CROSS/BLUE SHIELD | Admitting: Internal Medicine

## 2014-04-17 VITALS — BP 132/82 | HR 63 | Ht 71.0 in | Wt 303.2 lb

## 2014-04-17 VITALS — BP 132/79 | HR 63 | Temp 98.5°F | Resp 16 | Ht 71.0 in | Wt 300.0 lb

## 2014-04-17 DIAGNOSIS — I10 Essential (primary) hypertension: Secondary | ICD-10-CM | POA: Diagnosis not present

## 2014-04-17 DIAGNOSIS — E782 Mixed hyperlipidemia: Secondary | ICD-10-CM | POA: Diagnosis not present

## 2014-04-17 DIAGNOSIS — I2510 Atherosclerotic heart disease of native coronary artery without angina pectoris: Secondary | ICD-10-CM

## 2014-04-17 DIAGNOSIS — I481 Persistent atrial fibrillation: Secondary | ICD-10-CM | POA: Diagnosis not present

## 2014-04-17 DIAGNOSIS — E1165 Type 2 diabetes mellitus with hyperglycemia: Secondary | ICD-10-CM

## 2014-04-17 DIAGNOSIS — I4819 Other persistent atrial fibrillation: Secondary | ICD-10-CM

## 2014-04-17 DIAGNOSIS — IMO0002 Reserved for concepts with insufficient information to code with codable children: Secondary | ICD-10-CM

## 2014-04-17 NOTE — Patient Instructions (Signed)
Your physician wants you to follow-up in: 6 months with Dr Vallery Ridge will receive a reminder letter in the mail two months in advance. If you don't receive a letter, please call our office to schedule the follow-up appointment.  Your physician recommends that you continue on your current medications as directed. Please refer to the Current Medication list given to you today.

## 2014-04-17 NOTE — Patient Instructions (Signed)
Please check blood sugars at least half the time about 2 hours after any meal and 3 times per week on waking up. Please bring blood sugar monitor to each visit. Recommended blood sugar levels about 2 hours after meal is 140-180 and on waking up 90-130  Reduce am insulin if sugars <80 at lunch or supper
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
## 2014-04-20 ENCOUNTER — Other Ambulatory Visit: Payer: Self-pay | Admitting: Endocrinology

## 2014-04-30 ENCOUNTER — Other Ambulatory Visit: Payer: Self-pay | Admitting: Endocrinology

## 2014-05-03 ENCOUNTER — Telehealth: Payer: Self-pay | Admitting: *Deleted

## 2014-05-03 NOTE — Telephone Encounter (Signed)
Patient left a vm on refill line stating that he was informed that the eliquis requires a prior authorization. Please advise. Thanks, MI

## 2014-05-06 ENCOUNTER — Telehealth: Payer: Self-pay | Admitting: *Deleted

## 2014-05-06 NOTE — Telephone Encounter (Signed)
S/w Damien @ [phone] for Prior Auth.  Case # [id number] Approved till May 06, 2015.

## 2014-05-06 NOTE — Telephone Encounter (Signed)
Call Documentation      Tamsen Snider at 05/06/2014 9:51 AM     Status: Signed       Expand All Collapse All   S/w Damien @ [phone] for Prior Auth. Case # [id number] Approved till May 06, 2015.

## 2014-05-20 NOTE — Telephone Encounter (Signed)
ERROR

## 2014-05-27 ENCOUNTER — Other Ambulatory Visit: Payer: Self-pay | Admitting: Endocrinology

## 2014-06-05 ENCOUNTER — Encounter: Payer: Self-pay | Admitting: Endocrinology

## 2014-06-05 ENCOUNTER — Other Ambulatory Visit: Payer: Self-pay | Admitting: *Deleted

## 2014-06-05 ENCOUNTER — Telehealth: Payer: Self-pay | Admitting: Endocrinology

## 2014-06-05 MED ORDER — DULAGLUTIDE 1.5 MG/0.5ML ~~LOC~~ SOAJ: SUBCUTANEOUS | Status: DC

## 2014-06-05 NOTE — Telephone Encounter (Signed)
Patient stated that his Victoza was changed to Trulicity and his b/s has gotten higher, please advise

## 2014-06-11 DIAGNOSIS — C4491 Basal cell carcinoma of skin, unspecified: Secondary | ICD-10-CM

## 2014-06-11 HISTORY — DX: Basal cell carcinoma of skin, unspecified: C44.91

## 2014-06-22 ENCOUNTER — Other Ambulatory Visit: Payer: Self-pay | Admitting: Internal Medicine

## 2014-06-22 ENCOUNTER — Other Ambulatory Visit: Payer: Self-pay | Admitting: Endocrinology

## 2014-06-24 ENCOUNTER — Other Ambulatory Visit: Payer: Self-pay | Admitting: Internal Medicine

## 2014-06-25 ENCOUNTER — Other Ambulatory Visit: Payer: Self-pay

## 2014-06-25 NOTE — Telephone Encounter (Signed)
Per note 3.23.16 

## 2014-07-07 ENCOUNTER — Other Ambulatory Visit: Payer: Self-pay | Admitting: Internal Medicine

## 2014-07-18 ENCOUNTER — Ambulatory Visit (INDEPENDENT_AMBULATORY_CARE_PROVIDER_SITE_OTHER): Payer: BLUE CROSS/BLUE SHIELD | Admitting: Endocrinology

## 2014-07-18 ENCOUNTER — Encounter: Payer: Self-pay | Admitting: Endocrinology

## 2014-07-18 ENCOUNTER — Other Ambulatory Visit (INDEPENDENT_AMBULATORY_CARE_PROVIDER_SITE_OTHER): Payer: BLUE CROSS/BLUE SHIELD

## 2014-07-18 VITALS — BP 120/74 | HR 74 | Temp 97.9°F | Resp 16 | Ht 71.0 in | Wt 306.0 lb

## 2014-07-18 DIAGNOSIS — Z23 Encounter for immunization: Secondary | ICD-10-CM | POA: Diagnosis not present

## 2014-07-18 DIAGNOSIS — E1165 Type 2 diabetes mellitus with hyperglycemia: Secondary | ICD-10-CM

## 2014-07-18 DIAGNOSIS — IMO0002 Reserved for concepts with insufficient information to code with codable children: Secondary | ICD-10-CM

## 2014-07-18 DIAGNOSIS — C4491 Basal cell carcinoma of skin, unspecified: Secondary | ICD-10-CM

## 2014-07-18 HISTORY — DX: Basal cell carcinoma of skin, unspecified: C44.91

## 2014-07-18 LAB — BASIC METABOLIC PANEL
BUN: 16 mg/dL (ref 6–23)
CO2: 24 mEq/L (ref 19–32)
Calcium: 10.6 mg/dL — ABNORMAL HIGH (ref 8.4–10.5)
Chloride: 103 mEq/L (ref 96–112)
Creatinine, Ser: 0.96 mg/dL (ref 0.40–1.50)
GFR: 83.92 mL/min (ref >60.00)
Glucose, Bld: 92 mg/dL (ref 70–99)
Potassium: 3.8 mEq/L (ref 3.5–5.1)
Sodium: 135 mEq/L (ref 135–145)

## 2014-07-18 LAB — HEMOGLOBIN A1C: Hgb A1c MFr Bld: 5.9 % (ref 4.6–6.5)

## 2014-07-18 MED ORDER — CANAGLIFLOZIN 300 MG PO TABS: 300.0000 mg | ORAL_TABLET | Freq: Every day | ORAL | Status: DC
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
## 2014-07-18 NOTE — Patient Instructions (Addendum)
Check blood sugars on waking up .Marland Kitchen4-5 .Marland Kitchen times a week Also check blood sugars about 2 hours after a meal and do this after different meals by rotation  Recommended blood sugar levels on waking up is 90-130 and about 2 hours after meal is 140-180 Please bring blood sugar monitor to each visit.  Reduce am insulin to 20 and further if supper readings<100  Invokana 2 pills in am then go next Rx

## 2014-07-19 NOTE — Progress Notes (Signed)
Quick Note:  Please let patient know that the A1c is excellent at 5.9. Calcium is a fraction higher than normal and will need to periodically check this. Avoid any calcium supplements for now ______

## 2014-07-22 ENCOUNTER — Other Ambulatory Visit: Payer: Self-pay | Admitting: Endocrinology

## 2014-07-24 ENCOUNTER — Other Ambulatory Visit: Payer: Self-pay | Admitting: Internal Medicine

## 2014-07-24 ENCOUNTER — Other Ambulatory Visit: Payer: Self-pay | Admitting: Endocrinology

## 2014-07-27 ENCOUNTER — Other Ambulatory Visit: Payer: Self-pay | Admitting: Internal Medicine

## 2014-08-05 ENCOUNTER — Other Ambulatory Visit: Payer: Self-pay | Admitting: Endocrinology

## 2014-09-13 ENCOUNTER — Other Ambulatory Visit: Payer: Self-pay | Admitting: Internal Medicine

## 2014-09-22 ENCOUNTER — Other Ambulatory Visit: Payer: Self-pay | Admitting: Endocrinology

## 2014-09-28 ENCOUNTER — Other Ambulatory Visit: Payer: Self-pay | Admitting: Endocrinology

## 2014-09-28 ENCOUNTER — Other Ambulatory Visit: Payer: Self-pay | Admitting: Internal Medicine

## 2014-10-01 ENCOUNTER — Other Ambulatory Visit: Payer: Self-pay

## 2014-10-13 ENCOUNTER — Other Ambulatory Visit: Payer: Self-pay | Admitting: Internal Medicine

## 2014-10-14 ENCOUNTER — Ambulatory Visit (INDEPENDENT_AMBULATORY_CARE_PROVIDER_SITE_OTHER): Payer: BLUE CROSS/BLUE SHIELD | Admitting: Internal Medicine

## 2014-10-14 ENCOUNTER — Encounter: Payer: Self-pay | Admitting: Internal Medicine

## 2014-10-14 VITALS — BP 108/74 | HR 54 | Ht 71.0 in | Wt 313.0 lb

## 2014-10-14 DIAGNOSIS — I10 Essential (primary) hypertension: Secondary | ICD-10-CM | POA: Diagnosis not present

## 2014-10-14 DIAGNOSIS — I481 Persistent atrial fibrillation: Secondary | ICD-10-CM | POA: Diagnosis not present

## 2014-10-14 DIAGNOSIS — I4819 Other persistent atrial fibrillation: Secondary | ICD-10-CM

## 2014-10-14 MED ORDER — METOPROLOL TARTRATE 50 MG PO TABS: 25.0000 mg | ORAL_TABLET | Freq: Two times a day (BID) | ORAL | Status: DC

## 2014-10-14 NOTE — Patient Instructions (Signed)
Medication Instructions:  Your physician has recommended you make the following change in your medication:  1) Decrease Metoprolol to 25 mg twice daily   Labwork: None ordered  Testing/Procedures: None ordered  Follow-Up: Your physician wants you to follow-up in: 6 months with Dr [name] Dennis Bast will receive a reminder letter in the mail two months in advance. If you don't receive a letter, please call our office to schedule the follow-up appointment.   Any Other Special Instructions Will Be Listed Below (If Applicable).
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
## 2014-10-15 ENCOUNTER — Other Ambulatory Visit: Payer: Self-pay | Admitting: *Deleted

## 2014-10-16 ENCOUNTER — Encounter: Payer: Self-pay | Admitting: Endocrinology

## 2014-10-17 ENCOUNTER — Other Ambulatory Visit: Payer: Self-pay | Admitting: *Deleted

## 2014-10-17 DIAGNOSIS — E119 Type 2 diabetes mellitus without complications: Secondary | ICD-10-CM

## 2014-10-20 ENCOUNTER — Other Ambulatory Visit: Payer: Self-pay | Admitting: Internal Medicine

## 2014-10-24 ENCOUNTER — Other Ambulatory Visit: Payer: Self-pay | Admitting: *Deleted

## 2014-10-24 MED ORDER — METOPROLOL TARTRATE 25 MG PO TABS: 25.0000 mg | ORAL_TABLET | Freq: Two times a day (BID) | ORAL | Status: DC

## 2014-10-24 NOTE — Telephone Encounter (Signed)
Received fax from [facility] in [place] stating that the patient would like an rx for the 25mg  metoprolol tablets so that he doesn't have to split them.

## 2014-10-29 ENCOUNTER — Other Ambulatory Visit: Payer: Self-pay | Admitting: Endocrinology

## 2014-11-05 ENCOUNTER — Other Ambulatory Visit: Payer: Self-pay | Admitting: Endocrinology

## 2014-11-06 ENCOUNTER — Other Ambulatory Visit: Payer: Self-pay | Admitting: Endocrinology

## 2014-11-07 ENCOUNTER — Other Ambulatory Visit: Payer: Self-pay | Admitting: Internal Medicine

## 2014-11-12 ENCOUNTER — Other Ambulatory Visit (INDEPENDENT_AMBULATORY_CARE_PROVIDER_SITE_OTHER): Payer: BLUE CROSS/BLUE SHIELD

## 2014-11-12 DIAGNOSIS — E119 Type 2 diabetes mellitus without complications: Secondary | ICD-10-CM

## 2014-11-12 LAB — MICROALBUMIN / CREATININE URINE RATIO
Creatinine,U: 22.5 mg/dL
Microalb Creat Ratio: 3.1 mg/g (ref 0.0–30.0)
Microalb, Ur: <0.7 mg/dL (ref 0.0–1.9)

## 2014-11-12 LAB — URINALYSIS
Bilirubin Urine: NEGATIVE
Hgb urine dipstick: NEGATIVE
Ketones, ur: NEGATIVE
Leukocytes, UA: NEGATIVE
Nitrite: NEGATIVE
Specific Gravity, Urine: <=1.005 — AB (ref 1.000–1.030)
Total Protein, Urine: NEGATIVE
Urine Glucose: >=1000 — AB
Urobilinogen, UA: 0.2 (ref 0.0–1.0)
pH: 5.5 (ref 5.0–8.0)

## 2014-11-12 LAB — COMPREHENSIVE METABOLIC PANEL
ALT: 28 U/L (ref 0–53)
AST: 23 U/L (ref 0–37)
Albumin: 4.4 g/dL (ref 3.5–5.2)
Alkaline Phosphatase: 57 U/L (ref 39–117)
BUN: 18 mg/dL (ref 6–23)
CO2: 25 mEq/L (ref 19–32)
Calcium: 10.4 mg/dL (ref 8.4–10.5)
Chloride: 99 mEq/L (ref 96–112)
Creatinine, Ser: 0.96 mg/dL (ref 0.40–1.50)
GFR: 83.84 mL/min (ref >60.00)
Glucose, Bld: 189 mg/dL — ABNORMAL HIGH (ref 70–99)
Potassium: 4 mEq/L (ref 3.5–5.1)
Sodium: 135 mEq/L (ref 135–145)
Total Bilirubin: 0.4 mg/dL (ref 0.2–1.2)
Total Protein: 8.5 g/dL — ABNORMAL HIGH (ref 6.0–8.3)

## 2014-11-12 LAB — HEMOGLOBIN A1C: Hgb A1c MFr Bld: 6.4 % (ref 4.6–6.5)

## 2014-11-17 ENCOUNTER — Other Ambulatory Visit: Payer: Self-pay | Admitting: Endocrinology

## 2014-11-18 ENCOUNTER — Ambulatory Visit (INDEPENDENT_AMBULATORY_CARE_PROVIDER_SITE_OTHER): Payer: BLUE CROSS/BLUE SHIELD | Admitting: Endocrinology

## 2014-11-18 VITALS — BP 140/83 | HR 57 | Temp 98.5°F | Resp 16 | Ht 71.0 in | Wt 313.0 lb

## 2014-11-18 DIAGNOSIS — E8809 Other disorders of plasma-protein metabolism, not elsewhere classified: Secondary | ICD-10-CM

## 2014-11-18 DIAGNOSIS — Z794 Long term (current) use of insulin: Secondary | ICD-10-CM

## 2014-11-18 DIAGNOSIS — E1165 Type 2 diabetes mellitus with hyperglycemia: Secondary | ICD-10-CM
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
## 2014-11-18 NOTE — Patient Instructions (Signed)
Check blood sugars on waking up 4  times a week Also check blood sugars about 2 hours after a meal and do this after different meals by rotation  Recommended blood sugar levels on waking up is 90-130 and about 2 hours after meal is 130-160  Please bring your blood sugar monitor to each visit, thank you  Metformin 1 at dinner and 1 at bedtime

## 2014-11-19 ENCOUNTER — Other Ambulatory Visit: Payer: Self-pay | Admitting: *Deleted

## 2014-11-19 MED ORDER — LIRAGLUTIDE 18 MG/3ML ~~LOC~~ SOPN: 1.8000 mg | PEN_INJECTOR | Freq: Every day | SUBCUTANEOUS | Status: DC

## 2014-11-22 LAB — VITAMIN D 1,25 DIHYDROXY
Vitamin D 1, 25 (OH)2 Total: 33 pg/mL
Vitamin D2 1, 25 (OH)2: <10 pg/mL
Vitamin D3 1, 25 (OH)2: 33 pg/mL

## 2014-11-22 LAB — PROTEIN ELECTROPHORESIS, SERUM
A/G Ratio: 1 (ref 0.7–1.7)
Albumin ELP: 3.9 g/dL (ref 2.9–4.4)
Alpha 1: 0.2 g/dL (ref 0.0–0.4)
Alpha 2: 0.9 g/dL (ref 0.4–1.0)
Beta: 1.2 g/dL (ref 0.7–1.3)
Gamma Globulin: 1.5 g/dL (ref 0.4–1.8)
Globulin, Total: 3.8 g/dL (ref 2.2–3.9)
Total Protein: 7.7 g/dL (ref 6.0–8.5)

## 2014-11-28 LAB — HM DIABETES EYE EXAM

## 2014-11-29 ENCOUNTER — Encounter: Payer: Self-pay | Admitting: *Deleted

## 2014-12-30 ENCOUNTER — Other Ambulatory Visit: Payer: Self-pay | Admitting: Internal Medicine

## 2014-12-31 ENCOUNTER — Encounter: Payer: Self-pay | Admitting: Endocrinology

## 2015-01-01 ENCOUNTER — Other Ambulatory Visit: Payer: Self-pay | Admitting: *Deleted

## 2015-01-01 MED ORDER — INSULIN PEN NEEDLE 31G X 5 MM MISC: Status: DC

## 2015-01-14 ENCOUNTER — Other Ambulatory Visit: Payer: Self-pay | Admitting: Endocrinology

## 2015-01-15 ENCOUNTER — Encounter: Payer: Self-pay | Admitting: Endocrinology

## 2015-01-16 ENCOUNTER — Other Ambulatory Visit: Payer: Self-pay | Admitting: *Deleted

## 2015-01-16 MED ORDER — ONETOUCH DELICA LANCETS FINE MISC: Status: DC

## 2015-02-11 ENCOUNTER — Other Ambulatory Visit: Payer: Self-pay | Admitting: *Deleted

## 2015-02-11 MED ORDER — INSULIN ASPART PROT & ASPART (70-30 MIX) 100 UNIT/ML PEN: 12.0000 [IU] | PEN_INJECTOR | Freq: Two times a day (BID) | SUBCUTANEOUS | Status: DC

## 2015-02-12 ENCOUNTER — Encounter: Payer: Self-pay | Admitting: Endocrinology

## 2015-02-14 ENCOUNTER — Other Ambulatory Visit: Payer: Self-pay | Admitting: *Deleted

## 2015-02-14 ENCOUNTER — Other Ambulatory Visit (INDEPENDENT_AMBULATORY_CARE_PROVIDER_SITE_OTHER): Payer: 59

## 2015-02-14 DIAGNOSIS — E1165 Type 2 diabetes mellitus with hyperglycemia: Secondary | ICD-10-CM

## 2015-02-14 DIAGNOSIS — E8809 Other disorders of plasma-protein metabolism, not elsewhere classified: Secondary | ICD-10-CM | POA: Diagnosis not present

## 2015-02-14 DIAGNOSIS — Z794 Long term (current) use of insulin: Secondary | ICD-10-CM | POA: Diagnosis not present

## 2015-02-14 LAB — COMPREHENSIVE METABOLIC PANEL
ALT: 33 U/L (ref 0–53)
AST: 22 U/L (ref 0–37)
Albumin: 4.2 g/dL (ref 3.5–5.2)
Alkaline Phosphatase: 53 U/L (ref 39–117)
BUN: 18 mg/dL (ref 6–23)
CO2: 22 mEq/L (ref 19–32)
Calcium: 9.5 mg/dL (ref 8.4–10.5)
Chloride: 104 mEq/L (ref 96–112)
Creatinine, Ser: 0.83 mg/dL (ref 0.40–1.50)
GFR: 99.08 mL/min (ref >60.00)
Glucose, Bld: 157 mg/dL — ABNORMAL HIGH (ref 70–99)
Potassium: 3.8 mEq/L (ref 3.5–5.1)
Sodium: 137 mEq/L (ref 135–145)
Total Bilirubin: 0.4 mg/dL (ref 0.2–1.2)
Total Protein: 7.9 g/dL (ref 6.0–8.3)

## 2015-02-14 LAB — LIPID PANEL
Cholesterol: 149 mg/dL (ref 0–200)
HDL: 32.1 mg/dL — ABNORMAL LOW (ref >39.00)
LDL Cholesterol: 79 mg/dL (ref 0–99)
NonHDL: 117.31
Total CHOL/HDL Ratio: 5
Triglycerides: 194 mg/dL — ABNORMAL HIGH (ref 0.0–149.0)
VLDL: 38.8 mg/dL (ref 0.0–40.0)

## 2015-02-14 LAB — HEMOGLOBIN A1C: Hgb A1c MFr Bld: 6.6 % — ABNORMAL HIGH (ref 4.6–6.5)

## 2015-02-14 LAB — VITAMIN D 25 HYDROXY (VIT D DEFICIENCY, FRACTURES): VITD: 22.87 ng/mL — ABNORMAL LOW (ref 30.00–100.00)

## 2015-02-14 MED ORDER — EMPAGLIFLOZIN 25 MG PO TABS: 25.0000 mg | ORAL_TABLET | Freq: Every day | ORAL | Status: DC

## 2015-02-15 LAB — PLEASE NOTE

## 2015-02-15 LAB — PARATHYROID HORMONE, INTACT (NO CA): PTH: 24 pg/mL (ref 15–65)

## 2015-02-17 ENCOUNTER — Other Ambulatory Visit: Payer: Self-pay | Admitting: Internal Medicine

## 2015-02-19 ENCOUNTER — Ambulatory Visit (INDEPENDENT_AMBULATORY_CARE_PROVIDER_SITE_OTHER): Payer: 59 | Admitting: Endocrinology

## 2015-02-19 VITALS — BP 124/84 | HR 71 | Temp 98.2°F | Resp 16 | Ht 71.0 in | Wt 320.8 lb

## 2015-02-19 DIAGNOSIS — E1165 Type 2 diabetes mellitus with hyperglycemia: Secondary | ICD-10-CM | POA: Diagnosis not present

## 2015-02-19 DIAGNOSIS — Z794 Long term (current) use of insulin: Secondary | ICD-10-CM

## 2015-02-19 DIAGNOSIS — Z23 Encounter for immunization: Secondary | ICD-10-CM

## 2015-02-19 DIAGNOSIS — E782 Mixed hyperlipidemia: Secondary | ICD-10-CM | POA: Diagnosis not present

## 2015-02-19 NOTE — Patient Instructions (Addendum)
18 units before Bfst and 22 before supper  Water exercise 3-4x per week  Better diet  Check blood sugars on waking up 3-4  times a week Also check blood sugars about 2 hours after a meal and do this after different meals by rotation  Recommended blood sugar levels on waking up is 90-130 and about 2 hours after meal is 130-160  Please bring your blood sugar monitor to each visit, thank you  Vitamin D 3, 2000 units 1x daily
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
## 2015-03-03 DIAGNOSIS — Z7689 Persons encountering health services in other specified circumstances: Secondary | ICD-10-CM

## 2015-03-10 ENCOUNTER — Other Ambulatory Visit: Payer: Self-pay | Admitting: Internal Medicine

## 2015-03-10 MED ORDER — METOPROLOL TARTRATE 25 MG PO TABS: 25.0000 mg | ORAL_TABLET | Freq: Two times a day (BID) | ORAL | Status: DC

## 2015-03-10 MED ORDER — FUROSEMIDE 40 MG PO TABS: ORAL_TABLET | ORAL | Status: DC

## 2015-03-10 MED ORDER — POTASSIUM CHLORIDE CRYS ER 20 MEQ PO TBCR: 20.0000 meq | EXTENDED_RELEASE_TABLET | Freq: Two times a day (BID) | ORAL | Status: DC

## 2015-03-13 ENCOUNTER — Encounter: Payer: Self-pay | Admitting: Internal Medicine

## 2015-04-03 ENCOUNTER — Ambulatory Visit: Payer: Private Health Insurance - Indemnity | Admitting: Endocrinology

## 2015-04-09 ENCOUNTER — Other Ambulatory Visit: Payer: Self-pay | Admitting: *Deleted

## 2015-04-09 ENCOUNTER — Encounter: Payer: Self-pay | Admitting: Endocrinology

## 2015-04-09 ENCOUNTER — Ambulatory Visit (INDEPENDENT_AMBULATORY_CARE_PROVIDER_SITE_OTHER): Payer: Private Health Insurance - Indemnity | Admitting: Endocrinology

## 2015-04-09 VITALS — BP 126/82 | HR 78 | Temp 98.0°F | Resp 16 | Ht 71.0 in | Wt 326.6 lb

## 2015-04-09 DIAGNOSIS — E1165 Type 2 diabetes mellitus with hyperglycemia: Secondary | ICD-10-CM

## 2015-04-09 DIAGNOSIS — Z794 Long term (current) use of insulin: Secondary | ICD-10-CM | POA: Diagnosis not present

## 2015-04-09 MED ORDER — INSULIN DEGLUDEC 100 UNIT/ML ~~LOC~~ SOPN: 30.0000 [IU] | PEN_INJECTOR | Freq: Every day | SUBCUTANEOUS | Status: DC

## 2015-04-09 MED ORDER — INSULIN PEN NEEDLE 31G X 5 MM MISC: Status: DC

## 2015-04-09 MED ORDER — INSULIN ASPART 100 UNIT/ML FLEXPEN: PEN_INJECTOR | SUBCUTANEOUS | Status: DC

## 2015-04-09 NOTE — Patient Instructions (Addendum)
STOP HUMALOG MIX  Tresiba insulin: This insulin provides blood sugar control for up to 24 hours.   Start with 24 units daily and increase by 2 units every 3 days until the waking up sugars are under 140.   Then continue the same dose. If blood sugar is under 90 for 2 days in a row, reduce the dose by 2 units.   Note that this insulin does not control the rise of blood sugar with meals    Humalog clear start with 6 units at Bfst and lunch and 8 units at supper and keep 2 hr reading <200  Check blood sugars on waking up 3  times a week Also check blood sugars about 2 hours after a meal and do this after different meals by rotation  Recommended blood sugar levels on waking up is 90-130 and about 2 hours after meal is 130-160  Please bring your blood sugar monitor to each visit, thank you
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
## 2015-04-10 ENCOUNTER — Encounter: Payer: Self-pay | Admitting: Internal Medicine

## 2015-04-11 ENCOUNTER — Telehealth: Payer: Self-pay | Admitting: Internal Medicine

## 2015-04-11 NOTE — Telephone Encounter (Signed)
VM left for patient Insurance Disability paper ready for pick up.

## 2015-04-30 ENCOUNTER — Ambulatory Visit: Payer: Private Health Insurance - Indemnity | Admitting: Endocrinology

## 2015-04-30 ENCOUNTER — Ambulatory Visit: Payer: Private Health Insurance - Indemnity | Admitting: Internal Medicine

## 2015-05-12 ENCOUNTER — Other Ambulatory Visit: Payer: Self-pay | Admitting: Endocrinology

## 2015-05-12 ENCOUNTER — Other Ambulatory Visit: Payer: Self-pay | Admitting: Internal Medicine

## 2015-05-13 ENCOUNTER — Other Ambulatory Visit: Payer: Self-pay | Admitting: Endocrinology

## 2015-05-16 ENCOUNTER — Other Ambulatory Visit: Payer: Self-pay | Admitting: Endocrinology

## 2015-05-19 ENCOUNTER — Encounter: Payer: Self-pay | Admitting: Endocrinology

## 2015-05-19 ENCOUNTER — Ambulatory Visit (INDEPENDENT_AMBULATORY_CARE_PROVIDER_SITE_OTHER): Payer: Private Health Insurance - Indemnity | Admitting: Endocrinology

## 2015-05-19 VITALS — BP 128/78 | HR 73 | Temp 97.1°F | Resp 14 | Ht 71.0 in | Wt 328.8 lb

## 2015-05-19 DIAGNOSIS — E1165 Type 2 diabetes mellitus with hyperglycemia: Secondary | ICD-10-CM | POA: Diagnosis not present

## 2015-05-19 DIAGNOSIS — Z794 Long term (current) use of insulin: Secondary | ICD-10-CM | POA: Diagnosis not present

## 2015-05-19 NOTE — Patient Instructions (Signed)
Lawrence Jordan 46units   More sugars after meaal
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
## 2015-05-20 ENCOUNTER — Other Ambulatory Visit: Payer: Self-pay | Admitting: Orthopedic Surgery

## 2015-05-20 DIAGNOSIS — M25572 Pain in left ankle and joints of left foot: Secondary | ICD-10-CM

## 2015-05-20 LAB — BASIC METABOLIC PANEL
BUN: 17 mg/dL (ref 6–23)
CO2: 27 mEq/L (ref 19–32)
Calcium: 9.9 mg/dL (ref 8.4–10.5)
Chloride: 101 mEq/L (ref 96–112)
Creatinine, Ser: 1 mg/dL (ref 0.40–1.50)
GFR: 79.85 mL/min (ref >60.00)
Glucose, Bld: 134 mg/dL — ABNORMAL HIGH (ref 70–99)
Potassium: 4.3 mEq/L (ref 3.5–5.1)
Sodium: 136 mEq/L (ref 135–145)

## 2015-05-20 LAB — FRUCTOSAMINE: Fructosamine: 265 umol/L (ref 0–285)

## 2015-05-22 ENCOUNTER — Encounter: Payer: 59 | Attending: Endocrinology | Admitting: Dietician

## 2015-05-22 ENCOUNTER — Encounter: Payer: Self-pay | Admitting: Dietician

## 2015-05-22 VITALS — Ht 71.0 in | Wt 327.0 lb

## 2015-05-22 DIAGNOSIS — E118 Type 2 diabetes mellitus with unspecified complications: Secondary | ICD-10-CM

## 2015-05-22 DIAGNOSIS — IMO0002 Reserved for concepts with insufficient information to code with codable children: Secondary | ICD-10-CM

## 2015-05-22 DIAGNOSIS — Z794 Long term (current) use of insulin: Secondary | ICD-10-CM | POA: Diagnosis present

## 2015-05-22 DIAGNOSIS — E1165 Type 2 diabetes mellitus with hyperglycemia: Secondary | ICD-10-CM | POA: Diagnosis present

## 2015-05-22 NOTE — Patient Instructions (Signed)
Be as active as possible most days of the week. Eat mindfully, healthy choices, continue to eat slowly, stop when you are full. Half of your plate should be non-starchy vegetables. Small snack with protein when you are hungry. Before a snack ask, "Am I hungry or eating for another reason?" For snacks.  Put a small amount in a bowl and sit down to eat. Consider Calorie [employer] Pace app to make healthier food choices when eating out.

## 2015-05-22 NOTE — Progress Notes (Signed)
Medical Nutrition Therapy:  Appt start time: 1110 end time:  1220.   Assessment:  Primary concerns today: Patient is here today with his wife.  He would like to know more about a low carbohydrate diet for weight loss.  He has been referred for type 2 diabetes with BMI of 46%.  He has had diabetes since 2009, hyperlipidemia, and HTN.  Labs include:  GFR 80, triglycerides 194, HDL 32, Fructosamine 265 05/19/15, and HgbA1C of 6.6% 02/14/15.  His Vitamin D is also low at 22.9 and he is now on supplementation.    Weight hx: Highest 342 lbs Lowest 178 lbs age 65 Today:  327 lbs.  Had been 20-30 lbs less about 1 year ago. He lost 100 lbs on the [place]'s diet several years ago.  Patient lives with his wife.  She does the shopping and cooking.  He is retired as a Administrator, sports.  A dog bite 6 months ago damaged his achilles tendon which he needs repaired.  Pain has interfered with exercise.  His daughter is a Microbiologist.  Preferred Learning Style:   No preference indicated   Learning Readiness:   Contemplating  MEDICATIONS: see list to include:  Jardiance, Novolog (6 units before breakfast and lunch and 8 units before dinner), Tresiba, Metformin.   DIETARY INTAKE: Follows a low sodium diet at times often but gets tired of cooking all of the time.  Increased portion size. Usual eating pattern includes 3 meals and 2 snacks per day.  Avoided foods include added salt.    24-hr recall:  B (10 AM): regular oatmeal with berries, with 1 1/2 Tablespoons butter OR eggs bacon or sausage, 2 slices white bread Snk ( AM): none  L (3 PM): leftovers or [employer] OR ham sandwich or salad or burger  Snk ( PM): popcorn or nuts or chips D (7 PM): pork chops potatoes, carrots celery steak baked potato salad OR pinto beans and cornbread OR salmon with slaw and asparagus or other vegetable. Snk ( PM): "downfall", cookies or ice cream or SF pudding Beverages: decaf coffee with LF cream and splenda,  sugar free peach tea, rare diet coke, 1% milk (12 ounces daily)  Usual physical activity: none or walks on the beach.  They have an outdoor pool and has considered using the pool.  Stairs in the home.  Estimated energy needs: 1800 calories 200 g carbohydrates 113 g protein 60 g fat  Progress Towards Goal(s):  In progress.   Nutritional Diagnosis:  NB-1.1 Food and nutrition-related knowledge deficit As related to balance of carbohydrate, protein, and fat.  As evidenced by diet hx and patient report.    Intervention:  Nutrition counseling/education related type 2 diabetes and weight loss.  Discussed portion control and showed food portions.  Discussed benefits of exercise and need for consistent exercise.  Discussed mindful eating.  Be as active as possible most days of the week. Eat mindfully, healthy choices, continue to eat slowly, stop when you are full. Half of your plate should be non-starchy vegetables. Small snack with protein when you are hungry. Before a snack ask, "Am I hungry or eating for another reason?" For snacks.  Put a small amount in a bowl and sit down to eat. Consider Calorie [employer] Pace app to make healthier food choices when eating out.  Teaching Method Utilized:  Visual Auditory Hands on  Handouts given during visit include:  Meal plan card  My plate  Label reading  Snack list  Barriers to learning/adherence to lifestyle change: none  Demonstrated degree of understanding via:  Teach Back   Monitoring/Evaluation:  Dietary intake, exercise, label reading, and body weight prn.

## 2015-05-24 ENCOUNTER — Ambulatory Visit
Admission: RE | Admit: 2015-05-24 | Discharge: 2015-05-24 | Disposition: A | Discharge status: Home / Self Care | Payer: Private Health Insurance - Indemnity | Source: Ambulatory Visit | Attending: Orthopedic Surgery | Admitting: Orthopedic Surgery

## 2015-05-24 DIAGNOSIS — M25572 Pain in left ankle and joints of left foot: Secondary | ICD-10-CM

## 2015-05-27 ENCOUNTER — Encounter: Payer: Self-pay | Admitting: Internal Medicine

## 2015-06-06 ENCOUNTER — Other Ambulatory Visit: Payer: Self-pay | Admitting: Internal Medicine

## 2015-06-18 ENCOUNTER — Encounter: Payer: Self-pay | Admitting: Endocrinology

## 2015-06-18 ENCOUNTER — Ambulatory Visit (INDEPENDENT_AMBULATORY_CARE_PROVIDER_SITE_OTHER): Payer: 59 | Admitting: Internal Medicine

## 2015-06-18 ENCOUNTER — Ambulatory Visit (INDEPENDENT_AMBULATORY_CARE_PROVIDER_SITE_OTHER): Payer: 59 | Admitting: Endocrinology

## 2015-06-18 ENCOUNTER — Encounter: Payer: Self-pay | Admitting: Internal Medicine

## 2015-06-18 VITALS — BP 118/76 | HR 84 | Temp 97.9°F | Resp 16 | Ht 71.0 in | Wt 320.2 lb

## 2015-06-18 VITALS — BP 118/80 | HR 61 | Ht 71.0 in | Wt 318.1 lb

## 2015-06-18 DIAGNOSIS — I10 Essential (primary) hypertension: Secondary | ICD-10-CM | POA: Diagnosis not present

## 2015-06-18 DIAGNOSIS — I4819 Other persistent atrial fibrillation: Secondary | ICD-10-CM

## 2015-06-18 DIAGNOSIS — E119 Type 2 diabetes mellitus without complications: Secondary | ICD-10-CM | POA: Diagnosis not present

## 2015-06-18 DIAGNOSIS — I481 Persistent atrial fibrillation: Secondary | ICD-10-CM

## 2015-06-18 LAB — POCT GLYCOSYLATED HEMOGLOBIN (HGB A1C): Hemoglobin A1C: 6.4

## 2015-06-18 MED ORDER — FUROSEMIDE 40 MG PO TABS: 20.0000 mg | ORAL_TABLET | Freq: Every day | ORAL | Status: DC

## 2015-06-18 MED ORDER — POTASSIUM CHLORIDE CRYS ER 20 MEQ PO TBCR: 20.0000 meq | EXTENDED_RELEASE_TABLET | Freq: Two times a day (BID) | ORAL | Status: DC

## 2015-06-18 MED ORDER — METOPROLOL TARTRATE 25 MG PO TABS: 25.0000 mg | ORAL_TABLET | Freq: Two times a day (BID) | ORAL | Status: DC
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
## 2015-06-18 NOTE — Patient Instructions (Signed)
Medication Instructions:  Your physician has recommended you make the following change in your medication:  1) Decrease Metoprolol to 12.5 mg twice daily for 4 weeks then stop    Labwork: None ordered   Testing/Procedures: None ordered   Follow-Up:  Your physician wants you to follow-up in: 6 months with Dr [name] Dennis Bast will receive a reminder letter in the mail two months in advance. If you don't receive a letter, please call our office to schedule the follow-up appointment.   Any Other Special Instructions Will Be Listed Below (If Applicable).     If you need a refill on your cardiac medications before your next appointment, please call your pharmacy.
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
## 2015-06-18 NOTE — Patient Instructions (Signed)
Reduce Novolog 2 units

## 2015-06-19 ENCOUNTER — Other Ambulatory Visit: Payer: Self-pay | Admitting: Endocrinology

## 2015-07-07 ENCOUNTER — Other Ambulatory Visit: Payer: Self-pay

## 2015-07-07 MED ORDER — PANTOPRAZOLE SODIUM 40 MG PO TBEC: 40.0000 mg | DELAYED_RELEASE_TABLET | Freq: Every day | ORAL | Status: DC

## 2015-07-08 MED ORDER — INSULIN ASPART 100 UNIT/ML FLEXPEN: PEN_INJECTOR | SUBCUTANEOUS | Status: DC

## 2015-07-10 ENCOUNTER — Other Ambulatory Visit: Payer: Self-pay

## 2015-07-10 MED ORDER — INSULIN GLARGINE 300 UNIT/ML ~~LOC~~ SOPN
30.0000 [IU] | PEN_INJECTOR | Freq: Every day | SUBCUTANEOUS | Status: DC
Start: 2015-07-10 — End: 2015-12-23

## 2015-07-12 ENCOUNTER — Other Ambulatory Visit: Payer: Self-pay | Admitting: Endocrinology

## 2015-07-15 ENCOUNTER — Other Ambulatory Visit: Payer: Self-pay

## 2015-07-17 ENCOUNTER — Other Ambulatory Visit: Payer: Self-pay

## 2015-07-17 MED ORDER — INSULIN GLARGINE 300 UNIT/ML ~~LOC~~ SOPN: 30.0000 [IU] | PEN_INJECTOR | Freq: Every day | SUBCUTANEOUS | Status: DC

## 2015-07-18 ENCOUNTER — Encounter: Payer: Self-pay | Admitting: Endocrinology

## 2015-07-31 ENCOUNTER — Encounter: Payer: Self-pay | Admitting: Endocrinology

## 2015-08-01 NOTE — Telephone Encounter (Signed)
I contacted the pt and spoke with him on his cell phone. I advised the pt "mixing insulin" is in reference to mixing two different types of insulin and injecting at the same time. Pt verbalized understanding on this.

## 2015-08-09 ENCOUNTER — Other Ambulatory Visit: Payer: Self-pay | Admitting: Endocrinology

## 2015-08-10 ENCOUNTER — Other Ambulatory Visit: Payer: Self-pay | Admitting: Endocrinology

## 2015-08-11 ENCOUNTER — Other Ambulatory Visit: Payer: Self-pay | Admitting: Endocrinology

## 2015-08-26 ENCOUNTER — Encounter: Payer: Self-pay | Admitting: Endocrinology

## 2015-08-27 ENCOUNTER — Encounter: Payer: Self-pay | Admitting: Internal Medicine

## 2015-08-27 DIAGNOSIS — E119 Type 2 diabetes mellitus without complications: Secondary | ICD-10-CM

## 2015-08-28 MED ORDER — METOPROLOL TARTRATE 25 MG PO TABS: ORAL_TABLET | ORAL | 1 refills | Status: DC

## 2015-08-28 NOTE — Telephone Encounter (Signed)
Pt called into clinic stating yesterday he had at least 5 hours of afib. HR ranged from 60-160s. He did not take any medications other than his normal routine medications. He was taken off metoprolol earlier this year by Dr. [name]. Instructed pt if HR >100 with afib he can take dose of metoprolol 25mg  and repeat in 6 hours if afib still present.  Patient requested new RX since his is expired. Also instructed pt to call for appointment if afib occurrences increase. Pt verbalized understanding.

## 2015-09-07 ENCOUNTER — Other Ambulatory Visit: Payer: Self-pay | Admitting: Internal Medicine

## 2015-09-09 ENCOUNTER — Other Ambulatory Visit: Payer: Self-pay | Admitting: *Deleted

## 2015-09-09 MED ORDER — APIXABAN 5 MG PO TABS: ORAL_TABLET | ORAL | 2 refills | Status: DC

## 2015-09-13 ENCOUNTER — Other Ambulatory Visit: Payer: Self-pay | Admitting: Internal Medicine

## 2015-09-18 ENCOUNTER — Ambulatory Visit (INDEPENDENT_AMBULATORY_CARE_PROVIDER_SITE_OTHER): Payer: BLUE CROSS/BLUE SHIELD | Admitting: Endocrinology

## 2015-09-18 ENCOUNTER — Encounter: Payer: Self-pay | Admitting: Endocrinology

## 2015-09-18 VITALS — BP 124/78 | HR 61 | Ht 71.0 in | Wt 322.0 lb

## 2015-09-18 DIAGNOSIS — E559 Vitamin D deficiency, unspecified: Secondary | ICD-10-CM | POA: Diagnosis not present

## 2015-09-18 DIAGNOSIS — Z794 Long term (current) use of insulin: Secondary | ICD-10-CM

## 2015-09-18 DIAGNOSIS — E782 Mixed hyperlipidemia: Secondary | ICD-10-CM | POA: Diagnosis not present

## 2015-09-18 DIAGNOSIS — E1165 Type 2 diabetes mellitus with hyperglycemia: Secondary | ICD-10-CM | POA: Diagnosis not present

## 2015-09-18 LAB — POCT GLYCOSYLATED HEMOGLOBIN (HGB A1C): Hemoglobin A1C: 6.7

## 2015-09-18 NOTE — Addendum Note (Signed)
Addended by: Nile Riggs on: 09/18/2015 02:28 PM   Modules accepted: Orders

## 2015-09-18 NOTE — Patient Instructions (Addendum)
Check blood sugars on waking up  Every 2 days  Also check blood sugars about 2 hours after a meal and do this after different meals by rotation  Recommended blood sugar levels on waking up is 90-130 and about 2 hours after meal is 130-160  Please bring your blood sugar monitor to each visit, thank you  Toujeo at least 48 units   Good diet
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
## 2015-09-20 ENCOUNTER — Encounter: Payer: Self-pay | Admitting: Endocrinology

## 2015-09-23 ENCOUNTER — Other Ambulatory Visit (INDEPENDENT_AMBULATORY_CARE_PROVIDER_SITE_OTHER): Payer: BLUE CROSS/BLUE SHIELD

## 2015-09-23 DIAGNOSIS — E782 Mixed hyperlipidemia: Secondary | ICD-10-CM

## 2015-09-23 DIAGNOSIS — E119 Type 2 diabetes mellitus without complications: Secondary | ICD-10-CM | POA: Diagnosis not present

## 2015-09-23 DIAGNOSIS — E559 Vitamin D deficiency, unspecified: Secondary | ICD-10-CM

## 2015-09-23 LAB — COMPREHENSIVE METABOLIC PANEL
ALT: 31 U/L (ref 0–53)
AST: 21 U/L (ref 0–37)
Albumin: 4.3 g/dL (ref 3.5–5.2)
Alkaline Phosphatase: 51 U/L (ref 39–117)
BUN: 17 mg/dL (ref 6–23)
CO2: 27 mEq/L (ref 19–32)
Calcium: 9.4 mg/dL (ref 8.4–10.5)
Chloride: 103 mEq/L (ref 96–112)
Creatinine, Ser: 0.87 mg/dL (ref 0.40–1.50)
GFR: 93.67 mL/min (ref >60.00)
Glucose, Bld: 122 mg/dL — ABNORMAL HIGH (ref 70–99)
Potassium: 4.1 mEq/L (ref 3.5–5.1)
Sodium: 138 mEq/L (ref 135–145)
Total Bilirubin: 0.4 mg/dL (ref 0.2–1.2)
Total Protein: 7.5 g/dL (ref 6.0–8.3)

## 2015-09-23 LAB — LIPID PANEL
Cholesterol: 153 mg/dL (ref 0–200)
HDL: 41.3 mg/dL (ref >39.00)
LDL Cholesterol: 89 mg/dL (ref 0–99)
NonHDL: 111.57
Total CHOL/HDL Ratio: 4
Triglycerides: 115 mg/dL (ref 0.0–149.0)
VLDL: 23 mg/dL (ref 0.0–40.0)

## 2015-09-23 LAB — MICROALBUMIN / CREATININE URINE RATIO
Creatinine,U: 89.6 mg/dL
Microalb Creat Ratio: 4.7 mg/g (ref 0.0–30.0)
Microalb, Ur: 4.2 mg/dL — ABNORMAL HIGH (ref 0.0–1.9)

## 2015-09-23 LAB — VITAMIN D 25 HYDROXY (VIT D DEFICIENCY, FRACTURES): VITD: 29.07 ng/mL — ABNORMAL LOW (ref 30.00–100.00)

## 2015-09-26 ENCOUNTER — Other Ambulatory Visit: Payer: Self-pay

## 2015-09-26 DIAGNOSIS — E118 Type 2 diabetes mellitus with unspecified complications: Secondary | ICD-10-CM

## 2015-09-26 MED ORDER — INSULIN GLARGINE 300 UNIT/ML ~~LOC~~ SOPN: 48.0000 [IU] | PEN_INJECTOR | Freq: Every day | SUBCUTANEOUS | 3 refills | Status: DC

## 2015-11-13 ENCOUNTER — Ambulatory Visit (HOSPITAL_COMMUNITY)
Admission: RE | Admit: 2015-11-13 | Discharge: 2015-11-13 | Disposition: A | Discharge status: Home / Self Care | Payer: BLUE CROSS/BLUE SHIELD | Source: Ambulatory Visit | Attending: Nurse Practitioner | Admitting: Nurse Practitioner

## 2015-11-13 ENCOUNTER — Encounter (HOSPITAL_COMMUNITY): Payer: Self-pay | Admitting: Nurse Practitioner

## 2015-11-13 ENCOUNTER — Telehealth: Payer: Self-pay | Admitting: Internal Medicine

## 2015-11-13 VITALS — BP 110/74 | HR 141 | Ht 71.0 in | Wt 329.0 lb

## 2015-11-13 DIAGNOSIS — I484 Atypical atrial flutter: Secondary | ICD-10-CM

## 2015-11-13 DIAGNOSIS — Z794 Long term (current) use of insulin: Secondary | ICD-10-CM | POA: Insufficient documentation

## 2015-11-13 DIAGNOSIS — Z823 Family history of stroke: Secondary | ICD-10-CM | POA: Insufficient documentation

## 2015-11-13 DIAGNOSIS — Z96641 Presence of right artificial hip joint: Secondary | ICD-10-CM | POA: Diagnosis not present

## 2015-11-13 DIAGNOSIS — Z888 Allergy status to other drugs, medicaments and biological substances status: Secondary | ICD-10-CM | POA: Diagnosis not present

## 2015-11-13 DIAGNOSIS — Z9889 Other specified postprocedural states: Secondary | ICD-10-CM | POA: Diagnosis not present

## 2015-11-13 DIAGNOSIS — E7800 Pure hypercholesterolemia, unspecified: Secondary | ICD-10-CM | POA: Insufficient documentation

## 2015-11-13 DIAGNOSIS — Z8781 Personal history of (healed) traumatic fracture: Secondary | ICD-10-CM | POA: Diagnosis not present

## 2015-11-13 DIAGNOSIS — M1990 Unspecified osteoarthritis, unspecified site: Secondary | ICD-10-CM | POA: Insufficient documentation

## 2015-11-13 DIAGNOSIS — E119 Type 2 diabetes mellitus without complications: Secondary | ICD-10-CM

## 2015-11-13 DIAGNOSIS — I2510 Atherosclerotic heart disease of native coronary artery without angina pectoris: Secondary | ICD-10-CM | POA: Diagnosis not present

## 2015-11-13 DIAGNOSIS — I10 Essential (primary) hypertension: Secondary | ICD-10-CM | POA: Insufficient documentation

## 2015-11-13 DIAGNOSIS — Z8249 Family history of ischemic heart disease and other diseases of the circulatory system: Secondary | ICD-10-CM | POA: Diagnosis not present

## 2015-11-13 LAB — CBC
HCT: 48.2 % (ref 39.0–52.0)
Hemoglobin: 16.3 g/dL (ref 13.0–17.0)
MCH: 29.6 pg (ref 26.0–34.0)
MCHC: 33.8 g/dL (ref 30.0–36.0)
MCV: 87.6 fL (ref 78.0–100.0)
Platelets: 220 10*3/uL (ref 150–400)
RBC: 5.5 MIL/uL (ref 4.22–5.81)
RDW: 14.7 % (ref 11.5–15.5)
WBC: 8.6 10*3/uL (ref 4.0–10.5)

## 2015-11-13 LAB — BASIC METABOLIC PANEL
Anion gap: 11 (ref 5–15)
BUN: 14 mg/dL (ref 6–20)
CO2: 22 mmol/L (ref 22–32)
Calcium: 10.1 mg/dL (ref 8.9–10.3)
Chloride: 104 mmol/L (ref 101–111)
Creatinine, Ser: 0.86 mg/dL (ref 0.61–1.24)
GFR calc Af Amer: >60 mL/min (ref >60)
GFR calc non Af Amer: >60 mL/min (ref >60)
Glucose, Bld: 123 mg/dL — ABNORMAL HIGH (ref 65–99)
Potassium: 4.1 mmol/L (ref 3.5–5.1)
Sodium: 137 mmol/L (ref 135–145)

## 2015-11-13 LAB — TSH: TSH: 3.576 u[IU]/mL (ref 0.350–4.500)

## 2015-11-13 MED ORDER — METOPROLOL TARTRATE 25 MG PO TABS: 25.0000 mg | ORAL_TABLET | Freq: Two times a day (BID) | ORAL | 2 refills | Status: DC

## 2015-11-13 NOTE — Telephone Encounter (Signed)
New Message:   Pt is in Atrial Fib,wants to talk to somebody now please.

## 2015-11-13 NOTE — Patient Instructions (Signed)
Cardioversion scheduled for Wednesday, October 25th  - Arrive at the [facility] and go to admitting at 12:30pm  -Do not eat or drink anything after midnight the night prior to your procedure.  - Take all your medication with a sip of water prior to arrival.  - You will not be able to drive home after your procedure.  Your physician has recommended you make the following change in your medication:  1)Metoprolol 25mg  twice a day
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
## 2015-11-13 NOTE — Telephone Encounter (Signed)
Spoke with pt, he reports he has been in atrial fib now for about 12 hours. He took his last dose of metoprolol last night at 10 pm, patient advised to take 50 mg of lopressor now. He reports feeling tired and occ tight feeling in his chest. He will see the atrial fib clinic today at 3 pm.

## 2015-11-18 ENCOUNTER — Other Ambulatory Visit: Payer: Self-pay | Admitting: Endocrinology

## 2015-11-19 ENCOUNTER — Ambulatory Visit (HOSPITAL_COMMUNITY): Payer: BLUE CROSS/BLUE SHIELD | Admitting: Anesthesiology

## 2015-11-19 ENCOUNTER — Encounter (HOSPITAL_COMMUNITY): Payer: Self-pay

## 2015-11-19 ENCOUNTER — Ambulatory Visit (HOSPITAL_COMMUNITY)
Admission: RE | Admit: 2015-11-19 | Discharge: 2015-11-19 | Disposition: A | Discharge status: Home / Self Care | Payer: BLUE CROSS/BLUE SHIELD | Source: Ambulatory Visit | Attending: Cardiovascular Disease | Admitting: Cardiovascular Disease

## 2015-11-19 ENCOUNTER — Encounter (HOSPITAL_COMMUNITY)
Admission: RE | Disposition: A | Discharge status: Home / Self Care | Payer: Self-pay | Source: Ambulatory Visit | Attending: Cardiovascular Disease

## 2015-11-19 DIAGNOSIS — I10 Essential (primary) hypertension: Secondary | ICD-10-CM | POA: Insufficient documentation

## 2015-11-19 DIAGNOSIS — Z7901 Long term (current) use of anticoagulants: Secondary | ICD-10-CM | POA: Insufficient documentation

## 2015-11-19 DIAGNOSIS — E119 Type 2 diabetes mellitus without complications: Secondary | ICD-10-CM | POA: Insufficient documentation

## 2015-11-19 DIAGNOSIS — Z79899 Other long term (current) drug therapy: Secondary | ICD-10-CM | POA: Insufficient documentation

## 2015-11-19 DIAGNOSIS — E7800 Pure hypercholesterolemia, unspecified: Secondary | ICD-10-CM | POA: Insufficient documentation

## 2015-11-19 DIAGNOSIS — Z794 Long term (current) use of insulin: Secondary | ICD-10-CM | POA: Insufficient documentation

## 2015-11-19 DIAGNOSIS — I484 Atypical atrial flutter: Secondary | ICD-10-CM | POA: Insufficient documentation

## 2015-11-19 DIAGNOSIS — M1990 Unspecified osteoarthritis, unspecified site: Secondary | ICD-10-CM | POA: Insufficient documentation

## 2015-11-19 DIAGNOSIS — Z880 Allergy status to penicillin: Secondary | ICD-10-CM | POA: Insufficient documentation

## 2015-11-19 DIAGNOSIS — I4891 Unspecified atrial fibrillation: Secondary | ICD-10-CM | POA: Diagnosis not present

## 2015-11-19 DIAGNOSIS — I2510 Atherosclerotic heart disease of native coronary artery without angina pectoris: Secondary | ICD-10-CM | POA: Insufficient documentation

## 2015-11-19 DIAGNOSIS — E669 Obesity, unspecified: Secondary | ICD-10-CM | POA: Insufficient documentation

## 2015-11-19 DIAGNOSIS — I481 Persistent atrial fibrillation: Secondary | ICD-10-CM | POA: Insufficient documentation

## 2015-11-19 HISTORY — PX: CARDIOVERSION: SHX1299

## 2015-11-19 LAB — POCT I-STAT, CHEM 8
BUN: 21 mg/dL — ABNORMAL HIGH (ref 6–20)
Calcium, Ion: 1.18 mmol/L (ref 1.15–1.40)
Chloride: 105 mmol/L (ref 101–111)
Creatinine, Ser: 0.8 mg/dL (ref 0.61–1.24)
Glucose, Bld: 149 mg/dL — ABNORMAL HIGH (ref 65–99)
HCT: 48 % (ref 39.0–52.0)
Hemoglobin: 16.3 g/dL (ref 13.0–17.0)
Potassium: 4.2 mmol/L (ref 3.5–5.1)
Sodium: 140 mmol/L (ref 135–145)
TCO2: 23 mmol/L (ref 0–100)

## 2015-11-19 SURGERY — CARDIOVERSION
Anesthesia: Monitor Anesthesia Care

## 2015-11-19 MED ORDER — PROPOFOL 10 MG/ML IV BOLUS
INTRAVENOUS | Status: DC | PRN
  Administered 2015-11-19: 100 mg via INTRAVENOUS

## 2015-11-19 MED ORDER — SODIUM CHLORIDE 0.9 % IV SOLN
INTRAVENOUS | Status: DC
  Administered 2015-11-19: 13:00:00 via INTRAVENOUS

## 2015-11-19 MED ORDER — LIDOCAINE HCL (CARDIAC) 20 MG/ML IV SOLN
INTRAVENOUS | Status: DC | PRN
  Administered 2015-11-19: 60 mg via INTRAVENOUS

## 2015-11-19 NOTE — Interval H&P Note (Signed)
History and Physical Interval Note:  11/19/2015 12:34 PM  Lawrence [place]  has presented today for surgery, with the diagnosis of AFIB FLUTTER  The various methods of treatment have been discussed with the patient and family. After consideration of risks, benefits and other options for treatment, the patient has consented to  Procedure(s): CARDIOVERSION (N/A) as a surgical intervention .  The patient's history has been reviewed, patient examined, no change in status, stable for surgery.  I have reviewed the patient's chart and labs.  Questions were answered to the patient's satisfaction.     Jenkins Rouge

## 2015-11-19 NOTE — Transfer of Care (Signed)
Immediate Anesthesia Transfer of Care Note  Patient: Lawrence [place]  Procedure(s) Performed: Procedure(s): CARDIOVERSION (N/A)  Patient Location: PACU and Endoscopy Unit  Anesthesia Type:MAC  Level of Consciousness: awake, alert , oriented and patient cooperative  Airway & Oxygen Therapy: Patient Spontanous Breathing and Patient connected to nasal cannula oxygen  Post-op Assessment: Report given to RN and Post -op Vital signs reviewed and stable  Post vital signs: Reviewed and stable  Last Vitals:  Vitals:   11/19/15 1230  Pulse: (!) 145  Resp: (!) 23    Last Pain:  Vitals:   11/19/15 1230  TempSrc: Oral         Complications: No apparent anesthesia complications

## 2015-11-19 NOTE — Anesthesia Postprocedure Evaluation (Signed)
Anesthesia Post Note  Patient: Lawrence [place]  Procedure(s) Performed: Procedure(s) (LRB): CARDIOVERSION (N/A)  Patient location during evaluation: Endoscopy Anesthesia Type: MAC Level of consciousness: awake and alert Pain management: pain level controlled Vital Signs Assessment: post-procedure vital signs reviewed and stable Respiratory status: spontaneous breathing, nonlabored ventilation, respiratory function stable and patient connected to nasal cannula oxygen Cardiovascular status: stable and blood pressure returned to baseline Anesthetic complications: no    Last Vitals:  Vitals:   11/19/15 1230  Pulse: (!) 145  Resp: (!) 23    Last Pain:  Vitals:   11/19/15 1230  TempSrc: Oral                 Montez Hageman
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
## 2015-11-19 NOTE — Anesthesia Preprocedure Evaluation (Signed)
Anesthesia Evaluation  Patient identified by MRN, date of birth, ID band Patient awake    Reviewed: Allergy & Precautions, H&P , NPO status , Patient's Chart, lab work & pertinent test results  Airway Mallampati: III  TM Distance: >3 FB Neck ROM: Full    Dental  (+) Teeth Intact, Dental Advisory Given   Pulmonary shortness of breath and at rest,    breath sounds clear to auscultation       Cardiovascular hypertension, Pt. on home beta blockers + CAD   Rhythm:Irregular Rate:Normal     Neuro/Psych    GI/Hepatic   Endo/Other  diabetes, Poorly Controlled, Type 2, Oral Hypoglycemic Agents  Renal/GU      Musculoskeletal   Abdominal   Peds  Hematology   Anesthesia Other Findings   Reproductive/Obstetrics                             Anesthesia Physical  Anesthesia Plan  ASA: III  Anesthesia Plan: MAC   Post-op Pain Management:    Induction: Intravenous  Airway Management Planned: Simple Face Mask  Additional Equipment:   Intra-op Plan:   Post-operative Plan:   Informed Consent: I have reviewed the patients History and Physical, chart, labs and discussed the procedure including the risks, benefits and alternatives for the proposed anesthesia with the patient or authorized representative who has indicated his/her understanding and acceptance.   Dental advisory given  Plan Discussed with: CRNA and Anesthesiologist  Anesthesia Plan Comments:         Anesthesia Quick Evaluation

## 2015-11-19 NOTE — CV Procedure (Signed)
[facility] Anesthesia 100 mg propofol 80 mg lidocaine  DCC x 2 150->200J Converted from afib rate 118 to NSR rate 78  On Rx Eliquis No immediate neurologic sequelae  Jenkins Rouge

## 2015-11-19 NOTE — Discharge Instructions (Addendum)

## 2015-11-27 ENCOUNTER — Encounter (HOSPITAL_COMMUNITY): Payer: Self-pay | Admitting: Nurse Practitioner

## 2015-11-27 ENCOUNTER — Ambulatory Visit (HOSPITAL_COMMUNITY)
Admission: RE | Admit: 2015-11-27 | Discharge: 2015-11-27 | Disposition: A | Discharge status: Home / Self Care | Payer: Medicare Other | Source: Ambulatory Visit | Attending: Nurse Practitioner | Admitting: Nurse Practitioner

## 2015-11-27 VITALS — BP 126/80 | HR 70 | Ht 71.0 in | Wt 331.4 lb

## 2015-11-27 DIAGNOSIS — M170 Bilateral primary osteoarthritis of knee: Secondary | ICD-10-CM | POA: Diagnosis not present

## 2015-11-27 DIAGNOSIS — Z882 Allergy status to sulfonamides status: Secondary | ICD-10-CM | POA: Insufficient documentation

## 2015-11-27 DIAGNOSIS — I484 Atypical atrial flutter: Secondary | ICD-10-CM | POA: Diagnosis not present

## 2015-11-27 DIAGNOSIS — Z8249 Family history of ischemic heart disease and other diseases of the circulatory system: Secondary | ICD-10-CM | POA: Insufficient documentation

## 2015-11-27 DIAGNOSIS — E119 Type 2 diabetes mellitus without complications: Secondary | ICD-10-CM | POA: Diagnosis not present

## 2015-11-27 DIAGNOSIS — Z880 Allergy status to penicillin: Secondary | ICD-10-CM | POA: Insufficient documentation

## 2015-11-27 DIAGNOSIS — I481 Persistent atrial fibrillation: Secondary | ICD-10-CM | POA: Diagnosis not present

## 2015-11-27 DIAGNOSIS — Z9109 Other allergy status, other than to drugs and biological substances: Secondary | ICD-10-CM | POA: Diagnosis not present

## 2015-11-27 DIAGNOSIS — Z794 Long term (current) use of insulin: Secondary | ICD-10-CM | POA: Diagnosis not present

## 2015-11-27 DIAGNOSIS — Z823 Family history of stroke: Secondary | ICD-10-CM | POA: Insufficient documentation

## 2015-11-27 DIAGNOSIS — I10 Essential (primary) hypertension: Secondary | ICD-10-CM | POA: Diagnosis not present

## 2015-11-27 DIAGNOSIS — I2510 Atherosclerotic heart disease of native coronary artery without angina pectoris: Secondary | ICD-10-CM | POA: Insufficient documentation

## 2015-11-27 DIAGNOSIS — Z96643 Presence of artificial hip joint, bilateral: Secondary | ICD-10-CM | POA: Insufficient documentation

## 2015-11-27 DIAGNOSIS — Z91041 Radiographic dye allergy status: Secondary | ICD-10-CM | POA: Diagnosis not present

## 2015-11-27 DIAGNOSIS — E7800 Pure hypercholesterolemia, unspecified: Secondary | ICD-10-CM | POA: Insufficient documentation

## 2015-11-27 DIAGNOSIS — Z7901 Long term (current) use of anticoagulants: Secondary | ICD-10-CM | POA: Diagnosis not present

## 2015-11-27 DIAGNOSIS — E669 Obesity, unspecified: Secondary | ICD-10-CM | POA: Insufficient documentation

## 2015-11-27 DIAGNOSIS — Z6841 Body Mass Index (BMI) 40.0 and over, adult: Secondary | ICD-10-CM | POA: Diagnosis not present
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
## 2015-12-22 ENCOUNTER — Encounter: Payer: Self-pay | Admitting: Endocrinology

## 2015-12-22 ENCOUNTER — Ambulatory Visit (INDEPENDENT_AMBULATORY_CARE_PROVIDER_SITE_OTHER): Payer: Medicare Other | Admitting: Endocrinology

## 2015-12-22 VITALS — BP 124/80 | HR 75 | Ht 71.0 in | Wt 330.0 lb

## 2015-12-22 DIAGNOSIS — I10 Essential (primary) hypertension: Secondary | ICD-10-CM

## 2015-12-22 DIAGNOSIS — Z794 Long term (current) use of insulin: Secondary | ICD-10-CM | POA: Diagnosis not present

## 2015-12-22 DIAGNOSIS — E1165 Type 2 diabetes mellitus with hyperglycemia: Secondary | ICD-10-CM | POA: Diagnosis not present

## 2015-12-22 LAB — POCT GLYCOSYLATED HEMOGLOBIN (HGB A1C): Hemoglobin A1C: 7.1
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
## 2015-12-22 NOTE — Patient Instructions (Addendum)
Toujeo 52 and kep am sugar <140  Novolog 10 at lunch and dinner   Check blood sugars on waking up    Also check blood sugars about 2 hours after a meal and do this after different meals by rotation  Recommended blood sugar levels on waking up is 90-130 and about 2 hours after meal is 130-160  Please bring your blood sugar monitor to each visit, thank you  Water exercises

## 2015-12-23 ENCOUNTER — Other Ambulatory Visit: Payer: Self-pay

## 2015-12-23 MED ORDER — GLUCOSE BLOOD VI STRP: ORAL_STRIP | 3 refills | Status: DC

## 2015-12-23 MED ORDER — INSULIN ASPART 100 UNIT/ML FLEXPEN: PEN_INJECTOR | SUBCUTANEOUS | 3 refills | Status: DC

## 2015-12-23 MED ORDER — INSULIN GLARGINE 300 UNIT/ML ~~LOC~~ SOPN: 48.0000 [IU] | PEN_INJECTOR | Freq: Every day | SUBCUTANEOUS | 2 refills | Status: DC

## 2015-12-23 MED ORDER — EMPAGLIFLOZIN 25 MG PO TABS: 25.0000 mg | ORAL_TABLET | Freq: Every day | ORAL | 1 refills | Status: DC

## 2015-12-23 MED ORDER — LIRAGLUTIDE 18 MG/3ML ~~LOC~~ SOPN: PEN_INJECTOR | SUBCUTANEOUS | 2 refills | Status: DC

## 2015-12-23 MED ORDER — METFORMIN HCL ER (OSM) 1000 MG PO TB24: 1000.0000 mg | ORAL_TABLET | Freq: Two times a day (BID) | ORAL | 3 refills | Status: DC

## 2015-12-23 MED ORDER — ONETOUCH DELICA LANCETS FINE MISC: 5 refills | Status: DC

## 2015-12-25 ENCOUNTER — Other Ambulatory Visit: Payer: Self-pay

## 2015-12-25 MED ORDER — GLUCOSE BLOOD VI STRP: ORAL_STRIP | 12 refills | Status: DC

## 2016-01-01 ENCOUNTER — Encounter: Payer: Self-pay | Admitting: Endocrinology

## 2016-01-02 ENCOUNTER — Encounter: Payer: Self-pay | Admitting: Internal Medicine

## 2016-01-02 ENCOUNTER — Other Ambulatory Visit: Payer: Self-pay

## 2016-01-02 MED ORDER — METFORMIN HCL ER (OSM) 1000 MG PO TB24: 1000.0000 mg | ORAL_TABLET | Freq: Two times a day (BID) | ORAL | 3 refills | Status: DC

## 2016-01-06 ENCOUNTER — Other Ambulatory Visit: Payer: Self-pay

## 2016-01-08 ENCOUNTER — Encounter: Payer: Self-pay | Admitting: Internal Medicine

## 2016-01-08 ENCOUNTER — Ambulatory Visit (INDEPENDENT_AMBULATORY_CARE_PROVIDER_SITE_OTHER): Payer: Medicare Other | Admitting: Internal Medicine

## 2016-01-08 VITALS — BP 124/88 | HR 83 | Ht 71.0 in | Wt 334.6 lb

## 2016-01-08 DIAGNOSIS — I10 Essential (primary) hypertension: Secondary | ICD-10-CM | POA: Diagnosis not present

## 2016-01-08 DIAGNOSIS — I481 Persistent atrial fibrillation: Secondary | ICD-10-CM | POA: Diagnosis not present

## 2016-01-08 DIAGNOSIS — I484 Atypical atrial flutter: Secondary | ICD-10-CM

## 2016-01-08 DIAGNOSIS — I4819 Other persistent atrial fibrillation: Secondary | ICD-10-CM

## 2016-01-08 DIAGNOSIS — E119 Type 2 diabetes mellitus without complications: Secondary | ICD-10-CM

## 2016-01-08 DIAGNOSIS — R0602 Shortness of breath: Secondary | ICD-10-CM

## 2016-01-08 MED ORDER — METOPROLOL TARTRATE 25 MG PO TABS
25.0000 mg | ORAL_TABLET | Freq: Two times a day (BID) | ORAL | 2 refills | Status: DC
Start: 2016-01-08 — End: 2016-01-12

## 2016-01-08 NOTE — Patient Instructions (Addendum)
Medication Instructions:  Your physician has recommended you make the following change in your medication:  1) Re-start Metoprolol 25 mg twice daily   Labwork: None ordered   Testing/Procedures: Your physician has requested that you have an echocardiogram. Echocardiography is a painless test that uses sound waves to create images of your heart. It provides your doctor with information about the size and shape of your heart and how well your heart's chambers and valves are working. This procedure takes approximately one hour. There are no restrictions for this procedure.   Your physician has requested that you have a lexiscan myoview. For further information please visit [email or web address]. Please follow instruction sheet, as given.    Follow-Up: Your physician recommends that you schedule a follow-up appointment in: 4 months with Dr [name]
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
## 2016-01-10 ENCOUNTER — Encounter: Payer: Self-pay | Admitting: Endocrinology

## 2016-01-12 ENCOUNTER — Encounter: Payer: Self-pay | Admitting: Internal Medicine

## 2016-01-12 ENCOUNTER — Other Ambulatory Visit: Payer: Self-pay | Admitting: *Deleted

## 2016-01-12 DIAGNOSIS — E119 Type 2 diabetes mellitus without complications: Secondary | ICD-10-CM

## 2016-01-12 MED ORDER — FUROSEMIDE 40 MG PO TABS: 20.0000 mg | ORAL_TABLET | Freq: Every day | ORAL | 3 refills | Status: DC

## 2016-01-12 MED ORDER — PANTOPRAZOLE SODIUM 40 MG PO TBEC: 40.0000 mg | DELAYED_RELEASE_TABLET | Freq: Every day | ORAL | 3 refills | Status: DC

## 2016-01-12 MED ORDER — SIMVASTATIN 20 MG PO TABS: 20.0000 mg | ORAL_TABLET | Freq: Every day | ORAL | 3 refills | Status: DC

## 2016-01-12 MED ORDER — POTASSIUM CHLORIDE CRYS ER 20 MEQ PO TBCR: 20.0000 meq | EXTENDED_RELEASE_TABLET | Freq: Two times a day (BID) | ORAL | 3 refills | Status: DC

## 2016-01-12 MED ORDER — METOPROLOL TARTRATE 25 MG PO TABS: 25.0000 mg | ORAL_TABLET | Freq: Two times a day (BID) | ORAL | 3 refills | Status: DC

## 2016-01-12 MED ORDER — APIXABAN 5 MG PO TABS: ORAL_TABLET | ORAL | 3 refills | Status: DC

## 2016-01-21 ENCOUNTER — Encounter: Payer: Self-pay | Admitting: Endocrinology

## 2016-01-21 ENCOUNTER — Telehealth: Payer: Self-pay

## 2016-01-21 NOTE — Telephone Encounter (Signed)
Patient was recently prescribed 1000 mg (OSM) ER. This rx is not covered under the patient's insurance and will cost the patient about 2000 dollars. The pharmacy tech advised me the covered alternatives would be metformin ER 500mg  and metformin 750 ER. Could you review and please advise during Dr. Ronnie Derby absence? Thanks!

## 2016-01-21 NOTE — Telephone Encounter (Signed)
Please see below:  Dr. Dwyane Dee should be receiving correspondence from [facility] today concerning the authorization/justification of Metformin ER. Can you please expedite the form and return it to them?I will be out of Metformin ER Saturday morning and will require an alternative drug if not approved.  There should be a fax coming in soon.

## 2016-01-22 MED ORDER — METFORMIN HCL ER 500 MG PO TB24: ORAL_TABLET | ORAL | 3 refills | Status: DC

## 2016-01-22 NOTE — Telephone Encounter (Signed)
Yes, this particular formulation is almost never covered. Let's send metformin ER 500 mg tablets - 2 with breakfast and 2 with dinner (thousand milligrams twice a day). Ok to send 360 tablets with 3 refills.

## 2016-01-22 NOTE — Addendum Note (Signed)
Addended by: Verlin Grills T on: 01/22/2016 08:17 AM   Modules accepted: Orders

## 2016-01-22 NOTE — Telephone Encounter (Signed)
I contacted the patient and advised of covering MD response via my chart. Requested the patient to advise [place] if he had any further questions.  Message fwd to Dr. Dwyane Dee for him to review once he returns to the office.

## 2016-01-29 ENCOUNTER — Ambulatory Visit (HOSPITAL_COMMUNITY): Payer: Medicare Other

## 2016-01-30 DIAGNOSIS — E1165 Type 2 diabetes mellitus with hyperglycemia: Secondary | ICD-10-CM | POA: Diagnosis not present

## 2016-01-30 DIAGNOSIS — Z205 Contact with and (suspected) exposure to viral hepatitis: Secondary | ICD-10-CM | POA: Diagnosis not present

## 2016-01-30 DIAGNOSIS — E7800 Pure hypercholesterolemia, unspecified: Secondary | ICD-10-CM | POA: Diagnosis not present

## 2016-01-30 DIAGNOSIS — N400 Enlarged prostate without lower urinary tract symptoms: Secondary | ICD-10-CM | POA: Diagnosis not present

## 2016-01-30 DIAGNOSIS — I10 Essential (primary) hypertension: Secondary | ICD-10-CM | POA: Diagnosis not present

## 2016-01-30 DIAGNOSIS — D519 Vitamin B12 deficiency anemia, unspecified: Secondary | ICD-10-CM | POA: Diagnosis not present

## 2016-02-02 ENCOUNTER — Telehealth (HOSPITAL_COMMUNITY): Payer: Self-pay | Admitting: *Deleted

## 2016-02-02 NOTE — Telephone Encounter (Signed)
Patient given detailed instructions per Myocardial Perfusion Study Information Sheet for the test on 02/04/16 at 1230. Patient notified to arrive 15 minutes early and that it is imperative to arrive on time for appointment to keep from having the test rescheduled.  If you need to cancel or reschedule your appointment, please call the office within 24 hours of your appointment. Failure to do so may result in a cancellation of your appointment, and a $50 no show fee. Patient verbalized understanding.Lawrence Jordan, Ranae Palms

## 2016-02-03 DIAGNOSIS — Z1389 Encounter for screening for other disorder: Secondary | ICD-10-CM | POA: Diagnosis not present

## 2016-02-03 DIAGNOSIS — Z6841 Body Mass Index (BMI) 40.0 and over, adult: Secondary | ICD-10-CM | POA: Diagnosis not present

## 2016-02-03 DIAGNOSIS — I10 Essential (primary) hypertension: Secondary | ICD-10-CM | POA: Diagnosis not present

## 2016-02-03 DIAGNOSIS — Z0001 Encounter for general adult medical examination with abnormal findings: Secondary | ICD-10-CM | POA: Diagnosis not present

## 2016-02-03 DIAGNOSIS — R109 Unspecified abdominal pain: Secondary | ICD-10-CM | POA: Diagnosis not present

## 2016-02-03 DIAGNOSIS — I482 Chronic atrial fibrillation: Secondary | ICD-10-CM | POA: Diagnosis not present

## 2016-02-03 DIAGNOSIS — R1011 Right upper quadrant pain: Secondary | ICD-10-CM | POA: Diagnosis not present

## 2016-02-03 DIAGNOSIS — G4730 Sleep apnea, unspecified: Secondary | ICD-10-CM | POA: Diagnosis not present

## 2016-02-04 ENCOUNTER — Ambulatory Visit (HOSPITAL_BASED_OUTPATIENT_CLINIC_OR_DEPARTMENT_OTHER): Payer: Medicare Other

## 2016-02-04 ENCOUNTER — Other Ambulatory Visit: Payer: Self-pay

## 2016-02-04 ENCOUNTER — Ambulatory Visit (HOSPITAL_COMMUNITY): Payer: Medicare Other

## 2016-02-04 ENCOUNTER — Ambulatory Visit (HOSPITAL_COMMUNITY): Payer: Medicare Other | Attending: Cardiovascular Disease

## 2016-02-04 DIAGNOSIS — I361 Nonrheumatic tricuspid (valve) insufficiency: Secondary | ICD-10-CM | POA: Diagnosis not present

## 2016-02-04 DIAGNOSIS — R9439 Abnormal result of other cardiovascular function study: Secondary | ICD-10-CM | POA: Diagnosis not present

## 2016-02-04 DIAGNOSIS — I501 Left ventricular failure: Secondary | ICD-10-CM | POA: Insufficient documentation

## 2016-02-04 DIAGNOSIS — R0602 Shortness of breath: Secondary | ICD-10-CM | POA: Diagnosis not present

## 2016-02-04 DIAGNOSIS — I340 Nonrheumatic mitral (valve) insufficiency: Secondary | ICD-10-CM | POA: Diagnosis not present

## 2016-02-04 DIAGNOSIS — I351 Nonrheumatic aortic (valve) insufficiency: Secondary | ICD-10-CM | POA: Diagnosis not present

## 2016-02-04 MED ORDER — TECHNETIUM TC 99M TETROFOSMIN IV KIT
32.6000 | PACK | Freq: Once | INTRAVENOUS | Status: AC | PRN
  Administered 2016-02-04: 32.6 via INTRAVENOUS
  Filled 2016-02-04: qty 33

## 2016-02-04 MED ORDER — REGADENOSON 0.4 MG/5ML IV SOLN
0.4000 mg | Freq: Once | INTRAVENOUS | Status: AC
  Administered 2016-02-04: 0.4 mg via INTRAVENOUS

## 2016-02-05 ENCOUNTER — Ambulatory Visit (HOSPITAL_COMMUNITY): Payer: Medicare Other | Attending: Cardiovascular Disease

## 2016-02-05 LAB — MYOCARDIAL PERFUSION IMAGING
LV dias vol: 175 mL (ref 62–150)
LV sys vol: 68 mL
Peak HR: 80 {beats}/min
RATE: 0.32
Rest HR: 68 {beats}/min
SDS: 1
SRS: 3
SSS: 4
TID: 0.94

## 2016-02-05 MED ORDER — TECHNETIUM TC 99M TETROFOSMIN IV KIT
32.1000 | PACK | Freq: Once | INTRAVENOUS | Status: AC | PRN
  Administered 2016-02-05: 32.1 via INTRAVENOUS
  Filled 2016-02-05: qty 33

## 2016-02-10 DIAGNOSIS — Z794 Long term (current) use of insulin: Secondary | ICD-10-CM | POA: Diagnosis not present

## 2016-02-10 DIAGNOSIS — Z7984 Long term (current) use of oral hypoglycemic drugs: Secondary | ICD-10-CM | POA: Diagnosis not present

## 2016-02-10 DIAGNOSIS — E119 Type 2 diabetes mellitus without complications: Secondary | ICD-10-CM | POA: Diagnosis not present

## 2016-02-10 DIAGNOSIS — E1165 Type 2 diabetes mellitus with hyperglycemia: Secondary | ICD-10-CM | POA: Diagnosis not present

## 2016-02-16 DIAGNOSIS — N281 Cyst of kidney, acquired: Secondary | ICD-10-CM | POA: Diagnosis not present

## 2016-02-16 DIAGNOSIS — R1011 Right upper quadrant pain: Secondary | ICD-10-CM | POA: Diagnosis not present

## 2016-02-16 DIAGNOSIS — K760 Fatty (change of) liver, not elsewhere classified: Secondary | ICD-10-CM | POA: Diagnosis not present

## 2016-02-20 DIAGNOSIS — R1011 Right upper quadrant pain: Secondary | ICD-10-CM | POA: Diagnosis not present

## 2016-02-20 DIAGNOSIS — N281 Cyst of kidney, acquired: Secondary | ICD-10-CM | POA: Diagnosis not present

## 2016-02-20 DIAGNOSIS — I700 Atherosclerosis of aorta: Secondary | ICD-10-CM | POA: Diagnosis not present

## 2016-02-22 ENCOUNTER — Encounter: Payer: Self-pay | Admitting: Cardiology

## 2016-02-23 ENCOUNTER — Encounter: Payer: Self-pay | Admitting: Internal Medicine

## 2016-02-24 ENCOUNTER — Telehealth: Payer: Self-pay | Admitting: *Deleted

## 2016-02-24 NOTE — Telephone Encounter (Signed)
Patient called today needing a prescription for cpap supplies, his sleep doctor was dr Redmond Pulling who has left town and he has never seen dr Statistician. [facility] is faxing over a recent download so that we can make him an appointment with dr turner to re-establish as a sleep patient.

## 2016-02-24 NOTE — Telephone Encounter (Signed)
New patient coming on feb 26th to see dr turner for sleep. He is coming over from dr Redmond Pulling who left town but he does see dr [name] for his cardiology care. Fax received today  from [facility] with his recent download.

## 2016-02-26 ENCOUNTER — Telehealth: Payer: Self-pay | Admitting: *Deleted

## 2016-02-26 NOTE — Telephone Encounter (Signed)
Called the patient and gave him his results, he verbalized understanding and asked if Dr Radford Pax would now order his supplies. Dr Redmond Pulling followed his sleep but he has left town and the patient never inquired to see who took over Dr Laqueta Linden patients but ordered his supplies off line. He is using really old supplies because he is out of all his current ones. He has an appointment 03/22/16 and plans to keep that appointment but he would like an earlier appointment if there is a cancellation so he can get his new supplies asap

## 2016-02-26 NOTE — Telephone Encounter (Signed)
-----   Message from [name], MD sent at 02/26/2016  2:29 PM EST ----- Good AHI and compliance.  Continue current CPAP settings.

## 2016-03-02 DIAGNOSIS — L300 Nummular dermatitis: Secondary | ICD-10-CM | POA: Diagnosis not present

## 2016-03-02 DIAGNOSIS — I872 Venous insufficiency (chronic) (peripheral): Secondary | ICD-10-CM | POA: Diagnosis not present

## 2016-03-02 DIAGNOSIS — L603 Nail dystrophy: Secondary | ICD-10-CM | POA: Diagnosis not present

## 2016-03-22 ENCOUNTER — Ambulatory Visit (INDEPENDENT_AMBULATORY_CARE_PROVIDER_SITE_OTHER): Payer: Medicare Other | Admitting: Cardiology

## 2016-03-22 ENCOUNTER — Encounter: Payer: Self-pay | Admitting: Cardiology

## 2016-03-22 VITALS — BP 130/84 | HR 67 | Ht 71.0 in | Wt 336.1 lb

## 2016-03-22 DIAGNOSIS — I481 Persistent atrial fibrillation: Secondary | ICD-10-CM | POA: Diagnosis not present

## 2016-03-22 DIAGNOSIS — G4733 Obstructive sleep apnea (adult) (pediatric): Secondary | ICD-10-CM | POA: Diagnosis not present

## 2016-03-22 DIAGNOSIS — I10 Essential (primary) hypertension: Secondary | ICD-10-CM | POA: Diagnosis not present

## 2016-03-22 DIAGNOSIS — I4819 Other persistent atrial fibrillation: Secondary | ICD-10-CM

## 2016-03-22 HISTORY — DX: Obstructive sleep apnea (adult) (pediatric): G47.33

## 2016-03-22 NOTE — Patient Instructions (Signed)
Medication Instructions:  Your physician recommends that you continue on your current medications as directed. Please refer to the Current Medication list given to you today.   Labwork: None  Testing/Procedures: None  Follow-Up: Your physician wants you to follow-up in: 1 year with Dr. Radford Pax. You will receive a reminder letter in the mail two months in advance. If you don't receive a letter, please call our office to schedule the follow-up appointment.   Any Other Special Instructions Will Be Listed Below (If Applicable). Dr. Radford Pax has ordered you a new mask and supplies from [facility].   Gae Bon is our office CPAP assistant. You may call her directly at [phone].    If you need a refill on your cardiac medications before your next appointment, please call your pharmacy.
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
## 2016-03-23 ENCOUNTER — Ambulatory Visit (INDEPENDENT_AMBULATORY_CARE_PROVIDER_SITE_OTHER): Payer: Medicare Other | Admitting: Endocrinology

## 2016-03-23 ENCOUNTER — Encounter: Payer: Self-pay | Admitting: Endocrinology

## 2016-03-23 VITALS — BP 136/74 | HR 59 | Ht 71.0 in | Wt 338.0 lb

## 2016-03-23 DIAGNOSIS — E1165 Type 2 diabetes mellitus with hyperglycemia: Secondary | ICD-10-CM | POA: Diagnosis not present

## 2016-03-23 DIAGNOSIS — Z794 Long term (current) use of insulin: Secondary | ICD-10-CM | POA: Diagnosis not present

## 2016-03-23 DIAGNOSIS — E782 Mixed hyperlipidemia: Secondary | ICD-10-CM

## 2016-03-23 LAB — POCT GLYCOSYLATED HEMOGLOBIN (HGB A1C): Hemoglobin A1C: 7.1

## 2016-03-23 NOTE — Addendum Note (Signed)
Addended by: Nile Riggs on: 03/23/2016 04:53 PM   Modules accepted: Orders

## 2016-03-23 NOTE — Patient Instructions (Addendum)
   Toujeo 56 units and keep am 100-140, also after supper needs to be < 180 at least  Check blood sugars on waking up  3x weekly  Also check blood sugars about 2 hours after a meal and do this after different meals by rotation  Recommended blood sugar levels on waking up is 90-130 and about 2 hours after meal is 130-160  Please bring your blood sugar monitor to each visit, thank you
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
## 2016-04-06 DIAGNOSIS — L0100 Impetigo, unspecified: Secondary | ICD-10-CM | POA: Diagnosis not present

## 2016-04-06 DIAGNOSIS — D492 Neoplasm of unspecified behavior of bone, soft tissue, and skin: Secondary | ICD-10-CM | POA: Diagnosis not present

## 2016-04-06 DIAGNOSIS — L089 Local infection of the skin and subcutaneous tissue, unspecified: Secondary | ICD-10-CM | POA: Diagnosis not present

## 2016-04-06 DIAGNOSIS — L404 Guttate psoriasis: Secondary | ICD-10-CM | POA: Diagnosis not present

## 2016-05-03 ENCOUNTER — Other Ambulatory Visit: Payer: Self-pay | Admitting: Internal Medicine

## 2016-05-03 DIAGNOSIS — E119 Type 2 diabetes mellitus without complications: Secondary | ICD-10-CM

## 2016-05-28 ENCOUNTER — Telehealth (HOSPITAL_COMMUNITY): Payer: Self-pay | Admitting: *Deleted

## 2016-05-28 NOTE — Telephone Encounter (Signed)
Patient called back stating HR is still running around 140. Discussed with Roderic Palau NP -- recommended he can either continue metoprolol 50mg  BID for next day or so and see if this converts him or he can go to ER to be cardioverted since he has missed no doses of eliquis. Pt prefers to give metoprolol time to work see if this will transition back to NSR. Pt will call if further issues.

## 2016-05-28 NOTE — Telephone Encounter (Signed)
Patient called in stating he has been in Afib for about 20 hours. He has not tried taking any extra medication and his HR is in the 140s. Pt is short of breath. Pt has not taken his morning dose of metoprolol. Instructed pt to take 50mg  of metoprolol now and call back after lunch with HR readings.

## 2016-06-02 ENCOUNTER — Telehealth (HOSPITAL_COMMUNITY): Payer: Self-pay | Admitting: *Deleted

## 2016-06-02 NOTE — Telephone Encounter (Signed)
Patient called back today stating he is still in afib. He decided to try increasing his metoprolol to 25mg  TID but this still has not helped his rate. HR is still running between 80-150 with mainly rates above 100. Will see if scheduler can add on tomorrow. Pt in agreement.

## 2016-06-03 ENCOUNTER — Ambulatory Visit (INDEPENDENT_AMBULATORY_CARE_PROVIDER_SITE_OTHER): Payer: Medicare Other | Admitting: Internal Medicine

## 2016-06-03 ENCOUNTER — Encounter: Payer: Self-pay | Admitting: Internal Medicine

## 2016-06-03 ENCOUNTER — Encounter: Payer: Self-pay | Admitting: *Deleted

## 2016-06-03 VITALS — BP 132/96 | HR 136 | Ht 71.0 in | Wt 335.0 lb

## 2016-06-03 DIAGNOSIS — I10 Essential (primary) hypertension: Secondary | ICD-10-CM | POA: Diagnosis not present

## 2016-06-03 DIAGNOSIS — R0789 Other chest pain: Secondary | ICD-10-CM | POA: Diagnosis not present

## 2016-06-03 DIAGNOSIS — I4892 Unspecified atrial flutter: Secondary | ICD-10-CM

## 2016-06-03 LAB — CBC WITH DIFFERENTIAL/PLATELET
Basophils Absolute: 0 10*3/uL (ref 0.0–0.2)
Basos: 1 %
EOS (ABSOLUTE): 0.2 10*3/uL (ref 0.0–0.4)
Eos: 3 %
Hematocrit: 44.9 % (ref 37.5–51.0)
Hemoglobin: 15.2 g/dL (ref 13.0–17.7)
Immature Grans (Abs): 0 10*3/uL (ref 0.0–0.1)
Immature Granulocytes: 0 %
Lymphocytes Absolute: 2.4 10*3/uL (ref 0.7–3.1)
Lymphs: 38 %
MCH: 30.5 pg (ref 26.6–33.0)
MCHC: 33.9 g/dL (ref 31.5–35.7)
MCV: 90 fL (ref 79–97)
Monocytes Absolute: 0.5 10*3/uL (ref 0.1–0.9)
Monocytes: 7 %
Neutrophils Absolute: 3.3 10*3/uL (ref 1.4–7.0)
Neutrophils: 51 %
Platelets: 207 10*3/uL (ref 150–379)
RBC: 4.98 x10E6/uL (ref 4.14–5.80)
RDW: 15.1 % (ref 12.3–15.4)
WBC: 6.4 10*3/uL (ref 3.4–10.8)

## 2016-06-03 LAB — BASIC METABOLIC PANEL
BUN/Creatinine Ratio: 21 (ref 10–24)
BUN: 18 mg/dL (ref 8–27)
CO2: 21 mmol/L (ref 18–29)
Calcium: 10 mg/dL (ref 8.6–10.2)
Chloride: 97 mmol/L (ref 96–106)
Creatinine, Ser: 0.86 mg/dL (ref 0.76–1.27)
GFR calc Af Amer: 105 mL/min/{1.73_m2} (ref >59)
GFR calc non Af Amer: 91 mL/min/{1.73_m2} (ref >59)
Glucose: 142 mg/dL — ABNORMAL HIGH (ref 65–99)
Potassium: 4.6 mmol/L (ref 3.5–5.2)
Sodium: 139 mmol/L (ref 134–144)

## 2016-06-03 LAB — MAGNESIUM: Magnesium: 1.8 mg/dL (ref 1.6–2.3)

## 2016-06-03 MED ORDER — METOPROLOL TARTRATE 50 MG PO TABS: 50.0000 mg | ORAL_TABLET | Freq: Four times a day (QID) | ORAL | 1 refills | Status: DC

## 2016-06-03 NOTE — Patient Instructions (Signed)
Medication Instructions:  Increase metoprolol tartrate (lopressor) to 50 mg four times a day.  Labwork: BMET/CBCd/Magnesium level today.  Testing/Procedures: Your physician has recommended that you have a Cardioversion (DCCV). Electrical Cardioversion uses a jolt of electricity to your heart either through paddles or wired patches attached to your chest. This is a controlled, usually prescheduled, procedure. Defibrillation is done under light anesthesia in the hospital, and you usually go home the day of the procedure. This is done to get your heart back into a normal rhythm. You are not awake for the procedure. Please see the instruction sheet given to you today.  Friday May 11,2018  Follow-Up: Your physician recommends that you schedule a follow-up appointment next week with Dr [name] or Roderic Palau, NP if Dr [name] not available.        If you need a refill on your cardiac medications before your next appointment, please call your pharmacy.

## 2016-06-03 NOTE — Progress Notes (Signed)
Follow-up Outpatient Visit Date: 06/03/2016  Primary Care Provider: [name], MD [address] [place] [address]  Chief Complaint: Fatigue and elevated heart rate  HPI:  Mr. Acree is a 66 y.o. year-old male with history of paroxysmal atrial fibrillation status post A. fib/flutter ablation in 2015, hypertension, hyperlipidemia, type 2 diabetes mellitus, and nonobstructive CAD with 30-40% ostial LMCA stenosis in 04/2010, who is seen as an acute visit to to rapid heart rates. Mr. Frontera notes that his heart rate has been "up and down" over the last week. He has been checking his heart rate using his watch and a pulse oximeter, noting it has ranged from 30-159 bpm. He has also felt more fatigued than usual during this time. He has experienced occasional sharp left-sided chest pains along the midaxillary line. It is a stabbing sensation the last for 1 or 2 seconds. This often occurs at rest. With his elevated heart rate, Mr. Cobarrubias was advised to increase his metoprolol to 50 mg twice a day by the a-fib clinic. He is now taking it 3 times a day but has continued to have elevated heart rates. He remains compliant with his medications, including apixaban, he has been taking he has noted occasional scant bright red blood per rectum, that he attributes to hemorrhoids  --------------------------------------------------------------------------------------------------  Cardiovascular History & Procedures: Cardiovascular Problems:  Atrial fibrillation/flutter status post ablation  Nonobstructive coronary artery disease  Risk Factors:  Known CAD, hypertension, hyperlipidemia, diabetes mellitus, morbid obesity, male gender, age greater than 18.  Cath/PCI:  LHC (04/29/10): 30-40% ostial LMCA (FFR 0.95), 2030% LAD, 40% proximal RCA.  CV Surgery:  None  EP Procedures and Devices:  Atrial fibrillation and flutter ablation (10/18/13, Dr. [name])  Non-Invasive Evaluation(s):  Pharmacologic  myocardial perfusion stress test (02/04/16): Low risk study with normal perfusion and LVEF.  TTE (02/04/16): Normal LV size with mild LVH. LVEF 65-70% with normal wall motion and diastolic function. Mild aortic regurgitation. Severely dilated left atrium. Normal RV size and function. Normal PA pressure..  Recent CV Pertinent Labs: Lab Results  Component Value Date   CHOL 153 09/23/2015   HDL 41.30 09/23/2015   LDLCALC 89 09/23/2015   TRIG 115.0 09/23/2015   CHOLHDL 4 09/23/2015   INR 1.02 03/05/2013   K 4.2 11/19/2015   MG 1.9 11/29/2012   BUN 21 (H) 11/19/2015   CREATININE 0.80 11/19/2015    Past medical and surgical history were reviewed and updated in EPIC.  Outpatient Encounter Prescriptions as of 06/03/2016  Medication Sig  . acetaminophen (TYLENOL) 500 MG tablet Take 1,000 mg by mouth 2 (two) times daily.  Marland Kitchen apixaban (ELIQUIS) 5 MG TABS tablet TAKE 1 TABLET (5 MG TOTAL) BY MOUTH 2 (TWO) TIMES DAILY.  . cholecalciferol (VITAMIN D) 1000 UNITS tablet Take 1,000 Units by mouth daily.   . empagliflozin (JARDIANCE) 25 MG TABS tablet Take 25 mg by mouth daily.  . furosemide (LASIX) 40 MG tablet Take 0.5 tablets (20 mg total) by mouth daily.  Marland Kitchen glucose blood (ACCU-CHEK ACTIVE STRIPS) test strip TEST BLOOD SUGAR TWICE A DAY AS DIRECTED  . insulin aspart (NOVOLOG) 100 UNIT/ML FlexPen Inject 14 Units into the skin 3 (three) times daily with meals.   . Insulin Glargine (TOUJEO SOLOSTAR) 300 UNIT/ML SOPN Inject 56 Units into the skin daily.   . Insulin Pen Needle (B-D UF III MINI PEN NEEDLES) 31G X 5 MM MISC Use 4 per day to inject insulin  . liraglutide (VICTOZA) 18 MG/3ML SOPN  INJECT 0.3 MLS (1.8 MG TOTAL) INTO THE SKIN DAILY.  . metFORMIN (GLUCOPHAGE-XR) 500 MG 24 hr tablet 2 tabs with breakfast 2 tabs with dinner  . metoprolol tartrate (LOPRESSOR) 25 MG tablet TAKE 1 TABLET TWICE DAILY (Patient taking differently: take 2 tablets (50mg ) three times daily by mouth)  . Multiple Vitamin  (MULTIVITAMIN) tablet Take 1 tablet by mouth daily.    Glory Rosebush DELICA LANCETS FINE MISC USE TO CHECK BLOOD SUGAR 2 TIMES PER DAY dx code E11.9  . pantoprazole (PROTONIX) 40 MG tablet Take 1 tablet (40 mg total) by mouth daily.  . potassium chloride SA (KLOR-CON M20) 20 MEQ tablet Take 1 tablet (20 mEq total) by mouth 2 (two) times daily.  . simvastatin (ZOCOR) 20 MG tablet Take 1 tablet (20 mg total) by mouth daily at 6 PM.  . triamcinolone cream (KENALOG) 0.1 % Apply 1 application topically as directed.  . [DISCONTINUED] glucose blood (ONETOUCH VERIO) test strip TEST BLOOD SUGAR TWICE A DAY AS DIRECTED **DX E11.9   No facility-administered encounter medications on file as of 06/03/2016.     Allergies: Iodinated diagnostic agents; Sulfa antibiotics; Adhesive [tape]; and Amoxicillin  Social History   Social History  . Marital status: Married    Spouse name: N/A  . Number of children: 2  . Years of education: N/A   Occupational History  . Not on file.   Social History Main Topics  . Smoking status: Never Smoker  . Smokeless tobacco: Never Used  . Alcohol use Yes     Comment: Occasional-socially  . Drug use: No  . Sexual activity: Yes   Other Topics Concern  . Not on file   Social History Narrative  . No narrative on file    Family History  Problem Relation Age of Onset  . Heart attack Mother        CABG  . Hyperlipidemia Mother   . Hypertension Mother   . Aortic aneurysm Mother        Ruptured  . Heart attack Father 26       44 and 10 yrs old 2nd was fatal  . Stroke Sister   . Fibromyalgia Sister     Review of Systems: A 12-system review of systems was performed and was negative except as noted in the HPI.  --------------------------------------------------------------------------------------------------  Physical Exam: BP (!) 132/96 (BP Location: Left Arm, Patient Position: Sitting, Cuff Size: Large)   Pulse (!) 136   Ht 5\' 11"  (1.803 m)   Wt (!) 335 lb  (152 kg)   SpO2 97%   BMI 46.72 kg/m   General:  Morbidly obese man, seated, bleeding exam room. HEENT: No conjunctival pallor or scleral icterus.  Moist mucous membranes.  OP clear. Neck: Supple without lymphadenopathy, thyromegaly, JVD, or HJR, though evaluation is limited by body habitus. Lungs: Normal work of breathing.  Clear to auscultation bilaterally without wheezes or crackles. Heart: Distant heart sounds. Irregularly irregular without murmurs or rubs. Unable to assess PMI due to body habitus. Abd: Bowel sounds present.  Soft, NT/ND. Unable to assess HSM due to body habitus. Ext: Trace pretibial edema.  Radial, PT, and DP pulses are 2+ bilaterally. Skin: warm and dry without rash  EKG:  Atrial fibrillation with rapid ventricular response (ventricular rate 136 bpm). Atrial fibrillation is new compared with prior tracing from 01/08/16.  Lab Results  Component Value Date   WBC 8.6 11/13/2015   HGB 16.3 11/19/2015   HCT 48.0 11/19/2015   MCV  87.6 11/13/2015   PLT 220 11/13/2015    Lab Results  Component Value Date   NA 140 11/19/2015   K 4.2 11/19/2015   CL 105 11/19/2015   CO2 22 11/13/2015   BUN 21 (H) 11/19/2015   CREATININE 0.80 11/19/2015   GLUCOSE 149 (H) 11/19/2015   ALT 31 09/23/2015    Lab Results  Component Value Date   CHOL 153 09/23/2015   HDL 41.30 09/23/2015   LDLCALC 89 09/23/2015   TRIG 115.0 09/23/2015   CHOLHDL 4 09/23/2015   --------------------------------------------------------------------------------------------------  ASSESSMENT AND PLAN: Atrial fibrillation with rapid ventricular response Based on symptoms and patient's home heart rate measurements, it appears that he has been in uncontrolled atrial fibrillation for about a week. EKG today confirms atrial fibrillation with rapid ventricular response. Though he notes increased fatigue, he does not appear to be in distress that would warrant ED referral for admission today. His blood  pressure is normal today as well. I have asked him to increase metoprolol tartrate to 50 mg every 6 hours. We will plan for cardioversion tomorrow. I will defer adding any antiarrhythmic therapy at this time. Mr. Mamone was asked to continue taking his apixaban, which he has been doing faithfully for more than a year. He should follow-up next week with Dr. [name] and/or the a-fib clinic for further management of his atrial arrhythmias.  Atypical chest pain Symptoms are atypical and may be related to atrial fibrillation with rapid ventricular response. No ischemic changes on EKG today. Myocardial perfusion stress test earlier this year was reassuring without ischemia or scar. We will defer further workup pending aforementioned management of his atrial fibrillation with rapid ventricular response.  Hypertension Blood pressure mildly elevated today. We will increase metoprolol as above. Further adjustments to be made after restoration of sinus rhythm.  Follow-up: Return to clinic next week for EP/a-fib follow-up. Nelva Bush, MD 06/03/2016 11:08 AM

## 2016-06-04 ENCOUNTER — Ambulatory Visit (HOSPITAL_COMMUNITY): Payer: Medicare Other | Admitting: Anesthesiology

## 2016-06-04 ENCOUNTER — Ambulatory Visit (HOSPITAL_COMMUNITY)
Admission: RE | Admit: 2016-06-04 | Discharge: 2016-06-04 | Disposition: A | Discharge status: Home / Self Care | Payer: Medicare Other | Source: Ambulatory Visit | Attending: Cardiology | Admitting: Cardiology

## 2016-06-04 ENCOUNTER — Encounter (HOSPITAL_COMMUNITY)
Admission: RE | Disposition: A | Discharge status: Home / Self Care | Payer: Self-pay | Source: Ambulatory Visit | Attending: Cardiology

## 2016-06-04 ENCOUNTER — Encounter (HOSPITAL_COMMUNITY): Payer: Self-pay | Admitting: *Deleted

## 2016-06-04 DIAGNOSIS — Z882 Allergy status to sulfonamides status: Secondary | ICD-10-CM | POA: Insufficient documentation

## 2016-06-04 DIAGNOSIS — I4892 Unspecified atrial flutter: Secondary | ICD-10-CM | POA: Diagnosis not present

## 2016-06-04 DIAGNOSIS — Z7901 Long term (current) use of anticoagulants: Secondary | ICD-10-CM | POA: Insufficient documentation

## 2016-06-04 DIAGNOSIS — R0789 Other chest pain: Secondary | ICD-10-CM | POA: Diagnosis not present

## 2016-06-04 DIAGNOSIS — E7800 Pure hypercholesterolemia, unspecified: Secondary | ICD-10-CM | POA: Diagnosis not present

## 2016-06-04 DIAGNOSIS — I480 Paroxysmal atrial fibrillation: Secondary | ICD-10-CM | POA: Diagnosis not present

## 2016-06-04 DIAGNOSIS — Z91041 Radiographic dye allergy status: Secondary | ICD-10-CM | POA: Diagnosis not present

## 2016-06-04 DIAGNOSIS — E119 Type 2 diabetes mellitus without complications: Secondary | ICD-10-CM | POA: Insufficient documentation

## 2016-06-04 DIAGNOSIS — E785 Hyperlipidemia, unspecified: Secondary | ICD-10-CM | POA: Diagnosis not present

## 2016-06-04 DIAGNOSIS — Z880 Allergy status to penicillin: Secondary | ICD-10-CM | POA: Insufficient documentation

## 2016-06-04 DIAGNOSIS — I10 Essential (primary) hypertension: Secondary | ICD-10-CM | POA: Insufficient documentation

## 2016-06-04 DIAGNOSIS — Z6841 Body Mass Index (BMI) 40.0 and over, adult: Secondary | ICD-10-CM | POA: Insufficient documentation

## 2016-06-04 DIAGNOSIS — I2510 Atherosclerotic heart disease of native coronary artery without angina pectoris: Secondary | ICD-10-CM | POA: Diagnosis not present

## 2016-06-04 DIAGNOSIS — Z794 Long term (current) use of insulin: Secondary | ICD-10-CM | POA: Insufficient documentation

## 2016-06-04 DIAGNOSIS — M1990 Unspecified osteoarthritis, unspecified site: Secondary | ICD-10-CM | POA: Insufficient documentation

## 2016-06-04 DIAGNOSIS — Z8249 Family history of ischemic heart disease and other diseases of the circulatory system: Secondary | ICD-10-CM | POA: Insufficient documentation

## 2016-06-04 DIAGNOSIS — I4891 Unspecified atrial fibrillation: Secondary | ICD-10-CM | POA: Diagnosis not present

## 2016-06-04 HISTORY — PX: CARDIOVERSION: SHX1299

## 2016-06-04 LAB — GLUCOSE, CAPILLARY: Glucose-Capillary: 173 mg/dL — ABNORMAL HIGH (ref 65–99)

## 2016-06-04 SURGERY — CARDIOVERSION
Anesthesia: General

## 2016-06-04 MED ORDER — PROPOFOL 10 MG/ML IV BOLUS
INTRAVENOUS | Status: DC | PRN
  Administered 2016-06-04: 20 mg via INTRAVENOUS
  Administered 2016-06-04: 40 mg via INTRAVENOUS
  Administered 2016-06-04 (×2): 20 mg via INTRAVENOUS

## 2016-06-04 MED ORDER — SODIUM CHLORIDE 0.9 % IV SOLN
INTRAVENOUS | Status: DC
  Administered 2016-06-04: 500 mL via INTRAVENOUS

## 2016-06-04 NOTE — H&P (View-Only) (Signed)
Follow-up Outpatient Visit Date: 06/03/2016  Primary Care Provider: [name], MD [address] [place] [address]  Chief Complaint: Fatigue and elevated heart rate  HPI:  Lawrence Jordan is a 66 y.o. year-old male with history of paroxysmal atrial fibrillation status post A. fib/flutter ablation in 2015, hypertension, hyperlipidemia, type 2 diabetes mellitus, and nonobstructive CAD with 30-40% ostial LMCA stenosis in 04/2010, who is seen as an acute visit to to rapid heart rates. Lawrence Jordan notes that his heart rate has been "up and down" over the last week. He has been checking his heart rate using his watch and a pulse oximeter, noting it has ranged from 30-159 bpm. He has also felt more fatigued than usual during this time. He has experienced occasional sharp left-sided chest pains along the midaxillary line. It is a stabbing sensation the last for 1 or 2 seconds. This often occurs at rest. With his elevated heart rate, Lawrence Jordan was advised to increase his metoprolol to 50 mg twice a day by the a-fib clinic. He is now taking it 3 times a day but has continued to have elevated heart rates. He remains compliant with his medications, including apixaban, he has been taking he has noted occasional scant bright red blood per rectum, that he attributes to hemorrhoids  --------------------------------------------------------------------------------------------------  Cardiovascular History & Procedures: Cardiovascular Problems:  Atrial fibrillation/flutter status post ablation  Nonobstructive coronary artery disease  Risk Factors:  Known CAD, hypertension, hyperlipidemia, diabetes mellitus, morbid obesity, male gender, age greater than 42.  Cath/PCI:  LHC (04/29/10): 30-40% ostial LMCA (FFR 0.95), 2030% LAD, 40% proximal RCA.  CV Surgery:  None  EP Procedures and Devices:  Atrial fibrillation and flutter ablation (10/18/13, Dr. [name])  Non-Invasive Evaluation(s):  Pharmacologic  myocardial perfusion stress test (02/04/16): Low risk study with normal perfusion and LVEF.  TTE (02/04/16): Normal LV size with mild LVH. LVEF 65-70% with normal wall motion and diastolic function. Mild aortic regurgitation. Severely dilated left atrium. Normal RV size and function. Normal PA pressure..  Recent CV Pertinent Labs: Lab Results  Component Value Date   CHOL 153 09/23/2015   HDL 41.30 09/23/2015   LDLCALC 89 09/23/2015   TRIG 115.0 09/23/2015   CHOLHDL 4 09/23/2015   INR 1.02 03/05/2013   K 4.2 11/19/2015   MG 1.9 11/29/2012   BUN 21 (H) 11/19/2015   CREATININE 0.80 11/19/2015    Past medical and surgical history were reviewed and updated in EPIC.  Outpatient Encounter Prescriptions as of 06/03/2016  Medication Sig  . acetaminophen (TYLENOL) 500 MG tablet Take 1,000 mg by mouth 2 (two) times daily.  Marland Kitchen apixaban (ELIQUIS) 5 MG TABS tablet TAKE 1 TABLET (5 MG TOTAL) BY MOUTH 2 (TWO) TIMES DAILY.  . cholecalciferol (VITAMIN D) 1000 UNITS tablet Take 1,000 Units by mouth daily.   . empagliflozin (JARDIANCE) 25 MG TABS tablet Take 25 mg by mouth daily.  . furosemide (LASIX) 40 MG tablet Take 0.5 tablets (20 mg total) by mouth daily.  Marland Kitchen glucose blood (ACCU-CHEK ACTIVE STRIPS) test strip TEST BLOOD SUGAR TWICE A DAY AS DIRECTED  . insulin aspart (NOVOLOG) 100 UNIT/ML FlexPen Inject 14 Units into the skin 3 (three) times daily with meals.   . Insulin Glargine (TOUJEO SOLOSTAR) 300 UNIT/ML SOPN Inject 56 Units into the skin daily.   . Insulin Pen Needle (B-D UF III MINI PEN NEEDLES) 31G X 5 MM MISC Use 4 per day to inject insulin  . liraglutide (VICTOZA) 18 MG/3ML SOPN  INJECT 0.3 MLS (1.8 MG TOTAL) INTO THE SKIN DAILY.  . metFORMIN (GLUCOPHAGE-XR) 500 MG 24 hr tablet 2 tabs with breakfast 2 tabs with dinner  . metoprolol tartrate (LOPRESSOR) 25 MG tablet TAKE 1 TABLET TWICE DAILY (Patient taking differently: take 2 tablets (50mg ) three times daily by mouth)  . Multiple Vitamin  (MULTIVITAMIN) tablet Take 1 tablet by mouth daily.    Glory Rosebush DELICA LANCETS FINE MISC USE TO CHECK BLOOD SUGAR 2 TIMES PER DAY dx code E11.9  . pantoprazole (PROTONIX) 40 MG tablet Take 1 tablet (40 mg total) by mouth daily.  . potassium chloride SA (KLOR-CON M20) 20 MEQ tablet Take 1 tablet (20 mEq total) by mouth 2 (two) times daily.  . simvastatin (ZOCOR) 20 MG tablet Take 1 tablet (20 mg total) by mouth daily at 6 PM.  . triamcinolone cream (KENALOG) 0.1 % Apply 1 application topically as directed.  . [DISCONTINUED] glucose blood (ONETOUCH VERIO) test strip TEST BLOOD SUGAR TWICE A DAY AS DIRECTED **DX E11.9   No facility-administered encounter medications on file as of 06/03/2016.     Allergies: Iodinated diagnostic agents; Sulfa antibiotics; Adhesive [tape]; and Amoxicillin  Social History   Social History  . Marital status: Married    Spouse name: N/A  . Number of children: 2  . Years of education: N/A   Occupational History  . Not on file.   Social History Main Topics  . Smoking status: Never Smoker  . Smokeless tobacco: Never Used  . Alcohol use Yes     Comment: Occasional-socially  . Drug use: No  . Sexual activity: Yes   Other Topics Concern  . Not on file   Social History Narrative  . No narrative on file    Family History  Problem Relation Age of Onset  . Heart attack Mother        CABG  . Hyperlipidemia Mother   . Hypertension Mother   . Aortic aneurysm Mother        Ruptured  . Heart attack Father 60       44 and 23 yrs old 2nd was fatal  . Stroke Sister   . Fibromyalgia Sister     Review of Systems: A 12-system review of systems was performed and was negative except as noted in the HPI.  --------------------------------------------------------------------------------------------------  Physical Exam: BP (!) 132/96 (BP Location: Left Arm, Patient Position: Sitting, Cuff Size: Large)   Pulse (!) 136   Ht 5\' 11"  (1.803 m)   Wt (!) 335 lb  (152 kg)   SpO2 97%   BMI 46.72 kg/m   General:  Morbidly obese man, seated, bleeding exam room. HEENT: No conjunctival pallor or scleral icterus.  Moist mucous membranes.  OP clear. Neck: Supple without lymphadenopathy, thyromegaly, JVD, or HJR, though evaluation is limited by body habitus. Lungs: Normal work of breathing.  Clear to auscultation bilaterally without wheezes or crackles. Heart: Distant heart sounds. Irregularly irregular without murmurs or rubs. Unable to assess PMI due to body habitus. Abd: Bowel sounds present.  Soft, NT/ND. Unable to assess HSM due to body habitus. Ext: Trace pretibial edema.  Radial, PT, and DP pulses are 2+ bilaterally. Skin: warm and dry without rash  EKG:  Atrial fibrillation with rapid ventricular response (ventricular rate 136 bpm). Atrial fibrillation is new compared with prior tracing from 01/08/16.  Lab Results  Component Value Date   WBC 8.6 11/13/2015   HGB 16.3 11/19/2015   HCT 48.0 11/19/2015   MCV  87.6 11/13/2015   PLT 220 11/13/2015    Lab Results  Component Value Date   NA 140 11/19/2015   K 4.2 11/19/2015   CL 105 11/19/2015   CO2 22 11/13/2015   BUN 21 (H) 11/19/2015   CREATININE 0.80 11/19/2015   GLUCOSE 149 (H) 11/19/2015   ALT 31 09/23/2015    Lab Results  Component Value Date   CHOL 153 09/23/2015   HDL 41.30 09/23/2015   LDLCALC 89 09/23/2015   TRIG 115.0 09/23/2015   CHOLHDL 4 09/23/2015   --------------------------------------------------------------------------------------------------  ASSESSMENT AND PLAN: Atrial fibrillation with rapid ventricular response Based on symptoms and patient's home heart rate measurements, it appears that he has been in uncontrolled atrial fibrillation for about a week. EKG today confirms atrial fibrillation with rapid ventricular response. Though he notes increased fatigue, he does not appear to be in distress that would warrant ED referral for admission today. His blood  pressure is normal today as well. I have asked him to increase metoprolol tartrate to 50 mg every 6 hours. We will plan for cardioversion tomorrow. I will defer adding any antiarrhythmic therapy at this time. Lawrence Jordan was asked to continue taking his apixaban, which he has been doing faithfully for more than a year. He should follow-up next week with Dr. [name] and/or the a-fib clinic for further management of his atrial arrhythmias.  Atypical chest pain Symptoms are atypical and may be related to atrial fibrillation with rapid ventricular response. No ischemic changes on EKG today. Myocardial perfusion stress test earlier this year was reassuring without ischemia or scar. We will defer further workup pending aforementioned management of his atrial fibrillation with rapid ventricular response.  Hypertension Blood pressure mildly elevated today. We will increase metoprolol as above. Further adjustments to be made after restoration of sinus rhythm.  Follow-up: Return to clinic next week for EP/a-fib follow-up. Nelva Bush, MD 06/03/2016 11:08 AM

## 2016-06-04 NOTE — CV Procedure (Signed)
    Electrical Cardioversion Procedure Note SHAROD PETSCH [id number] 11-17-1950  Procedure: Electrical Cardioversion Indications:  Atrial Flutter  Time Out: Verified patient identification, verified procedure,medications/allergies/relevent history reviewed, required imaging and test results available.  Performed  Procedure Details  The patient was NPO after midnight. Anesthesia was administered at the beside  by Dr.Moser with  propofol.  Cardioversion was performed with synchronized biphasic defibrillation via AP pads with 120 joules.  1 attempt(s) were performed.  The patient converted to normal sinus rhythm. The patient tolerated the procedure well   IMPRESSION:  Successful cardioversion of atrial flutter.     Candee Furbish 06/04/2016, 9:18 AM

## 2016-06-04 NOTE — Anesthesia Preprocedure Evaluation (Signed)
Anesthesia Evaluation  Patient identified by MRN, date of birth, ID band Patient awake    Reviewed: Allergy & Precautions, H&P , NPO status , Patient's Chart, lab work & pertinent test results  History of Anesthesia Complications Negative for: history of anesthetic complications  Airway Mallampati: III  TM Distance: >3 FB Neck ROM: Full    Dental  (+) Teeth Intact, Dental Advisory Given   Pulmonary shortness of breath and at rest, sleep apnea and Continuous Positive Airway Pressure Ventilation ,    breath sounds clear to auscultation       Cardiovascular hypertension, Pt. on home beta blockers + CAD  + dysrhythmias Atrial Fibrillation  Rhythm:Irregular Rate:Tachycardia     Neuro/Psych negative neurological ROS  negative psych ROS   GI/Hepatic negative GI ROS, Neg liver ROS,   Endo/Other  diabetes, Poorly Controlled, Type 2, Oral Hypoglycemic AgentsMorbid obesity  Renal/GU negative Renal ROS     Musculoskeletal  (+) Arthritis ,   Abdominal   Peds  Hematology   Anesthesia Other Findings   Reproductive/Obstetrics                             Anesthesia Physical Anesthesia Plan  ASA: III  Anesthesia Plan: General   Post-op Pain Management:    Induction: Intravenous  Airway Management Planned: Mask  Additional Equipment: None  Intra-op Plan:   Post-operative Plan:   Informed Consent: I have reviewed the patients History and Physical, chart, labs and discussed the procedure including the risks, benefits and alternatives for the proposed anesthesia with the patient or authorized representative who has indicated his/her understanding and acceptance.   Dental advisory given  Plan Discussed with: CRNA and Surgeon  Anesthesia Plan Comments:         Anesthesia Quick Evaluation

## 2016-06-04 NOTE — Discharge Instructions (Signed)
Electrical Cardioversion, Care After °This sheet gives you information about how to care for yourself after your procedure. Your health care provider may also give you more specific instructions. If you have problems or questions, contact your health care provider. °What can I expect after the procedure? °After the procedure, it is common to have: °· Some redness on the skin where the shocks were given. °Follow these instructions at home: °· Do not drive for 24 hours if you were given a medicine to help you relax (sedative). °· Take over-the-counter and prescription medicines only as told by your health care provider. °· Ask your health care provider how to check your pulse. Check it often. °· Rest for 48 hours after the procedure or as told by your health care provider. °· Avoid or limit your caffeine use as told by your health care provider. °Contact a health care provider if: °· You feel like your heart is beating too quickly or your pulse is not regular. °· You have a serious muscle cramp that does not go away. °Get help right away if: °· You have discomfort in your chest. °· You are dizzy or you feel faint. °· You have trouble breathing or you are short of breath. °· Your speech is slurred. °· You have trouble moving an arm or leg on one side of your body. °· Your fingers or toes turn cold or blue. °This information is not intended to replace advice given to you by your health care provider. Make sure you discuss any questions you have with your health care provider. °Document Released: 11/01/2012 Document Revised: 08/15/2015 Document Reviewed: 07/18/2015 °Elsevier Interactive Patient Education © 2017 [employer] ° °

## 2016-06-04 NOTE — Transfer of Care (Signed)
Immediate Anesthesia Transfer of Care Note  Patient: Lawrence Jordan  Procedure(s) Performed: Procedure(s): CARDIOVERSION (N/A)  Patient Location: Endoscopy Unit  Anesthesia Type:General  Level of Consciousness: drowsy  Airway & Oxygen Therapy: Patient Spontanous Breathing  Post-op Assessment: Report given to RN and Post -op Vital signs reviewed and stable  Post vital signs: Reviewed and stable  Last Vitals:  Vitals:   06/04/16 0801  BP: 114/66  Pulse: (!) 188  Resp: (!) 22  Temp: 36.6 C    Last Pain:  Vitals:   06/04/16 0801  TempSrc: Oral         Complications: No apparent anesthesia complications

## 2016-06-04 NOTE — Interval H&P Note (Signed)
History and Physical Interval Note:  06/04/2016 9:03 AM  Lawrence Jordan  has presented today for surgery, with the diagnosis of afib  The various methods of treatment have been discussed with the patient and family. After consideration of risks, benefits and other options for treatment, the patient has consented to  Procedure(s): CARDIOVERSION (N/A) as a surgical intervention .  The patient's history has been reviewed, patient examined, no change in status, stable for surgery.  I have reviewed the patient's chart and labs.  Questions were answered to the patient's satisfaction.     [employer]

## 2016-06-04 NOTE — Anesthesia Postprocedure Evaluation (Addendum)
Anesthesia Post Note  Patient: Murvin Natal  Procedure(s) Performed: Procedure(s) (LRB): CARDIOVERSION (N/A)  Patient location during evaluation: Endoscopy Anesthesia Type: General Level of consciousness: awake and alert Pain management: pain level controlled Vital Signs Assessment: post-procedure vital signs reviewed and stable Respiratory status: spontaneous breathing, nonlabored ventilation, respiratory function stable and patient connected to nasal cannula oxygen Cardiovascular status: blood pressure returned to baseline and stable Postop Assessment: no signs of nausea or vomiting Anesthetic complications: no       Last Vitals:  Vitals:   06/04/16 0919 06/04/16 0922  BP: 101/69 103/63  Pulse: 67   Resp: (!) 21 (!) 23  Temp:  36.5 C    Last Pain:  Vitals:   06/04/16 0922  TempSrc: Oral                 Jeffre Enriques

## 2016-06-07 ENCOUNTER — Encounter (HOSPITAL_COMMUNITY): Payer: Self-pay | Admitting: Cardiology

## 2016-06-09 DIAGNOSIS — L409 Psoriasis, unspecified: Secondary | ICD-10-CM | POA: Diagnosis not present

## 2016-06-11 ENCOUNTER — Encounter (HOSPITAL_COMMUNITY): Payer: Self-pay | Admitting: Nurse Practitioner

## 2016-06-11 ENCOUNTER — Ambulatory Visit (HOSPITAL_COMMUNITY)
Admission: RE | Admit: 2016-06-11 | Discharge: 2016-06-11 | Disposition: A | Discharge status: Home / Self Care | Payer: Medicare Other | Source: Ambulatory Visit | Attending: Nurse Practitioner | Admitting: Nurse Practitioner

## 2016-06-11 VITALS — BP 122/68 | HR 52 | Ht 71.0 in | Wt 337.0 lb

## 2016-06-11 DIAGNOSIS — Z794 Long term (current) use of insulin: Secondary | ICD-10-CM | POA: Insufficient documentation

## 2016-06-11 DIAGNOSIS — R001 Bradycardia, unspecified: Secondary | ICD-10-CM | POA: Insufficient documentation

## 2016-06-11 DIAGNOSIS — I491 Atrial premature depolarization: Secondary | ICD-10-CM | POA: Diagnosis not present

## 2016-06-11 DIAGNOSIS — Z79899 Other long term (current) drug therapy: Secondary | ICD-10-CM | POA: Insufficient documentation

## 2016-06-11 DIAGNOSIS — E669 Obesity, unspecified: Secondary | ICD-10-CM | POA: Diagnosis not present

## 2016-06-11 DIAGNOSIS — G4733 Obstructive sleep apnea (adult) (pediatric): Secondary | ICD-10-CM | POA: Diagnosis not present

## 2016-06-11 DIAGNOSIS — E119 Type 2 diabetes mellitus without complications: Secondary | ICD-10-CM | POA: Diagnosis not present

## 2016-06-11 DIAGNOSIS — Z7901 Long term (current) use of anticoagulants: Secondary | ICD-10-CM | POA: Insufficient documentation

## 2016-06-11 DIAGNOSIS — E7800 Pure hypercholesterolemia, unspecified: Secondary | ICD-10-CM | POA: Insufficient documentation

## 2016-06-11 DIAGNOSIS — I10 Essential (primary) hypertension: Secondary | ICD-10-CM | POA: Insufficient documentation

## 2016-06-11 DIAGNOSIS — I481 Persistent atrial fibrillation: Secondary | ICD-10-CM | POA: Diagnosis not present

## 2016-06-11 DIAGNOSIS — I2510 Atherosclerotic heart disease of native coronary artery without angina pectoris: Secondary | ICD-10-CM | POA: Insufficient documentation

## 2016-06-11 DIAGNOSIS — I4891 Unspecified atrial fibrillation: Secondary | ICD-10-CM | POA: Diagnosis present

## 2016-06-11 DIAGNOSIS — I4819 Other persistent atrial fibrillation: Secondary | ICD-10-CM
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
## 2016-06-19 ENCOUNTER — Other Ambulatory Visit: Payer: Self-pay | Admitting: Endocrinology

## 2016-06-23 ENCOUNTER — Ambulatory Visit: Payer: Medicare Other | Admitting: Endocrinology

## 2016-06-28 NOTE — Addendum Note (Signed)
Addendum  created 06/28/16 1508 by Oleta Mouse, MD   Sign clinical note

## 2016-07-07 DIAGNOSIS — L281 Prurigo nodularis: Secondary | ICD-10-CM | POA: Diagnosis not present

## 2016-07-07 DIAGNOSIS — L309 Dermatitis, unspecified: Secondary | ICD-10-CM | POA: Diagnosis not present

## 2016-07-20 ENCOUNTER — Inpatient Hospital Stay (HOSPITAL_COMMUNITY)
Admission: AD | Admit: 2016-07-20 | Discharge: 2016-07-22 | DRG: 309 | Disposition: A | Discharge status: Home / Self Care | Payer: Medicare Other | Source: Ambulatory Visit | Attending: Internal Medicine | Admitting: Internal Medicine

## 2016-07-20 ENCOUNTER — Telehealth: Payer: Self-pay | Admitting: Pharmacist

## 2016-07-20 ENCOUNTER — Encounter (HOSPITAL_COMMUNITY): Payer: Self-pay | Admitting: Nurse Practitioner

## 2016-07-20 ENCOUNTER — Ambulatory Visit (HOSPITAL_COMMUNITY)
Admission: RE | Admit: 2016-07-20 | Discharge: 2016-07-20 | Disposition: A | Discharge status: Home / Self Care | Payer: Medicare Other | Source: Ambulatory Visit | Attending: Nurse Practitioner | Admitting: Nurse Practitioner

## 2016-07-20 VITALS — BP 110/64 | HR 140 | Ht 71.0 in | Wt 339.9 lb

## 2016-07-20 DIAGNOSIS — Z91041 Radiographic dye allergy status: Secondary | ICD-10-CM | POA: Diagnosis not present

## 2016-07-20 DIAGNOSIS — G4733 Obstructive sleep apnea (adult) (pediatric): Secondary | ICD-10-CM | POA: Diagnosis not present

## 2016-07-20 DIAGNOSIS — I484 Atypical atrial flutter: Secondary | ICD-10-CM

## 2016-07-20 DIAGNOSIS — E7800 Pure hypercholesterolemia, unspecified: Secondary | ICD-10-CM | POA: Diagnosis not present

## 2016-07-20 DIAGNOSIS — Z6841 Body Mass Index (BMI) 40.0 and over, adult: Secondary | ICD-10-CM

## 2016-07-20 DIAGNOSIS — Z7901 Long term (current) use of anticoagulants: Secondary | ICD-10-CM | POA: Diagnosis not present

## 2016-07-20 DIAGNOSIS — Z882 Allergy status to sulfonamides status: Secondary | ICD-10-CM

## 2016-07-20 DIAGNOSIS — I4892 Unspecified atrial flutter: Secondary | ICD-10-CM | POA: Diagnosis present

## 2016-07-20 DIAGNOSIS — Z79899 Other long term (current) drug therapy: Secondary | ICD-10-CM

## 2016-07-20 DIAGNOSIS — Z96642 Presence of left artificial hip joint: Secondary | ICD-10-CM | POA: Diagnosis present

## 2016-07-20 DIAGNOSIS — E119 Type 2 diabetes mellitus without complications: Secondary | ICD-10-CM | POA: Diagnosis not present

## 2016-07-20 DIAGNOSIS — I481 Persistent atrial fibrillation: Secondary | ICD-10-CM | POA: Diagnosis not present

## 2016-07-20 DIAGNOSIS — Z888 Allergy status to other drugs, medicaments and biological substances status: Secondary | ICD-10-CM

## 2016-07-20 DIAGNOSIS — I10 Essential (primary) hypertension: Secondary | ICD-10-CM | POA: Diagnosis not present

## 2016-07-20 DIAGNOSIS — I2510 Atherosclerotic heart disease of native coronary artery without angina pectoris: Secondary | ICD-10-CM | POA: Diagnosis present

## 2016-07-20 DIAGNOSIS — Z91048 Other nonmedicinal substance allergy status: Secondary | ICD-10-CM

## 2016-07-20 DIAGNOSIS — Z794 Long term (current) use of insulin: Secondary | ICD-10-CM

## 2016-07-20 DIAGNOSIS — I480 Paroxysmal atrial fibrillation: Secondary | ICD-10-CM | POA: Diagnosis not present

## 2016-07-20 DIAGNOSIS — I4891 Unspecified atrial fibrillation: Secondary | ICD-10-CM | POA: Diagnosis not present

## 2016-07-20 HISTORY — DX: Gastro-esophageal reflux disease without esophagitis: K21.9

## 2016-07-20 HISTORY — DX: Dyspnea, unspecified: R06.00

## 2016-07-20 LAB — BASIC METABOLIC PANEL
Anion gap: 10 (ref 5–15)
BUN: 16 mg/dL (ref 6–20)
CO2: 24 mmol/L (ref 22–32)
Calcium: 9.5 mg/dL (ref 8.9–10.3)
Chloride: 104 mmol/L (ref 101–111)
Creatinine, Ser: 0.88 mg/dL (ref 0.61–1.24)
GFR calc Af Amer: >60 mL/min (ref >60)
GFR calc non Af Amer: >60 mL/min (ref >60)
Glucose, Bld: 182 mg/dL — ABNORMAL HIGH (ref 65–99)
Potassium: 4.1 mmol/L (ref 3.5–5.1)
Sodium: 138 mmol/L (ref 135–145)

## 2016-07-20 LAB — MAGNESIUM: Magnesium: 1.6 mg/dL — ABNORMAL LOW (ref 1.7–2.4)

## 2016-07-20 LAB — CBC
HCT: 46.5 % (ref 39.0–52.0)
Hemoglobin: 14.9 g/dL (ref 13.0–17.0)
MCH: 29.3 pg (ref 26.0–34.0)
MCHC: 32 g/dL (ref 30.0–36.0)
MCV: 91.5 fL (ref 78.0–100.0)
Platelets: 176 10*3/uL (ref 150–400)
RBC: 5.08 MIL/uL (ref 4.22–5.81)
RDW: 14.5 % (ref 11.5–15.5)
WBC: 5.9 10*3/uL (ref 4.0–10.5)

## 2016-07-20 LAB — GLUCOSE, CAPILLARY
Glucose-Capillary: 148 mg/dL — ABNORMAL HIGH (ref 65–99)
Glucose-Capillary: 174 mg/dL — ABNORMAL HIGH (ref 65–99)

## 2016-07-20 MED ORDER — POTASSIUM CHLORIDE CRYS ER 20 MEQ PO TBCR
20.0000 meq | EXTENDED_RELEASE_TABLET | Freq: Two times a day (BID) | ORAL | Status: DC
  Administered 2016-07-20 – 2016-07-22 (×4): 20 meq via ORAL
  Filled 2016-07-20 (×4): qty 1

## 2016-07-20 MED ORDER — INSULIN ASPART 100 UNIT/ML ~~LOC~~ SOLN
14.0000 [IU] | Freq: Three times a day (TID) | SUBCUTANEOUS | Status: DC
  Administered 2016-07-20 – 2016-07-21 (×4): 14 [IU] via SUBCUTANEOUS

## 2016-07-20 MED ORDER — SIMVASTATIN 20 MG PO TABS
20.0000 mg | ORAL_TABLET | Freq: Every day | ORAL | Status: DC
  Administered 2016-07-20 – 2016-07-21 (×2): 20 mg via ORAL
  Filled 2016-07-20 (×2): qty 1

## 2016-07-20 MED ORDER — FUROSEMIDE 20 MG PO TABS
20.0000 mg | ORAL_TABLET | Freq: Every day | ORAL | Status: DC
  Administered 2016-07-21 – 2016-07-22 (×2): 20 mg via ORAL
  Filled 2016-07-20 (×2): qty 1

## 2016-07-20 MED ORDER — SODIUM CHLORIDE 0.9% FLUSH
3.0000 mL | Freq: Two times a day (BID) | INTRAVENOUS | Status: DC
  Administered 2016-07-20 – 2016-07-21 (×2): 3 mL via INTRAVENOUS

## 2016-07-20 MED ORDER — PANTOPRAZOLE SODIUM 40 MG PO TBEC
40.0000 mg | DELAYED_RELEASE_TABLET | Freq: Every day | ORAL | Status: DC
  Administered 2016-07-21 – 2016-07-22 (×2): 40 mg via ORAL
  Filled 2016-07-20 (×2): qty 1

## 2016-07-20 MED ORDER — SODIUM CHLORIDE 0.9% FLUSH: 3.0000 mL | INTRAVENOUS | Status: DC | PRN

## 2016-07-20 MED ORDER — MAGNESIUM SULFATE 4 GM/100ML IV SOLN
4.0000 g | Freq: Once | INTRAVENOUS | Status: AC
  Administered 2016-07-20: 4 g via INTRAVENOUS
  Filled 2016-07-20: qty 100

## 2016-07-20 MED ORDER — METFORMIN HCL ER 500 MG PO TB24
1000.0000 mg | ORAL_TABLET | Freq: Two times a day (BID) | ORAL | Status: DC
Start: 2016-07-20 — End: 2016-07-22
  Administered 2016-07-20 – 2016-07-22 (×4): 1000 mg via ORAL
  Filled 2016-07-20 (×4): qty 2

## 2016-07-20 MED ORDER — VITAMIN D 1000 UNITS PO TABS
1000.0000 [IU] | ORAL_TABLET | Freq: Every day | ORAL | Status: DC
  Administered 2016-07-21 – 2016-07-22 (×2): 1000 [IU] via ORAL
  Filled 2016-07-20 (×2): qty 1

## 2016-07-20 MED ORDER — APIXABAN 5 MG PO TABS
5.0000 mg | ORAL_TABLET | Freq: Two times a day (BID) | ORAL | Status: DC
  Administered 2016-07-20 – 2016-07-22 (×4): 5 mg via ORAL
  Filled 2016-07-20 (×4): qty 1

## 2016-07-20 MED ORDER — ADULT MULTIVITAMIN W/MINERALS CH
1.0000 | ORAL_TABLET | Freq: Every day | ORAL | Status: DC
  Administered 2016-07-21 – 2016-07-22 (×2): 1 via ORAL
  Filled 2016-07-20 (×2): qty 1

## 2016-07-20 MED ORDER — ACETAMINOPHEN 500 MG PO TABS
1000.0000 mg | ORAL_TABLET | Freq: Two times a day (BID) | ORAL | Status: DC
  Administered 2016-07-20 – 2016-07-22 (×4): 1000 mg via ORAL
  Filled 2016-07-20 (×4): qty 2

## 2016-07-20 MED ORDER — INSULIN ASPART 100 UNIT/ML ~~LOC~~ SOLN
0.0000 [IU] | Freq: Three times a day (TID) | SUBCUTANEOUS | Status: DC
  Administered 2016-07-20 – 2016-07-22 (×3): 3 [IU] via SUBCUTANEOUS

## 2016-07-20 MED ORDER — SOTALOL HCL 80 MG PO TABS
120.0000 mg | ORAL_TABLET | Freq: Two times a day (BID) | ORAL | Status: DC
  Administered 2016-07-20 – 2016-07-22 (×4): 120 mg via ORAL
  Filled 2016-07-20 (×4): qty 2

## 2016-07-20 MED ORDER — METOPROLOL TARTRATE 50 MG PO TABS
50.0000 mg | ORAL_TABLET | Freq: Four times a day (QID) | ORAL | Status: DC
  Administered 2016-07-20 – 2016-07-22 (×7): 50 mg via ORAL
  Filled 2016-07-20 (×7): qty 1

## 2016-07-20 MED ORDER — LIRAGLUTIDE 18 MG/3ML ~~LOC~~ SOPN: 1.8000 mg | PEN_INJECTOR | Freq: Every day | SUBCUTANEOUS | Status: DC

## 2016-07-20 MED ORDER — INSULIN GLARGINE 100 UNIT/ML ~~LOC~~ SOLN
56.0000 [IU] | Freq: Every day | SUBCUTANEOUS | Status: DC
  Administered 2016-07-20 – 2016-07-21 (×2): 56 [IU] via SUBCUTANEOUS
  Filled 2016-07-20 (×3): qty 0.56

## 2016-07-20 MED ORDER — ACETAMINOPHEN 325 MG PO TABS: 650.0000 mg | ORAL_TABLET | ORAL | Status: DC | PRN

## 2016-07-20 MED ORDER — CANAGLIFLOZIN 100 MG PO TABS
100.0000 mg | ORAL_TABLET | Freq: Every day | ORAL | Status: DC
  Administered 2016-07-21: 100 mg via ORAL
  Filled 2016-07-20 (×2): qty 1

## 2016-07-20 MED ORDER — APIXABAN 5 MG PO TABS: ORAL_TABLET | ORAL | 3 refills | Status: DC

## 2016-07-20 MED ORDER — SODIUM CHLORIDE 0.9 % IV SOLN
250.0000 mL | INTRAVENOUS | Status: DC | PRN
  Administered 2016-07-22: 12:00:00 via INTRAVENOUS

## 2016-07-20 MED ORDER — TRIAMCINOLONE ACETONIDE 0.1 % EX CREA
1.0000 "application " | TOPICAL_CREAM | Freq: Two times a day (BID) | CUTANEOUS | Status: DC
  Administered 2016-07-20 – 2016-07-22 (×4): 1 via TOPICAL
  Filled 2016-07-20: qty 15
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
## 2016-07-20 NOTE — Addendum Note (Signed)
Encounter addended by: [name], MD on: 07/20/2016  5:24 PM<BR>    Actions taken: Sign clinical note, Charge Capture section accepted, LOS modified

## 2016-07-20 NOTE — Telephone Encounter (Signed)
Medication list reviewed in anticipation of upcoming sotolal initiation. Patient is not taking any contraindicated or QTc prolonging medications. Patient is anticoagulated on Eliquis 5mg  BID appropriately. Mag slightly low and will needs to be repleted.   Please ensure that patient has not missed any anticoagulation doses in the 3 weeks.

## 2016-07-21 ENCOUNTER — Encounter (HOSPITAL_COMMUNITY): Payer: Self-pay | Admitting: General Practice

## 2016-07-21 DIAGNOSIS — I10 Essential (primary) hypertension: Secondary | ICD-10-CM

## 2016-07-21 LAB — BASIC METABOLIC PANEL
Anion gap: 9 (ref 5–15)
BUN: 18 mg/dL (ref 6–20)
CO2: 23 mmol/L (ref 22–32)
Calcium: 8.9 mg/dL (ref 8.9–10.3)
Chloride: 107 mmol/L (ref 101–111)
Creatinine, Ser: 0.83 mg/dL (ref 0.61–1.24)
GFR calc Af Amer: >60 mL/min (ref >60)
GFR calc non Af Amer: >60 mL/min (ref >60)
Glucose, Bld: 107 mg/dL — ABNORMAL HIGH (ref 65–99)
Potassium: 3.9 mmol/L (ref 3.5–5.1)
Sodium: 139 mmol/L (ref 135–145)

## 2016-07-21 LAB — GLUCOSE, CAPILLARY
Glucose-Capillary: 123 mg/dL — ABNORMAL HIGH (ref 65–99)
Glucose-Capillary: 119 mg/dL — ABNORMAL HIGH (ref 65–99)
Glucose-Capillary: 92 mg/dL (ref 65–99)
Glucose-Capillary: 121 mg/dL — ABNORMAL HIGH (ref 65–99)

## 2016-07-21 LAB — MAGNESIUM: Magnesium: 2 mg/dL (ref 1.7–2.4)

## 2016-07-21 MED ORDER — SODIUM CHLORIDE 0.9% FLUSH: 3.0000 mL | INTRAVENOUS | Status: DC | PRN

## 2016-07-21 MED ORDER — SODIUM CHLORIDE 0.9 % IV SOLN: 250.0000 mL | INTRAVENOUS | Status: DC

## 2016-07-21 MED ORDER — SODIUM CHLORIDE 0.9% FLUSH: 3.0000 mL | Freq: Two times a day (BID) | INTRAVENOUS | Status: DC

## 2016-07-21 NOTE — Discharge Instructions (Signed)

## 2016-07-21 NOTE — Progress Notes (Addendum)
Progress Note  Patient Name: Lawrence Jordan Date of Encounter: 07/21/2016  Primary Cardiologist: Dr. Saunders Revel  Subjective   Doing well today.  Tolerating sotalol but remains in afib.  Inpatient Medications    Scheduled Meds: . acetaminophen  1,000 mg Oral BID  . apixaban  5 mg Oral BID  . canagliflozin  100 mg Oral QAC breakfast  . cholecalciferol  1,000 Units Oral Daily  . furosemide  20 mg Oral Daily  . insulin aspart  0-20 Units Subcutaneous TID WC  . insulin aspart  14 Units Subcutaneous TID WC  . insulin glargine  56 Units Subcutaneous Daily  . metFORMIN  1,000 mg Oral BID WC  . metoprolol tartrate  50 mg Oral QID  . multivitamin with minerals  1 tablet Oral Daily  . pantoprazole  40 mg Oral Daily  . potassium chloride SA  20 mEq Oral BID  . simvastatin  20 mg Oral q1800  . sodium chloride flush  3 mL Intravenous Q12H  . sotalol  120 mg Oral Q12H  . triamcinolone cream  1 application Topical BID   Continuous Infusions: . sodium chloride     PRN Meds: sodium chloride, acetaminophen, sodium chloride flush   Vital Signs    Vitals:   07/20/16 1444 07/20/16 2111 07/21/16 0604  BP: (!) 148/97 (!) 133/94 122/73  Pulse: (!) 145 (!) 144   Resp:  18 18  Temp: 97.8 F (36.6 C) 98 F (36.7 C) 98 F (36.7 C)  TempSrc: Oral Oral Oral  SpO2: 97% 98% 100%  Weight: (!) 338 lb 3 oz (153.4 kg)    Height: 5\' 11"  (1.803 m)      Intake/Output Summary (Last 24 hours) at 07/21/16 0641 Last data filed at 07/20/16 1800  Gross per 24 hour  Intake              240 ml  Output                0 ml  Net              240 ml   Filed Weights   07/20/16 1444  Weight: (!) 338 lb 3 oz (153.4 kg)    Telemetry    Afib/ atypical atrial flutter - Personally Reviewed  ECG    Atypical AFlutter, RVR, 141bpm, QT difficult with rhythm, is stable - Personally Reviewed  Physical Exam   awake GEN: No acute distress.   Neck: No JVD Cardiac: iRRR, no murmurs, rubs, or gallops.    Respiratory:  Clear to auscultation bilaterally. GI: Soft, nontender, non-distended  MS: No edema; No deformity. Neuro:  Nonfocal  Psych: Normal affect   Labs    Chemistry Recent Labs Lab 07/20/16 0918 07/21/16 0317  NA 138 139  K 4.1 3.9  CL 104 107  CO2 24 23  GLUCOSE 182* 107*  BUN 16 18  CREATININE 0.88 0.83  CALCIUM 9.5 8.9  GFRNONAA >60 >60  GFRAA >60 >60  ANIONGAP 10 9     Hematology Recent Labs Lab 07/20/16 0918  WBC 5.9  RBC 5.08  HGB 14.9  HCT 46.5  MCV 91.5  MCH 29.3  MCHC 32.0  RDW 14.5  PLT 176     Radiology    No results found.  Cardiac Studies   02/04/16: TTE Study Conclusions - Left ventricle: The cavity size was normal. There was mild   concentric hypertrophy. Systolic function was vigorous. The   estimated ejection fraction was  in the range of 65% to 70%. Wall   motion was normal; there were no regional wall motion   abnormalities. Left ventricular diastolic function parameters   were normal. - Aortic valve: Transvalvular velocity was within the normal range.   There was no stenosis. There was mild regurgitation.   Regurgitation pressure half-time: 517 ms. - Mitral valve: Transvalvular velocity was within the normal range.   There was no evidence for stenosis. There was trivial   regurgitation. - Left atrium: The appendage was severely dilated. - Right ventricle: The cavity size was normal. Wall thickness was   normal. Systolic function was normal. - Tricuspid valve: There was mild regurgitation. - Pulmonic valve: There was no regurgitation. - Pulmonary arteries: Systolic pressure was within the normal   range. PA peak pressure: 26 mm Hg (S).  Patient Profile     66 y.o. male w/persistent AFib, atypical AFlutter, HTN, DM, obesity, CBP, here for Sotalol initiation.  Assessment & Plan    1. Paroxysmal AFib on Eliquis     K+ 3.9     Mag 2.0     Creat 0.83     QTc stable     DCCV tomorrow if not in SR        2. DM      Appreciate DM RN assistance  3. HTN  Stable No change required today   Signed, Baldwin Jamaica, PA-C  07/21/2016, 6:41 AM     I have seen, examined the patient, and reviewed the above assessment and plan.  On exam, iRRR.  Continue sotalol load.  Plan for cardioversion tomorrow.  NPO for cardioversion tomorrow if he does not convert to sinus in the interim.  Changes to above are made where necessary.  Anticipate discharge to home on Friday.  Follow-up in AF clinic in 1 week for labs/ ekg.  Follow-up with me in 4 weeks.  Co Sign: [name], MD 07/21/2016 8:41 AM

## 2016-07-21 NOTE — Progress Notes (Signed)
Inpatient Diabetes Program Recommendations  AACE/ADA: New Consensus Statement on Inpatient Glycemic Control (2015)  Target Ranges:  Prepandial:   less than 140 mg/dL      Peak postprandial:   less than 180 mg/dL (1-2 hours)      Critically ill patients:  140 - 180 mg/dL   Lab Results  Component Value Date   GLUCAP 123 (H) 07/21/2016   HGBA1C 7.1 03/23/2016   Review of Glycemic Control  Diabetes history: DM 2 Outpatient Diabetes medications: Jardiance 25 mg Daily, Metformin 1000 mg BID, Victoza 0.3 mg Daily, Toujeo 56 units, Novolog 14 units tid with meals Current orders for Inpatient glycemic control: Invokana 100 mg Daily, Metformin 1000 mg BID, Victoza 0.3 mg Daily, Toujeo 56 units, Novolog Resistant Correction 0-20 units tid + Novolog 14 units tid meal coverage  Referral for Inpatient DM Management  Patient trends 182 mg/dl and below since admission within inpatient goal. Will follow trends while inpatient. Agree with current regimen.  Thanks,  Tama Headings RN, MSN, [facility] Inpatient Diabetes Coordinator Team Pager [phone] (8a-5p)

## 2016-07-22 ENCOUNTER — Inpatient Hospital Stay (HOSPITAL_COMMUNITY): Payer: Medicare Other | Admitting: Anesthesiology

## 2016-07-22 ENCOUNTER — Encounter (HOSPITAL_COMMUNITY): Payer: Self-pay | Admitting: Anesthesiology

## 2016-07-22 ENCOUNTER — Encounter (HOSPITAL_COMMUNITY)
Admission: AD | Disposition: A | Discharge status: Home / Self Care | Payer: Self-pay | Source: Ambulatory Visit | Attending: Internal Medicine

## 2016-07-22 DIAGNOSIS — I4891 Unspecified atrial fibrillation: Secondary | ICD-10-CM

## 2016-07-22 HISTORY — PX: CARDIOVERSION: SHX1299

## 2016-07-22 LAB — BASIC METABOLIC PANEL
Anion gap: 11 (ref 5–15)
BUN: 19 mg/dL (ref 6–20)
CO2: 22 mmol/L (ref 22–32)
Calcium: 9.2 mg/dL (ref 8.9–10.3)
Chloride: 105 mmol/L (ref 101–111)
Creatinine, Ser: 0.73 mg/dL (ref 0.61–1.24)
GFR calc Af Amer: >60 mL/min (ref >60)
GFR calc non Af Amer: >60 mL/min (ref >60)
Glucose, Bld: 133 mg/dL — ABNORMAL HIGH (ref 65–99)
Potassium: 3.4 mmol/L — ABNORMAL LOW (ref 3.5–5.1)
Sodium: 138 mmol/L (ref 135–145)

## 2016-07-22 LAB — GLUCOSE, CAPILLARY
Glucose-Capillary: 145 mg/dL — ABNORMAL HIGH (ref 65–99)
Glucose-Capillary: 153 mg/dL — ABNORMAL HIGH (ref 65–99)

## 2016-07-22 LAB — MAGNESIUM: Magnesium: 1.8 mg/dL (ref 1.7–2.4)

## 2016-07-22 SURGERY — CARDIOVERSION
Anesthesia: General

## 2016-07-22 MED ORDER — SOTALOL HCL 120 MG PO TABS: 120.0000 mg | ORAL_TABLET | Freq: Two times a day (BID) | ORAL | 6 refills | Status: DC

## 2016-07-22 MED ORDER — PROPOFOL 10 MG/ML IV BOLUS
INTRAVENOUS | Status: DC | PRN
  Administered 2016-07-22: 70 mg via INTRAVENOUS

## 2016-07-22 MED ORDER — POTASSIUM CHLORIDE CRYS ER 20 MEQ PO TBCR
20.0000 meq | EXTENDED_RELEASE_TABLET | Freq: Once | ORAL | Status: AC
  Administered 2016-07-22: 20 meq via ORAL
  Filled 2016-07-22: qty 1

## 2016-07-22 MED ORDER — LIDOCAINE 2% (20 MG/ML) 5 ML SYRINGE
INTRAMUSCULAR | Status: DC | PRN
  Administered 2016-07-22: 50 mg via INTRAVENOUS

## 2016-07-22 NOTE — CV Procedure (Signed)
    Cardioversion Note  Lawrence Jordan [id number] 1950/12/11  Procedure: DC Cardioversion Indications: atrial fibrillation  Procedure Details Consent: Obtained Time Out: Verified patient identification, verified procedure, site/side was marked, verified correct patient position, special equipment/implants available, Radiology Safety Procedures followed,  medications/allergies/relevent history reviewed, required imaging and test results available.  Performed  The patient has been on adequate anticoagulation.  The patient received IV Propofol administered by anesthesia staff for  sedation.  Synchronous cardioversion was performed at 120 joules.  The cardioversion was successful   Complications: No apparent complications Patient did tolerate procedure well.   Lawrence Dawley, MD, [facility] 07/22/2016, 1:04 PM

## 2016-07-22 NOTE — Transfer of Care (Signed)
Immediate Anesthesia Transfer of Care Note  Patient: Lawrence Jordan  Procedure(s) Performed: Procedure(s): CARDIOVERSION (N/A)  Patient Location: Endoscopy Unit  Anesthesia Type:MAC  Level of Consciousness: awake, alert , oriented and patient cooperative  Airway & Oxygen Therapy: Patient Spontanous Breathing and Patient connected to nasal cannula oxygen  Post-op Assessment: Report given to RN and Post -op Vital signs reviewed and stable  Post vital signs: Reviewed and stable  Last Vitals:  Vitals:   07/22/16 1251 07/22/16 1252  BP:    Pulse: (!) 51 (!) 52  Resp: (!) 27 (!) 25  Temp:      Last Pain:  Vitals:   07/22/16 1230  TempSrc: Oral  PainSc:          Complications: No apparent anesthesia complications

## 2016-07-22 NOTE — Anesthesia Postprocedure Evaluation (Signed)
Anesthesia Post Note  Patient: Lawrence Jordan  Procedure(s) Performed: Procedure(s) (LRB): CARDIOVERSION (N/A)     Patient location during evaluation: PACU Anesthesia Type: General Level of consciousness: awake and alert Pain management: pain level controlled Vital Signs Assessment: post-procedure vital signs reviewed and stable Respiratory status: spontaneous breathing, nonlabored ventilation, respiratory function stable and patient connected to nasal cannula oxygen Cardiovascular status: blood pressure returned to baseline and stable Postop Assessment: no signs of nausea or vomiting Anesthetic complications: no    Last Vitals:  Vitals:   07/22/16 1310 07/22/16 1325  BP: (!) 83/57 95/76  Pulse: (!) 54 (!) 52  Resp: (!) 21 18  Temp:      Last Pain:  Vitals:   07/22/16 1256  TempSrc: Oral  PainSc:                  Effie Berkshire

## 2016-07-22 NOTE — H&P (View-Only) (Signed)
Progress Note  Patient Name: Lawrence Jordan Date of Encounter: 07/21/2016  Primary Cardiologist: Dr. Saunders Revel  Subjective   Doing well today.  Tolerating sotalol but remains in afib.  Inpatient Medications    Scheduled Meds: . acetaminophen  1,000 mg Oral BID  . apixaban  5 mg Oral BID  . canagliflozin  100 mg Oral QAC breakfast  . cholecalciferol  1,000 Units Oral Daily  . furosemide  20 mg Oral Daily  . insulin aspart  0-20 Units Subcutaneous TID WC  . insulin aspart  14 Units Subcutaneous TID WC  . insulin glargine  56 Units Subcutaneous Daily  . metFORMIN  1,000 mg Oral BID WC  . metoprolol tartrate  50 mg Oral QID  . multivitamin with minerals  1 tablet Oral Daily  . pantoprazole  40 mg Oral Daily  . potassium chloride SA  20 mEq Oral BID  . simvastatin  20 mg Oral q1800  . sodium chloride flush  3 mL Intravenous Q12H  . sotalol  120 mg Oral Q12H  . triamcinolone cream  1 application Topical BID   Continuous Infusions: . sodium chloride     PRN Meds: sodium chloride, acetaminophen, sodium chloride flush   Vital Signs    Vitals:   07/20/16 1444 07/20/16 2111 07/21/16 0604  BP: (!) 148/97 (!) 133/94 122/73  Pulse: (!) 145 (!) 144   Resp:  18 18  Temp: 97.8 F (36.6 C) 98 F (36.7 C) 98 F (36.7 C)  TempSrc: Oral Oral Oral  SpO2: 97% 98% 100%  Weight: (!) 338 lb 3 oz (153.4 kg)    Height: 5\' 11"  (1.803 m)      Intake/Output Summary (Last 24 hours) at 07/21/16 0641 Last data filed at 07/20/16 1800  Gross per 24 hour  Intake              240 ml  Output                0 ml  Net              240 ml   Filed Weights   07/20/16 1444  Weight: (!) 338 lb 3 oz (153.4 kg)    Telemetry    Afib/ atypical atrial flutter - Personally Reviewed  ECG    Atypical AFlutter, RVR, 141bpm, QT difficult with rhythm, is stable - Personally Reviewed  Physical Exam   awake GEN: No acute distress.   Neck: No JVD Cardiac: iRRR, no murmurs, rubs, or gallops.    Respiratory:  Clear to auscultation bilaterally. GI: Soft, nontender, non-distended  MS: No edema; No deformity. Neuro:  Nonfocal  Psych: Normal affect   Labs    Chemistry Recent Labs Lab 07/20/16 0918 07/21/16 0317  NA 138 139  K 4.1 3.9  CL 104 107  CO2 24 23  GLUCOSE 182* 107*  BUN 16 18  CREATININE 0.88 0.83  CALCIUM 9.5 8.9  GFRNONAA >60 >60  GFRAA >60 >60  ANIONGAP 10 9     Hematology Recent Labs Lab 07/20/16 0918  WBC 5.9  RBC 5.08  HGB 14.9  HCT 46.5  MCV 91.5  MCH 29.3  MCHC 32.0  RDW 14.5  PLT 176     Radiology    No results found.  Cardiac Studies   02/04/16: TTE Study Conclusions - Left ventricle: The cavity size was normal. There was mild   concentric hypertrophy. Systolic function was vigorous. The   estimated ejection fraction was  in the range of 65% to 70%. Wall   motion was normal; there were no regional wall motion   abnormalities. Left ventricular diastolic function parameters   were normal. - Aortic valve: Transvalvular velocity was within the normal range.   There was no stenosis. There was mild regurgitation.   Regurgitation pressure half-time: 517 ms. - Mitral valve: Transvalvular velocity was within the normal range.   There was no evidence for stenosis. There was trivial   regurgitation. - Left atrium: The appendage was severely dilated. - Right ventricle: The cavity size was normal. Wall thickness was   normal. Systolic function was normal. - Tricuspid valve: There was mild regurgitation. - Pulmonic valve: There was no regurgitation. - Pulmonary arteries: Systolic pressure was within the normal   range. PA peak pressure: 26 mm Hg (S).  Patient Profile     66 y.o. male w/persistent AFib, atypical AFlutter, HTN, DM, obesity, CBP, here for Sotalol initiation.  Assessment & Plan    1. Paroxysmal AFib on Eliquis     K+ 3.9     Mag 2.0     Creat 0.83     QTc stable     DCCV tomorrow if not in SR        2. DM      Appreciate DM RN assistance  3. HTN  Stable No change required today   Signed, Baldwin Jamaica, PA-C  07/21/2016, 6:41 AM     I have seen, examined the patient, and reviewed the above assessment and plan.  On exam, iRRR.  Continue sotalol load.  Plan for cardioversion tomorrow.  NPO for cardioversion tomorrow if he does not convert to sinus in the interim.  Changes to above are made where necessary.  Anticipate discharge to home on Friday.  Follow-up in AF clinic in 1 week for labs/ ekg.  Follow-up with me in 4 weeks.  Co Sign: [name], MD 07/21/2016 8:41 AM

## 2016-07-22 NOTE — Anesthesia Preprocedure Evaluation (Addendum)
Anesthesia Evaluation  Patient identified by MRN, date of birth, ID band Patient awake    Reviewed: Allergy & Precautions, NPO status , Patient's Chart, lab work & pertinent test results, reviewed documented beta blocker date and time   Airway Mallampati: III       Dental  (+) Teeth Intact, Dental Advisory Given   Pulmonary sleep apnea and Continuous Positive Airway Pressure Ventilation ,    Pulmonary exam normal        Cardiovascular hypertension, Pt. on home beta blockers and Pt. on medications + CAD   Rhythm:Irregular Rate:Abnormal     Neuro/Psych negative neurological ROS  negative psych ROS   GI/Hepatic GERD  Medicated,  Endo/Other  diabetes, Type 2, Insulin Dependent, Oral Hypoglycemic Agents  Renal/GU   negative genitourinary   Musculoskeletal  (+) Arthritis ,   Abdominal   Peds negative pediatric ROS (+)  Hematology negative hematology ROS (+)   Anesthesia Other Findings - HLD  Reproductive/Obstetrics negative OB ROS                            Echo - Left ventricle: The cavity size was normal. There was mild   concentric hypertrophy. Systolic function was vigorous. The   estimated ejection fraction was in the range of 65% to 70%. Wall   motion was normal; there were no regional wall motion   abnormalities. Left ventricular diastolic function parameters   were normal. - Aortic valve: Transvalvular velocity was within the normal range.   There was no stenosis. There was mild regurgitation.   Regurgitation pressure half-time: 517 ms. - Mitral valve: Transvalvular velocity was within the normal range.   There was no evidence for stenosis. There was trivial   regurgitation. - Left atrium: The appendage was severely dilated. - Right ventricle: The cavity size was normal. Wall thickness was   normal. Systolic function was normal. - Tricuspid valve: There was mild regurgitation. -  Pulmonic valve: There was no regurgitation. - Pulmonary arteries: Systolic pressure was within the normal   range. PA peak pressure: 26 mm Hg (S).  Anesthesia Physical Anesthesia Plan  ASA: III  Anesthesia Plan: General   Post-op Pain Management:    Induction: Intravenous  PONV Risk Score and Plan: Propofol  Airway Management Planned: Mask  Additional Equipment:   Intra-op Plan:   Post-operative Plan:   Informed Consent: I have reviewed the patients History and Physical, chart, labs and discussed the procedure including the risks, benefits and alternatives for the proposed anesthesia with the patient or authorized representative who has indicated his/her understanding and acceptance.     Plan Discussed with: CRNA  Anesthesia Plan Comments:         Anesthesia Quick Evaluation

## 2016-07-22 NOTE — Interval H&P Note (Signed)
History and Physical Interval Note:  07/22/2016 12:18 PM  Lawrence Jordan  has presented today for surgery, with the diagnosis of afib  The various methods of treatment have been discussed with the patient and family. After consideration of risks, benefits and other options for treatment, the patient has consented to  Procedure(s): CARDIOVERSION (N/A) as a surgical intervention .  The patient's history has been reviewed, patient examined, no change in status, stable for surgery.  I have reviewed the patient's chart and labs.  Questions were answered to the patient's satisfaction.     [name]

## 2016-07-22 NOTE — Discharge Summary (Signed)
ELECTROPHYSIOLOGY PROCEDURE DISCHARGE SUMMARY    Patient ID: Lawrence Jordan,  MRN: [id number], DOB/AGE: 1951-01-19 66 y.o.  Admit date: 07/20/2016 Discharge date: 07/22/2016  Primary Care Physician: [name], MD  Primary Cardiologist: Dr. Fletcher [name] Electrophysiologist: Dr. Dr. [name]  Primary Discharge Diagnosis:  1.  Persistent atrial fibrillation status post Sotalol loading this admission      CHA2DS2Vasc is 3, on Eliquis  Secondary Discharge Diagnosis:  1. HTN 2. Obesity 3. OSA     W/CPAP 4. DM 5. HTN 6. CBP  Allergies  Allergen Reactions  . Iodinated Diagnostic Agents Anaphylaxis  . Sulfa Antibiotics Anaphylaxis and Rash  . Adhesive [Tape] Other (See Comments)    Blisters PAPER TAPE ONLY  . Amoxicillin Hives     Procedures This Admission:  1.  TSotalol loading 2.  Direct current cardioversion on 07/22/16 by Dr Meda Coffee which successfully restored SR.  There were no early apparent complications.   Brief HPI: Lawrence Jordan is a 66 y.o. male with a past medical history as noted above.  The patient follows with the AFib clinic and Dr. [name] in the outpatient treatment of his AFib.  Risks, benefits, and alternatives to Sotalol were reviewed with the patient who wished to proceed.    Hospital Course:  The patient was admitted and Sotalol was initiated.  Renal function and electrolytes were followed during the hospitalization.  Their QTc remained stable.  On 07/22/16 they underwent direct current cardioversion which restored sinus rhythm.  He was monitored until discharge on telemetry which demonstrated SR s/p DCCV.  On the day of discharge, he was examined by Dr Lovena Le who considered him stable for discharge to home post DCCV if QTc remained stable.  S/p DCCV in SR QTc is 482ms, telemetry is SR 60's.  Patient feels well, will discharge.  Follow-up has been arranged with the Afib cinic in 1 week and with Dr [name] in 4 weeks.   Physical Exam: Vitals:   07/22/16  1256 07/22/16 1310 07/22/16 1325 07/22/16 1424  BP: (!) 82/55 (!) 83/57 95/76 112/78  Pulse: (!) 53 (!) 54 (!) 52 60  Resp: (!) 26 (!) 21 18 20   Temp: 97.9 F (36.6 C)     TempSrc: Oral     SpO2: 95% 95% 95% 100%  Weight:      Height:         Labs:   Lab Results  Component Value Date   WBC 5.9 07/20/2016   HGB 14.9 07/20/2016   HCT 46.5 07/20/2016   MCV 91.5 07/20/2016   PLT 176 07/20/2016     Recent Labs Lab 07/22/16 0308  NA 138  K 3.4*  CL 105  CO2 22  BUN 19  CREATININE 0.73  CALCIUM 9.2  GLUCOSE 133*     Discharge Medications:  Allergies as of 07/22/2016      Reactions   Iodinated Diagnostic Agents Anaphylaxis   Sulfa Antibiotics Anaphylaxis, Rash   Adhesive [tape] Other (See Comments)   Blisters PAPER TAPE ONLY   Amoxicillin Hives      Medication List    TAKE these medications   acetaminophen 500 MG tablet Commonly known as:  TYLENOL Take 1,000 mg by mouth 2 (two) times daily.   apixaban 5 MG Tabs tablet Commonly known as:  ELIQUIS TAKE 1 TABLET (5 MG TOTAL) BY MOUTH 2 (TWO) TIMES DAILY.   B-D UF III MINI PEN NEEDLES 31G X 5 MM Misc Generic drug:  Insulin Pen Needle  USE FOUR TIMES DAILY  TO  INJECT  INSULIN   cholecalciferol 1000 units tablet Commonly known as:  VITAMIN D Take 1,000 Units by mouth daily.   empagliflozin 25 MG Tabs tablet Commonly known as:  JARDIANCE Take 25 mg by mouth daily.   furosemide 40 MG tablet Commonly known as:  LASIX Take 0.5 tablets (20 mg total) by mouth daily.   glucose blood test strip Commonly known as:  ACCU-CHEK ACTIVE STRIPS TEST BLOOD SUGAR TWICE A DAY AS DIRECTED   insulin aspart 100 UNIT/ML FlexPen Commonly known as:  NOVOLOG Inject 14 Units into the skin 3 (three) times daily with meals.   liraglutide 18 MG/3ML Sopn Commonly known as:  VICTOZA INJECT 0.3 MLS (1.8 MG TOTAL) INTO THE SKIN DAILY.   metFORMIN 500 MG 24 hr tablet Commonly known as:  GLUCOPHAGE-XR 2 tabs with breakfast 2  tabs with dinner   metoprolol tartrate 50 MG tablet Commonly known as:  LOPRESSOR Take 1 tablet (50 mg total) by mouth 4 (four) times daily.   multivitamin tablet Take 1 tablet by mouth daily.   NON FORMULARY CPAP   ONETOUCH DELICA LANCETS FINE Misc USE TO CHECK BLOOD SUGAR 2 TIMES PER DAY dx code E11.9   pantoprazole 40 MG tablet Commonly known as:  PROTONIX Take 1 tablet (40 mg total) by mouth daily.   potassium chloride SA 20 MEQ tablet Commonly known as:  KLOR-CON M20 Take 1 tablet (20 mEq total) by mouth 2 (two) times daily.   simvastatin 20 MG tablet Commonly known as:  ZOCOR Take 1 tablet (20 mg total) by mouth daily at 6 PM.   sotalol 120 MG tablet Commonly known as:  BETAPACE Take 1 tablet (120 mg total) by mouth 2 (two) times daily.   TOUJEO SOLOSTAR 300 UNIT/ML Sopn Generic drug:  Insulin Glargine Inject 56 Units into the skin daily.   triamcinolone cream 0.1 % Commonly known as:  KENALOG Apply 1 application topically as directed.       Disposition: Home Discharge Instructions    Diet - low sodium heart healthy    Complete by:  As directed    Increase activity slowly    Complete by:  As directed      Follow-up Information    [facility] Follow up on 07/29/2016.   Specialty:  Cardiology Why:  11:00AM Contact information: [address] [id number] [place] [phone]       [name], MD Follow up on 08/23/2016.   Specialty:  Cardiology Why:  9:00AM Contact information: [place] [address] [phone]           Duration of Discharge Encounter: Greater than 30 minutes including physician time.  Venetia Night, PA-C 07/22/2016 2:35 PM  EP Attending  Patient seen and examined. He is doing well today and appears to be stable for DC home. followup as outlined above.  Mikle Bosworth.D.

## 2016-07-22 NOTE — Progress Notes (Signed)
07/22/2016 3:06 PM Discharge AVS meds taken today and those due this evening reviewed.  Follow-up appointments and when to call md reviewed.  D/C IV and TELE.  Questions and concerns addressed.   D/C home per orders. [place]

## 2016-07-22 NOTE — Progress Notes (Signed)
Progress Note  Patient Name: Lawrence Jordan Date of Encounter: 07/22/2016  Primary Cardiologist: Dr. Saunders Revel  Subjective   Doing well today.  Tolerating sotalol but remains in afib, agreeable with plan for DCCV today  Inpatient Medications    Scheduled Meds: . acetaminophen  1,000 mg Oral BID  . apixaban  5 mg Oral BID  . canagliflozin  100 mg Oral QAC breakfast  . cholecalciferol  1,000 Units Oral Daily  . furosemide  20 mg Oral Daily  . insulin aspart  0-20 Units Subcutaneous TID WC  . insulin aspart  14 Units Subcutaneous TID WC  . insulin glargine  56 Units Subcutaneous Daily  . metFORMIN  1,000 mg Oral BID WC  . metoprolol tartrate  50 mg Oral QID  . multivitamin with minerals  1 tablet Oral Daily  . pantoprazole  40 mg Oral Daily  . potassium chloride SA  20 mEq Oral BID  . simvastatin  20 mg Oral q1800  . sodium chloride flush  3 mL Intravenous Q12H  . sodium chloride flush  3 mL Intravenous Q12H  . sotalol  120 mg Oral Q12H  . triamcinolone cream  1 application Topical BID   Continuous Infusions: . sodium chloride    . sodium chloride     PRN Meds: sodium chloride, acetaminophen, sodium chloride flush, sodium chloride flush   Vital Signs    Vitals:   07/21/16 1330 07/21/16 1727 07/21/16 2108 07/22/16 0545  BP: 124/72 114/76 96/76 101/76  Pulse: (!) 107 (!) 118 70 64  Resp: 20  18 18   Temp: 97.8 F (36.6 C)  98.1 F (36.7 C) 98 F (36.7 C)  TempSrc: Oral  Oral Oral  SpO2: 98%  98% 97%  Weight:      Height:        Intake/Output Summary (Last 24 hours) at 07/22/16 0488 Last data filed at 07/21/16 2109  Gross per 24 hour  Intake              723 ml  Output                0 ml  Net              723 ml   Filed Weights   07/20/16 1444  Weight: (!) 338 lb 3 oz (153.4 kg)    Telemetry    Afib/ atypical atrial flutter 110- 120 range- Personally Reviewed  ECG    Atypical AFlutter, RVR, QTC stable Personally Reviewed with Dr. Lovena Le  Physical  Exam   awake GEN: No acute distress.   Neck: No JVD Cardiac: iRRR, no murmurs, rubs, or gallops.  Respiratory:  Clear to auscultation bilaterally. GI: Soft, nontender, non-distended  MS: No edema; No deformity. Neuro:  Nonfocal  Psych: Normal affect   Labs    Chemistry  Recent Labs Lab 07/20/16 0918 07/21/16 0317 07/22/16 0308  NA 138 139 138  K 4.1 3.9 3.4*  CL 104 107 105  CO2 24 23 22   GLUCOSE 182* 107* 133*  BUN 16 18 19   CREATININE 0.88 0.83 0.73  CALCIUM 9.5 8.9 9.2  GFRNONAA >60 >60 >60  GFRAA >60 >60 >60  ANIONGAP 10 9 11      Hematology  Recent Labs Lab 07/20/16 0918  WBC 5.9  RBC 5.08  HGB 14.9  HCT 46.5  MCV 91.5  MCH 29.3  MCHC 32.0  RDW 14.5  PLT 176     Radiology    No  results found.  Cardiac Studies   02/04/16: TTE Study Conclusions - Left ventricle: The cavity size was normal. There was mild   concentric hypertrophy. Systolic function was vigorous. The   estimated ejection fraction was in the range of 65% to 70%. Wall   motion was normal; there were no regional wall motion   abnormalities. Left ventricular diastolic function parameters   were normal. - Aortic valve: Transvalvular velocity was within the normal range.   There was no stenosis. There was mild regurgitation.   Regurgitation pressure half-time: 517 ms. - Mitral valve: Transvalvular velocity was within the normal range.   There was no evidence for stenosis. There was trivial   regurgitation. - Left atrium: The appendage was severely dilated. - Right ventricle: The cavity size was normal. Wall thickness was   normal. Systolic function was normal. - Tricuspid valve: There was mild regurgitation. - Pulmonic valve: There was no regurgitation. - Pulmonary arteries: Systolic pressure was within the normal   range. PA peak pressure: 26 mm Hg (S).  Patient Profile     66 y.o. male w/persistent AFib, atypical AFlutter, HTN, DM, obesity, CBP, here for Sotalol  initiation.  Assessment & Plan    1. Paroxysmal AFib on Eliquis     K+ 3.4, additional replacement ordered     Mag 1.8     Creat 0.73     QTc stable     DCCV today     Anticipate discharge post dccv today if QTc remains OK in SR        2. DM     Appreciate DM RN assistance     No changes recommended  3. HTN     Stable      No change required today   Signed, Baldwin [place], PA-C  07/22/2016, 9:26 AM    EP Attending  Patient seen and examined. He appears to be tolerating his sotalol with out difficulty. His QT is a bit increased but acceptable. He will undergo DCCV later today. He may go home several hours later but no driving for 36 hours.   Mikle Bosworth.D.

## 2016-07-22 NOTE — Care Management Note (Signed)
Case Management Note Marvetta Gibbons RN, BSN Unit 2W-Case Manager [phone]  Patient Details  Name: Lawrence Jordan MRN: [id number] Date of Birth: Apr 25, 1950  Subjective/Objective:   Pt admitted with afib/flutter  - started on sotalol               Action/Plan: PTA pt lived at home with spouse- plan to return home- no CM needs noted  Expected Discharge Date:  07/22/16               Expected Discharge Plan:  Home/Self Care  In-House Referral:     Discharge planning Services  CM Consult  Post Acute Care Choice:  NA Choice offered to:  NA  DME Arranged:    DME Agency:     HH Arranged:    [place] Agency:     Status of Service:  Completed, signed off  If discussed at [place] of Stay Meetings, dates discussed:    Discharge Disposition: home/self care   Additional Comments:  Dawayne Patricia, RN 07/22/2016, 1:37 PM

## 2016-07-23 ENCOUNTER — Telehealth: Payer: Self-pay | Admitting: Internal Medicine

## 2016-07-23 NOTE — Telephone Encounter (Signed)
Called, spoke with pt. Pt stated he had recent Sotalol hospital admission and cardioversion on 07/22/16. Pt stated he feels like his breathing is labored and he has a tightness and pain in his back on the right side. Symptoms started yesterday. Pt stated he has a hx of back pain. BP today 111/78, HR 60s, O2 Sat 95%. Informed Dr. [name] and DOD are not available to advise. Informed if breathing remains labored, pt should report to the emergency department. Reminded we have on call coverage when our office is closed.Informed is pt still not feeling well on Monday, call our office to schedule an appt. Pt verbalized understanding.

## 2016-07-23 NOTE — Telephone Encounter (Signed)
New message  -phoned triage 3x  Pt states that he was released from [facility] yesterday and is having slight trouble breathing today.   Pt c/o Shortness Of Breath: STAT if SOB developed within the last 24 hours or pt is noticeably SOB on the phone  1. Are you currently SOB (can you hear that pt is SOB on the phone)? no  2. How long have you been experiencing SOB? Since pt got out of the hospital yesterday  3. Are you SOB when sitting or when up moving around? Both occasionally  4. Are you currently experiencing any other symptoms? no

## 2016-07-25 ENCOUNTER — Encounter (HOSPITAL_COMMUNITY): Payer: Self-pay | Admitting: Cardiology

## 2016-07-26 NOTE — Telephone Encounter (Signed)
Left message to call back  

## 2016-07-27 ENCOUNTER — Ambulatory Visit (INDEPENDENT_AMBULATORY_CARE_PROVIDER_SITE_OTHER): Payer: Medicare Other | Admitting: Endocrinology

## 2016-07-27 ENCOUNTER — Ambulatory Visit (HOSPITAL_COMMUNITY)
Admission: RE | Admit: 2016-07-27 | Discharge: 2016-07-27 | Disposition: A | Discharge status: Home / Self Care | Payer: Medicare Other | Source: Ambulatory Visit | Attending: Nurse Practitioner | Admitting: Nurse Practitioner

## 2016-07-27 ENCOUNTER — Encounter (HOSPITAL_COMMUNITY): Payer: Self-pay | Admitting: Nurse Practitioner

## 2016-07-27 ENCOUNTER — Encounter: Payer: Self-pay | Admitting: Endocrinology

## 2016-07-27 VITALS — BP 134/84 | HR 55 | Ht 71.0 in | Wt 337.8 lb

## 2016-07-27 VITALS — BP 136/88 | HR 56 | Ht 71.0 in | Wt 337.6 lb

## 2016-07-27 DIAGNOSIS — Z9889 Other specified postprocedural states: Secondary | ICD-10-CM | POA: Diagnosis not present

## 2016-07-27 DIAGNOSIS — R918 Other nonspecific abnormal finding of lung field: Secondary | ICD-10-CM | POA: Diagnosis not present

## 2016-07-27 DIAGNOSIS — M1610 Unilateral primary osteoarthritis, unspecified hip: Secondary | ICD-10-CM | POA: Diagnosis not present

## 2016-07-27 DIAGNOSIS — I517 Cardiomegaly: Secondary | ICD-10-CM | POA: Insufficient documentation

## 2016-07-27 DIAGNOSIS — Z880 Allergy status to penicillin: Secondary | ICD-10-CM | POA: Insufficient documentation

## 2016-07-27 DIAGNOSIS — I2510 Atherosclerotic heart disease of native coronary artery without angina pectoris: Secondary | ICD-10-CM | POA: Diagnosis not present

## 2016-07-27 DIAGNOSIS — G4733 Obstructive sleep apnea (adult) (pediatric): Secondary | ICD-10-CM | POA: Diagnosis not present

## 2016-07-27 DIAGNOSIS — R05 Cough: Secondary | ICD-10-CM | POA: Diagnosis not present

## 2016-07-27 DIAGNOSIS — I10 Essential (primary) hypertension: Secondary | ICD-10-CM | POA: Insufficient documentation

## 2016-07-27 DIAGNOSIS — E7800 Pure hypercholesterolemia, unspecified: Secondary | ICD-10-CM | POA: Insufficient documentation

## 2016-07-27 DIAGNOSIS — E782 Mixed hyperlipidemia: Secondary | ICD-10-CM

## 2016-07-27 DIAGNOSIS — E1165 Type 2 diabetes mellitus with hyperglycemia: Secondary | ICD-10-CM | POA: Diagnosis not present

## 2016-07-27 DIAGNOSIS — I481 Persistent atrial fibrillation: Secondary | ICD-10-CM | POA: Insufficient documentation

## 2016-07-27 DIAGNOSIS — I4892 Unspecified atrial flutter: Secondary | ICD-10-CM | POA: Diagnosis not present

## 2016-07-27 DIAGNOSIS — Z79899 Other long term (current) drug therapy: Secondary | ICD-10-CM | POA: Insufficient documentation

## 2016-07-27 DIAGNOSIS — R0600 Dyspnea, unspecified: Secondary | ICD-10-CM

## 2016-07-27 DIAGNOSIS — Z794 Long term (current) use of insulin: Secondary | ICD-10-CM | POA: Insufficient documentation

## 2016-07-27 DIAGNOSIS — M1710 Unilateral primary osteoarthritis, unspecified knee: Secondary | ICD-10-CM | POA: Insufficient documentation

## 2016-07-27 DIAGNOSIS — E119 Type 2 diabetes mellitus without complications: Secondary | ICD-10-CM | POA: Diagnosis not present

## 2016-07-27 DIAGNOSIS — E669 Obesity, unspecified: Secondary | ICD-10-CM | POA: Insufficient documentation

## 2016-07-27 DIAGNOSIS — K219 Gastro-esophageal reflux disease without esophagitis: Secondary | ICD-10-CM | POA: Insufficient documentation

## 2016-07-27 DIAGNOSIS — Z7901 Long term (current) use of anticoagulants: Secondary | ICD-10-CM | POA: Diagnosis not present

## 2016-07-27 DIAGNOSIS — R0602 Shortness of breath: Secondary | ICD-10-CM | POA: Insufficient documentation

## 2016-07-27 LAB — BASIC METABOLIC PANEL
Anion gap: 11 (ref 5–15)
BUN: 16 mg/dL (ref 6–20)
CO2: 24 mmol/L (ref 22–32)
Calcium: 9.9 mg/dL (ref 8.9–10.3)
Chloride: 102 mmol/L (ref 101–111)
Creatinine, Ser: 0.84 mg/dL (ref 0.61–1.24)
GFR calc Af Amer: >60 mL/min (ref >60)
GFR calc non Af Amer: >60 mL/min (ref >60)
Glucose, Bld: 85 mg/dL (ref 65–99)
Potassium: 4.3 mmol/L (ref 3.5–5.1)
Sodium: 137 mmol/L (ref 135–145)

## 2016-07-27 LAB — MAGNESIUM: Magnesium: 1.7 mg/dL (ref 1.7–2.4)

## 2016-07-27 LAB — POCT GLYCOSYLATED HEMOGLOBIN (HGB A1C): Hemoglobin A1C: 6.9

## 2016-07-27 MED ORDER — METOPROLOL TARTRATE 50 MG PO TABS: 50.0000 mg | ORAL_TABLET | Freq: Two times a day (BID) | ORAL | 1 refills | Status: DC

## 2016-07-27 MED ORDER — MAGNESIUM 200 MG PO TABS: 200.0000 mg | ORAL_TABLET | Freq: Every day | ORAL | Status: DC
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
## 2016-07-27 NOTE — Telephone Encounter (Signed)
AF clinic follow-up may be beneficial if symptoms persist Looks like Lawrence Jordan has left him a message.

## 2016-07-27 NOTE — Patient Instructions (Addendum)
If sugar at 10 pm is >180 then 18 Novolog  Toujeo 60 units unless am sugar <100  [email or web address]  Check V-Go cost  ? Jardiance at dinner

## 2016-07-27 NOTE — Telephone Encounter (Signed)
Has appt with afib today at 330pm. Will assess at appointment as patient did not return my call yesterday.
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
## 2016-07-27 NOTE — Patient Instructions (Addendum)
Increase lasix to 40mg  once a day for the next 2 days Decrease metoprolol 50mg  twice a day

## 2016-07-29 ENCOUNTER — Ambulatory Visit (HOSPITAL_COMMUNITY): Payer: Medicare Other | Admitting: Nurse Practitioner

## 2016-08-02 DIAGNOSIS — Z6841 Body Mass Index (BMI) 40.0 and over, adult: Secondary | ICD-10-CM | POA: Diagnosis not present

## 2016-08-02 DIAGNOSIS — I482 Chronic atrial fibrillation: Secondary | ICD-10-CM | POA: Diagnosis not present

## 2016-08-02 DIAGNOSIS — M1990 Unspecified osteoarthritis, unspecified site: Secondary | ICD-10-CM | POA: Diagnosis not present

## 2016-08-02 DIAGNOSIS — G4730 Sleep apnea, unspecified: Secondary | ICD-10-CM | POA: Diagnosis not present

## 2016-08-02 DIAGNOSIS — R109 Unspecified abdominal pain: Secondary | ICD-10-CM | POA: Diagnosis not present

## 2016-08-02 DIAGNOSIS — M7662 Achilles tendinitis, left leg: Secondary | ICD-10-CM | POA: Diagnosis not present

## 2016-08-02 DIAGNOSIS — I10 Essential (primary) hypertension: Secondary | ICD-10-CM | POA: Diagnosis not present

## 2016-08-10 ENCOUNTER — Other Ambulatory Visit: Payer: Self-pay | Admitting: Family Medicine

## 2016-08-10 DIAGNOSIS — M546 Pain in thoracic spine: Secondary | ICD-10-CM

## 2016-08-23 ENCOUNTER — Ambulatory Visit: Payer: Medicare Other | Admitting: Internal Medicine

## 2016-08-26 ENCOUNTER — Ambulatory Visit
Admission: RE | Admit: 2016-08-26 | Discharge: 2016-08-26 | Disposition: A | Discharge status: Home / Self Care | Payer: Medicare Other | Source: Ambulatory Visit | Attending: Family Medicine | Admitting: Family Medicine

## 2016-08-26 DIAGNOSIS — M5124 Other intervertebral disc displacement, thoracic region: Secondary | ICD-10-CM | POA: Diagnosis not present

## 2016-08-26 DIAGNOSIS — M546 Pain in thoracic spine: Secondary | ICD-10-CM

## 2016-08-27 ENCOUNTER — Encounter: Payer: Self-pay | Admitting: Internal Medicine

## 2016-08-27 ENCOUNTER — Ambulatory Visit (INDEPENDENT_AMBULATORY_CARE_PROVIDER_SITE_OTHER): Payer: Medicare Other | Admitting: Internal Medicine

## 2016-08-27 VITALS — BP 120/78 | HR 58 | Ht 71.0 in | Wt 340.0 lb

## 2016-08-27 DIAGNOSIS — G4733 Obstructive sleep apnea (adult) (pediatric): Secondary | ICD-10-CM | POA: Diagnosis not present

## 2016-08-27 DIAGNOSIS — I4892 Unspecified atrial flutter: Secondary | ICD-10-CM | POA: Diagnosis not present

## 2016-08-27 DIAGNOSIS — R0789 Other chest pain: Secondary | ICD-10-CM

## 2016-08-27 DIAGNOSIS — I481 Persistent atrial fibrillation: Secondary | ICD-10-CM

## 2016-08-27 DIAGNOSIS — I10 Essential (primary) hypertension: Secondary | ICD-10-CM | POA: Diagnosis not present

## 2016-08-27 DIAGNOSIS — R0602 Shortness of breath: Secondary | ICD-10-CM | POA: Diagnosis not present

## 2016-08-27 DIAGNOSIS — I4819 Other persistent atrial fibrillation: Secondary | ICD-10-CM

## 2016-08-27 MED ORDER — METOPROLOL TARTRATE 25 MG PO TABS: 25.0000 mg | ORAL_TABLET | Freq: Two times a day (BID) | ORAL | 3 refills | Status: DC

## 2016-08-27 MED ORDER — FUROSEMIDE 40 MG PO TABS: 20.0000 mg | ORAL_TABLET | Freq: Every day | ORAL | 3 refills | Status: DC

## 2016-08-27 NOTE — Patient Instructions (Signed)
Medication Instructions:   Decrease Lopressor to 25mg  twice a day.  Continue all other medications.    Labwork: none  Testing/Procedures: none  Follow-Up: 3 months - [place]  Any Other Special Instructions Will Be Listed Below (If Applicable).  If you need a refill on your cardiac medications before your next appointment, please call your pharmacy.
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
## 2016-09-02 DIAGNOSIS — M545 Low back pain: Secondary | ICD-10-CM | POA: Diagnosis not present

## 2016-09-19 ENCOUNTER — Other Ambulatory Visit: Payer: Self-pay | Admitting: Endocrinology

## 2016-11-19 ENCOUNTER — Other Ambulatory Visit: Payer: Self-pay | Admitting: Endocrinology

## 2016-11-29 ENCOUNTER — Ambulatory Visit (INDEPENDENT_AMBULATORY_CARE_PROVIDER_SITE_OTHER): Payer: Medicare Other | Admitting: Endocrinology

## 2016-11-29 ENCOUNTER — Encounter: Payer: Self-pay | Admitting: Endocrinology

## 2016-11-29 VITALS — BP 142/86 | HR 62 | Ht 71.0 in | Wt 342.0 lb

## 2016-11-29 DIAGNOSIS — Z23 Encounter for immunization: Secondary | ICD-10-CM

## 2016-11-29 DIAGNOSIS — R252 Cramp and spasm: Secondary | ICD-10-CM | POA: Diagnosis not present

## 2016-11-29 DIAGNOSIS — E1165 Type 2 diabetes mellitus with hyperglycemia: Secondary | ICD-10-CM | POA: Diagnosis not present

## 2016-11-29 DIAGNOSIS — I10 Essential (primary) hypertension: Secondary | ICD-10-CM | POA: Diagnosis not present

## 2016-11-29 DIAGNOSIS — Z794 Long term (current) use of insulin: Secondary | ICD-10-CM

## 2016-11-29 DIAGNOSIS — E782 Mixed hyperlipidemia: Secondary | ICD-10-CM

## 2016-11-29 LAB — COMPREHENSIVE METABOLIC PANEL
ALT: 51 U/L (ref 0–53)
AST: 43 U/L — ABNORMAL HIGH (ref 0–37)
Albumin: 4.3 g/dL (ref 3.5–5.2)
Alkaline Phosphatase: 53 U/L (ref 39–117)
BUN: 15 mg/dL (ref 6–23)
CO2: 23 mEq/L (ref 19–32)
Calcium: 9.9 mg/dL (ref 8.4–10.5)
Chloride: 103 mEq/L (ref 96–112)
Creatinine, Ser: 0.82 mg/dL (ref 0.40–1.50)
GFR: 99.92 mL/min (ref >60.00)
Glucose, Bld: 165 mg/dL — ABNORMAL HIGH (ref 70–99)
Potassium: 4.3 mEq/L (ref 3.5–5.1)
Sodium: 137 mEq/L (ref 135–145)
Total Bilirubin: 0.4 mg/dL (ref 0.2–1.2)
Total Protein: 7.2 g/dL (ref 6.0–8.3)

## 2016-11-29 LAB — MAGNESIUM: Magnesium: 1.9 mg/dL (ref 1.5–2.5)

## 2016-11-29 LAB — LIPID PANEL
Cholesterol: 144 mg/dL (ref 0–200)
HDL: 30.7 mg/dL — ABNORMAL LOW (ref >39.00)
NonHDL: 112.89
Total CHOL/HDL Ratio: 5
Triglycerides: 224 mg/dL — ABNORMAL HIGH (ref 0.0–149.0)
VLDL: 44.8 mg/dL — ABNORMAL HIGH (ref 0.0–40.0)

## 2016-11-29 LAB — MICROALBUMIN / CREATININE URINE RATIO
Creatinine,U: 93.1 mg/dL
Microalb Creat Ratio: 9.3 mg/g (ref 0.0–30.0)
Microalb, Ur: 8.6 mg/dL — ABNORMAL HIGH (ref 0.0–1.9)

## 2016-11-29 LAB — POCT GLYCOSYLATED HEMOGLOBIN (HGB A1C): Hemoglobin A1C: 7.1

## 2016-11-29 LAB — LDL CHOLESTEROL, DIRECT: Direct LDL: 85 mg/dL
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
## 2016-11-29 NOTE — Patient Instructions (Signed)
More testing  Healthier diet   Check blood sugars on waking up  3/7  Also check blood sugars about 2 hours after a meal and do this after different meals by rotation  Recommended blood sugar levels on waking up is 90-130 and about 2 hours after meal is 130-160  Please bring your blood sugar monitor to each visit, thank you

## 2016-11-30 ENCOUNTER — Ambulatory Visit: Payer: Medicare Other | Admitting: Endocrinology

## 2016-11-30 ENCOUNTER — Encounter: Payer: Self-pay | Admitting: Endocrinology

## 2016-12-03 ENCOUNTER — Ambulatory Visit (INDEPENDENT_AMBULATORY_CARE_PROVIDER_SITE_OTHER): Payer: Medicare Other | Admitting: Internal Medicine

## 2016-12-03 ENCOUNTER — Encounter: Payer: Self-pay | Admitting: Internal Medicine

## 2016-12-03 VITALS — BP 128/72 | HR 58 | Ht 71.0 in | Wt 342.0 lb

## 2016-12-03 DIAGNOSIS — I10 Essential (primary) hypertension: Secondary | ICD-10-CM

## 2016-12-03 DIAGNOSIS — I4819 Other persistent atrial fibrillation: Secondary | ICD-10-CM

## 2016-12-03 DIAGNOSIS — G4733 Obstructive sleep apnea (adult) (pediatric): Secondary | ICD-10-CM

## 2016-12-03 DIAGNOSIS — I481 Persistent atrial fibrillation: Secondary | ICD-10-CM

## 2016-12-03 NOTE — Patient Instructions (Signed)
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
## 2016-12-30 DIAGNOSIS — E1165 Type 2 diabetes mellitus with hyperglycemia: Secondary | ICD-10-CM | POA: Diagnosis not present

## 2016-12-30 DIAGNOSIS — I482 Chronic atrial fibrillation: Secondary | ICD-10-CM | POA: Diagnosis not present

## 2016-12-30 DIAGNOSIS — Z681 Body mass index (BMI) 19 or less, adult: Secondary | ICD-10-CM | POA: Diagnosis not present

## 2016-12-30 DIAGNOSIS — I10 Essential (primary) hypertension: Secondary | ICD-10-CM | POA: Diagnosis not present

## 2017-01-02 ENCOUNTER — Other Ambulatory Visit: Payer: Self-pay | Admitting: Internal Medicine

## 2017-01-05 ENCOUNTER — Other Ambulatory Visit: Payer: Self-pay

## 2017-01-05 MED ORDER — METFORMIN HCL ER 500 MG PO TB24: ORAL_TABLET | ORAL | 3 refills | Status: DC

## 2017-01-05 NOTE — Telephone Encounter (Signed)
This prescription was sent to the [facility].

## 2017-01-05 NOTE — Telephone Encounter (Signed)
Lawrence Jordan pt, please advise

## 2017-01-11 ENCOUNTER — Other Ambulatory Visit: Payer: Self-pay | Admitting: Internal Medicine

## 2017-01-11 MED ORDER — APIXABAN 5 MG PO TABS: ORAL_TABLET | ORAL | 1 refills | Status: DC

## 2017-01-11 MED ORDER — SOTALOL HCL 120 MG PO TABS: 120.0000 mg | ORAL_TABLET | Freq: Two times a day (BID) | ORAL | 3 refills | Status: DC

## 2017-02-20 ENCOUNTER — Other Ambulatory Visit: Payer: Self-pay | Admitting: Endocrinology

## 2017-02-20 ENCOUNTER — Other Ambulatory Visit: Payer: Self-pay | Admitting: Internal Medicine

## 2017-02-22 ENCOUNTER — Other Ambulatory Visit: Payer: Self-pay | Admitting: Endocrinology

## 2017-03-22 ENCOUNTER — Other Ambulatory Visit: Payer: Self-pay | Admitting: Endocrinology

## 2017-03-22 NOTE — Telephone Encounter (Signed)
Ok to Rf?  You have never filled   [place] 100 UNIT/ML FlexPen [Pharmacy Med Name: NOVOLOG FLEXPEN 100 UNIT/ML Solution Pen-injector]  INJECT 8 UNITS SUBCUTANEOUSLY THREE TIMES DAILY WITH MEALS    Last OV w/ you was 12/09/16 Next OV is 03/29/17

## 2017-03-23 ENCOUNTER — Other Ambulatory Visit (HOSPITAL_COMMUNITY): Payer: Self-pay | Admitting: *Deleted

## 2017-03-23 ENCOUNTER — Encounter (HOSPITAL_COMMUNITY): Payer: Self-pay | Admitting: Nurse Practitioner

## 2017-03-23 ENCOUNTER — Ambulatory Visit (HOSPITAL_COMMUNITY)
Admission: RE | Admit: 2017-03-23 | Discharge: 2017-03-23 | Disposition: A | Discharge status: Home / Self Care | Payer: Medicare Other | Source: Ambulatory Visit | Attending: Nurse Practitioner | Admitting: Nurse Practitioner

## 2017-03-23 DIAGNOSIS — I2510 Atherosclerotic heart disease of native coronary artery without angina pectoris: Secondary | ICD-10-CM | POA: Insufficient documentation

## 2017-03-23 DIAGNOSIS — Z79899 Other long term (current) drug therapy: Secondary | ICD-10-CM | POA: Diagnosis not present

## 2017-03-23 DIAGNOSIS — Z7902 Long term (current) use of antithrombotics/antiplatelets: Secondary | ICD-10-CM | POA: Diagnosis not present

## 2017-03-23 DIAGNOSIS — I4892 Unspecified atrial flutter: Secondary | ICD-10-CM | POA: Insufficient documentation

## 2017-03-23 DIAGNOSIS — E669 Obesity, unspecified: Secondary | ICD-10-CM | POA: Diagnosis not present

## 2017-03-23 DIAGNOSIS — E119 Type 2 diabetes mellitus without complications: Secondary | ICD-10-CM | POA: Diagnosis not present

## 2017-03-23 DIAGNOSIS — E1165 Type 2 diabetes mellitus with hyperglycemia: Secondary | ICD-10-CM | POA: Diagnosis not present

## 2017-03-23 DIAGNOSIS — E7800 Pure hypercholesterolemia, unspecified: Secondary | ICD-10-CM | POA: Diagnosis not present

## 2017-03-23 DIAGNOSIS — K219 Gastro-esophageal reflux disease without esophagitis: Secondary | ICD-10-CM | POA: Diagnosis not present

## 2017-03-23 DIAGNOSIS — Z7901 Long term (current) use of anticoagulants: Secondary | ICD-10-CM | POA: Insufficient documentation

## 2017-03-23 DIAGNOSIS — I4819 Other persistent atrial fibrillation: Secondary | ICD-10-CM

## 2017-03-23 DIAGNOSIS — M1990 Unspecified osteoarthritis, unspecified site: Secondary | ICD-10-CM | POA: Insufficient documentation

## 2017-03-23 DIAGNOSIS — Z7984 Long term (current) use of oral hypoglycemic drugs: Secondary | ICD-10-CM | POA: Diagnosis not present

## 2017-03-23 DIAGNOSIS — G4733 Obstructive sleep apnea (adult) (pediatric): Secondary | ICD-10-CM | POA: Insufficient documentation

## 2017-03-23 DIAGNOSIS — I10 Essential (primary) hypertension: Secondary | ICD-10-CM | POA: Diagnosis not present

## 2017-03-23 DIAGNOSIS — I481 Persistent atrial fibrillation: Secondary | ICD-10-CM | POA: Diagnosis not present

## 2017-03-23 DIAGNOSIS — Z794 Long term (current) use of insulin: Secondary | ICD-10-CM | POA: Diagnosis not present

## 2017-03-23 MED ORDER — METOPROLOL TARTRATE 25 MG PO TABS: 50.0000 mg | ORAL_TABLET | Freq: Two times a day (BID) | ORAL | 3 refills | Status: DC
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
## 2017-03-23 NOTE — Patient Instructions (Signed)
Increase metoprolol to 50mg twice a day 

## 2017-03-25 ENCOUNTER — Other Ambulatory Visit: Payer: Self-pay

## 2017-03-25 ENCOUNTER — Ambulatory Visit (HOSPITAL_COMMUNITY)
Admission: RE | Admit: 2017-03-25 | Discharge: 2017-03-25 | Disposition: A | Discharge status: Home / Self Care | Payer: Medicare Other | Source: Ambulatory Visit | Attending: Nurse Practitioner | Admitting: Nurse Practitioner

## 2017-03-25 DIAGNOSIS — I4892 Unspecified atrial flutter: Secondary | ICD-10-CM | POA: Insufficient documentation

## 2017-03-25 LAB — CBC
HCT: 41.5 % (ref 39.0–52.0)
Hemoglobin: 13.8 g/dL (ref 13.0–17.0)
MCH: 29.6 pg (ref 26.0–34.0)
MCHC: 33.3 g/dL (ref 30.0–36.0)
MCV: 88.9 fL (ref 78.0–100.0)
Platelets: 197 10*3/uL (ref 150–400)
RBC: 4.67 MIL/uL (ref 4.22–5.81)
RDW: 16 % — ABNORMAL HIGH (ref 11.5–15.5)
WBC: 5.4 10*3/uL (ref 4.0–10.5)

## 2017-03-25 LAB — BASIC METABOLIC PANEL
Anion gap: 13 (ref 5–15)
BUN: 20 mg/dL (ref 6–20)
CO2: 18 mmol/L — ABNORMAL LOW (ref 22–32)
Calcium: 9.2 mg/dL (ref 8.9–10.3)
Chloride: 102 mmol/L (ref 101–111)
Creatinine, Ser: 0.88 mg/dL (ref 0.61–1.24)
GFR calc Af Amer: >60 mL/min (ref >60)
GFR calc non Af Amer: >60 mL/min (ref >60)
Glucose, Bld: 250 mg/dL — ABNORMAL HIGH (ref 65–99)
Potassium: 4.7 mmol/L (ref 3.5–5.1)
Sodium: 133 mmol/L — ABNORMAL LOW (ref 135–145)

## 2017-03-25 NOTE — Addendum Note (Signed)
Encounter addended by: Sherran Needs, NP on: 03/25/2017 11:33 AM  Actions taken: Sign clinical note, LOS modified

## 2017-03-25 NOTE — Patient Instructions (Signed)
Cardioversion scheduled for Monday, March 4th  - Arrive at the [facility] and go to admitting at 10:30AM  -Do not eat or drink anything after midnight the night prior to your procedure.  - Take all your medication with a sip of water prior to arrival.  - You will not be able to drive home after your procedure.

## 2017-03-25 NOTE — Progress Notes (Addendum)
Pt in for repeat EKG pre dccv to be reviewed by Roderic Palau, NP.    Pt remains in aflutter with RVR despite increase of metoprolol to 50 mg bid. Auto interpretation of ekg reads possible Stemi, this does not fit with the clinical picture. Pt is tolerating well and no chest pain.  Please see office note from 2/27.

## 2017-03-28 ENCOUNTER — Encounter (HOSPITAL_COMMUNITY)
Admission: RE | Disposition: A | Discharge status: Home / Self Care | Payer: Self-pay | Source: Ambulatory Visit | Attending: Cardiovascular Disease

## 2017-03-28 ENCOUNTER — Ambulatory Visit (HOSPITAL_COMMUNITY): Payer: Medicare Other | Admitting: Anesthesiology

## 2017-03-28 ENCOUNTER — Ambulatory Visit (HOSPITAL_COMMUNITY)
Admission: RE | Admit: 2017-03-28 | Discharge: 2017-03-28 | Disposition: A | Discharge status: Home / Self Care | Payer: Medicare Other | Source: Ambulatory Visit | Attending: Cardiovascular Disease | Admitting: Cardiovascular Disease

## 2017-03-28 ENCOUNTER — Other Ambulatory Visit (HOSPITAL_COMMUNITY): Payer: Self-pay | Admitting: *Deleted

## 2017-03-28 ENCOUNTER — Encounter (HOSPITAL_COMMUNITY): Payer: Self-pay | Admitting: *Deleted

## 2017-03-28 ENCOUNTER — Other Ambulatory Visit: Payer: Self-pay | Admitting: Endocrinology

## 2017-03-28 ENCOUNTER — Ambulatory Visit (HOSPITAL_BASED_OUTPATIENT_CLINIC_OR_DEPARTMENT_OTHER): Payer: Medicare Other

## 2017-03-28 ENCOUNTER — Telehealth (HOSPITAL_COMMUNITY): Payer: Self-pay | Admitting: *Deleted

## 2017-03-28 DIAGNOSIS — I484 Atypical atrial flutter: Secondary | ICD-10-CM

## 2017-03-28 DIAGNOSIS — E7800 Pure hypercholesterolemia, unspecified: Secondary | ICD-10-CM | POA: Insufficient documentation

## 2017-03-28 DIAGNOSIS — Z91041 Radiographic dye allergy status: Secondary | ICD-10-CM | POA: Insufficient documentation

## 2017-03-28 DIAGNOSIS — I481 Persistent atrial fibrillation: Secondary | ICD-10-CM

## 2017-03-28 DIAGNOSIS — Z794 Long term (current) use of insulin: Secondary | ICD-10-CM | POA: Diagnosis not present

## 2017-03-28 DIAGNOSIS — I4819 Other persistent atrial fibrillation: Secondary | ICD-10-CM

## 2017-03-28 DIAGNOSIS — M170 Bilateral primary osteoarthritis of knee: Secondary | ICD-10-CM | POA: Insufficient documentation

## 2017-03-28 DIAGNOSIS — G4733 Obstructive sleep apnea (adult) (pediatric): Secondary | ICD-10-CM | POA: Insufficient documentation

## 2017-03-28 DIAGNOSIS — Z79899 Other long term (current) drug therapy: Secondary | ICD-10-CM | POA: Diagnosis not present

## 2017-03-28 DIAGNOSIS — Z882 Allergy status to sulfonamides status: Secondary | ICD-10-CM | POA: Diagnosis not present

## 2017-03-28 DIAGNOSIS — E119 Type 2 diabetes mellitus without complications: Secondary | ICD-10-CM | POA: Diagnosis not present

## 2017-03-28 DIAGNOSIS — Z6841 Body Mass Index (BMI) 40.0 and over, adult: Secondary | ICD-10-CM | POA: Insufficient documentation

## 2017-03-28 DIAGNOSIS — Z880 Allergy status to penicillin: Secondary | ICD-10-CM | POA: Diagnosis not present

## 2017-03-28 DIAGNOSIS — Z8249 Family history of ischemic heart disease and other diseases of the circulatory system: Secondary | ICD-10-CM | POA: Diagnosis not present

## 2017-03-28 DIAGNOSIS — I4891 Unspecified atrial fibrillation: Secondary | ICD-10-CM | POA: Diagnosis not present

## 2017-03-28 DIAGNOSIS — I4892 Unspecified atrial flutter: Secondary | ICD-10-CM | POA: Insufficient documentation

## 2017-03-28 DIAGNOSIS — I340 Nonrheumatic mitral (valve) insufficiency: Secondary | ICD-10-CM

## 2017-03-28 DIAGNOSIS — I2510 Atherosclerotic heart disease of native coronary artery without angina pectoris: Secondary | ICD-10-CM | POA: Diagnosis not present

## 2017-03-28 DIAGNOSIS — Z888 Allergy status to other drugs, medicaments and biological substances status: Secondary | ICD-10-CM | POA: Insufficient documentation

## 2017-03-28 DIAGNOSIS — M1610 Unilateral primary osteoarthritis, unspecified hip: Secondary | ICD-10-CM | POA: Insufficient documentation

## 2017-03-28 DIAGNOSIS — K219 Gastro-esophageal reflux disease without esophagitis: Secondary | ICD-10-CM | POA: Insufficient documentation

## 2017-03-28 DIAGNOSIS — I10 Essential (primary) hypertension: Secondary | ICD-10-CM | POA: Diagnosis not present

## 2017-03-28 DIAGNOSIS — Z7901 Long term (current) use of anticoagulants: Secondary | ICD-10-CM | POA: Insufficient documentation

## 2017-03-28 HISTORY — PX: TEE WITHOUT CARDIOVERSION: SHX5443

## 2017-03-28 HISTORY — PX: CARDIOVERSION: SHX1299

## 2017-03-28 SURGERY — ECHOCARDIOGRAM, TRANSESOPHAGEAL
Anesthesia: Monitor Anesthesia Care

## 2017-03-28 MED ORDER — BUTAMBEN-TETRACAINE-BENZOCAINE 2-2-14 % EX AERO
INHALATION_SPRAY | CUTANEOUS | Status: DC | PRN
  Administered 2017-03-28: 2 via TOPICAL

## 2017-03-28 MED ORDER — SODIUM CHLORIDE 0.9 % IV SOLN
INTRAVENOUS | Status: DC
  Administered 2017-03-28: 12:00:00 via INTRAVENOUS
  Administered 2017-03-28: 500 mL via INTRAVENOUS

## 2017-03-28 MED ORDER — PROPOFOL 10 MG/ML IV BOLUS
INTRAVENOUS | Status: DC | PRN
  Administered 2017-03-28: 20 mg via INTRAVENOUS
  Administered 2017-03-28 (×2): 40 mg via INTRAVENOUS

## 2017-03-28 MED ORDER — LIDOCAINE HCL (CARDIAC) 20 MG/ML IV SOLN
INTRAVENOUS | Status: DC | PRN
  Administered 2017-03-28: 80 mg via INTRAVENOUS

## 2017-03-28 MED ORDER — PROPOFOL 500 MG/50ML IV EMUL
INTRAVENOUS | Status: DC | PRN
  Administered 2017-03-28: 75 ug/kg/min via INTRAVENOUS

## 2017-03-28 NOTE — Discharge Instructions (Signed)
TEE ° °YOU HAD AN CARDIAC PROCEDURE TODAY: Refer to the procedure report and other information in the discharge instructions given to you for any specific questions about what was found during the examination. If this information does not answer your questions, please call [facility] office at [phone] to clarify.  ° °DIET: Your first meal following the procedure should be a light meal and then it is ok to progress to your normal diet. A half-sandwich or bowl of soup is an example of a good first meal. Heavy or fried foods are harder to digest and may make you feel nauseous or bloated. Drink plenty of fluids but you should avoid alcoholic beverages for 24 hours. If you had a esophageal dilation, please see attached instructions for diet.  ° °ACTIVITY: Your care partner should take you home directly after the procedure. You should plan to take it easy, moving slowly for the rest of the day. You can resume normal activity the day after the procedure however YOU SHOULD NOT DRIVE, use power tools, machinery or perform tasks that involve climbing or major physical exertion for 24 hours (because of the sedation medicines used during the test).  ° °SYMPTOMS TO REPORT IMMEDIATELY: °A cardiologist can be reached at any hour. Please call [phone] for any of the following symptoms:  °Vomiting of blood or coffee ground material  °New, significant abdominal pain  °New, significant chest pain or pain under the shoulder blades  °Painful or persistently difficult swallowing  °New shortness of breath  °Black, tarry-looking or red, bloody stools ° °FOLLOW UP:  °Please also call with any specific questions about appointments or follow up tests. ° °Electrical Cardioversion, Care After °This sheet gives you information about how to care for yourself after your procedure. Your health care provider may also give you more specific instructions. If you have problems or questions, contact your health care provider. °What can I  expect after the procedure? °After the procedure, it is common to have: °· Some redness on the skin where the shocks were given. ° °Follow these instructions at home: °· Do not drive for 24 hours if you were given a medicine to help you relax (sedative). °· Take over-the-counter and prescription medicines only as told by your health care provider. °· Ask your health care provider how to check your pulse. Check it often. °· Rest for 48 hours after the procedure or as told by your health care provider. °· Avoid or limit your caffeine use as told by your health care provider. °Contact a health care provider if: °· You feel like your heart is beating too quickly or your pulse is not regular. °· You have a serious muscle cramp that does not go away. °Get help right away if: °· You have discomfort in your chest. °· You are dizzy or you feel faint. °· You have trouble breathing or you are short of breath. °· Your speech is slurred. °· You have trouble moving an arm or leg on one side of your body. °· Your fingers or toes turn cold or blue. °This information is not intended to replace advice given to you by your health care provider. Make sure you discuss any questions you have with your health care provider. °Document Released: 11/01/2012 Document Revised: 08/15/2015 Document Reviewed: 07/18/2015 °Elsevier Interactive Patient Education © 2018 [employer] ° °

## 2017-03-28 NOTE — Interval H&P Note (Signed)
History and Physical Interval Note:  03/28/2017 11:37 AM  Lawrence Jordan  has presented today for surgery, with the diagnosis of A-FIB  The various methods of treatment have been discussed with the patient and family. After consideration of risks, benefits and other options for treatment, the patient has consented to  Procedure(s): TRANSESOPHAGEAL ECHOCARDIOGRAM (TEE) (N/A) CARDIOVERSION (N/A) as a surgical intervention .  The patient's history has been reviewed, patient examined, no change in status, stable for surgery.  I have reviewed the patient's chart and labs.  Questions were answered to the patient's satisfaction.     [name]

## 2017-03-28 NOTE — Op Note (Signed)
INDICATIONS: atrial fibrillation  PROCEDURE:   Informed consent was obtained prior to the procedure. The risks, benefits and alternatives for the procedure were discussed and the patient comprehended these risks.  Risks include, but are not limited to, cough, sore throat, vomiting, nausea, somnolence, esophageal and stomach trauma or perforation, bleeding, low blood pressure, aspiration, pneumonia, infection, trauma to the teeth and death.    After a procedural time-out, the oropharynx was anesthetized with 20% benzocaine spray.   During this procedure the patient was administered IV propofol, Dr. Fransisco Beau, Anesthesiology.  The transesophageal probe was inserted in the esophagus and stomach without difficulty and multiple views were obtained.  The patient was kept under observation until the patient left the procedure room.  The patient left the procedure room in stable condition.   Agitated microbubble saline contrast was not administered.  COMPLICATIONS:    There were no immediate complications.  FINDINGS:  No left atrial thrombus. Normal appendage velocities. LVEF moderately depressed around 40%, global hypokinesis.  RECOMMENDATIONS:    Proceed with cardioversion  Time Spent Directly with the Patient:  30 minutes   Lawrence Jordan 03/28/2017, 12:15 PM

## 2017-03-28 NOTE — Anesthesia Procedure Notes (Signed)
Procedure Name: MAC Date/Time: 03/28/2017 11:59 AM Performed by: Teressa Lower., CRNA Pre-anesthesia Checklist: Patient identified, Emergency Drugs available and Suction available Patient Re-evaluated:Patient Re-evaluated prior to induction Oxygen Delivery Method: Nasal cannula

## 2017-03-28 NOTE — Transfer of Care (Signed)
Immediate Anesthesia Transfer of Care Note  Patient: Lawrence Jordan  Procedure(s) Performed: TRANSESOPHAGEAL ECHOCARDIOGRAM (TEE) (N/A ) CARDIOVERSION (N/A )  Patient Location: Endoscopy Unit  Anesthesia Type:MAC  Level of Consciousness: awake, alert  and oriented  Airway & Oxygen Therapy: Patient Spontanous Breathing and Patient connected to nasal cannula oxygen  Post-op Assessment: Report given to RN and Post -op Vital signs reviewed and stable  Post vital signs: Reviewed and stable  Last Vitals:  Vitals:   03/28/17 1039  BP: (!) 167/105  Pulse: (!) 110  Resp: 18  Temp: 36.6 C  SpO2: 97%    Last Pain:  Vitals:   03/28/17 1039  TempSrc: Oral         Complications: No apparent anesthesia complications

## 2017-03-28 NOTE — Progress Notes (Signed)
  Echocardiogram Echocardiogram Transesophageal has been performed.  [name] 03/28/2017, 12:20 PM

## 2017-03-28 NOTE — Anesthesia Preprocedure Evaluation (Addendum)
Anesthesia Evaluation  Patient identified by MRN, date of birth, ID band Patient awake    Reviewed: Allergy & Precautions, NPO status , Patient's Chart, lab work & pertinent test results  Airway Mallampati: II  TM Distance: >3 FB Neck ROM: Full    Dental  (+) Teeth Intact   Pulmonary sleep apnea and Continuous Positive Airway Pressure Ventilation ,    Pulmonary exam normal breath sounds clear to auscultation       Cardiovascular hypertension, Pt. on home beta blockers and Pt. on medications + CAD  Normal cardiovascular exam+ dysrhythmias Atrial Fibrillation  Rhythm:Irregular Rate:Normal  EKG - A-flutter  '19 TTE - Mild concentric LVH. EF was in the range of 65% to 70%. Mild AI. Trivial MR. The LAA was severely dilated. Mild TR. Normal PASP.  '19 Nuc Stress - Nuclear stress EF: 61%. The study is normal. There was no ST segment deviation noted during stress.  '12 Cath (results found in Dr. Jackalyn Lombard note) - 30-40% ostial left main stenosis (seemed worse in certain views but FFR was only 0.95, IVUS was fine also), LAD: 20-30% disease, RCA: 40% proximal      Neuro/Psych negative neurological ROS  negative psych ROS   GI/Hepatic Neg liver ROS, GERD  Controlled and Medicated,  Endo/Other  diabetes, Type 2, Insulin DependentMorbid obesity  Renal/GU negative Renal ROS  negative genitourinary   Musculoskeletal  (+) Arthritis ,   Abdominal   Peds  Hematology negative hematology ROS (+)   Anesthesia Other Findings   Reproductive/Obstetrics                           Anesthesia Physical Anesthesia Plan  ASA: III  Anesthesia Plan: MAC   Post-op Pain Management:    Induction: Intravenous  PONV Risk Score and Plan: Propofol infusion and Treatment may vary due to age or medical condition  Airway Management Planned: Nasal Cannula and Natural Airway  Additional Equipment: None  Intra-op  Plan:   Post-operative Plan:   Informed Consent: I have reviewed the patients History and Physical, chart, labs and discussed the procedure including the risks, benefits and alternatives for the proposed anesthesia with the patient or authorized representative who has indicated his/her understanding and acceptance.   Dental advisory given  Plan Discussed with: CRNA  Anesthesia Plan Comments: (If performing cardioversion, GA with natural airway)        Anesthesia Quick Evaluation

## 2017-03-28 NOTE — Anesthesia Postprocedure Evaluation (Signed)
Anesthesia Post Note  Patient: Lawrence Jordan  Procedure(s) Performed: TRANSESOPHAGEAL ECHOCARDIOGRAM (TEE) (N/A ) CARDIOVERSION (N/A )     Patient location during evaluation: PACU Anesthesia Type: General Level of consciousness: awake and alert Pain management: pain level controlled Vital Signs Assessment: post-procedure vital signs reviewed and stable Respiratory status: spontaneous breathing, nonlabored ventilation and respiratory function stable Cardiovascular status: blood pressure returned to baseline and stable Postop Assessment: no apparent nausea or vomiting Anesthetic complications: no    Last Vitals:  Vitals:   03/28/17 1230 03/28/17 1240  BP: 101/61 109/73  Pulse: (!) 59 (!) 59  Resp: (!) 22 17  Temp:    SpO2: 97% 97%    Last Pain:  Vitals:   03/28/17 1224  TempSrc: Oral                 Audry Pili

## 2017-03-28 NOTE — Op Note (Signed)
Procedure: Electrical Cardioversion Indications:  Atrial Fibrillation  Procedure Details:  Consent: Risks of procedure as well as the alternatives and risks of each were explained to the (patient/caregiver).  Consent for procedure obtained.  Time Out: Verified patient identification, verified procedure, site/side was marked, verified correct patient position, special equipment/implants available, medications/allergies/relevent history reviewed, required imaging and test results available.  Performed  Patient placed on cardiac monitor, pulse oximetry, supplemental oxygen as necessary.  Sedation given: IV propofol, Dr. Fransisco Beau. Pacer pads placed anterior and posterior chest.  Cardioverted 1 time(s).  Cardioversion with synchronized biphasic 200J shock.  Evaluation: Findings: Post procedure EKG shows: NSR Complications: None Patient did tolerate procedure well.  Time Spent Directly with the Patient:  30 minutes   Belford Pascucci 03/28/2017, 12:17 PM

## 2017-03-28 NOTE — Telephone Encounter (Signed)
Pt called to find out what meds to hold for dccv today.  Pt was advised to hold all diabetes meds since he is NPO and take only his Eliquis, metoprolol and Sotalol.  Pt understood

## 2017-03-29 ENCOUNTER — Encounter: Payer: Self-pay | Admitting: Endocrinology

## 2017-03-29 ENCOUNTER — Ambulatory Visit (INDEPENDENT_AMBULATORY_CARE_PROVIDER_SITE_OTHER): Payer: Medicare Other | Admitting: Endocrinology

## 2017-03-29 ENCOUNTER — Telehealth (HOSPITAL_COMMUNITY): Payer: Self-pay | Admitting: *Deleted

## 2017-03-29 VITALS — BP 138/98 | HR 75 | Ht 71.0 in | Wt 350.0 lb

## 2017-03-29 DIAGNOSIS — Z794 Long term (current) use of insulin: Secondary | ICD-10-CM

## 2017-03-29 DIAGNOSIS — E1165 Type 2 diabetes mellitus with hyperglycemia: Secondary | ICD-10-CM | POA: Diagnosis not present

## 2017-03-29 DIAGNOSIS — E669 Obesity, unspecified: Secondary | ICD-10-CM | POA: Diagnosis not present

## 2017-03-29 DIAGNOSIS — E1169 Type 2 diabetes mellitus with other specified complication: Secondary | ICD-10-CM | POA: Diagnosis not present

## 2017-03-29 LAB — POCT GLYCOSYLATED HEMOGLOBIN (HGB A1C): Hemoglobin A1C: 8.2

## 2017-03-29 MED ORDER — INSULIN DEGLUDEC 200 UNIT/ML ~~LOC~~ SOPN: 60.0000 [IU] | PEN_INJECTOR | Freq: Every day | SUBCUTANEOUS | 0 refills | Status: DC

## 2017-03-29 MED ORDER — SEMAGLUTIDE(0.25 OR 0.5MG/DOS) 2 MG/1.5ML ~~LOC~~ SOPN: 0.5000 mg | PEN_INJECTOR | SUBCUTANEOUS | 2 refills | Status: DC
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
## 2017-03-29 NOTE — Telephone Encounter (Signed)
Pt called to find out what dose of metoprolol he should continue after the dccv.  Per patient his current BP reading was 136/78 and HR was 80.  Per Roderic Palau, NP, pt should continue metoprolol 50 mg twice a day.  Pt understood.

## 2017-03-29 NOTE — Patient Instructions (Addendum)
20 Novolog  Toujeo 70 for now and keep am sugar 90-130  Tresiba 60 to start replaces [employer]OK OZEMPIC using the pen as shown once weekly on the same day of the week, 0.25 for 1st 2 shots   You may inject in the stomach, thigh or arm as indicated in the brochure given. If you have any difficulties using the pen see the video at [email or web address]  You will feel fullness of the stomach with starting the medication and should try to keep the portions at meals small.  You may experience nausea in the first few days which usually gets better over time    If you have any questions or concerns please call the office or talk to our nurse educator Leonia Reader   You may also talk to a nurse educator with the company at [phone] Useful website: [email or web address]

## 2017-03-31 ENCOUNTER — Encounter (HOSPITAL_COMMUNITY): Payer: Self-pay | Admitting: Nurse Practitioner

## 2017-03-31 ENCOUNTER — Ambulatory Visit (HOSPITAL_COMMUNITY)
Admission: RE | Admit: 2017-03-31 | Discharge: 2017-03-31 | Disposition: A | Discharge status: Home / Self Care | Payer: Medicare Other | Source: Ambulatory Visit | Attending: Nurse Practitioner | Admitting: Nurse Practitioner

## 2017-03-31 VITALS — BP 132/82 | HR 74 | Ht 71.0 in | Wt 352.0 lb

## 2017-03-31 DIAGNOSIS — I2510 Atherosclerotic heart disease of native coronary artery without angina pectoris: Secondary | ICD-10-CM | POA: Diagnosis not present

## 2017-03-31 DIAGNOSIS — G4733 Obstructive sleep apnea (adult) (pediatric): Secondary | ICD-10-CM | POA: Insufficient documentation

## 2017-03-31 DIAGNOSIS — M1990 Unspecified osteoarthritis, unspecified site: Secondary | ICD-10-CM | POA: Insufficient documentation

## 2017-03-31 DIAGNOSIS — E119 Type 2 diabetes mellitus without complications: Secondary | ICD-10-CM | POA: Insufficient documentation

## 2017-03-31 DIAGNOSIS — E7800 Pure hypercholesterolemia, unspecified: Secondary | ICD-10-CM | POA: Insufficient documentation

## 2017-03-31 DIAGNOSIS — I10 Essential (primary) hypertension: Secondary | ICD-10-CM | POA: Diagnosis not present

## 2017-03-31 DIAGNOSIS — Z96641 Presence of right artificial hip joint: Secondary | ICD-10-CM | POA: Insufficient documentation

## 2017-03-31 DIAGNOSIS — Z79899 Other long term (current) drug therapy: Secondary | ICD-10-CM | POA: Diagnosis not present

## 2017-03-31 DIAGNOSIS — Z7901 Long term (current) use of anticoagulants: Secondary | ICD-10-CM | POA: Insufficient documentation

## 2017-03-31 DIAGNOSIS — I4892 Unspecified atrial flutter: Secondary | ICD-10-CM | POA: Diagnosis not present

## 2017-03-31 DIAGNOSIS — Z794 Long term (current) use of insulin: Secondary | ICD-10-CM | POA: Insufficient documentation

## 2017-03-31 DIAGNOSIS — I484 Atypical atrial flutter: Secondary | ICD-10-CM

## 2017-03-31 DIAGNOSIS — E669 Obesity, unspecified: Secondary | ICD-10-CM | POA: Insufficient documentation

## 2017-03-31 DIAGNOSIS — K219 Gastro-esophageal reflux disease without esophagitis: Secondary | ICD-10-CM | POA: Insufficient documentation
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
## 2017-04-14 ENCOUNTER — Telehealth (HOSPITAL_COMMUNITY): Payer: Self-pay | Admitting: *Deleted

## 2017-04-14 DIAGNOSIS — I4892 Unspecified atrial flutter: Secondary | ICD-10-CM

## 2017-04-14 MED ORDER — METOPROLOL TARTRATE 50 MG PO TABS: 75.0000 mg | ORAL_TABLET | Freq: Two times a day (BID) | ORAL | Status: DC

## 2017-04-14 NOTE — Telephone Encounter (Signed)
Patient called in stating he returned to Afib last night HR between 98-138 BP stable. No weight gain noted. No chest pain. Discussed with Roderic Palau NP will increase metoprolol to 75mg  BID and follow up 3/27 with [place] in afib clinic. Pt verbalized understanding - will call if issues prior to appointment.

## 2017-04-20 ENCOUNTER — Encounter (HOSPITAL_COMMUNITY): Payer: Self-pay | Admitting: Nurse Practitioner

## 2017-04-20 ENCOUNTER — Ambulatory Visit (HOSPITAL_BASED_OUTPATIENT_CLINIC_OR_DEPARTMENT_OTHER)
Admission: RE | Admit: 2017-04-20 | Discharge: 2017-04-20 | Disposition: A | Discharge status: Home / Self Care | Payer: Medicare Other | Source: Ambulatory Visit | Attending: Nurse Practitioner | Admitting: Nurse Practitioner

## 2017-04-20 VITALS — BP 132/86 | HR 127 | Ht 71.0 in | Wt 347.0 lb

## 2017-04-20 DIAGNOSIS — Z9889 Other specified postprocedural states: Secondary | ICD-10-CM | POA: Insufficient documentation

## 2017-04-20 DIAGNOSIS — K219 Gastro-esophageal reflux disease without esophagitis: Secondary | ICD-10-CM

## 2017-04-20 DIAGNOSIS — Z7901 Long term (current) use of anticoagulants: Secondary | ICD-10-CM

## 2017-04-20 DIAGNOSIS — I481 Persistent atrial fibrillation: Secondary | ICD-10-CM

## 2017-04-20 DIAGNOSIS — Z6841 Body Mass Index (BMI) 40.0 and over, adult: Secondary | ICD-10-CM

## 2017-04-20 DIAGNOSIS — I110 Hypertensive heart disease with heart failure: Secondary | ICD-10-CM | POA: Diagnosis not present

## 2017-04-20 DIAGNOSIS — I4892 Unspecified atrial flutter: Secondary | ICD-10-CM

## 2017-04-20 DIAGNOSIS — E1010 Type 1 diabetes mellitus with ketoacidosis without coma: Secondary | ICD-10-CM | POA: Diagnosis not present

## 2017-04-20 DIAGNOSIS — I484 Atypical atrial flutter: Secondary | ICD-10-CM | POA: Diagnosis not present

## 2017-04-20 DIAGNOSIS — E119 Type 2 diabetes mellitus without complications: Secondary | ICD-10-CM | POA: Insufficient documentation

## 2017-04-20 DIAGNOSIS — I472 Ventricular tachycardia: Secondary | ICD-10-CM | POA: Diagnosis not present

## 2017-04-20 DIAGNOSIS — Z794 Long term (current) use of insulin: Secondary | ICD-10-CM | POA: Insufficient documentation

## 2017-04-20 DIAGNOSIS — R079 Chest pain, unspecified: Secondary | ICD-10-CM | POA: Diagnosis not present

## 2017-04-20 DIAGNOSIS — I5043 Acute on chronic combined systolic (congestive) and diastolic (congestive) heart failure: Secondary | ICD-10-CM | POA: Diagnosis not present

## 2017-04-20 DIAGNOSIS — J810 Acute pulmonary edema: Secondary | ICD-10-CM | POA: Diagnosis not present

## 2017-04-20 DIAGNOSIS — Z79899 Other long term (current) drug therapy: Secondary | ICD-10-CM

## 2017-04-20 DIAGNOSIS — I10 Essential (primary) hypertension: Secondary | ICD-10-CM | POA: Diagnosis not present

## 2017-04-20 DIAGNOSIS — G4733 Obstructive sleep apnea (adult) (pediatric): Secondary | ICD-10-CM | POA: Diagnosis not present

## 2017-04-20 DIAGNOSIS — J9601 Acute respiratory failure with hypoxia: Secondary | ICD-10-CM | POA: Diagnosis not present

## 2017-04-20 DIAGNOSIS — I4819 Other persistent atrial fibrillation: Secondary | ICD-10-CM

## 2017-04-20 DIAGNOSIS — I471 Supraventricular tachycardia: Secondary | ICD-10-CM | POA: Diagnosis not present

## 2017-04-20 DIAGNOSIS — E7800 Pure hypercholesterolemia, unspecified: Secondary | ICD-10-CM | POA: Diagnosis not present

## 2017-04-20 DIAGNOSIS — E876 Hypokalemia: Secondary | ICD-10-CM | POA: Diagnosis not present

## 2017-04-20 LAB — COMPREHENSIVE METABOLIC PANEL
ALT: 45 U/L (ref 17–63)
AST: 60 U/L — ABNORMAL HIGH (ref 15–41)
Albumin: 3.9 g/dL (ref 3.5–5.0)
Alkaline Phosphatase: 50 U/L (ref 38–126)
Anion gap: 12 (ref 5–15)
BUN: 19 mg/dL (ref 6–20)
CO2: 20 mmol/L — ABNORMAL LOW (ref 22–32)
Calcium: 9.6 mg/dL (ref 8.9–10.3)
Chloride: 105 mmol/L (ref 101–111)
Creatinine, Ser: 0.86 mg/dL (ref 0.61–1.24)
GFR calc Af Amer: >60 mL/min (ref >60)
GFR calc non Af Amer: >60 mL/min (ref >60)
Glucose, Bld: 154 mg/dL — ABNORMAL HIGH (ref 65–99)
Potassium: 4.6 mmol/L (ref 3.5–5.1)
Sodium: 137 mmol/L (ref 135–145)
Total Bilirubin: 1.1 mg/dL (ref 0.3–1.2)
Total Protein: 6.8 g/dL (ref 6.5–8.1)

## 2017-04-20 LAB — TSH: TSH: 2.246 u[IU]/mL (ref 0.350–4.500)

## 2017-04-20 LAB — T4, FREE: Free T4: 0.82 ng/dL (ref 0.61–1.12)

## 2017-04-20 MED ORDER — METOPROLOL TARTRATE 50 MG PO TABS: 100.0000 mg | ORAL_TABLET | Freq: Two times a day (BID) | ORAL | Status: DC

## 2017-04-20 MED ORDER — AMIODARONE HCL 200 MG PO TABS: 200.0000 mg | ORAL_TABLET | Freq: Two times a day (BID) | ORAL | 1 refills | Status: DC
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
## 2017-04-20 NOTE — Patient Instructions (Addendum)
Your physician has recommended you make the following change in your medication:  1) Stop sotalol  2)in 48 hours start amiodarone 200mg  twice a day  3)Increase metoprolol to 100mg  twice a day  Referral has been placed for Bariatric consult  With Dr. Hassell Done  [email or web address]

## 2017-04-21 ENCOUNTER — Encounter: Payer: Self-pay | Admitting: Endocrinology

## 2017-04-22 ENCOUNTER — Other Ambulatory Visit: Payer: Self-pay

## 2017-04-22 ENCOUNTER — Inpatient Hospital Stay (HOSPITAL_COMMUNITY)
Admission: EM | Admit: 2017-04-22 | Discharge: 2017-04-24 | DRG: 308 | Disposition: A | Discharge status: Home / Self Care | Payer: Medicare Other | Attending: Internal Medicine | Admitting: Internal Medicine

## 2017-04-22 ENCOUNTER — Encounter (HOSPITAL_COMMUNITY): Payer: Self-pay | Admitting: Emergency Medicine

## 2017-04-22 ENCOUNTER — Emergency Department (HOSPITAL_COMMUNITY): Payer: Medicare Other

## 2017-04-22 DIAGNOSIS — Z880 Allergy status to penicillin: Secondary | ICD-10-CM

## 2017-04-22 DIAGNOSIS — E785 Hyperlipidemia, unspecified: Secondary | ICD-10-CM | POA: Diagnosis present

## 2017-04-22 DIAGNOSIS — Z96641 Presence of right artificial hip joint: Secondary | ICD-10-CM | POA: Diagnosis present

## 2017-04-22 DIAGNOSIS — I4892 Unspecified atrial flutter: Secondary | ICD-10-CM | POA: Diagnosis not present

## 2017-04-22 DIAGNOSIS — E1010 Type 1 diabetes mellitus with ketoacidosis without coma: Secondary | ICD-10-CM | POA: Diagnosis present

## 2017-04-22 DIAGNOSIS — Z96642 Presence of left artificial hip joint: Secondary | ICD-10-CM | POA: Diagnosis present

## 2017-04-22 DIAGNOSIS — Z6841 Body Mass Index (BMI) 40.0 and over, adult: Secondary | ICD-10-CM | POA: Diagnosis not present

## 2017-04-22 DIAGNOSIS — E7800 Pure hypercholesterolemia, unspecified: Secondary | ICD-10-CM | POA: Diagnosis present

## 2017-04-22 DIAGNOSIS — Z7901 Long term (current) use of anticoagulants: Secondary | ICD-10-CM | POA: Diagnosis not present

## 2017-04-22 DIAGNOSIS — K219 Gastro-esophageal reflux disease without esophagitis: Secondary | ICD-10-CM | POA: Diagnosis present

## 2017-04-22 DIAGNOSIS — I4819 Other persistent atrial fibrillation: Secondary | ICD-10-CM

## 2017-04-22 DIAGNOSIS — I2510 Atherosclerotic heart disease of native coronary artery without angina pectoris: Secondary | ICD-10-CM | POA: Diagnosis present

## 2017-04-22 DIAGNOSIS — Z91041 Radiographic dye allergy status: Secondary | ICD-10-CM

## 2017-04-22 DIAGNOSIS — I110 Hypertensive heart disease with heart failure: Secondary | ICD-10-CM | POA: Diagnosis present

## 2017-04-22 DIAGNOSIS — Z794 Long term (current) use of insulin: Secondary | ICD-10-CM

## 2017-04-22 DIAGNOSIS — R079 Chest pain, unspecified: Secondary | ICD-10-CM

## 2017-04-22 DIAGNOSIS — J9601 Acute respiratory failure with hypoxia: Secondary | ICD-10-CM | POA: Diagnosis present

## 2017-04-22 DIAGNOSIS — E876 Hypokalemia: Secondary | ICD-10-CM | POA: Diagnosis not present

## 2017-04-22 DIAGNOSIS — I5043 Acute on chronic combined systolic (congestive) and diastolic (congestive) heart failure: Secondary | ICD-10-CM | POA: Diagnosis not present

## 2017-04-22 DIAGNOSIS — I481 Persistent atrial fibrillation: Secondary | ICD-10-CM | POA: Diagnosis present

## 2017-04-22 DIAGNOSIS — J810 Acute pulmonary edema: Secondary | ICD-10-CM

## 2017-04-22 DIAGNOSIS — E119 Type 2 diabetes mellitus without complications: Secondary | ICD-10-CM

## 2017-04-22 DIAGNOSIS — I472 Ventricular tachycardia, unspecified: Secondary | ICD-10-CM

## 2017-04-22 DIAGNOSIS — G4733 Obstructive sleep apnea (adult) (pediatric): Secondary | ICD-10-CM | POA: Diagnosis not present

## 2017-04-22 DIAGNOSIS — E1165 Type 2 diabetes mellitus with hyperglycemia: Secondary | ICD-10-CM

## 2017-04-22 DIAGNOSIS — Z882 Allergy status to sulfonamides status: Secondary | ICD-10-CM

## 2017-04-22 DIAGNOSIS — E1110 Type 2 diabetes mellitus with ketoacidosis without coma: Secondary | ICD-10-CM

## 2017-04-22 DIAGNOSIS — Z79899 Other long term (current) drug therapy: Secondary | ICD-10-CM | POA: Diagnosis not present

## 2017-04-22 DIAGNOSIS — Z91048 Other nonmedicinal substance allergy status: Secondary | ICD-10-CM

## 2017-04-22 DIAGNOSIS — I5023 Acute on chronic systolic (congestive) heart failure: Secondary | ICD-10-CM

## 2017-04-22 DIAGNOSIS — M1990 Unspecified osteoarthritis, unspecified site: Secondary | ICD-10-CM | POA: Diagnosis present

## 2017-04-22 DIAGNOSIS — I471 Supraventricular tachycardia: Secondary | ICD-10-CM | POA: Diagnosis not present

## 2017-04-22 DIAGNOSIS — I10 Essential (primary) hypertension: Secondary | ICD-10-CM | POA: Diagnosis present

## 2017-04-22 LAB — MRSA PCR SCREENING: MRSA by PCR: NEGATIVE

## 2017-04-22 LAB — GLUCOSE, CAPILLARY
Glucose-Capillary: 191 mg/dL — ABNORMAL HIGH (ref 65–99)
Glucose-Capillary: 192 mg/dL — ABNORMAL HIGH (ref 65–99)
Glucose-Capillary: 145 mg/dL — ABNORMAL HIGH (ref 65–99)
Glucose-Capillary: 166 mg/dL — ABNORMAL HIGH (ref 65–99)
Glucose-Capillary: 151 mg/dL — ABNORMAL HIGH (ref 65–99)
Glucose-Capillary: 160 mg/dL — ABNORMAL HIGH (ref 65–99)
Glucose-Capillary: 158 mg/dL — ABNORMAL HIGH (ref 65–99)
Glucose-Capillary: 127 mg/dL — ABNORMAL HIGH (ref 65–99)
Glucose-Capillary: 140 mg/dL — ABNORMAL HIGH (ref 65–99)
Glucose-Capillary: 129 mg/dL — ABNORMAL HIGH (ref 65–99)
Glucose-Capillary: 142 mg/dL — ABNORMAL HIGH (ref 65–99)
Glucose-Capillary: 153 mg/dL — ABNORMAL HIGH (ref 65–99)
Glucose-Capillary: 125 mg/dL — ABNORMAL HIGH (ref 65–99)
Glucose-Capillary: 151 mg/dL — ABNORMAL HIGH (ref 65–99)
Glucose-Capillary: 138 mg/dL — ABNORMAL HIGH (ref 65–99)
Glucose-Capillary: 133 mg/dL — ABNORMAL HIGH (ref 65–99)

## 2017-04-22 LAB — TROPONIN I
Troponin I: <0.03 ng/mL (ref <0.03)
Troponin I: 0.38 ng/mL (ref <0.03)
Troponin I: 0.46 ng/mL (ref <0.03)
Troponin I: 0.41 ng/mL (ref <0.03)
Troponin I: 0.4 ng/mL (ref <0.03)

## 2017-04-22 LAB — CBG MONITORING, ED
Glucose-Capillary: 306 mg/dL — ABNORMAL HIGH (ref 65–99)
Glucose-Capillary: 242 mg/dL — ABNORMAL HIGH (ref 65–99)
Glucose-Capillary: 218 mg/dL — ABNORMAL HIGH (ref 65–99)
Glucose-Capillary: 200 mg/dL — ABNORMAL HIGH (ref 65–99)

## 2017-04-22 LAB — COMPREHENSIVE METABOLIC PANEL
ALT: 74 U/L — ABNORMAL HIGH (ref 17–63)
AST: 76 U/L — ABNORMAL HIGH (ref 15–41)
Albumin: 4.7 g/dL (ref 3.5–5.0)
Alkaline Phosphatase: 89 U/L (ref 38–126)
Anion gap: 20 — ABNORMAL HIGH (ref 5–15)
BUN: 19 mg/dL (ref 6–20)
CO2: 16 mmol/L — ABNORMAL LOW (ref 22–32)
Calcium: 9.7 mg/dL (ref 8.9–10.3)
Chloride: 102 mmol/L (ref 101–111)
Creatinine, Ser: 1.13 mg/dL (ref 0.61–1.24)
GFR calc Af Amer: >60 mL/min (ref >60)
GFR calc non Af Amer: >60 mL/min (ref >60)
Glucose, Bld: 293 mg/dL — ABNORMAL HIGH (ref 65–99)
Potassium: 4.3 mmol/L (ref 3.5–5.1)
Sodium: 138 mmol/L (ref 135–145)
Total Bilirubin: 0.7 mg/dL (ref 0.3–1.2)
Total Protein: 9 g/dL — ABNORMAL HIGH (ref 6.5–8.1)

## 2017-04-22 LAB — BASIC METABOLIC PANEL
Anion gap: 15 (ref 5–15)
Anion gap: 15 (ref 5–15)
Anion gap: 14 (ref 5–15)
Anion gap: 13 (ref 5–15)
BUN: 22 mg/dL — ABNORMAL HIGH (ref 6–20)
BUN: 22 mg/dL — ABNORMAL HIGH (ref 6–20)
BUN: 22 mg/dL — ABNORMAL HIGH (ref 6–20)
BUN: 23 mg/dL — ABNORMAL HIGH (ref 6–20)
CO2: 19 mmol/L — ABNORMAL LOW (ref 22–32)
CO2: 20 mmol/L — ABNORMAL LOW (ref 22–32)
CO2: 21 mmol/L — ABNORMAL LOW (ref 22–32)
CO2: 22 mmol/L (ref 22–32)
Calcium: 9.4 mg/dL (ref 8.9–10.3)
Calcium: 9.8 mg/dL (ref 8.9–10.3)
Calcium: 9.7 mg/dL (ref 8.9–10.3)
Calcium: 9.3 mg/dL (ref 8.9–10.3)
Chloride: 102 mmol/L (ref 101–111)
Chloride: 101 mmol/L (ref 101–111)
Chloride: 103 mmol/L (ref 101–111)
Chloride: 104 mmol/L (ref 101–111)
Creatinine, Ser: 0.92 mg/dL (ref 0.61–1.24)
Creatinine, Ser: 0.87 mg/dL (ref 0.61–1.24)
Creatinine, Ser: 0.81 mg/dL (ref 0.61–1.24)
Creatinine, Ser: 0.72 mg/dL (ref 0.61–1.24)
GFR calc Af Amer: >60 mL/min (ref >60)
GFR calc Af Amer: >60 mL/min (ref >60)
GFR calc Af Amer: >60 mL/min (ref >60)
GFR calc Af Amer: >60 mL/min (ref >60)
GFR calc non Af Amer: >60 mL/min (ref >60)
GFR calc non Af Amer: >60 mL/min (ref >60)
GFR calc non Af Amer: >60 mL/min (ref >60)
GFR calc non Af Amer: >60 mL/min (ref >60)
Glucose, Bld: 200 mg/dL — ABNORMAL HIGH (ref 65–99)
Glucose, Bld: 174 mg/dL — ABNORMAL HIGH (ref 65–99)
Glucose, Bld: 136 mg/dL — ABNORMAL HIGH (ref 65–99)
Glucose, Bld: 135 mg/dL — ABNORMAL HIGH (ref 65–99)
Potassium: 4.5 mmol/L (ref 3.5–5.1)
Potassium: 4 mmol/L (ref 3.5–5.1)
Potassium: 3.8 mmol/L (ref 3.5–5.1)
Potassium: 3.5 mmol/L (ref 3.5–5.1)
Sodium: 136 mmol/L (ref 135–145)
Sodium: 136 mmol/L (ref 135–145)
Sodium: 138 mmol/L (ref 135–145)
Sodium: 139 mmol/L (ref 135–145)

## 2017-04-22 LAB — MAGNESIUM: Magnesium: 1.7 mg/dL (ref 1.7–2.4)

## 2017-04-22 LAB — CBC WITH DIFFERENTIAL/PLATELET
Basophils Absolute: 0.1 10*3/uL (ref 0.0–0.1)
Basophils Relative: 1 %
Eosinophils Absolute: 0.3 10*3/uL (ref 0.0–0.7)
Eosinophils Relative: 2 %
HCT: 51.5 % (ref 39.0–52.0)
Hemoglobin: 16.2 g/dL (ref 13.0–17.0)
Lymphocytes Relative: 61 %
Lymphs Abs: 9.5 10*3/uL — ABNORMAL HIGH (ref 0.7–4.0)
MCH: 28.8 pg (ref 26.0–34.0)
MCHC: 31.5 g/dL (ref 30.0–36.0)
MCV: 91.6 fL (ref 78.0–100.0)
Monocytes Absolute: 1.2 10*3/uL — ABNORMAL HIGH (ref 0.1–1.0)
Monocytes Relative: 7 %
Neutro Abs: 4.6 10*3/uL (ref 1.7–7.7)
Neutrophils Relative %: 29 %
Platelets: 256 10*3/uL (ref 150–400)
RBC: 5.62 MIL/uL (ref 4.22–5.81)
RDW: 15.5 % (ref 11.5–15.5)
WBC: 15.7 10*3/uL — ABNORMAL HIGH (ref 4.0–10.5)

## 2017-04-22 LAB — I-STAT TROPONIN, ED: Troponin i, poc: 0 ng/mL (ref 0.00–0.08)

## 2017-04-22 LAB — PROTIME-INR
INR: 1.02
Prothrombin Time: 13.3 seconds (ref 11.4–15.2)

## 2017-04-22 LAB — APTT: aPTT: 28 seconds (ref 24–36)

## 2017-04-22 LAB — BRAIN NATRIURETIC PEPTIDE: B Natriuretic Peptide: 81 pg/mL (ref 0.0–100.0)

## 2017-04-22 MED ORDER — SIMVASTATIN 20 MG PO TABS
20.0000 mg | ORAL_TABLET | Freq: Every day | ORAL | Status: DC
  Administered 2017-04-22 – 2017-04-23 (×2): 20 mg via ORAL
  Filled 2017-04-22 (×2): qty 1
  Filled 2017-04-22 (×3): qty 2

## 2017-04-22 MED ORDER — AMIODARONE HCL IN DEXTROSE 360-4.14 MG/200ML-% IV SOLN
30.0000 mg/h | INTRAVENOUS | Status: AC
  Administered 2017-04-23 (×2): 30 mg/h via INTRAVENOUS
  Filled 2017-04-22 (×2): qty 200

## 2017-04-22 MED ORDER — POTASSIUM CHLORIDE 10 MEQ/100ML IV SOLN
10.0000 meq | INTRAVENOUS | Status: AC
  Administered 2017-04-22 (×2): 10 meq via INTRAVENOUS
  Filled 2017-04-22 (×2): qty 100

## 2017-04-22 MED ORDER — MIDAZOLAM HCL 2 MG/2ML IJ SOLN
INTRAMUSCULAR | Status: AC
  Filled 2017-04-22: qty 2

## 2017-04-22 MED ORDER — SODIUM CHLORIDE 0.9 % IV SOLN: INTRAVENOUS | Status: AC

## 2017-04-22 MED ORDER — MIDAZOLAM HCL 10 MG/2ML IJ SOLN
INTRAMUSCULAR | Status: AC
  Filled 2017-04-22: qty 2

## 2017-04-22 MED ORDER — TRIAMCINOLONE ACETONIDE 0.1 % EX CREA
1.0000 "application " | TOPICAL_CREAM | Freq: Three times a day (TID) | CUTANEOUS | Status: DC | PRN
  Filled 2017-04-22: qty 15

## 2017-04-22 MED ORDER — ASPIRIN 81 MG PO CHEW
81.0000 mg | CHEWABLE_TABLET | Freq: Every day | ORAL | Status: DC
  Administered 2017-04-22 – 2017-04-24 (×3): 81 mg via ORAL
  Filled 2017-04-22 (×3): qty 1

## 2017-04-22 MED ORDER — INSULIN GLARGINE 100 UNIT/ML ~~LOC~~ SOLN
10.0000 [IU] | Freq: Once | SUBCUTANEOUS | Status: AC
  Administered 2017-04-22: 10 [IU] via SUBCUTANEOUS
  Filled 2017-04-22: qty 0.1

## 2017-04-22 MED ORDER — APIXABAN 5 MG PO TABS
5.0000 mg | ORAL_TABLET | Freq: Two times a day (BID) | ORAL | Status: DC
  Administered 2017-04-22 – 2017-04-24 (×5): 5 mg via ORAL
  Filled 2017-04-22 (×6): qty 1

## 2017-04-22 MED ORDER — SODIUM CHLORIDE 0.9% FLUSH: 3.0000 mL | INTRAVENOUS | Status: DC | PRN

## 2017-04-22 MED ORDER — SODIUM CHLORIDE 0.9% FLUSH
3.0000 mL | Freq: Two times a day (BID) | INTRAVENOUS | Status: DC
  Administered 2017-04-22 – 2017-04-24 (×5): 3 mL via INTRAVENOUS

## 2017-04-22 MED ORDER — PANTOPRAZOLE SODIUM 40 MG PO TBEC
40.0000 mg | DELAYED_RELEASE_TABLET | Freq: Every day | ORAL | Status: DC
  Administered 2017-04-22 – 2017-04-23 (×2): 40 mg via ORAL
  Filled 2017-04-22 (×2): qty 1

## 2017-04-22 MED ORDER — SODIUM CHLORIDE 0.9 % IV SOLN
INTRAVENOUS | Status: DC
  Administered 2017-04-22: 1.8 [IU]/h via INTRAVENOUS
  Filled 2017-04-22 (×2): qty 1

## 2017-04-22 MED ORDER — SODIUM CHLORIDE 0.9 % IV SOLN: 250.0000 mL | INTRAVENOUS | Status: DC | PRN

## 2017-04-22 MED ORDER — ONDANSETRON HCL 4 MG/2ML IJ SOLN: 4.0000 mg | Freq: Four times a day (QID) | INTRAMUSCULAR | Status: DC | PRN

## 2017-04-22 MED ORDER — DEXTROSE-NACL 5-0.45 % IV SOLN
INTRAVENOUS | Status: DC
  Administered 2017-04-22: 09:00:00 via INTRAVENOUS

## 2017-04-22 MED ORDER — FUROSEMIDE 10 MG/ML IJ SOLN
40.0000 mg | Freq: Once | INTRAMUSCULAR | Status: AC
  Administered 2017-04-22: 40 mg via INTRAVENOUS
  Filled 2017-04-22: qty 4

## 2017-04-22 MED ORDER — ONDANSETRON HCL 4 MG PO TABS: 4.0000 mg | ORAL_TABLET | Freq: Four times a day (QID) | ORAL | Status: DC | PRN

## 2017-04-22 MED ORDER — METOPROLOL TARTRATE 50 MG PO TABS
100.0000 mg | ORAL_TABLET | Freq: Two times a day (BID) | ORAL | Status: DC
  Administered 2017-04-22 – 2017-04-24 (×3): 100 mg via ORAL
  Filled 2017-04-22 (×4): qty 2

## 2017-04-22 MED ORDER — CHLORHEXIDINE GLUCONATE 0.12 % MT SOLN
15.0000 mL | Freq: Two times a day (BID) | OROMUCOSAL | Status: DC
  Administered 2017-04-22 – 2017-04-24 (×5): 15 mL via OROMUCOSAL
  Filled 2017-04-22 (×5): qty 15

## 2017-04-22 MED ORDER — ORAL CARE MOUTH RINSE
15.0000 mL | Freq: Two times a day (BID) | OROMUCOSAL | Status: DC
  Administered 2017-04-22 – 2017-04-24 (×4): 15 mL via OROMUCOSAL

## 2017-04-22 MED ORDER — ACETAMINOPHEN 325 MG PO TABS: 650.0000 mg | ORAL_TABLET | Freq: Four times a day (QID) | ORAL | Status: DC | PRN

## 2017-04-22 MED ORDER — ASPIRIN 81 MG PO CHEW
324.0000 mg | CHEWABLE_TABLET | Freq: Once | ORAL | Status: AC
  Administered 2017-04-22: 324 mg via ORAL
  Filled 2017-04-22: qty 4

## 2017-04-22 MED ORDER — ACETAMINOPHEN 650 MG RE SUPP: 650.0000 mg | Freq: Four times a day (QID) | RECTAL | Status: DC | PRN

## 2017-04-22 MED ORDER — FUROSEMIDE 10 MG/ML IJ SOLN
40.0000 mg | Freq: Every day | INTRAMUSCULAR | Status: DC
  Administered 2017-04-22 – 2017-04-24 (×3): 40 mg via INTRAVENOUS
  Filled 2017-04-22 (×3): qty 4

## 2017-04-22 MED ORDER — AMIODARONE HCL IN DEXTROSE 360-4.14 MG/200ML-% IV SOLN
60.0000 mg/h | INTRAVENOUS | Status: AC
  Administered 2017-04-22 (×2): 60 mg/h via INTRAVENOUS
  Filled 2017-04-22 (×2): qty 200

## 2017-04-22 MED ORDER — AMIODARONE LOAD VIA INFUSION
150.0000 mg | Freq: Once | INTRAVENOUS | Status: AC
  Administered 2017-04-22: 150 mg via INTRAVENOUS
  Filled 2017-04-22: qty 83.34

## 2017-04-22 MED ORDER — INSULIN ASPART 100 UNIT/ML ~~LOC~~ SOLN
0.0000 [IU] | SUBCUTANEOUS | Status: DC
  Administered 2017-04-23 (×2): 4 [IU] via SUBCUTANEOUS
  Administered 2017-04-23: 3 [IU] via SUBCUTANEOUS
  Administered 2017-04-23: 7 [IU] via SUBCUTANEOUS
  Administered 2017-04-23: 4 [IU] via SUBCUTANEOUS
  Administered 2017-04-23: 3 [IU] via SUBCUTANEOUS
  Administered 2017-04-24: 4 [IU] via SUBCUTANEOUS
  Administered 2017-04-24: 7 [IU] via SUBCUTANEOUS
  Administered 2017-04-24 (×2): 4 [IU] via SUBCUTANEOUS

## 2017-04-22 MED ORDER — MIDAZOLAM HCL 2 MG/2ML IJ SOLN
2.0000 mg | Freq: Once | INTRAMUSCULAR | Status: AC
  Administered 2017-04-22: 2 mg via INTRAVENOUS

## 2017-04-22 MED ORDER — SODIUM CHLORIDE 0.9 % IV SOLN: INTRAVENOUS | Status: DC

## 2017-04-22 MED ORDER — POTASSIUM CHLORIDE 10 MEQ/100ML IV SOLN: 10.0000 meq | INTRAVENOUS | Status: DC
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
## 2017-04-22 NOTE — Progress Notes (Addendum)
Inpatient Diabetes Program Recommendations  AACE/ADA: New Consensus Statement on Inpatient Glycemic Control (2015)  Target Ranges:  Prepandial:   less than 140 mg/dL      Peak postprandial:   less than 180 mg/dL (1-2 hours)      Critically ill patients:  140 - 180 mg/dL   Results for EVANDER, MACARAEG" (MRN [id number]) as of 04/22/2017 07:56  Ref. Range 04/22/2017 02:10 04/22/2017 03:42 04/22/2017 04:45 04/22/2017 05:55 04/22/2017 07:13  Glucose-Capillary Latest Ref Range: 65 - 99 mg/dL 306 (H) 242 (H) 218 (H) 200 (H) 191 (H)  Results for TAI, SKELLY" (MRN [id number]) as of 04/22/2017 07:56  Ref. Range 03/29/2017 10:58  Hemoglobin A1C Unknown 8.2   Review of Glycemic Control  Diabetes history: DM2 Outpatient Diabetes medications: Tresiba 60 units daily, Novolog 20 units TID with meals, Jardiance 25 mg daily, Metformin XR 1000 mg BID, Victoza 1.8 mg daily Current orders for Inpatient glycemic control: IV insulin drip per DKA protocol  Inpatient Diabetes Program Recommendations:  Insulin - Basal: At time of transition from IV to SQ insulin, please consider ordering Lantus 45 units Q24H (based approximately on 156 kg x 0.3 units). Correction (SSI): At time of transition from IV to SQ insulin, please consider ordering CBGs and Novolog 0-15 units Q4H. Insulin - Meal Coverage: At time of transition from IV to SQ insulin, please consider ordering Novolog 5 units TID with meals for meal coverage. HgbA1C: A1C 8.2% on 03/29/17 which indicates an average glucose of 189 mg/dl over the past 2-3 months. Patient is followed by Dr. Dwyane Dee (Endocrinologist) for DM management and was last seen on 03/29/2017. Noted basal insulin was changed from Toujeo 60 units daily to [place] 60 units daily on 03/29/2017. DKA:  Labs on 04/22/17 @ 2:23 am resulted with CO2 16 and AG 20. Labs on 04/22/17 @ 7:35 am resulted with CO2 19 and AG 15.  Noted patient takes Jardiance as an outpatient which decreases serum glucose levels  and can cause patient to have ketones (due to fat utilization for energy needs).  Addendum 04/22/17@14 :30: Spoke with patient about diabetes and home regimen for diabetes control. Patient reports that he is followed by Dr. Dwyane Dee for diabetes management and currently he takes [place] 60 units daily, Novolog 20 units TID with meals, Jardiance 25 mg daily, Metformin XR 1000 mg BID, Victoza 1.8 mg daily as an outpatient for diabetes control. Noted Dr. Ronnie Derby office note on 03/29/2017 noted patient would start Ozempic instead of Victoza. Inquired about Ozempic and patient states that he has several boxes of Victoza at home and he was planning to use up what he has at home of the Victoza before starting on the Ozempic which he already has at home as well. Patient confirms that he was recently changed from [place] to [place] on 03/29/2017 and he reports that he has been on Jardiance for many years. Patient reports that he is taking DM medications as prescribed but admits that he sometimes forgets to take his oral DM meds once a in while. Patient states that he is not checking his glucose very often but when he does it is no more than once a day and it is usually 180-mid 200's mg/dl.  Inquired about prior A1C and patient reports that his last A1C value was 8.2% on 03/29/2017. Discussed glucose and A1C goals. Discussed importance of checking CBGs and maintaining good CBG control to prevent long-term and short-term complications. Stressed to the patient the importance of improving glycemic  control to prevent further complications from uncontrolled diabetes. Discussed impact of nutrition, exercise, stress, sickness, and medications on diabetes control. Patient admits that he has "been eating everything".  Discussed carbohydrates, carbohydrate goals per day and meal, along with portion sizes and encouraged patient to start following carb modified diet.  Discussed Tresiba insulin and Jardiance. Discussed DKA and protocol of when it  would be appropriate to transition from IV to SQ insulin once acidosis is cleared. Discussed glucose monitoring more frequently and patient does not like to check glucose. Discussed FreeStyle Libre (flash glucose monitoring sensor) and encouraged patient to ask MD about prescribing if he is interested. Explained how MD needs more data on glycemic trends to make appropriate changes with DM medications to improve DM control. Encouraged patient to check glucose more often or ask about being prescribed a glucose monitoring sensor (such as the [employer]).  Patient verbalized understanding of information discussed and he states that he has no further questions at this time related to diabetes.  Thanks, Barnie Alderman, RN, MSN, CDE Diabetes Coordinator Inpatient Diabetes Program [phone] (Team Pager from 8am to 5pm)
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
## 2017-04-22 NOTE — Care Management Important Message (Signed)
Important Message  Patient Details  Name: Lawrence Jordan MRN: [id number] Date of Birth: 1950/10/08   Medicare Important Message Given:  Yes    Shelda Altes 04/22/2017, 12:13 PM
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
## 2017-04-22 NOTE — Progress Notes (Addendum)
PROGRESS NOTE  Lawrence Jordan JOI:[id number] DOB: 1950-12-05 DOA: 04/22/2017 PCP: [name], MD  Brief History:  67 year old male with a history of persistent atrial fibrillation, coronary disease, hypertension, hyperlipidemia, diabetes mellitus, morbid obesity, hyperlipidemia presenting with 2-3-day history of intermittent exertional chest pain or shortness of breath.  He endorses orthopnea type symptoms with increasing lower extremity edema over the past week.  He denied any fevers, chills, nausea, vomiting, abdominal pain, dysuria, hematuria.  Upon presentation to emergency department, the patient was found to have SVT with a heart rate in the 200s.  He was cardioverted in the ED, and he is back down to sinus rhythm.  The patient was initially placed on BiPAP in the ED.  The patient endorses compliance with all his medications however, he endorses dietary indiscretion.  He does not weigh himself regularly.  The patient recently saw EP, Dr. [name], who directed the patient to increase his metoprolol to 100 mg twice daily, stop his sotalol, and start amiodarone on 04/22/2017.  The patient recently had TEE cardioversion on 03/28/2017.  In the emergency department, the patient was found to have serum glucose of 293 with anion gap of 20.  The patient was started on insulin drip.  Assessment/Plan: Acute respiratory failure with hypoxia -Secondary to pulmonary edema/CHF -Initially placed on BiPAP -Wean oxygen back to room air for saturation greater 90%  Acute on chronic systolic CHF -05/01/960 TEE EF 40-45%, diffuse HK, mild to moderate MR. -Continue IV Lasix -Daily weights -Strict I's and O's  Persistent atrial fibrillation -Status post cardioversion in the emergency department as discussed above -Cardiology consult -Defer start of amiodarone to cardiology -03/28/2017 TEE EF 40-45%, diffuse HK, mild to moderate MR. -Presently in sinus rhythm -Continue metoprolol tartrate -Continue  apixaban -TSH 2.246  DKA, type II -Concerned that this may been precipitated by the patient's Jardiance --patient started on IV insulin with q 1 hour CBG check and q 4 hour BMPs -pt started on aggressive fluid resuscitation -Electrolytes were monitored and repleted -transitioned to [employer] insulin once anion gap closed -diet was advanced once anion gap closed -03/29/2017 HbA1C--8.2 -Holding Jardiance, metformin, Ozempic  Essential hypertension -Continue metoprolol tartrate  Morbid obesity -BMI 49.5 -lifestyle modification   Hyperlipidemia -Continue statin  OSA -Continue nighttime CPAP  Exertional chest pain -cardiology consult -cycle troponins -EKG--sinus ST depression v4-6, I, II, aVL     Disposition Plan:   Home in 2-3 days  Family Communication:   Family at bedside--Total time spent 35 minutes.  Greater than 50% spent face to face counseling and coordinating care. 0755 to 77   Consultants:  cardiology  Code Status:  FULL  DVT Prophylaxis: apixaban   Procedures: As Listed in Progress Note Above  Antibiotics: None    Subjective: Patient denies fevers, chills, headache, chest pain, dyspnea, nausea, vomiting, diarrhea, abdominal pain, dysuria, hematuria, hematochezia, and melena.   Objective: Vitals:   04/22/17 0545 04/22/17 0600 04/22/17 0630 04/22/17 0635  BP: 113/81 113/65    Pulse: 82 63 68   Resp: (!) 25 20 (!) 24   Temp:    (!) 97.4 F (36.3 C)  TempSrc:    Oral  SpO2: 100% 99% 98%   Weight:    (!) 156.6 kg (345 lb 3.9 oz)  Height:    5\' 10"  (1.778 m)    Intake/Output Summary (Last 24 hours) at 04/22/2017 0815 Last data filed at 04/22/2017 0552 Gross per 24 hour  Intake  200 ml  Output -  Net 200 ml   Weight change:  Exam:   General:  Pt is alert, follows commands appropriately, not in acute distress  HEENT: No icterus, No thrush, No neck mass, [place]/AT  Cardiovascular: RRR, S1/S2, no rubs, no gallops  Respiratory: bibasilar crackles,  no wheeze  Abdomen: Soft/+BS, non tender, non distended, no guarding  Extremities: 2 + LE edema, No lymphangitis, No petechiae, No rashes, no synovitis   Data Reviewed: I have personally reviewed following labs and imaging studies Basic Metabolic Panel: Recent Labs  Lab 04/20/17 1232 04/22/17 0223 04/22/17 0735  NA 137 138 136  K 4.6 4.3 4.5  CL 105 102 102  CO2 20* 16* 19*  GLUCOSE 154* 293* 200*  BUN 19 19 22*  CREATININE 0.86 1.13 0.92  CALCIUM 9.6 9.7 9.4   Liver Function Tests: Recent Labs  Lab 04/20/17 1232 04/22/17 0223  AST 60* 76*  ALT 45 74*  ALKPHOS 50 89  BILITOT 1.1 0.7  PROT 6.8 9.0*  ALBUMIN 3.9 4.7   No results for input(s): LIPASE, AMYLASE in the last 168 hours. No results for input(s): AMMONIA in the last 168 hours. Coagulation Profile: Recent Labs  Lab 04/22/17 0223  INR 1.02   CBC: Recent Labs  Lab 04/22/17 0223  WBC 15.7*  NEUTROABS 4.6  HGB 16.2  HCT 51.5  MCV 91.6  PLT 256   Cardiac Enzymes: Recent Labs  Lab 04/22/17 0223  TROPONINI <0.03   BNP: Invalid input(s): POCBNP CBG: Recent Labs  Lab 04/22/17 0210 04/22/17 0342 04/22/17 0445 04/22/17 0555 04/22/17 0713  GLUCAP 306* 242* 218* 200* 191*   HbA1C: No results for input(s): HGBA1C in the last 72 hours. Urine analysis:    Component Value Date/Time   COLORURINE YELLOW 11/12/2014 1037   APPEARANCEUR CLEAR 11/12/2014 1037   LABSPEC <=1.005 (A) 11/12/2014 1037   PHURINE 5.5 11/12/2014 1037   GLUCOSEU >=1000 (A) 11/12/2014 1037   HGBUR NEGATIVE 11/12/2014 1037   BILIRUBINUR NEGATIVE 11/12/2014 1037   KETONESUR NEGATIVE 11/12/2014 1037   PROTEINUR 30 (A) 03/05/2013 1052   UROBILINOGEN 0.2 11/12/2014 1037   NITRITE NEGATIVE 11/12/2014 1037   LEUKOCYTESUR NEGATIVE 11/12/2014 1037   Sepsis Labs: @LABRCNTIP (procalcitonin:4,lacticidven:4) )No results found for this or any previous visit (from the past 240 hour(s)).   Scheduled Meds: . apixaban  5 mg Oral BID    . chlorhexidine  15 mL Mouth Rinse BID  . mouth rinse  15 mL Mouth Rinse q12n4p  . metoprolol tartrate  100 mg Oral BID  . pantoprazole  40 mg Oral QHS  . simvastatin  20 mg Oral QHS  . sodium chloride flush  3 mL Intravenous Q12H   Continuous Infusions: . sodium chloride    . insulin (NOVOLIN-R) infusion 5.2 Units/hr (04/22/17 0715)    Procedures/Studies: Dg Chest Port 1 View  Result Date: 04/22/2017 CLINICAL DATA:  67 y/o M; 1 hour of chest pain and shortness of breath. EXAM: PORTABLE CHEST 1 VIEW COMPARISON:  07/27/2016 chest radiograph. FINDINGS: Stable mild cardiomegaly given projection and technique. Aortic atherosclerosis. Diffuse reticular and patchy opacities of the lungs. Transcutaneous pacing pads noted. Trace fluid on the right minor fissure. Bones are unremarkable. IMPRESSION: Alveolar pulmonary edema. Small right effusion. Stable mild cardiomegaly. Electronically Signed   By: Kristine Garbe M.D.   On: 04/22/2017 02:46    Orson Eva, DO  [facility] Pager [phone]  If 7PM-7AM, please contact night-coverage [email or web address] Password [facility] 04/22/2017, 8:15 AM  LOS: 0 days

## 2017-04-22 NOTE — ED Triage Notes (Signed)
Chest pain x 30 mins center chest, pt c/o SOB, sweaty upon arrival, "feels like I'm drowning"

## 2017-04-23 LAB — GLUCOSE, CAPILLARY
Glucose-Capillary: 142 mg/dL — ABNORMAL HIGH (ref 65–99)
Glucose-Capillary: 145 mg/dL — ABNORMAL HIGH (ref 65–99)
Glucose-Capillary: 157 mg/dL — ABNORMAL HIGH (ref 65–99)
Glucose-Capillary: 166 mg/dL — ABNORMAL HIGH (ref 65–99)
Glucose-Capillary: 176 mg/dL — ABNORMAL HIGH (ref 65–99)
Glucose-Capillary: 180 mg/dL — ABNORMAL HIGH (ref 65–99)
Glucose-Capillary: 240 mg/dL — ABNORMAL HIGH (ref 65–99)

## 2017-04-23 LAB — CBC
HCT: 38.6 % — ABNORMAL LOW (ref 39.0–52.0)
Hemoglobin: 11.9 g/dL — ABNORMAL LOW (ref 13.0–17.0)
MCH: 27.7 pg (ref 26.0–34.0)
MCHC: 30.8 g/dL (ref 30.0–36.0)
MCV: 89.8 fL (ref 78.0–100.0)
Platelets: 184 10*3/uL (ref 150–400)
RBC: 4.3 MIL/uL (ref 4.22–5.81)
RDW: 15.7 % — ABNORMAL HIGH (ref 11.5–15.5)
WBC: 7.7 10*3/uL (ref 4.0–10.5)

## 2017-04-23 LAB — BASIC METABOLIC PANEL
Anion gap: 12 (ref 5–15)
BUN: 25 mg/dL — ABNORMAL HIGH (ref 6–20)
CO2: 23 mmol/L (ref 22–32)
Calcium: 8.7 mg/dL — ABNORMAL LOW (ref 8.9–10.3)
Chloride: 103 mmol/L (ref 101–111)
Creatinine, Ser: 0.73 mg/dL (ref 0.61–1.24)
GFR calc Af Amer: >60 mL/min (ref >60)
GFR calc non Af Amer: >60 mL/min (ref >60)
Glucose, Bld: 163 mg/dL — ABNORMAL HIGH (ref 65–99)
Potassium: 3.3 mmol/L — ABNORMAL LOW (ref 3.5–5.1)
Sodium: 138 mmol/L (ref 135–145)

## 2017-04-23 MED ORDER — POTASSIUM CHLORIDE CRYS ER 20 MEQ PO TBCR
40.0000 meq | EXTENDED_RELEASE_TABLET | ORAL | Status: AC
  Administered 2017-04-23 (×2): 40 meq via ORAL
  Filled 2017-04-23 (×2): qty 2

## 2017-04-23 MED ORDER — PHENOL 1.4 % MT LIQD
1.0000 | OROMUCOSAL | Status: DC | PRN
  Administered 2017-04-23: 1 via OROMUCOSAL
  Filled 2017-04-23: qty 177

## 2017-04-23 MED ORDER — AMIODARONE HCL 200 MG PO TABS
200.0000 mg | ORAL_TABLET | Freq: Two times a day (BID) | ORAL | Status: DC
  Administered 2017-04-23 – 2017-04-24 (×2): 200 mg via ORAL
  Filled 2017-04-23 (×2): qty 1

## 2017-04-23 NOTE — Progress Notes (Signed)
PROGRESS NOTE  Lawrence Jordan FBP:[id number] DOB: 03-17-1950 DOA: 04/22/2017 PCP: [name], MD  Brief History:  67 year old male with a history of persistent atrial fibrillation, coronary disease, hypertension, hyperlipidemia, diabetes mellitus, morbid obesity, hyperlipidemia presenting with 2-3-day history of intermittent exertional chest pain or shortness of breath.  He endorses orthopnea type symptoms with increasing lower extremity edema over the past week.  He denied any fevers, chills, nausea, vomiting, abdominal pain, dysuria, hematuria.  Upon presentation to emergency department, the patient was found to have SVT with a heart rate in the 200s.  He was cardioverted in the ED, and he is back down to sinus rhythm.  The patient was initially placed on BiPAP in the ED.  The patient endorses compliance with all his medications however, he endorses dietary indiscretion.  He does not weigh himself regularly.  The patient recently saw EP, Dr. [name], who directed the patient to increase his metoprolol to 100 mg twice daily, stop his sotalol, and start amiodarone on 04/22/2017.  The patient recently had TEE cardioversion on 03/28/2017.  In the emergency department, the patient was found to have serum glucose of 293 with anion gap of 20.  The patient was started on insulin drip.  Assessment/Plan: Acute respiratory failure with hypoxia -Secondary to pulmonary edema/CHF -Initially placed on BiPAP -Wean oxygen back to room air for saturation greater 90%  Acute on chronic systolic CHF -08/26/4233 TEE EF 40-45%, diffuse HK, mild to moderate MR. -Continue IV Lasix -Daily weights -Strict I's and O's  Persistent atrial fibrillation -Status post cardioversion in the emergency department as discussed above -Cardiology consult -Defer start of amiodarone to cardiology -03/28/2017 TEE EF 40-45%, diffuse HK, mild to moderate MR. -Presently in sinus rhythm -Continue metoprolol tartrate -Continue  apixaban -TSH 2.246  Per cardiology amiodarone drip 24rhs then transition back to oral bid, ordered  DKA, type II -Concerned that this may been precipitated by the patient's Jardiance --patient started on IV insulin with q 1 hour CBG check and q 4 hour BMPs -pt started on aggressive fluid resuscitation -Electrolytes were monitored and repleted -transitioned to [place] insulin once anion gap closed -diet was advanced once anion gap closed -03/29/2017 HbA1C--8.2 -Holding Jardiance, metformin, Ozempic -off insulin drip, start diet, titrate insulin  Essential hypertension -Continue metoprolol tartrate  Morbid obesity -BMI 49.5 -lifestyle modification   Hyperlipidemia -Continue statin  OSA -Continue nighttime CPAP  Exertional chest pain -cardiology consult -cycle troponins -EKG--sinus ST depression v4-6, I, II, aVL     Disposition Plan:   Home in 2-3 days  Family Communication:  Patient    Consultants:  cardiology  Code Status:  FULL  DVT Prophylaxis: apixaban   Procedures: As Listed in Progress Note Above  Antibiotics: None    Subjective:  He wears cpap at night On amiodarone drip, heart rate 68, bp 120 No chest pain, no cough, no hypoxia, urine output was not documented, on iv lasix  Patient denies fevers, chills, headache, chest pain, dyspnea, nausea, vomiting, diarrhea, abdominal pain, dysuria, hematuria, hematochezia, and melena.   Objective: Vitals:   04/23/17 0600 04/23/17 0700 04/23/17 0751 04/23/17 0939  BP: 113/77 122/77  122/73  Pulse: 66 69 65 68  Resp: 17 (!) 23 20   Temp:   97.9 F (36.6 C)   TempSrc:   Axillary   SpO2: 96% 98% 98%   Weight:      Height:        Intake/Output Summary (  Last 24 hours) at 04/23/2017 1007 Last data filed at 04/23/2017 0700 Gross per 24 hour  Intake 830.35 ml  Output -  Net 830.35 ml   Weight change: -2.298 kg (-5 lb 1.1 oz) Exam:   General:  Pt is alert, follows commands appropriately, not in acute  distress, obese  HEENT: No icterus, No thrush, No neck mass, [place]/AT  Cardiovascular: RRR, S1/S2, no rubs, no gallops  Respiratory: bibasilar crackles, no wheeze  Abdomen: Soft/+BS, non tender, non distended, no guarding  Extremities: less LE edema, No lymphangitis, No petechiae, No rashes, no synovitis   Data Reviewed: I have personally reviewed following labs and imaging studies Basic Metabolic Panel: Recent Labs  Lab 04/22/17 0735 04/22/17 1228 04/22/17 1630 04/22/17 2033 04/23/17 0524  NA 136 136 138 139 138  K 4.5 4.0 3.8 3.5 3.3*  CL 102 101 103 104 103  CO2 19* 20* 21* 22 23  GLUCOSE 200* 174* 136* 135* 163*  BUN 22* 22* 22* 23* 25*  CREATININE 0.92 0.87 0.81 0.72 0.73  CALCIUM 9.4 9.8 9.7 9.3 8.7*  MG  --  1.7  --   --   --    Liver Function Tests: Recent Labs  Lab 04/20/17 1232 04/22/17 0223  AST 60* 76*  ALT 45 74*  ALKPHOS 50 89  BILITOT 1.1 0.7  PROT 6.8 9.0*  ALBUMIN 3.9 4.7   No results for input(s): LIPASE, AMYLASE in the last 168 hours. No results for input(s): AMMONIA in the last 168 hours. Coagulation Profile: Recent Labs  Lab 04/22/17 0223  INR 1.02   CBC: Recent Labs  Lab 04/22/17 0223 04/23/17 0524  WBC 15.7* 7.7  NEUTROABS 4.6  --   HGB 16.2 11.9*  HCT 51.5 38.6*  MCV 91.6 89.8  PLT 256 184   Cardiac Enzymes: Recent Labs  Lab 04/22/17 0223 04/22/17 0735 04/22/17 1228 04/22/17 1427 04/22/17 1630  TROPONINI <0.03 0.38* 0.46* 0.41* 0.40*   BNP: Invalid input(s): POCBNP CBG: Recent Labs  Lab 04/22/17 2224 04/22/17 2301 04/23/17 0006 04/23/17 0455 04/23/17 0749  GLUCAP 138* 133* 142* 166* 180*   HbA1C: No results for input(s): HGBA1C in the last 72 hours. Urine analysis:    Component Value Date/Time   COLORURINE YELLOW 11/12/2014 1037   APPEARANCEUR CLEAR 11/12/2014 1037   LABSPEC <=1.005 (A) 11/12/2014 1037   PHURINE 5.5 11/12/2014 1037   GLUCOSEU >=1000 (A) 11/12/2014 1037   HGBUR NEGATIVE 11/12/2014  1037   BILIRUBINUR NEGATIVE 11/12/2014 1037   KETONESUR NEGATIVE 11/12/2014 1037   PROTEINUR 30 (A) 03/05/2013 1052   UROBILINOGEN 0.2 11/12/2014 1037   NITRITE NEGATIVE 11/12/2014 1037   LEUKOCYTESUR NEGATIVE 11/12/2014 1037   Sepsis Labs: @LABRCNTIP (procalcitonin:4,lacticidven:4) ) Recent Results (from the past 240 hour(s))  MRSA PCR Screening     Status: None   Collection Time: 04/22/17  6:20 AM  Result Value Ref Range Status   MRSA by PCR NEGATIVE NEGATIVE Final    Comment:        The GeneXpert MRSA Assay (FDA approved for NASAL specimens only), is one component of a comprehensive MRSA colonization surveillance program. It is not intended to diagnose MRSA infection nor to guide or monitor treatment for MRSA infections. Performed at [facility], [address], [place], [place] [address]      Scheduled Meds: . apixaban  5 mg Oral BID  . aspirin  81 mg Oral Daily  . chlorhexidine  15 mL Mouth Rinse BID  . furosemide  40 mg  Intravenous Daily  . insulin aspart  0-20 Units Subcutaneous Q4H  . mouth rinse  15 mL Mouth Rinse q12n4p  . metoprolol tartrate  100 mg Oral BID  . pantoprazole  40 mg Oral QHS  . potassium chloride  40 mEq Oral Q4H  . simvastatin  20 mg Oral QHS  . sodium chloride flush  3 mL Intravenous Q12H   Continuous Infusions: . sodium chloride 10 mL/hr at 04/23/17 0400  . amiodarone 30 mg/hr (04/23/17 0700)    Procedures/Studies: Dg Chest Port 1 View  Result Date: 04/22/2017 CLINICAL DATA:  67 y/o M; 1 hour of chest pain and shortness of breath. EXAM: PORTABLE CHEST 1 VIEW COMPARISON:  07/27/2016 chest radiograph. FINDINGS: Stable mild cardiomegaly given projection and technique. Aortic atherosclerosis. Diffuse reticular and patchy opacities of the lungs. Transcutaneous pacing pads noted. Trace fluid on the right minor fissure. Bones are unremarkable. IMPRESSION: Alveolar pulmonary edema. Small right effusion. Stable mild cardiomegaly. Electronically  Signed   By: Kristine Garbe M.D.   On: 04/22/2017 02:46    Florencia Reasons, MD PhD [facility] Pager [phone]  If 7PM-7AM, please contact night-coverage [email or web address] Password [email or web address] 04/23/2017, 10:07 AM   LOS: 1 day

## 2017-04-23 NOTE — Progress Notes (Addendum)
Patient on CPAP 10 adjusted up from 6.5 as requested by him. 3 lpm oxygen bled in to CPAP

## 2017-04-24 LAB — COMPREHENSIVE METABOLIC PANEL
ALT: 62 U/L (ref 17–63)
AST: 61 U/L — ABNORMAL HIGH (ref 15–41)
Albumin: 3.8 g/dL (ref 3.5–5.0)
Alkaline Phosphatase: 39 U/L (ref 38–126)
Anion gap: 12 (ref 5–15)
BUN: 28 mg/dL — ABNORMAL HIGH (ref 6–20)
CO2: 23 mmol/L (ref 22–32)
Calcium: 8.9 mg/dL (ref 8.9–10.3)
Chloride: 103 mmol/L (ref 101–111)
Creatinine, Ser: 0.67 mg/dL (ref 0.61–1.24)
GFR calc Af Amer: >60 mL/min (ref >60)
GFR calc non Af Amer: >60 mL/min (ref >60)
Glucose, Bld: 160 mg/dL — ABNORMAL HIGH (ref 65–99)
Potassium: 3.6 mmol/L (ref 3.5–5.1)
Sodium: 138 mmol/L (ref 135–145)
Total Bilirubin: 0.7 mg/dL (ref 0.3–1.2)
Total Protein: 6.9 g/dL (ref 6.5–8.1)

## 2017-04-24 LAB — GLUCOSE, CAPILLARY
Glucose-Capillary: 154 mg/dL — ABNORMAL HIGH (ref 65–99)
Glucose-Capillary: 204 mg/dL — ABNORMAL HIGH (ref 65–99)

## 2017-04-24 LAB — MAGNESIUM: Magnesium: 1.8 mg/dL (ref 1.7–2.4)

## 2017-04-24 MED ORDER — LIVING BETTER WITH HEART FAILURE BOOK
Freq: Once | Status: AC
  Administered 2017-04-24: 12:00:00

## 2017-04-24 MED ORDER — FUROSEMIDE 40 MG PO TABS: 40.0000 mg | ORAL_TABLET | Freq: Every day | ORAL | 3 refills | Status: DC

## 2017-04-24 MED ORDER — MAGNESIUM SULFATE 2 GM/50ML IV SOLN
2.0000 g | Freq: Once | INTRAVENOUS | Status: AC
  Administered 2017-04-24: 2 g via INTRAVENOUS
  Filled 2017-04-24: qty 50

## 2017-04-24 MED ORDER — ASPIRIN 81 MG PO CHEW: 81.0000 mg | CHEWABLE_TABLET | Freq: Every day | ORAL | 0 refills | Status: DC

## 2017-04-24 MED ORDER — POTASSIUM CHLORIDE CRYS ER 20 MEQ PO TBCR
40.0000 meq | EXTENDED_RELEASE_TABLET | Freq: Once | ORAL | Status: AC
  Administered 2017-04-24: 40 meq via ORAL
  Filled 2017-04-24: qty 2

## 2017-04-24 NOTE — Progress Notes (Signed)
Being discharged to home. All IV access removed and patient dressed himself. Will take out by wheelchair.

## 2017-04-24 NOTE — Discharge Summary (Signed)
Discharge Summary  Lawrence Jordan:[id number] DOB: 05-24-1950  PCP: [name], MD  Admit date: 04/22/2017 Discharge date: 04/24/2017  Time spent: <72mins  Recommendations for Outpatient Follow-up:  1. F/u with PMD within a week  for hospital discharge follow up, repeat cbc/bmp at follow up 2. F/u with cardiology Dr [name] on 4/5, cardiology continue monitor lasix dose   Discharge Diagnoses:  Active Hospital Problems   Diagnosis Date Noted  . Atrial flutter with rapid ventricular response ([place]) 11/01/2012  . SVT (supraventricular tachycardia) ([place]) 04/22/2017  . Acute respiratory failure with hypoxia ([place]) 04/22/2017  . Acute on chronic systolic CHF (congestive heart failure) ([place]) 04/22/2017  . DKA, type 2 ([place]) 04/22/2017  . Persistent atrial fibrillation ([place]) 04/22/2017  . Acute pulmonary edema ([facility])   . Chest pain   . OSA (obstructive sleep apnea) 03/22/2016  . Type 2 diabetes mellitus ([employer])   . Acute edema of lung ([place]) 10/31/2012  . Hypertension   . Hypercholesterolemia     Resolved Hospital Problems  No resolved problems to display.    Discharge Condition: stable  Diet recommendation: heart healthy/carb modified  Filed Weights   04/22/17 0635 04/23/17 0400 04/24/17 0500  Weight: (!) 156.6 kg (345 lb 3.9 oz) (!) 155.1 kg (341 lb 14.9 oz) (!) 152 kg (335 lb 1.6 oz)    History of present illness: (per admitting MD Dr Manuella Ghazi) PCP: [name], MD   Patient coming from: Home  Chief Complaint: CP/dyspnea  HPI: Lawrence Jordan is a 67 y.o. male with medical history significant for CAD, hypertension, dyslipidemia, GERD, obesity, type 2 diabetes, and persistent atrial fibrillation on Eliquis, who states that over the last couple days has been experiencing intermittent episodes of sudden onset shortness of breath and chest pain that would quickly and spontaneously abate.  He recently saw his EP physician on 3/27 and was told to come off sotalol as he had  refractory atrial fibrillation and was told to start amiodarone today with increase in metoprolol to 100 mg twice a day.  He states that he had 2 episodes of CP/SOB approximately 2 days ago and another 2 episodes just yesterday, but had approximately 4 episodes this morning and decided to lay down with his CPAP mask and noticed immediate worsening.  His wife brought him to the emergency department for further evaluation.  He states that he was also recently switched to [place] as his long-acting insulin approximately 1 week ago and read that tachyarrhythmias can be a side effect of this medication. He denies any nausea, vomiting, abdominal pain, or diaphoresis.  No lower extremity edema.   ED Course: Vital signs initially demonstrated an unstable tachyarrhythmia of SVT, but more likely atrial flutter for which he underwent emergent cardioversion with conversion to sinus rhythm.  He now has stable vital signs and is much less symptomatic, but remains tachypneic for which he remains on BiPAP.  Chest x-ray demonstrates some alveolar pulmonary edema and small right sided pleural effusion with cardiomegaly.  His BNP is 81 and initial troponin is within normal limits.  Other laboratory data demonstrate leukocytosis of 15,700, creatinine of 1.13 with baseline of 0.86 and glucose of 293.  Patient has been started on IV insulin with concerns for DKA.  He has also been started on potassium supplementation due to insulin drip.  ED physician spoke with cardiologist on call who states that there appears to be no sign of ACS on EKG and feels that the patient could remain in stepdown  unit at [facility] with further evaluation by cardiology in a.m.     Hospital Course:  Principal Problem:   Atrial flutter with rapid ventricular response (HCC) Active Problems:   Hypercholesterolemia   Hypertension   Acute edema of lung (HCC)   Type 2 diabetes mellitus (HCC)   OSA (obstructive sleep apnea)   SVT (supraventricular  tachycardia) (HCC)   Acute respiratory failure with hypoxia (HCC)   Acute on chronic systolic CHF (congestive heart failure) (HCC)   DKA, type 2 (HCC)   Persistent atrial fibrillation (HCC)   Acute pulmonary edema (HCC)   Chest pain  Acute respiratory failure with hypoxia -Secondary to pulmonary edema/CHF -Initially placed on BiPAP -Wean oxygen back to room air for saturation greater 90%  Acute on chronic systolic CHF -09/02/2117 TEE EF 40-45%, diffuse HK, mild to moderate MR. -Continue IV Lasix -Daily weights -Strict I's and O's  Persistent atrial fibrillation -Status post cardioversion in the emergency department as discussed above ---03/28/2017 TEE EF 40-45%, diffuse HK, mild to moderate MR. -he is started on amiodarone drip x24hrs, then changed to oral amiodarone 200mg  bid per cardiology recommendation --Continue metoprolol tartrate -Presently in sinus rhythm -Continue apixaban -TSH 2.246 -he is to follow up with cardiology on 4/5  Acute on chronic Combined CHF exacerbation: -he received iv lasix 40mg  daily in the hospital -he is discharged on oral lasix 40mg  daily ( was on 20mg  daily at home) -he is to follow up with cardiology closely. Per cardiology plan to repeat echo in a few months once heart rate better controlled.   Hypokalemia: replace k,    DKA, type II ---patient started on IV insulin with q 1 hour CBG check and q 4 hour BMPs -pt started on aggressive fluid resuscitation/insulin drip --transitioned to [place] insulin once anion gap closed --03/29/2017 HbA1C--8.2 -Holding Jardiance, metformin, Ozempic held in the hospital, resumed at discharge. He is to follow up with endocrinology Dr Dwyane Dee on 4/15   Essential hypertension -Continue metoprolol tartrate  Morbid obesity -BMI 49.5 -lifestyle modification  OSA -Continue nighttime CPAP  Hyperlipidemia -Continue statin    Exertional chest pain -cardiology consult - troponins mild and flat, per  cardiology , this is likely from cardioversion -EKG--sinus ST depression v4-6, I, II, aVL -start asa 81mg  daily, continue statin.    Disposition Plan:   Home  Family Communication:  Patient    Consultants:  cardiology  Code Status:  FULL  DVT Prophylaxis: apixaban   Procedures: As Listed in Progress Note Above  Antibiotics: None   Discharge Exam: BP 122/70   Pulse 65   Temp 98.1 F (36.7 C) (Axillary)   Resp 19   Ht 5\' 10"  (1.778 m)   Wt (!) 152 kg (335 lb 1.6 oz)   SpO2 95%   BMI 48.08 kg/m   General: NAD Cardiovascular: RRR Respiratory: CTABL Extremity: bilateral ankle /pedal edema improving  Discharge Instructions You were cared for by a hospitalist during your hospital stay. If you have any questions about your discharge medications or the care you received while you were in the hospital after you are discharged, you can call the unit and asked to speak with the hospitalist on call if the hospitalist that took care of you is not available. Once you are discharged, your primary care physician will handle any further medical issues. Please note that NO REFILLS for any discharge medications will be authorized once you are discharged, as it is imperative that you return to your primary care physician (  or establish a relationship with a primary care physician if you do not have one) for your aftercare needs so that they can reassess your need for medications and monitor your lab values.  Discharge Instructions    Diet - low sodium heart healthy   Complete by:  As directed    Carb modified   Discharge instructions   Complete by:  As directed    Please weight your self daily, please call your cardiologist  if weight go up 3pounds in one day or 5pounds in one week.   Increase activity slowly   Complete by:  As directed      Allergies as of 04/24/2017      Reactions   Iodinated Diagnostic Agents Anaphylaxis   Sulfa Antibiotics Anaphylaxis, Rash    Adhesive [tape] Other (See Comments)   Blisters PAPER TAPE ONLY   Amoxicillin Hives, Other (See Comments)   Has patient had a PCN reaction causing immediate rash, facial/tongue/throat swelling, SOB or lightheadedness with hypotension: No Has patient had a PCN reaction causing severe rash involving mucus membranes or skin necrosis:Yes--blisters around mouth Has patient had a PCN reaction that required hospitalization: No Has patient had a PCN reaction occurring within the last 10 years: Yes If all of the above answers are "NO", then may proceed with Cephalosporin use.      Medication List    TAKE these medications   acetaminophen 500 MG tablet Commonly known as:  TYLENOL Take 1,000 mg by mouth 2 (two) times daily.   amiodarone 200 MG tablet Commonly known as:  PACERONE Take 1 tablet (200 mg total) by mouth 2 (two) times daily.   apixaban 5 MG Tabs tablet Commonly known as:  ELIQUIS TAKE 1 TABLET (5 MG TOTAL) BY MOUTH 2 (TWO) TIMES DAILY.   aspirin 81 MG chewable tablet Chew 1 tablet (81 mg total) by mouth daily. Start taking on:  04/25/2017   B-D UF III MINI PEN NEEDLES 31G X 5 MM Misc Generic drug:  Insulin Pen Needle USE FOUR TIMES DAILY  TO  INJECT  INSULIN   celecoxib 200 MG capsule Commonly known as:  CELEBREX Take 200 mg by mouth at bedtime.   cholecalciferol 1000 units tablet Commonly known as:  VITAMIN D Take 1,000 Units by mouth daily.   furosemide 40 MG tablet Commonly known as:  LASIX Take 1 tablet (40 mg total) by mouth daily. What changed:  how much to take   insulin aspart 100 UNIT/ML FlexPen Commonly known as:  NOVOLOG FLEXPEN 16 U ac tid   Insulin Degludec 200 UNIT/ML Sopn Commonly known as:  TRESIBA FLEXTOUCH Inject 60 Units into the skin daily. Please do not fill Toujeo Rx. This is to replace Toujeo. Thanks!   JARDIANCE 25 MG Tabs tablet Generic drug:  empagliflozin TAKE 1 TABLET EVERY DAY What changed:    how much to take  how to take  this  when to take this   KLOR-CON M20 20 MEQ tablet Generic drug:  potassium chloride SA TAKE 1 TABLET TWICE DAILY What changed:    how much to take  how to take this  when to take this   Magnesium 200 MG Tabs Take 1 tablet (200 mg total) by mouth daily. What changed:  when to take this   metFORMIN 500 MG 24 hr tablet Commonly known as:  GLUCOPHAGE-XR 2 tabs with breakfast 2 tabs with dinner What changed:    how much to take  how to take this  when to take this  additional instructions   metoprolol tartrate 50 MG tablet Commonly known as:  LOPRESSOR Take 2 tablets (100 mg total) by mouth 2 (two) times daily.   multivitamin tablet Take 1 tablet by mouth daily.   ONETOUCH DELICA LANCETS FINE Misc USE TO CHECK BLOOD SUGAR 2 TIMES PER DAY dx code E11.9   pantoprazole 40 MG tablet Commonly known as:  PROTONIX TAKE 1 TABLET (40 MG TOTAL) BY MOUTH DAILY. What changed:  when to take this   Semaglutide 0.25 or 0.5 MG/DOSE Sopn Commonly known as:  OZEMPIC Inject 0.5 mg into the skin once a week.   simvastatin 20 MG tablet Commonly known as:  ZOCOR TAKE 1 TABLET (20 MG TOTAL) BY MOUTH DAILY AT 6 PM. What changed:  when to take this   triamcinolone cream 0.1 % Commonly known as:  KENALOG Apply 1 application topically 3 (three) times daily as needed (for skin irritation).   VICTOZA 18 MG/3ML Sopn Generic drug:  liraglutide INJECT  1.8MG  SUBCUTANEOUSLY EVERY DAY      Allergies  Allergen Reactions  . Iodinated Diagnostic Agents Anaphylaxis  . Sulfa Antibiotics Anaphylaxis and Rash  . Adhesive [Tape] Other (See Comments)    Blisters PAPER TAPE ONLY  . Amoxicillin Hives and Other (See Comments)    Has patient had a PCN reaction causing immediate rash, facial/tongue/throat swelling, SOB or lightheadedness with hypotension: No Has patient had a PCN reaction causing severe rash involving mucus membranes or skin necrosis:Yes--blisters around mouth Has patient  had a PCN reaction that required hospitalization: No Has patient had a PCN reaction occurring within the last 10 years: Yes If all of the above answers are "NO", then may proceed with Cephalosporin use.    Follow-up Information    [name], MD Follow up in 1 week(s).   Specialty:  Family Medicine Why:  hospital discharge follow up. Contact information: [place] [address] [phone]        [name], MD Follow up on 04/29/2017.   Specialty:  Cardiology Why:  for afib and heart failure management. repeat bmp at follow up.  Contact information: [place] [address] [phone]            The results of significant diagnostics from this hospitalization (including imaging, microbiology, ancillary and laboratory) are listed below for reference.    Significant Diagnostic Studies: Dg Chest Port 1 View  Result Date: 04/22/2017 CLINICAL DATA:  67 y/o M; 1 hour of chest pain and shortness of breath. EXAM: PORTABLE CHEST 1 VIEW COMPARISON:  07/27/2016 chest radiograph. FINDINGS: Stable mild cardiomegaly given projection and technique. Aortic atherosclerosis. Diffuse reticular and patchy opacities of the lungs. Transcutaneous pacing pads noted. Trace fluid on the right minor fissure. Bones are unremarkable. IMPRESSION: Alveolar pulmonary edema. Small right effusion. Stable mild cardiomegaly. Electronically Signed   By: Kristine Garbe M.D.   On: 04/22/2017 02:46    Microbiology: Recent Results (from the past 240 hour(s))  MRSA PCR Screening     Status: None   Collection Time: 04/22/17  6:20 AM  Result Value Ref Range Status   MRSA by PCR NEGATIVE NEGATIVE Final    Comment:        The GeneXpert MRSA Assay (FDA approved for NASAL specimens only), is one component of a comprehensive MRSA colonization surveillance program. It is not intended to diagnose MRSA infection nor to guide or monitor treatment for MRSA  infections. Performed  at [facility], [address], [place], [place] [address]      Labs: Basic Metabolic Panel: Recent Labs  Lab 04/22/17 1228 04/22/17 1630 04/22/17 2033 04/23/17 0524 04/24/17 0356  NA 136 138 139 138 138  K 4.0 3.8 3.5 3.3* 3.6  CL 101 103 104 103 103  CO2 20* 21* 22 23 23   GLUCOSE 174* 136* 135* 163* 160*  BUN 22* 22* 23* 25* 28*  CREATININE 0.87 0.81 0.72 0.73 0.67  CALCIUM 9.8 9.7 9.3 8.7* 8.9  MG 1.7  --   --   --  1.8   Liver Function Tests: Recent Labs  Lab 04/20/17 1232 04/22/17 0223 04/24/17 0356  AST 60* 76* 61*  ALT 45 74* 62  ALKPHOS 50 89 39  BILITOT 1.1 0.7 0.7  PROT 6.8 9.0* 6.9  ALBUMIN 3.9 4.7 3.8   No results for input(s): LIPASE, AMYLASE in the last 168 hours. No results for input(s): AMMONIA in the last 168 hours. CBC: Recent Labs  Lab 04/22/17 0223 04/23/17 0524  WBC 15.7* 7.7  NEUTROABS 4.6  --   HGB 16.2 11.9*  HCT 51.5 38.6*  MCV 91.6 89.8  PLT 256 184   Cardiac Enzymes: Recent Labs  Lab 04/22/17 0223 04/22/17 0735 04/22/17 1228 04/22/17 1427 04/22/17 1630  TROPONINI <0.03 0.38* 0.46* 0.41* 0.40*   BNP: BNP (last 3 results) Recent Labs    04/22/17 0223  BNP 81.0    ProBNP (last 3 results) No results for input(s): PROBNP in the last 8760 hours.  CBG: Recent Labs  Lab 04/23/17 1110 04/23/17 1611 04/23/17 1929 04/23/17 2347 04/24/17 0407  GLUCAP 176* 145* 240* 157* 154*       Signed:  Florencia Reasons MD, PhD  [facility] 04/24/2017, 10:32 AM

## 2017-04-25 ENCOUNTER — Encounter (INDEPENDENT_AMBULATORY_CARE_PROVIDER_SITE_OTHER): Payer: Self-pay

## 2017-04-25 LAB — GLUCOSE, CAPILLARY: Glucose-Capillary: 172 mg/dL — ABNORMAL HIGH (ref 65–99)

## 2017-05-02 ENCOUNTER — Ambulatory Visit (HOSPITAL_COMMUNITY)
Admission: RE | Admit: 2017-05-02 | Discharge: 2017-05-02 | Disposition: A | Discharge status: Home / Self Care | Payer: Medicare Other | Source: Ambulatory Visit | Attending: Nurse Practitioner | Admitting: Nurse Practitioner

## 2017-05-02 ENCOUNTER — Encounter (HOSPITAL_COMMUNITY): Payer: Self-pay | Admitting: Nurse Practitioner

## 2017-05-02 VITALS — BP 122/70 | HR 55 | Ht 70.0 in | Wt 339.6 lb

## 2017-05-02 DIAGNOSIS — E119 Type 2 diabetes mellitus without complications: Secondary | ICD-10-CM | POA: Insufficient documentation

## 2017-05-02 DIAGNOSIS — I4892 Unspecified atrial flutter: Secondary | ICD-10-CM | POA: Diagnosis not present

## 2017-05-02 DIAGNOSIS — R739 Hyperglycemia, unspecified: Secondary | ICD-10-CM | POA: Diagnosis not present

## 2017-05-02 DIAGNOSIS — E669 Obesity, unspecified: Secondary | ICD-10-CM | POA: Insufficient documentation

## 2017-05-02 DIAGNOSIS — I4819 Other persistent atrial fibrillation: Secondary | ICD-10-CM

## 2017-05-02 DIAGNOSIS — K219 Gastro-esophageal reflux disease without esophagitis: Secondary | ICD-10-CM | POA: Insufficient documentation

## 2017-05-02 DIAGNOSIS — I4581 Long QT syndrome: Secondary | ICD-10-CM | POA: Diagnosis not present

## 2017-05-02 DIAGNOSIS — J90 Pleural effusion, not elsewhere classified: Secondary | ICD-10-CM | POA: Insufficient documentation

## 2017-05-02 DIAGNOSIS — I481 Persistent atrial fibrillation: Secondary | ICD-10-CM | POA: Diagnosis not present

## 2017-05-02 DIAGNOSIS — Z7982 Long term (current) use of aspirin: Secondary | ICD-10-CM | POA: Insufficient documentation

## 2017-05-02 DIAGNOSIS — Z882 Allergy status to sulfonamides status: Secondary | ICD-10-CM | POA: Insufficient documentation

## 2017-05-02 DIAGNOSIS — I2510 Atherosclerotic heart disease of native coronary artery without angina pectoris: Secondary | ICD-10-CM | POA: Insufficient documentation

## 2017-05-02 DIAGNOSIS — Z880 Allergy status to penicillin: Secondary | ICD-10-CM | POA: Diagnosis not present

## 2017-05-02 DIAGNOSIS — E7800 Pure hypercholesterolemia, unspecified: Secondary | ICD-10-CM | POA: Insufficient documentation

## 2017-05-02 DIAGNOSIS — I471 Supraventricular tachycardia: Secondary | ICD-10-CM | POA: Insufficient documentation

## 2017-05-02 DIAGNOSIS — I10 Essential (primary) hypertension: Secondary | ICD-10-CM | POA: Diagnosis not present

## 2017-05-02 DIAGNOSIS — J811 Chronic pulmonary edema: Secondary | ICD-10-CM | POA: Insufficient documentation

## 2017-05-02 DIAGNOSIS — M1990 Unspecified osteoarthritis, unspecified site: Secondary | ICD-10-CM | POA: Insufficient documentation

## 2017-05-02 DIAGNOSIS — Z9889 Other specified postprocedural states: Secondary | ICD-10-CM | POA: Diagnosis not present

## 2017-05-02 DIAGNOSIS — E785 Hyperlipidemia, unspecified: Secondary | ICD-10-CM | POA: Diagnosis not present

## 2017-05-02 DIAGNOSIS — R0600 Dyspnea, unspecified: Secondary | ICD-10-CM | POA: Insufficient documentation

## 2017-05-02 DIAGNOSIS — R001 Bradycardia, unspecified: Secondary | ICD-10-CM | POA: Insufficient documentation

## 2017-05-02 DIAGNOSIS — G4733 Obstructive sleep apnea (adult) (pediatric): Secondary | ICD-10-CM | POA: Diagnosis not present

## 2017-05-02 DIAGNOSIS — Z96641 Presence of right artificial hip joint: Secondary | ICD-10-CM | POA: Insufficient documentation

## 2017-05-02 DIAGNOSIS — Z79899 Other long term (current) drug therapy: Secondary | ICD-10-CM | POA: Insufficient documentation

## 2017-05-02 DIAGNOSIS — Z794 Long term (current) use of insulin: Secondary | ICD-10-CM | POA: Insufficient documentation

## 2017-05-02 LAB — CBC
HCT: 41.8 % (ref 39.0–52.0)
Hemoglobin: 13.3 g/dL (ref 13.0–17.0)
MCH: 28.3 pg (ref 26.0–34.0)
MCHC: 31.8 g/dL (ref 30.0–36.0)
MCV: 88.9 fL (ref 78.0–100.0)
Platelets: 208 10*3/uL (ref 150–400)
RBC: 4.7 MIL/uL (ref 4.22–5.81)
RDW: 15.4 % (ref 11.5–15.5)
WBC: 6.9 10*3/uL (ref 4.0–10.5)

## 2017-05-02 LAB — COMPREHENSIVE METABOLIC PANEL
ALT: 47 U/L (ref 17–63)
AST: 43 U/L — ABNORMAL HIGH (ref 15–41)
Albumin: 4.3 g/dL (ref 3.5–5.0)
Alkaline Phosphatase: 54 U/L (ref 38–126)
Anion gap: 11 (ref 5–15)
BUN: 13 mg/dL (ref 6–20)
CO2: 22 mmol/L (ref 22–32)
Calcium: 9.6 mg/dL (ref 8.9–10.3)
Chloride: 104 mmol/L (ref 101–111)
Creatinine, Ser: 0.86 mg/dL (ref 0.61–1.24)
GFR calc Af Amer: >60 mL/min (ref >60)
GFR calc non Af Amer: >60 mL/min (ref >60)
Glucose, Bld: 85 mg/dL (ref 65–99)
Potassium: 4.2 mmol/L (ref 3.5–5.1)
Sodium: 137 mmol/L (ref 135–145)
Total Bilirubin: 0.7 mg/dL (ref 0.3–1.2)
Total Protein: 7.8 g/dL (ref 6.5–8.1)
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
## 2017-05-04 ENCOUNTER — Encounter: Payer: Self-pay | Admitting: Internal Medicine

## 2017-05-04 ENCOUNTER — Ambulatory Visit (INDEPENDENT_AMBULATORY_CARE_PROVIDER_SITE_OTHER): Payer: Medicare Other | Admitting: Internal Medicine

## 2017-05-04 VITALS — BP 128/68 | HR 60 | Ht 70.0 in | Wt 339.0 lb

## 2017-05-04 DIAGNOSIS — I481 Persistent atrial fibrillation: Secondary | ICD-10-CM

## 2017-05-04 DIAGNOSIS — G4733 Obstructive sleep apnea (adult) (pediatric): Secondary | ICD-10-CM | POA: Diagnosis not present

## 2017-05-04 DIAGNOSIS — I4819 Other persistent atrial fibrillation: Secondary | ICD-10-CM

## 2017-05-04 DIAGNOSIS — I10 Essential (primary) hypertension: Secondary | ICD-10-CM

## 2017-05-04 DIAGNOSIS — I484 Atypical atrial flutter: Secondary | ICD-10-CM | POA: Diagnosis not present

## 2017-05-04 MED ORDER — METOPROLOL TARTRATE 50 MG PO TABS: 75.0000 mg | ORAL_TABLET | Freq: Two times a day (BID) | ORAL | 3 refills | Status: DC

## 2017-05-04 MED ORDER — AMIODARONE HCL 200 MG PO TABS: 200.0000 mg | ORAL_TABLET | Freq: Every day | ORAL | 3 refills | Status: DC

## 2017-05-04 NOTE — Patient Instructions (Addendum)
Medication Instructions:  Your physician has recommended you make the following change in your medication:  1.  Increase your lasix 40 mg to one tablet by mouth twice a day for 3 days.  Then return to your normal dosage. 2.  Decrease your metoprolol tartrate 50 mg.  Take 1.5 tablets by mouth twice a day (75 mg). 3.  In 14 days (roughly May 17, 2017) reduce your amiodarone 200 mg to one tablet by mouth daily.  Labwork: None ordered.  Testing/Procedures: None ordered.  Follow-Up: Your physician wants you to follow-up in: 3 weeks with Dr. [name].   You will receive a reminder letter in the mail two months in advance. If you don't receive a letter, please call our office to schedule the follow-up appointment.  Any Other Special Instructions Will Be Listed Below (If Applicable).  If you need a refill on your cardiac medications before your next appointment, please call your pharmacy.
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
## 2017-05-09 ENCOUNTER — Encounter: Payer: Self-pay | Admitting: Endocrinology

## 2017-05-09 ENCOUNTER — Ambulatory Visit (INDEPENDENT_AMBULATORY_CARE_PROVIDER_SITE_OTHER): Payer: Medicare Other | Admitting: Endocrinology

## 2017-05-09 ENCOUNTER — Encounter: Payer: Medicare Other | Admitting: Internal Medicine

## 2017-05-09 VITALS — BP 124/70 | HR 55 | Ht 70.0 in | Wt 336.2 lb

## 2017-05-09 DIAGNOSIS — E1165 Type 2 diabetes mellitus with hyperglycemia: Secondary | ICD-10-CM

## 2017-05-09 DIAGNOSIS — Z794 Long term (current) use of insulin: Secondary | ICD-10-CM
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
## 2017-05-09 NOTE — Patient Instructions (Signed)
Reduce Tresiba to 56 if am sugar stays < 110

## 2017-05-10 ENCOUNTER — Other Ambulatory Visit: Payer: Self-pay

## 2017-05-10 MED ORDER — ONETOUCH DELICA LANCETS FINE MISC: 5 refills | Status: DC

## 2017-05-12 ENCOUNTER — Other Ambulatory Visit (HOSPITAL_COMMUNITY): Payer: Self-pay | Admitting: Internal Medicine

## 2017-05-18 ENCOUNTER — Encounter: Payer: Self-pay | Admitting: Endocrinology

## 2017-05-19 ENCOUNTER — Other Ambulatory Visit: Payer: Self-pay

## 2017-05-19 MED ORDER — INSULIN DEGLUDEC 200 UNIT/ML ~~LOC~~ SOPN: 60.0000 [IU] | PEN_INJECTOR | Freq: Every day | SUBCUTANEOUS | 2 refills | Status: DC

## 2017-05-22 ENCOUNTER — Other Ambulatory Visit: Payer: Self-pay | Admitting: Endocrinology

## 2017-05-24 DIAGNOSIS — I482 Chronic atrial fibrillation: Secondary | ICD-10-CM | POA: Diagnosis not present

## 2017-05-24 DIAGNOSIS — Z6841 Body Mass Index (BMI) 40.0 and over, adult: Secondary | ICD-10-CM | POA: Diagnosis not present

## 2017-05-27 ENCOUNTER — Telehealth: Payer: Self-pay | Admitting: *Deleted

## 2017-05-27 ENCOUNTER — Encounter: Payer: Self-pay | Admitting: Internal Medicine

## 2017-05-27 ENCOUNTER — Telehealth: Payer: Self-pay | Admitting: Internal Medicine

## 2017-05-27 ENCOUNTER — Encounter: Payer: Medicare Other | Admitting: Internal Medicine

## 2017-05-27 ENCOUNTER — Ambulatory Visit (INDEPENDENT_AMBULATORY_CARE_PROVIDER_SITE_OTHER): Payer: Medicare Other | Admitting: Internal Medicine

## 2017-05-27 VITALS — BP 118/78 | HR 56 | Ht 71.0 in | Wt 335.0 lb

## 2017-05-27 DIAGNOSIS — I4819 Other persistent atrial fibrillation: Secondary | ICD-10-CM

## 2017-05-27 DIAGNOSIS — I509 Heart failure, unspecified: Secondary | ICD-10-CM

## 2017-05-27 DIAGNOSIS — G4733 Obstructive sleep apnea (adult) (pediatric): Secondary | ICD-10-CM

## 2017-05-27 DIAGNOSIS — I481 Persistent atrial fibrillation: Secondary | ICD-10-CM | POA: Diagnosis not present

## 2017-05-27 MED ORDER — METOPROLOL TARTRATE 50 MG PO TABS: 50.0000 mg | ORAL_TABLET | Freq: Two times a day (BID) | ORAL | 3 refills | Status: DC

## 2017-05-27 NOTE — Telephone Encounter (Signed)
Echo in [facility] July 27, 2017

## 2017-05-27 NOTE — Patient Instructions (Addendum)
Medication Instructions:   Your physician has recommended you make the following change in your medication:   Decrease metoprolol to 50 mg by mouth twice daily.  Stop aspirin  Continue all other medications the same.  Labwork:  NONE  Testing/Procedures: Your physician has requested that you have an echocardiogram in 2 months just before your next visit. Echocardiography is a painless test that uses sound waves to create images of your heart. It provides your doctor with information about the size and shape of your heart and how well your heart's chambers and valves are working. This procedure takes approximately one hour. There are no restrictions for this procedure.  Follow-Up: Your physician recommends that you schedule a follow-up appointment in: 2 months with Dr. [name] on 07/29/17. Please schedule this appointment today before leaving the office.  Any Other Special Instructions Will Be Listed Below (If Applicable).  If you need a refill on your cardiac medications before your next appointment, please call your pharmacy.

## 2017-05-27 NOTE — Telephone Encounter (Signed)
PA initiated for amiodarone 200 mg daily to pt insurance phone # [phone] PA # [id number]
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
## 2017-05-30 NOTE — Telephone Encounter (Signed)
PA approved for amiodarone through 05/29/18 - approval letter sent for scanning

## 2017-05-31 NOTE — Addendum Note (Signed)
Addended by: Acquanetta Chain on: 05/31/2017 08:40 AM   Modules accepted: Orders

## 2017-06-07 ENCOUNTER — Other Ambulatory Visit: Payer: Self-pay

## 2017-06-07 MED ORDER — INSULIN DEGLUDEC 200 UNIT/ML ~~LOC~~ SOPN: 60.0000 [IU] | PEN_INJECTOR | Freq: Every day | SUBCUTANEOUS | 2 refills | Status: DC

## 2017-06-09 ENCOUNTER — Other Ambulatory Visit: Payer: Self-pay

## 2017-06-09 MED ORDER — INSULIN DEGLUDEC 200 UNIT/ML ~~LOC~~ SOPN: 60.0000 [IU] | PEN_INJECTOR | Freq: Every day | SUBCUTANEOUS | 2 refills | Status: DC

## 2017-06-15 ENCOUNTER — Encounter: Payer: Self-pay | Admitting: Internal Medicine

## 2017-06-15 ENCOUNTER — Other Ambulatory Visit: Payer: Self-pay

## 2017-06-15 MED ORDER — METOPROLOL TARTRATE 50 MG PO TABS: 25.0000 mg | ORAL_TABLET | Freq: Two times a day (BID) | ORAL | 3 refills | Status: DC

## 2017-06-15 NOTE — Progress Notes (Signed)
Per Dr. [name], have Pt reduce metoprolol to 25 mg bid for fatigue.  See MyChart message.

## 2017-06-16 ENCOUNTER — Other Ambulatory Visit: Payer: Self-pay

## 2017-06-16 MED ORDER — INSULIN DEGLUDEC 200 UNIT/ML ~~LOC~~ SOPN: 60.0000 [IU] | PEN_INJECTOR | Freq: Every day | SUBCUTANEOUS | 2 refills | Status: DC

## 2017-06-27 DIAGNOSIS — I10 Essential (primary) hypertension: Secondary | ICD-10-CM | POA: Diagnosis not present

## 2017-06-27 DIAGNOSIS — E1165 Type 2 diabetes mellitus with hyperglycemia: Secondary | ICD-10-CM | POA: Diagnosis not present

## 2017-06-27 DIAGNOSIS — G4730 Sleep apnea, unspecified: Secondary | ICD-10-CM | POA: Diagnosis not present

## 2017-06-27 DIAGNOSIS — Z6841 Body Mass Index (BMI) 40.0 and over, adult: Secondary | ICD-10-CM | POA: Diagnosis not present

## 2017-06-27 DIAGNOSIS — Z23 Encounter for immunization: Secondary | ICD-10-CM | POA: Diagnosis not present

## 2017-06-27 DIAGNOSIS — I482 Chronic atrial fibrillation: Secondary | ICD-10-CM | POA: Diagnosis not present

## 2017-06-27 DIAGNOSIS — Z0001 Encounter for general adult medical examination with abnormal findings: Secondary | ICD-10-CM | POA: Diagnosis not present

## 2017-06-29 ENCOUNTER — Ambulatory Visit (INDEPENDENT_AMBULATORY_CARE_PROVIDER_SITE_OTHER): Payer: Medicare Other | Admitting: Endocrinology

## 2017-06-29 ENCOUNTER — Other Ambulatory Visit: Payer: Self-pay | Admitting: *Deleted

## 2017-06-29 ENCOUNTER — Encounter: Payer: Self-pay | Admitting: Endocrinology

## 2017-06-29 VITALS — BP 140/78 | HR 56 | Ht 71.0 in | Wt 331.4 lb

## 2017-06-29 DIAGNOSIS — Z794 Long term (current) use of insulin: Secondary | ICD-10-CM

## 2017-06-29 DIAGNOSIS — E1165 Type 2 diabetes mellitus with hyperglycemia: Secondary | ICD-10-CM | POA: Diagnosis not present

## 2017-06-29 LAB — POCT GLYCOSYLATED HEMOGLOBIN (HGB A1C): Hemoglobin A1C: 6.2 % — AB (ref 4.0–5.6)

## 2017-06-29 LAB — GLUCOSE, POCT (MANUAL RESULT ENTRY): POC Glucose: 128 mg/dl — AB (ref 70–99)

## 2017-06-29 MED ORDER — AMIODARONE HCL 200 MG PO TABS: 200.0000 mg | ORAL_TABLET | Freq: Every day | ORAL | 3 refills | Status: DC

## 2017-06-29 MED ORDER — METFORMIN HCL 1000 MG PO TABS: 1000.0000 mg | ORAL_TABLET | Freq: Two times a day (BID) | ORAL | 3 refills | Status: DC

## 2017-06-29 NOTE — Patient Instructions (Signed)
Metformin 1g, 1 at dinner and 1 at bedtime  If sugars > 130 in am then 64 Tresiba  18 Novolog for most meals
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
## 2017-06-30 DIAGNOSIS — Z7984 Long term (current) use of oral hypoglycemic drugs: Secondary | ICD-10-CM | POA: Diagnosis not present

## 2017-06-30 DIAGNOSIS — E119 Type 2 diabetes mellitus without complications: Secondary | ICD-10-CM | POA: Diagnosis not present

## 2017-06-30 DIAGNOSIS — H40013 Open angle with borderline findings, low risk, bilateral: Secondary | ICD-10-CM | POA: Diagnosis not present

## 2017-06-30 DIAGNOSIS — Z794 Long term (current) use of insulin: Secondary | ICD-10-CM | POA: Diagnosis not present

## 2017-07-09 ENCOUNTER — Encounter: Payer: Self-pay | Admitting: Endocrinology

## 2017-07-11 ENCOUNTER — Other Ambulatory Visit: Payer: Self-pay

## 2017-07-11 MED ORDER — INSULIN ASPART 100 UNIT/ML FLEXPEN: PEN_INJECTOR | SUBCUTANEOUS | 3 refills | Status: DC

## 2017-07-27 ENCOUNTER — Other Ambulatory Visit: Payer: Self-pay

## 2017-07-27 ENCOUNTER — Ambulatory Visit (INDEPENDENT_AMBULATORY_CARE_PROVIDER_SITE_OTHER): Payer: Medicare Other

## 2017-07-27 DIAGNOSIS — I509 Heart failure, unspecified: Secondary | ICD-10-CM | POA: Diagnosis not present

## 2017-07-29 ENCOUNTER — Other Ambulatory Visit: Payer: Self-pay

## 2017-07-29 ENCOUNTER — Ambulatory Visit (INDEPENDENT_AMBULATORY_CARE_PROVIDER_SITE_OTHER): Payer: Medicare Other | Admitting: Internal Medicine

## 2017-07-29 ENCOUNTER — Encounter (INDEPENDENT_AMBULATORY_CARE_PROVIDER_SITE_OTHER): Payer: Self-pay

## 2017-07-29 ENCOUNTER — Encounter: Payer: Self-pay | Admitting: Internal Medicine

## 2017-07-29 ENCOUNTER — Encounter: Payer: Self-pay | Admitting: *Deleted

## 2017-07-29 VITALS — BP 148/76 | HR 56 | Ht 71.0 in | Wt 332.0 lb

## 2017-07-29 DIAGNOSIS — G4733 Obstructive sleep apnea (adult) (pediatric): Secondary | ICD-10-CM | POA: Diagnosis not present

## 2017-07-29 DIAGNOSIS — R0602 Shortness of breath: Secondary | ICD-10-CM

## 2017-07-29 DIAGNOSIS — I481 Persistent atrial fibrillation: Secondary | ICD-10-CM | POA: Diagnosis not present

## 2017-07-29 DIAGNOSIS — I10 Essential (primary) hypertension: Secondary | ICD-10-CM | POA: Diagnosis not present

## 2017-07-29 DIAGNOSIS — I4819 Other persistent atrial fibrillation: Secondary | ICD-10-CM
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
## 2017-07-29 NOTE — Patient Instructions (Signed)
Medication Instructions:  Continue all current medications.  Labwork:  HFP, CBC, BMET, TSH, free T4  Office will contact with results via phone or letter.    Testing/Procedures:  Your physician has requested that you have a lexiscan myoview. For further information please visit [email or web address]. Please follow instruction sheet, as given.  Your physician has recommended that you have a pulmonary function test. Pulmonary Function Tests are a group of tests that measure how well air moves in and out of your lungs.  Office will contact with results via phone or letter.    Follow-Up: 4 months   Any Other Special Instructions Will Be Listed Below (If Applicable).  If you need a refill on your cardiac medications before your next appointment, please call your pharmacy.

## 2017-08-01 ENCOUNTER — Telehealth: Payer: Self-pay | Admitting: Internal Medicine

## 2017-08-01 NOTE — Telephone Encounter (Signed)
Pre-cert Verification for the following procedure    Lexiscan  & PFT's complete scheduled for 08/04/2017 @ [facility]

## 2017-08-04 ENCOUNTER — Ambulatory Visit (HOSPITAL_COMMUNITY)
Admission: RE | Admit: 2017-08-04 | Discharge: 2017-08-04 | Disposition: A | Discharge status: Home / Self Care | Payer: Medicare Other | Source: Ambulatory Visit | Attending: Internal Medicine | Admitting: Internal Medicine

## 2017-08-04 ENCOUNTER — Ambulatory Visit (HOSPITAL_COMMUNITY)
Admission: RE | Admit: 2017-08-04 | Discharge: 2017-08-04 | Disposition: A | Discharge status: Home / Self Care | Payer: Medicare Other | Source: Ambulatory Visit | Attending: Family Medicine | Admitting: Family Medicine

## 2017-08-04 ENCOUNTER — Encounter (HOSPITAL_COMMUNITY): Payer: Self-pay

## 2017-08-04 ENCOUNTER — Encounter (HOSPITAL_BASED_OUTPATIENT_CLINIC_OR_DEPARTMENT_OTHER)
Admission: RE | Admit: 2017-08-04 | Discharge: 2017-08-04 | Disposition: A | Discharge status: Home / Self Care | Payer: Medicare Other | Source: Ambulatory Visit | Attending: Internal Medicine | Admitting: Internal Medicine

## 2017-08-04 DIAGNOSIS — R0602 Shortness of breath: Secondary | ICD-10-CM | POA: Insufficient documentation

## 2017-08-04 DIAGNOSIS — R9439 Abnormal result of other cardiovascular function study: Secondary | ICD-10-CM | POA: Diagnosis not present

## 2017-08-04 HISTORY — DX: Heart failure, unspecified: I50.9

## 2017-08-04 HISTORY — DX: Systemic involvement of connective tissue, unspecified: M35.9

## 2017-08-04 LAB — PULMONARY FUNCTION TEST
DL/VA % pred: 77 %
DL/VA: 3.61 ml/min/mmHg/L
DLCO unc % pred: 62 %
DLCO unc: 20.92 ml/min/mmHg
FEF 25-75 Post: 3.35 L/sec
FEF 25-75 Pre: 2.47 L/sec
FEF2575-%Change-Post: 35 %
FEF2575-%Pred-Post: 122 %
FEF2575-%Pred-Pre: 90 %
FEV1-%Change-Post: 3 %
FEV1-%Pred-Post: 83 %
FEV1-%Pred-Pre: 81 %
FEV1-Post: 2.95 L
FEV1-Pre: 2.85 L
FEV1FVC-%Change-Post: 0 %
FEV1FVC-%Pred-Pre: 107 %
FEV6-%Change-Post: 2 %
FEV6-%Pred-Post: 80 %
FEV6-%Pred-Pre: 78 %
FEV6-Post: 3.62 L
FEV6-Pre: 3.53 L
FEV6FVC-%Change-Post: 0 %
FEV6FVC-%Pred-Post: 103 %
FEV6FVC-%Pred-Pre: 104 %
FVC-%Change-Post: 3 %
FVC-%Pred-Post: 78 %
FVC-%Pred-Pre: 75 %
FVC-Post: 3.68 L
FVC-Pre: 3.57 L
Post FEV1/FVC ratio: 80 %
Post FEV6/FVC ratio: 98 %
Pre FEV1/FVC ratio: 80 %
Pre FEV6/FVC Ratio: 99 %
RV % pred: 112 %
RV: 2.73 L
TLC % pred: 88 %
TLC: 6.42 L

## 2017-08-04 MED ORDER — TECHNETIUM TC 99M TETROFOSMIN IV KIT
10.0000 | PACK | Freq: Once | INTRAVENOUS | Status: AC | PRN
  Administered 2017-08-04: 10.3 via INTRAVENOUS

## 2017-08-04 MED ORDER — TECHNETIUM TC 99M TETROFOSMIN IV KIT
30.0000 | PACK | Freq: Once | INTRAVENOUS | Status: AC | PRN
  Administered 2017-08-04: 32 via INTRAVENOUS

## 2017-08-04 MED ORDER — REGADENOSON 0.4 MG/5ML IV SOLN
INTRAVENOUS | Status: AC
  Administered 2017-08-04: 0.4 mg via INTRAVENOUS
  Filled 2017-08-04: qty 5

## 2017-08-04 MED ORDER — SODIUM CHLORIDE 0.9% FLUSH
INTRAVENOUS | Status: AC
  Administered 2017-08-04: 10 mL via INTRAVENOUS
  Filled 2017-08-04: qty 10

## 2017-08-04 MED ORDER — ALBUTEROL SULFATE (2.5 MG/3ML) 0.083% IN NEBU
2.5000 mg | INHALATION_SOLUTION | Freq: Once | RESPIRATORY_TRACT | Status: AC
  Administered 2017-08-04: 2.5 mg via RESPIRATORY_TRACT

## 2017-08-04 MED ORDER — SODIUM CHLORIDE 0.9% FLUSH
INTRAVENOUS | Status: AC
  Filled 2017-08-04: qty 160

## 2017-08-05 LAB — NM MYOCAR MULTI W/SPECT W/WALL MOTION / EF
LV dias vol: 188 mL (ref 62–150)
LV sys vol: 55 mL
Peak HR: 57 {beats}/min
RATE: 0.6
Rest HR: 47 {beats}/min
SDS: 3
SRS: 0
SSS: 3
TID: 1

## 2017-08-24 ENCOUNTER — Encounter: Payer: Self-pay | Admitting: Internal Medicine

## 2017-08-26 ENCOUNTER — Other Ambulatory Visit: Payer: Self-pay

## 2017-08-30 ENCOUNTER — Telehealth: Payer: Self-pay

## 2017-08-30 DIAGNOSIS — R0602 Shortness of breath: Secondary | ICD-10-CM

## 2017-08-30 NOTE — Telephone Encounter (Signed)
[name], MD sent to Damian Leavell, RN        Educed DLCO is noted but not specific. No evidence of restriction.  Continue amiodarone and repeat PFTs in 6 months. If SOB worsens, will refer to pulmonary.  Please forward to PCP (Dr Nadara Mustard also).    Call placed to Pt.  Advised Pt of above.  At this time Pt would like to continue to monitor his sob.  Advised to call this nurse if sob gets worse and will refer him to pulmonology.  Pt indicates understanding.

## 2017-09-03 IMAGING — DX DG CHEST 2V
2 series · 2 of 2 positions shown · non-contrast
Comparison: Chest x-ray of 03/05/2013

CLINICAL DATA: Chest tightness, shortness of breath, cough

EXAM:
CHEST  2 VIEW

[chest pa]
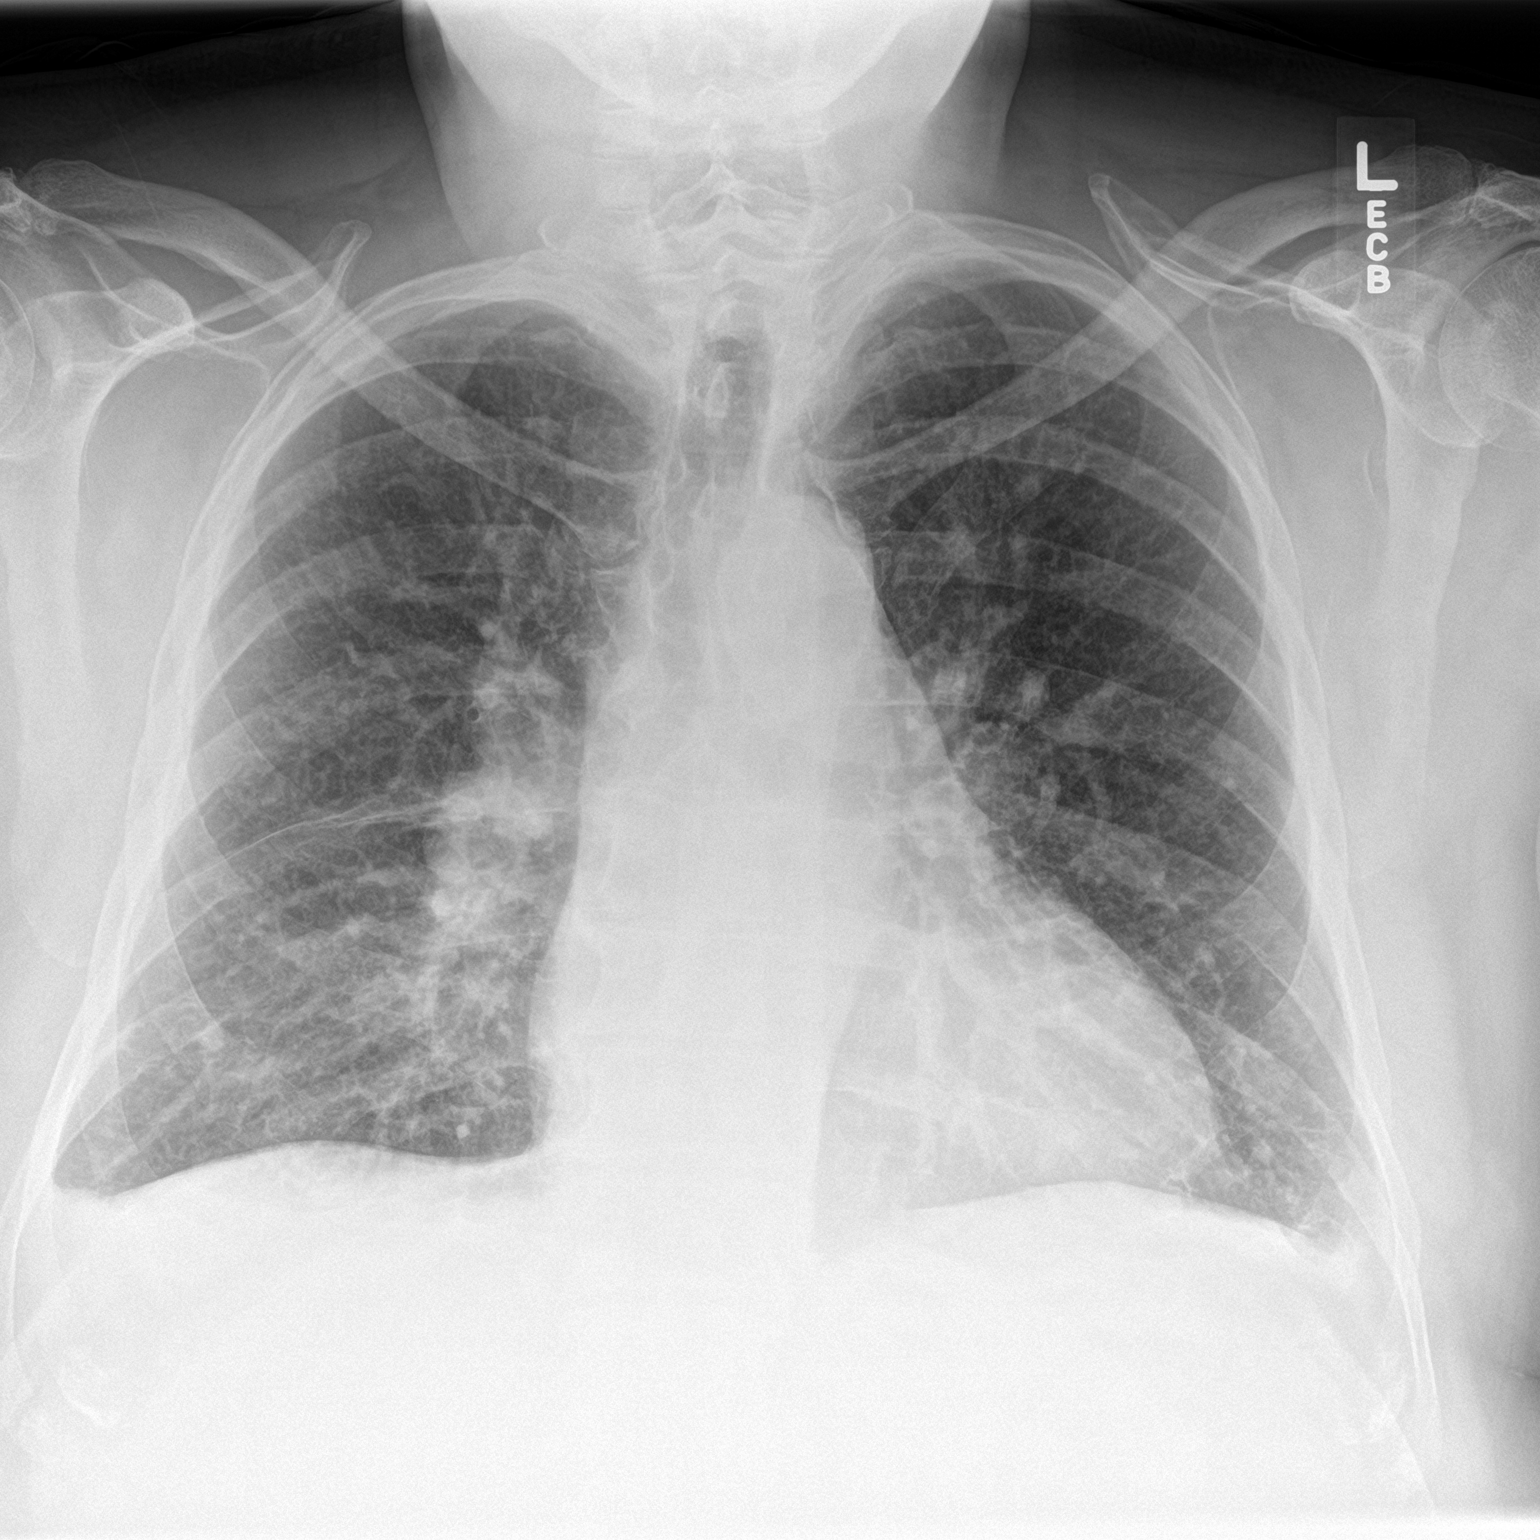

[chest lat]
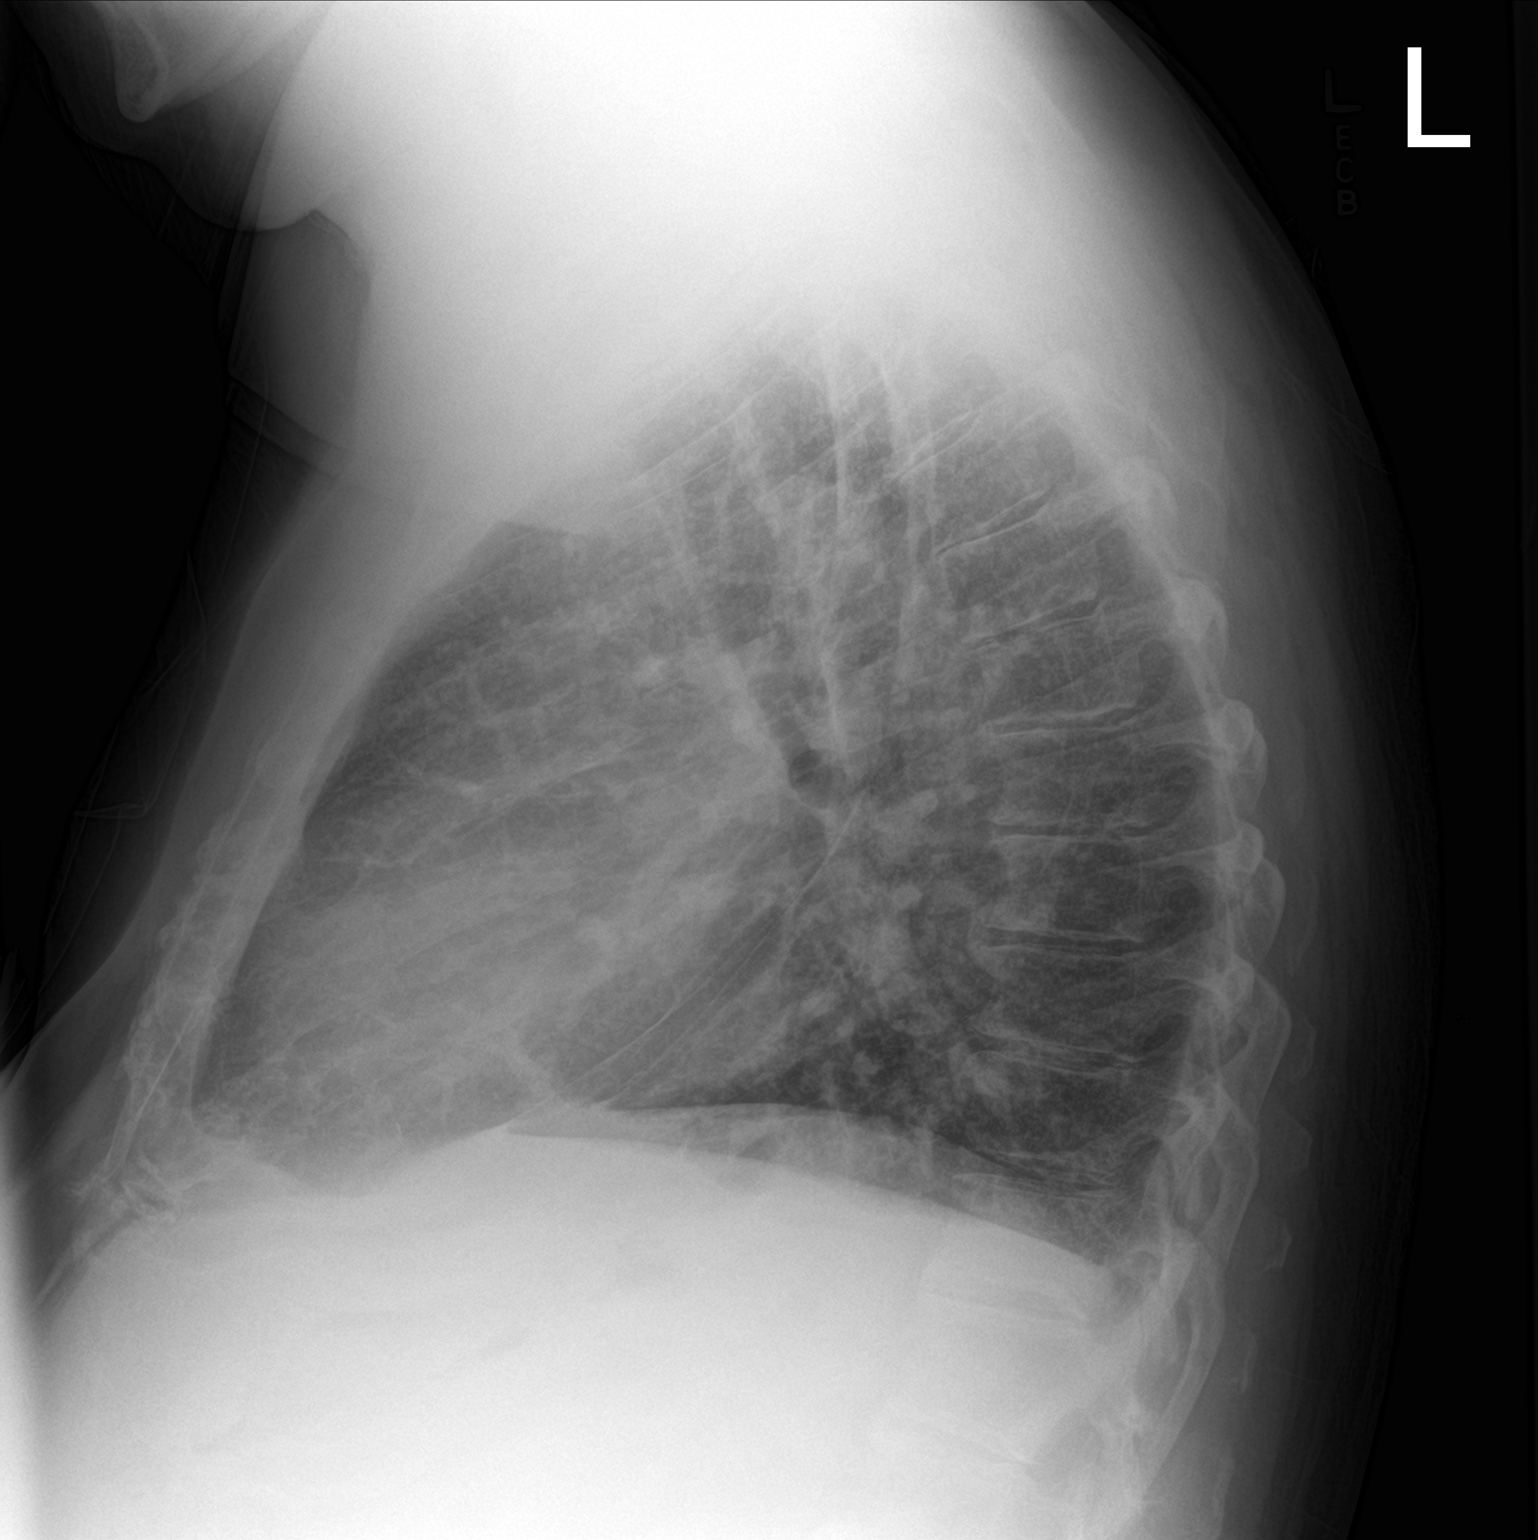

[2 of 2 positions shown; findings below may reference images not displayed]

FINDINGS: There is minimal blunting of the costophrenic angles consistent with
tiny pleural effusions. Also there is mild cardiomegaly with perhaps
minimal pulmonary vascular congestion. No definite pneumonia is
seen. No bony abnormality is noted.
IMPRESSION: 1. Suspect small pleural effusions blunting the costophrenic angles.
2. Mild cardiomegaly with questionable minimal pulmonary vascular
congestion.

## 2017-09-18 ENCOUNTER — Other Ambulatory Visit: Payer: Self-pay | Admitting: Internal Medicine

## 2017-09-18 ENCOUNTER — Other Ambulatory Visit: Payer: Self-pay | Admitting: Endocrinology

## 2017-10-12 DIAGNOSIS — H2513 Age-related nuclear cataract, bilateral: Secondary | ICD-10-CM | POA: Diagnosis not present

## 2017-10-12 DIAGNOSIS — H40013 Open angle with borderline findings, low risk, bilateral: Secondary | ICD-10-CM | POA: Diagnosis not present

## 2017-10-12 DIAGNOSIS — H43813 Vitreous degeneration, bilateral: Secondary | ICD-10-CM | POA: Diagnosis not present

## 2017-10-12 DIAGNOSIS — H43393 Other vitreous opacities, bilateral: Secondary | ICD-10-CM | POA: Diagnosis not present

## 2017-10-21 DIAGNOSIS — H25811 Combined forms of age-related cataract, right eye: Secondary | ICD-10-CM | POA: Diagnosis not present

## 2017-10-21 DIAGNOSIS — H401222 Low-tension glaucoma, left eye, moderate stage: Secondary | ICD-10-CM | POA: Diagnosis not present

## 2017-10-21 DIAGNOSIS — H25812 Combined forms of age-related cataract, left eye: Secondary | ICD-10-CM | POA: Diagnosis not present

## 2017-10-21 DIAGNOSIS — H401213 Low-tension glaucoma, right eye, severe stage: Secondary | ICD-10-CM | POA: Insufficient documentation
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
## 2017-10-31 ENCOUNTER — Ambulatory Visit (INDEPENDENT_AMBULATORY_CARE_PROVIDER_SITE_OTHER): Payer: Medicare Other | Admitting: Endocrinology

## 2017-10-31 ENCOUNTER — Encounter: Payer: Self-pay | Admitting: Endocrinology

## 2017-10-31 VITALS — BP 134/92 | HR 53 | Temp 97.7°F | Ht 71.0 in | Wt 306.6 lb

## 2017-10-31 DIAGNOSIS — E1165 Type 2 diabetes mellitus with hyperglycemia: Secondary | ICD-10-CM

## 2017-10-31 DIAGNOSIS — R0989 Other specified symptoms and signs involving the circulatory and respiratory systems: Secondary | ICD-10-CM

## 2017-10-31 DIAGNOSIS — Z794 Long term (current) use of insulin: Secondary | ICD-10-CM

## 2017-10-31 LAB — COMPREHENSIVE METABOLIC PANEL
ALT: 16 U/L (ref 0–53)
AST: 21 U/L (ref 0–37)
Albumin: 4.2 g/dL (ref 3.5–5.2)
Alkaline Phosphatase: 44 U/L (ref 39–117)
BUN: 16 mg/dL (ref 6–23)
CO2: 28 mEq/L (ref 19–32)
Calcium: 9.6 mg/dL (ref 8.4–10.5)
Chloride: 104 mEq/L (ref 96–112)
Creatinine, Ser: 0.91 mg/dL (ref 0.40–1.50)
GFR: 88.36 mL/min (ref >60.00)
Glucose, Bld: 87 mg/dL (ref 70–99)
Potassium: 4.1 mEq/L (ref 3.5–5.1)
Sodium: 138 mEq/L (ref 135–145)
Total Bilirubin: 0.4 mg/dL (ref 0.2–1.2)
Total Protein: 7.7 g/dL (ref 6.0–8.3)

## 2017-10-31 LAB — POCT GLYCOSYLATED HEMOGLOBIN (HGB A1C): Hemoglobin A1C: 5.3 % (ref 4.0–5.6)

## 2017-10-31 LAB — LIPID PANEL
Cholesterol: 106 mg/dL (ref 0–200)
HDL: 35.1 mg/dL — ABNORMAL LOW (ref >39.00)
LDL Cholesterol: 47 mg/dL (ref 0–99)
NonHDL: 70.6
Total CHOL/HDL Ratio: 3
Triglycerides: 118 mg/dL (ref 0.0–149.0)
VLDL: 23.6 mg/dL (ref 0.0–40.0)

## 2017-10-31 LAB — MICROALBUMIN / CREATININE URINE RATIO
Creatinine,U: 53.5 mg/dL
Microalb Creat Ratio: 1.8 mg/g (ref 0.0–30.0)
Microalb, Ur: 1 mg/dL (ref 0.0–1.9)

## 2017-10-31 NOTE — Patient Instructions (Signed)
Reduce 4 units on all doses except supper

## 2017-11-18 DIAGNOSIS — H401222 Low-tension glaucoma, left eye, moderate stage: Secondary | ICD-10-CM | POA: Diagnosis not present

## 2017-11-18 DIAGNOSIS — H25812 Combined forms of age-related cataract, left eye: Secondary | ICD-10-CM | POA: Diagnosis not present

## 2017-11-18 DIAGNOSIS — H25811 Combined forms of age-related cataract, right eye: Secondary | ICD-10-CM | POA: Diagnosis not present

## 2017-11-18 DIAGNOSIS — H401213 Low-tension glaucoma, right eye, severe stage: Secondary | ICD-10-CM | POA: Diagnosis not present
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
## 2017-11-22 ENCOUNTER — Encounter: Payer: Self-pay | Admitting: Cardiology

## 2017-11-22 ENCOUNTER — Ambulatory Visit (INDEPENDENT_AMBULATORY_CARE_PROVIDER_SITE_OTHER): Payer: Medicare Other | Admitting: Cardiology

## 2017-11-22 VITALS — BP 138/88 | HR 51 | Ht 71.0 in | Wt 307.8 lb

## 2017-11-22 DIAGNOSIS — I10 Essential (primary) hypertension: Secondary | ICD-10-CM

## 2017-11-22 DIAGNOSIS — G4733 Obstructive sleep apnea (adult) (pediatric): Secondary | ICD-10-CM | POA: Diagnosis not present

## 2017-11-22 HISTORY — DX: Morbid (severe) obesity due to excess calories: E66.01

## 2017-11-22 NOTE — Patient Instructions (Signed)
Medication Instructions:  Your physician recommends that you continue on your current medications as directed. Please refer to the Current Medication list given to you today.  If you need a refill on your cardiac medications before your next appointment, please call your pharmacy.   Lab work:  If you have labs (blood work) drawn today and your tests are completely normal, you will receive your results only by: Marland Kitchen MyChart Message (if you have MyChart) OR . A paper copy in the mail If you have any lab test that is abnormal or we need to change your treatment, we will call you to review the results.  Follow-Up: At [facility], you and your health needs are our priority.  As part of our continuing mission to provide you with exceptional heart care, we have created designated Provider Care Teams.  These Care Teams include your primary Cardiologist (physician) and Advanced Practice Providers (APPs -  Physician Assistants and Nurse Practitioners) who all work together to provide you with the care you need, when you need it. You will need a follow up appointment in 1 years.  Please call our office 2 months in advance to schedule this appointment.  You may see Dr. Radford Pax

## 2017-11-23 ENCOUNTER — Telehealth: Payer: Self-pay | Admitting: *Deleted

## 2017-11-23 DIAGNOSIS — G4733 Obstructive sleep apnea (adult) (pediatric): Secondary | ICD-10-CM

## 2017-11-23 NOTE — Telephone Encounter (Signed)
-----   Message from Sarina Ill, RN sent at 11/22/2017 12:52 PM EDT ----- Regarding: Sleep supplies CPAP supplies and Resmed Airfit F30 Mask. Orders in the computer, please send. Thanks, [employer]

## 2017-11-23 NOTE — Telephone Encounter (Signed)
Order sent via community message and faxed

## 2017-11-25 ENCOUNTER — Telehealth: Payer: Self-pay | Admitting: Internal Medicine

## 2017-11-25 ENCOUNTER — Ambulatory Visit (INDEPENDENT_AMBULATORY_CARE_PROVIDER_SITE_OTHER): Payer: Medicare Other | Admitting: Internal Medicine

## 2017-11-25 ENCOUNTER — Encounter: Payer: Self-pay | Admitting: Internal Medicine

## 2017-11-25 VITALS — BP 146/83 | HR 48 | Ht 71.0 in | Wt 306.0 lb

## 2017-11-25 DIAGNOSIS — I4819 Other persistent atrial fibrillation: Secondary | ICD-10-CM

## 2017-11-25 DIAGNOSIS — R0602 Shortness of breath: Secondary | ICD-10-CM | POA: Diagnosis not present

## 2017-11-25 DIAGNOSIS — I739 Peripheral vascular disease, unspecified: Secondary | ICD-10-CM

## 2017-11-25 MED ORDER — METOPROLOL TARTRATE 25 MG PO TABS: 12.5000 mg | ORAL_TABLET | Freq: Two times a day (BID) | ORAL | 3 refills | Status: DC

## 2017-11-25 NOTE — Patient Instructions (Addendum)
Medication Instructions:   Decrease Lopressor to 12.5mg  twice a day.  Continue all other medications.    Labwork: none  Testing/Procedures:  Your physician has recommended that you have a pulmonary function test. Pulmonary Function Tests are a group of tests that measure how well air moves in and out of your lungs - Due in January.  Your physician has requested that you have an ankle brachial index (ABI). During this test an ultrasound and blood pressure cuff are used to evaluate the arteries that supply the arms and legs with blood. Allow thirty minutes for this exam. There are no restrictions or special instructions.  Lower extremity arterial doppler   Office will contact with results via phone or letter.    Follow-Up: 6 months - Dr. [name]   Any Other Special Instructions Will Be Listed Below (If Applicable).  If you need a refill on your cardiac medications before your next appointment, please call your pharmacy.
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
## 2017-11-25 NOTE — Telephone Encounter (Signed)
Pre-cert Verification for the following procedure    ABI W/WO TBI  & VAS [place] LOWER EXT ART SEG MULTI (SEGMENTALS & LE RAYNAUDS Scheduled for 12/15/2017

## 2017-12-15 ENCOUNTER — Ambulatory Visit: Payer: Medicare Other

## 2017-12-15 ENCOUNTER — Ambulatory Visit (INDEPENDENT_AMBULATORY_CARE_PROVIDER_SITE_OTHER): Payer: Medicare Other

## 2017-12-15 DIAGNOSIS — I482 Chronic atrial fibrillation, unspecified: Secondary | ICD-10-CM | POA: Diagnosis not present

## 2017-12-15 DIAGNOSIS — Z6841 Body Mass Index (BMI) 40.0 and over, adult: Secondary | ICD-10-CM | POA: Diagnosis not present

## 2017-12-15 DIAGNOSIS — I739 Peripheral vascular disease, unspecified: Secondary | ICD-10-CM

## 2017-12-15 DIAGNOSIS — Z23 Encounter for immunization: Secondary | ICD-10-CM | POA: Diagnosis not present

## 2018-01-10 ENCOUNTER — Other Ambulatory Visit: Payer: Self-pay

## 2018-01-10 MED ORDER — GLUCOSE BLOOD VI STRP: ORAL_STRIP | 3 refills | Status: DC

## 2018-01-13 ENCOUNTER — Other Ambulatory Visit: Payer: Self-pay

## 2018-01-13 MED ORDER — GLUCOSE BLOOD VI STRP: ORAL_STRIP | 3 refills | Status: DC

## 2018-01-17 ENCOUNTER — Other Ambulatory Visit: Payer: Self-pay

## 2018-01-17 MED ORDER — GLUCOSE BLOOD VI STRP: ORAL_STRIP | 3 refills | Status: DC

## 2018-01-20 ENCOUNTER — Other Ambulatory Visit: Payer: Self-pay | Admitting: Internal Medicine

## 2018-01-20 ENCOUNTER — Other Ambulatory Visit: Payer: Self-pay | Admitting: Endocrinology

## 2018-01-20 NOTE — Telephone Encounter (Signed)
Eliquis 5mg  refill request received; pt is 67 yrs old, wt-138.8kg, Crea-0.91 on 10/31/17, last seen by Dr. [name] on 11/25/17; will send in refill to requested pharmacy.

## 2018-01-31 ENCOUNTER — Encounter: Payer: Self-pay | Admitting: Endocrinology

## 2018-01-31 ENCOUNTER — Other Ambulatory Visit: Payer: Self-pay | Admitting: *Deleted

## 2018-01-31 ENCOUNTER — Ambulatory Visit (INDEPENDENT_AMBULATORY_CARE_PROVIDER_SITE_OTHER): Payer: Medicare Other | Admitting: Endocrinology

## 2018-01-31 VITALS — BP 136/78 | HR 78 | Ht 71.0 in | Wt 310.6 lb

## 2018-01-31 DIAGNOSIS — E1165 Type 2 diabetes mellitus with hyperglycemia: Secondary | ICD-10-CM | POA: Diagnosis not present

## 2018-01-31 DIAGNOSIS — E1169 Type 2 diabetes mellitus with other specified complication: Secondary | ICD-10-CM | POA: Diagnosis not present

## 2018-01-31 DIAGNOSIS — D6489 Other specified anemias: Secondary | ICD-10-CM | POA: Diagnosis not present

## 2018-01-31 DIAGNOSIS — Z794 Long term (current) use of insulin: Secondary | ICD-10-CM | POA: Diagnosis not present

## 2018-01-31 DIAGNOSIS — E669 Obesity, unspecified: Secondary | ICD-10-CM | POA: Diagnosis not present

## 2018-01-31 DIAGNOSIS — R635 Abnormal weight gain: Secondary | ICD-10-CM | POA: Diagnosis not present

## 2018-01-31 DIAGNOSIS — Z862 Personal history of diseases of the blood and blood-forming organs and certain disorders involving the immune mechanism: Secondary | ICD-10-CM | POA: Diagnosis not present

## 2018-01-31 LAB — CBC WITH DIFFERENTIAL/PLATELET
Basophils Absolute: 0 10*3/uL (ref 0.0–0.1)
Basophils Relative: 0.9 % (ref 0.0–3.0)
Eosinophils Absolute: 0.1 10*3/uL (ref 0.0–0.7)
Eosinophils Relative: 2.6 % (ref 0.0–5.0)
HCT: 39.4 % (ref 39.0–52.0)
Hemoglobin: 12.8 g/dL — ABNORMAL LOW (ref 13.0–17.0)
Lymphocytes Relative: 34.3 % (ref 12.0–46.0)
Lymphs Abs: 1.6 10*3/uL (ref 0.7–4.0)
MCHC: 32.3 g/dL (ref 30.0–36.0)
MCV: 83.5 fl (ref 78.0–100.0)
Monocytes Absolute: 0.4 10*3/uL (ref 0.1–1.0)
Monocytes Relative: 9.2 % (ref 3.0–12.0)
Neutro Abs: 2.5 10*3/uL (ref 1.4–7.7)
Neutrophils Relative %: 53 % (ref 43.0–77.0)
Platelets: 173 10*3/uL (ref 150.0–400.0)
RBC: 4.72 Mil/uL (ref 4.22–5.81)
RDW: 17.9 % — ABNORMAL HIGH (ref 11.5–15.5)
WBC: 4.8 10*3/uL (ref 4.0–10.5)

## 2018-01-31 LAB — COMPREHENSIVE METABOLIC PANEL
ALT: 15 U/L (ref 0–53)
AST: 15 U/L (ref 0–37)
Albumin: 4.3 g/dL (ref 3.5–5.2)
Alkaline Phosphatase: 40 U/L (ref 39–117)
BUN: 15 mg/dL (ref 6–23)
CO2: 28 mEq/L (ref 19–32)
Calcium: 9.8 mg/dL (ref 8.4–10.5)
Chloride: 102 mEq/L (ref 96–112)
Creatinine, Ser: 0.85 mg/dL (ref 0.40–1.50)
GFR: 95.52 mL/min (ref >60.00)
Glucose, Bld: 92 mg/dL (ref 70–99)
Potassium: 4.1 mEq/L (ref 3.5–5.1)
Sodium: 138 mEq/L (ref 135–145)
Total Bilirubin: 0.6 mg/dL (ref 0.2–1.2)
Total Protein: 7.2 g/dL (ref 6.0–8.3)

## 2018-01-31 LAB — POCT GLYCOSYLATED HEMOGLOBIN (HGB A1C): Hemoglobin A1C: 5.6 % (ref 4.0–5.6)

## 2018-01-31 LAB — TSH: TSH: 1.86 u[IU]/mL (ref 0.35–4.50)

## 2018-01-31 LAB — VITAMIN B12: Vitamin B-12: 236 pg/mL (ref 211–911)

## 2018-01-31 MED ORDER — FUROSEMIDE 40 MG PO TABS: 20.0000 mg | ORAL_TABLET | Freq: Every day | ORAL | 1 refills | Status: DC
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
## 2018-02-01 ENCOUNTER — Ambulatory Visit (HOSPITAL_COMMUNITY)
Admission: RE | Admit: 2018-02-01 | Discharge: 2018-02-01 | Disposition: A | Discharge status: Home / Self Care | Payer: Medicare Other | Source: Ambulatory Visit | Attending: Internal Medicine | Admitting: Internal Medicine

## 2018-02-01 DIAGNOSIS — R0602 Shortness of breath: Secondary | ICD-10-CM | POA: Insufficient documentation

## 2018-02-01 LAB — PULMONARY FUNCTION TEST
DL/VA % pred: 101 %
DL/VA: 4.71 ml/min/mmHg/L
DLCO cor % pred: 82 %
DLCO cor: 27.94 ml/min/mmHg
DLCO unc % pred: 78 %
DLCO unc: 26.41 ml/min/mmHg
FEF 25-75 Pre: 2.44 L/sec
FEF2575-%Pred-Pre: 89 %
FEV1-%Pred-Pre: 82 %
FEV1-Pre: 2.88 L
FEV1FVC-%Pred-Pre: 103 %
FEV6-%Pred-Pre: 83 %
FEV6-Pre: 3.71 L
FEV6FVC-%Pred-Pre: 104 %
FVC-%Pred-Pre: 80 %
FVC-Pre: 3.76 L
Pre FEV1/FVC ratio: 76 %
Pre FEV6/FVC Ratio: 99 %
RV % pred: 105 %
RV: 2.59 L
TLC % pred: 91 %
TLC: 6.59 L

## 2018-02-01 MED ORDER — ALBUTEROL SULFATE (2.5 MG/3ML) 0.083% IN NEBU: 2.5000 mg | INHALATION_SOLUTION | Freq: Once | RESPIRATORY_TRACT | Status: DC

## 2018-02-07 NOTE — Progress Notes (Signed)
Please call to let patient know that the lab results are normal with only borderline anemia and normal thyroid

## 2018-02-16 DIAGNOSIS — I10 Essential (primary) hypertension: Secondary | ICD-10-CM | POA: Diagnosis not present

## 2018-02-16 DIAGNOSIS — Z6841 Body Mass Index (BMI) 40.0 and over, adult: Secondary | ICD-10-CM | POA: Diagnosis not present

## 2018-02-16 DIAGNOSIS — E1165 Type 2 diabetes mellitus with hyperglycemia: Secondary | ICD-10-CM | POA: Diagnosis not present

## 2018-02-16 DIAGNOSIS — F5221 Male erectile disorder: Secondary | ICD-10-CM | POA: Diagnosis not present

## 2018-02-24 ENCOUNTER — Other Ambulatory Visit: Payer: Self-pay | Admitting: *Deleted

## 2018-02-24 MED ORDER — METOPROLOL TARTRATE 25 MG PO TABS: 12.5000 mg | ORAL_TABLET | Freq: Two times a day (BID) | ORAL | 2 refills | Status: DC

## 2018-03-01 DIAGNOSIS — H25811 Combined forms of age-related cataract, right eye: Secondary | ICD-10-CM | POA: Diagnosis not present

## 2018-03-01 DIAGNOSIS — H25812 Combined forms of age-related cataract, left eye: Secondary | ICD-10-CM | POA: Diagnosis not present

## 2018-03-01 DIAGNOSIS — H401213 Low-tension glaucoma, right eye, severe stage: Secondary | ICD-10-CM | POA: Diagnosis not present

## 2018-03-01 DIAGNOSIS — H401222 Low-tension glaucoma, left eye, moderate stage: Secondary | ICD-10-CM | POA: Diagnosis not present

## 2018-03-14 ENCOUNTER — Other Ambulatory Visit: Payer: Self-pay | Admitting: *Deleted

## 2018-03-14 ENCOUNTER — Other Ambulatory Visit: Payer: Self-pay

## 2018-03-14 MED ORDER — AMIODARONE HCL 200 MG PO TABS: 200.0000 mg | ORAL_TABLET | Freq: Every day | ORAL | 1 refills | Status: DC

## 2018-03-14 MED ORDER — FUROSEMIDE 40 MG PO TABS: 20.0000 mg | ORAL_TABLET | Freq: Every day | ORAL | 1 refills | Status: DC

## 2018-03-14 MED ORDER — EMPAGLIFLOZIN 25 MG PO TABS: 25.0000 mg | ORAL_TABLET | Freq: Every day | ORAL | 1 refills | Status: DC

## 2018-03-14 MED ORDER — APIXABAN 5 MG PO TABS: 5.0000 mg | ORAL_TABLET | Freq: Two times a day (BID) | ORAL | 1 refills | Status: DC

## 2018-03-14 MED ORDER — SIMVASTATIN 20 MG PO TABS: ORAL_TABLET | ORAL | 1 refills | Status: DC

## 2018-03-15 ENCOUNTER — Other Ambulatory Visit: Payer: Self-pay | Admitting: *Deleted

## 2018-03-15 MED ORDER — FUROSEMIDE 40 MG PO TABS: 40.0000 mg | ORAL_TABLET | Freq: Every day | ORAL | 1 refills | Status: DC

## 2018-03-20 ENCOUNTER — Other Ambulatory Visit: Payer: Self-pay

## 2018-03-20 MED ORDER — SEMAGLUTIDE(0.25 OR 0.5MG/DOS) 2 MG/1.5ML ~~LOC~~ SOPN: 0.5000 mg | PEN_INJECTOR | SUBCUTANEOUS | 2 refills | Status: DC

## 2018-03-24 ENCOUNTER — Other Ambulatory Visit: Payer: Self-pay

## 2018-03-24 ENCOUNTER — Telehealth: Payer: Self-pay

## 2018-03-24 MED ORDER — EXENATIDE ER 2 MG/0.85ML ~~LOC~~ AUIJ: 2.0000 mg | AUTO-INJECTOR | SUBCUTANEOUS | 3 refills | Status: DC

## 2018-03-24 NOTE — Telephone Encounter (Signed)
According to pt's drug formulary, Trulicity and Victoza are both Tier 3 medications, indicating that they are not covered.

## 2018-03-24 NOTE — Telephone Encounter (Signed)
Rx sent 

## 2018-03-24 NOTE — Telephone Encounter (Signed)
He will have to go to [employer] 2 mg weekly, he may need to be instructed on how to use the pen

## 2018-04-10 ENCOUNTER — Other Ambulatory Visit: Payer: Self-pay | Admitting: Endocrinology

## 2018-04-12 ENCOUNTER — Other Ambulatory Visit: Payer: Self-pay

## 2018-04-12 MED ORDER — INSULIN ASPART 100 UNIT/ML FLEXPEN: PEN_INJECTOR | SUBCUTANEOUS | 3 refills | Status: DC

## 2018-04-12 MED ORDER — INSULIN DEGLUDEC 200 UNIT/ML ~~LOC~~ SOPN: 56.0000 [IU] | PEN_INJECTOR | Freq: Every day | SUBCUTANEOUS | 3 refills | Status: DC

## 2018-04-24 MED ORDER — POTASSIUM CHLORIDE CRYS ER 20 MEQ PO TBCR: 20.0000 meq | EXTENDED_RELEASE_TABLET | Freq: Two times a day (BID) | ORAL | 3 refills | Status: DC

## 2018-04-24 MED ORDER — PANTOPRAZOLE SODIUM 40 MG PO TBEC: 40.0000 mg | DELAYED_RELEASE_TABLET | Freq: Every day | ORAL | 3 refills | Status: DC

## 2018-05-24 ENCOUNTER — Telehealth: Payer: Self-pay

## 2018-05-25 NOTE — Telephone Encounter (Signed)
Spoke wit pt regarding appt on 05/26/18. Pt was advise to check vitals prior to appt. Pt concerns  were address.

## 2018-05-26 ENCOUNTER — Telehealth: Payer: Medicare Other | Admitting: Internal Medicine

## 2018-05-30 ENCOUNTER — Telehealth (INDEPENDENT_AMBULATORY_CARE_PROVIDER_SITE_OTHER): Payer: Medicare Other | Admitting: Internal Medicine

## 2018-05-30 VITALS — BP 125/80 | HR 55 | Wt 318.0 lb

## 2018-05-30 DIAGNOSIS — I10 Essential (primary) hypertension: Secondary | ICD-10-CM

## 2018-05-30 DIAGNOSIS — I4819 Other persistent atrial fibrillation: Secondary | ICD-10-CM

## 2018-05-30 DIAGNOSIS — G4733 Obstructive sleep apnea (adult) (pediatric): Secondary | ICD-10-CM
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
## 2018-05-31 DIAGNOSIS — H401222 Low-tension glaucoma, left eye, moderate stage: Secondary | ICD-10-CM | POA: Diagnosis not present

## 2018-05-31 DIAGNOSIS — H25812 Combined forms of age-related cataract, left eye: Secondary | ICD-10-CM | POA: Diagnosis not present

## 2018-05-31 DIAGNOSIS — H401213 Low-tension glaucoma, right eye, severe stage: Secondary | ICD-10-CM | POA: Diagnosis not present

## 2018-05-31 DIAGNOSIS — H25811 Combined forms of age-related cataract, right eye: Secondary | ICD-10-CM | POA: Diagnosis not present

## 2018-06-01 ENCOUNTER — Ambulatory Visit: Payer: Medicare Other | Admitting: Endocrinology

## 2018-06-15 DIAGNOSIS — F5221 Male erectile disorder: Secondary | ICD-10-CM | POA: Diagnosis not present

## 2018-06-15 DIAGNOSIS — E1165 Type 2 diabetes mellitus with hyperglycemia: Secondary | ICD-10-CM | POA: Diagnosis not present

## 2018-06-15 DIAGNOSIS — Z125 Encounter for screening for malignant neoplasm of prostate: Secondary | ICD-10-CM | POA: Diagnosis not present

## 2018-06-15 DIAGNOSIS — I10 Essential (primary) hypertension: Secondary | ICD-10-CM | POA: Diagnosis not present

## 2018-06-15 DIAGNOSIS — I482 Chronic atrial fibrillation, unspecified: Secondary | ICD-10-CM | POA: Diagnosis not present

## 2018-06-21 DIAGNOSIS — E1165 Type 2 diabetes mellitus with hyperglycemia: Secondary | ICD-10-CM | POA: Diagnosis not present

## 2018-06-21 DIAGNOSIS — I10 Essential (primary) hypertension: Secondary | ICD-10-CM | POA: Diagnosis not present

## 2018-06-21 DIAGNOSIS — F5221 Male erectile disorder: Secondary | ICD-10-CM | POA: Diagnosis not present

## 2018-06-21 DIAGNOSIS — I482 Chronic atrial fibrillation, unspecified: Secondary | ICD-10-CM | POA: Diagnosis not present

## 2018-06-21 DIAGNOSIS — Z6841 Body Mass Index (BMI) 40.0 and over, adult: Secondary | ICD-10-CM | POA: Diagnosis not present

## 2018-06-23 ENCOUNTER — Telehealth: Payer: Self-pay

## 2018-06-23 NOTE — Telephone Encounter (Signed)
Canceled 06/01/18 appt. Called to reschedule. LVM requesting returned call.

## 2018-08-08 ENCOUNTER — Other Ambulatory Visit: Payer: Self-pay

## 2018-08-08 MED ORDER — PEN NEEDLES 32G X 4 MM MISC: 1.0000 | Freq: Four times a day (QID) | 4 refills | Status: DC

## 2018-08-13 ENCOUNTER — Other Ambulatory Visit: Payer: Self-pay | Admitting: Endocrinology

## 2018-08-15 ENCOUNTER — Telehealth: Payer: Self-pay | Admitting: Endocrinology

## 2018-08-15 NOTE — Telephone Encounter (Signed)
Patient will contact us via MyChart with the dates he will be available

## 2018-08-15 NOTE — Telephone Encounter (Signed)
-----   Message from Elayne Snare, MD sent at 08/13/2018  9:41 PM EDT ----- Regarding: Needs to reschedule canceled appointment Needs to reschedule, telehealth visit okay with or without labs

## 2018-08-20 ENCOUNTER — Other Ambulatory Visit: Payer: Self-pay | Admitting: Endocrinology

## 2018-08-21 NOTE — Telephone Encounter (Signed)
LMTCB

## 2018-09-05 ENCOUNTER — Other Ambulatory Visit: Payer: Self-pay | Admitting: Internal Medicine

## 2018-09-05 DIAGNOSIS — H25812 Combined forms of age-related cataract, left eye: Secondary | ICD-10-CM | POA: Diagnosis not present

## 2018-09-05 DIAGNOSIS — H25811 Combined forms of age-related cataract, right eye: Secondary | ICD-10-CM | POA: Diagnosis not present

## 2018-09-05 DIAGNOSIS — H401213 Low-tension glaucoma, right eye, severe stage: Secondary | ICD-10-CM | POA: Diagnosis not present

## 2018-09-05 DIAGNOSIS — H401222 Low-tension glaucoma, left eye, moderate stage: Secondary | ICD-10-CM | POA: Diagnosis not present

## 2018-09-06 ENCOUNTER — Other Ambulatory Visit: Payer: Self-pay | Admitting: Endocrinology

## 2018-09-06 ENCOUNTER — Other Ambulatory Visit: Payer: Self-pay | Admitting: Internal Medicine

## 2018-09-08 ENCOUNTER — Other Ambulatory Visit: Payer: Self-pay | Admitting: *Deleted

## 2018-09-08 MED ORDER — APIXABAN 5 MG PO TABS: 5.0000 mg | ORAL_TABLET | Freq: Two times a day (BID) | ORAL | 1 refills | Status: DC

## 2018-09-11 IMAGING — NM NM MYOCAR MULTI W/SPECT W/WALL MOTION & EF
2 series · 12 of 12 positions shown · non-contrast
Comparison: none

[Series 1: rest · 6.51mm/px · 6 of 64 frames shown]
[frame 6/64]
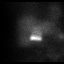
[frame 16/64]
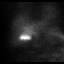
[frame 27/64]
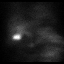
[frame 38/64]
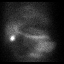
[frame 48/64]
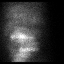
[frame 59/64]
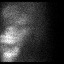

[Series 3: stress gated - perfusion · 6.51mm/px · 6 of 64 frames shown]
[frame 6/64]
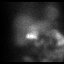
[frame 16/64]
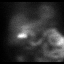
[frame 27/64]
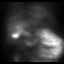
[frame 38/64]
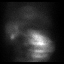
[frame 48/64]
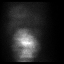
[frame 59/64]
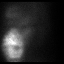

[12 of 12 positions shown; findings below may reference images not displayed]

Canned report from images found in remote index.

Refer to host system for actual result text.

## 2018-09-14 ENCOUNTER — Other Ambulatory Visit: Payer: Self-pay | Admitting: Endocrinology

## 2018-09-25 ENCOUNTER — Encounter: Payer: Self-pay | Admitting: *Deleted

## 2018-10-01 ENCOUNTER — Other Ambulatory Visit: Payer: Self-pay | Admitting: Endocrinology

## 2018-10-10 DIAGNOSIS — E119 Type 2 diabetes mellitus without complications: Secondary | ICD-10-CM | POA: Diagnosis not present

## 2018-10-10 LAB — HM DIABETES EYE EXAM

## 2018-10-17 ENCOUNTER — Other Ambulatory Visit: Payer: Self-pay

## 2018-10-17 ENCOUNTER — Ambulatory Visit: Payer: Medicare Other | Admitting: Endocrinology

## 2018-10-17 NOTE — Telephone Encounter (Signed)
FYI

## 2018-10-25 ENCOUNTER — Ambulatory Visit (INDEPENDENT_AMBULATORY_CARE_PROVIDER_SITE_OTHER): Payer: Medicare Other | Admitting: Orthopedic Surgery

## 2018-10-25 ENCOUNTER — Encounter: Payer: Self-pay | Admitting: Radiology

## 2018-10-25 ENCOUNTER — Ambulatory Visit: Payer: Medicare Other

## 2018-10-25 ENCOUNTER — Encounter: Payer: Self-pay | Admitting: Orthopedic Surgery

## 2018-10-25 ENCOUNTER — Other Ambulatory Visit: Payer: Self-pay

## 2018-10-25 VITALS — BP 138/66 | HR 52 | Ht 71.0 in | Wt 342.0 lb

## 2018-10-25 DIAGNOSIS — M545 Low back pain, unspecified: Secondary | ICD-10-CM

## 2018-10-25 NOTE — Patient Instructions (Addendum)
You have extensive arthritis in your lower back with degenerative disc disease and spondylosis fancy names for arthritis  Recommend physical therapy local measures such as heat arthritis and muscle creams exercise  These are the muscle and arthrits creams I recommend:  PLEASE READ THE PACKAGE INSTRUCTIONS BEFORE USING   Ben Gay arthritis cream  Icy hot vanishing gel  Aspercreme odor free  Myoflex Oderless pain reliever  Capzasin  Sportscreme  Max freeze
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
## 2018-11-01 DIAGNOSIS — Z23 Encounter for immunization: Secondary | ICD-10-CM | POA: Diagnosis not present

## 2018-11-05 ENCOUNTER — Other Ambulatory Visit: Payer: Self-pay | Admitting: Endocrinology

## 2018-11-06 ENCOUNTER — Other Ambulatory Visit: Payer: Self-pay

## 2018-11-06 ENCOUNTER — Encounter: Payer: Self-pay | Admitting: Endocrinology

## 2018-11-06 ENCOUNTER — Ambulatory Visit (INDEPENDENT_AMBULATORY_CARE_PROVIDER_SITE_OTHER): Payer: Medicare Other | Admitting: Endocrinology

## 2018-11-06 VITALS — BP 152/80 | HR 95 | Ht 71.0 in | Wt 344.6 lb

## 2018-11-06 DIAGNOSIS — E1165 Type 2 diabetes mellitus with hyperglycemia: Secondary | ICD-10-CM

## 2018-11-06 DIAGNOSIS — E782 Mixed hyperlipidemia: Secondary | ICD-10-CM | POA: Diagnosis not present

## 2018-11-06 DIAGNOSIS — Z794 Long term (current) use of insulin: Secondary | ICD-10-CM

## 2018-11-06 DIAGNOSIS — D6489 Other specified anemias: Secondary | ICD-10-CM

## 2018-11-06 DIAGNOSIS — Z79899 Other long term (current) drug therapy: Secondary | ICD-10-CM

## 2018-11-06 LAB — POCT GLYCOSYLATED HEMOGLOBIN (HGB A1C): Hemoglobin A1C: 6.9 % — AB (ref 4.0–5.6)

## 2018-11-06 NOTE — Patient Instructions (Addendum)
Ozempic 1mg  weekly  Tresiba 60 and keep am sugar< 130  Low Fat and Carb diet  More fiber in diet  Check blood sugars on waking up 5 days a week  Also check blood sugars about 2 hours after meals and do this after different meals by rotation  Recommended blood sugar levels on waking up are 90-130 and about 2 hours after meal is 130-160  Please bring your blood sugar monitor to each visit, thank you
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
## 2018-11-07 LAB — URINALYSIS, ROUTINE W REFLEX MICROSCOPIC
Bilirubin Urine: NEGATIVE
Hgb urine dipstick: NEGATIVE
Ketones, ur: NEGATIVE
Leukocytes,Ua: NEGATIVE
Nitrite: NEGATIVE
RBC / HPF: NONE SEEN (ref 0–?)
Specific Gravity, Urine: 1.02 (ref 1.000–1.030)
Urine Glucose: 250 — AB
Urobilinogen, UA: 0.2 (ref 0.0–1.0)
pH: 5 (ref 5.0–8.0)

## 2018-11-07 LAB — VITAMIN B12: Vitamin B-12: 277 pg/mL (ref 211–911)

## 2018-11-07 LAB — CBC WITH DIFFERENTIAL/PLATELET
Basophils Absolute: 0.1 10*3/uL (ref 0.0–0.1)
Basophils Relative: 1.3 % (ref 0.0–3.0)
Eosinophils Absolute: 0.2 10*3/uL (ref 0.0–0.7)
Eosinophils Relative: 2.8 % (ref 0.0–5.0)
HCT: 40.3 % (ref 39.0–52.0)
Hemoglobin: 12.7 g/dL — ABNORMAL LOW (ref 13.0–17.0)
Lymphocytes Relative: 33.8 % (ref 12.0–46.0)
Lymphs Abs: 1.9 10*3/uL (ref 0.7–4.0)
MCHC: 31.6 g/dL (ref 30.0–36.0)
MCV: 83.9 fl (ref 78.0–100.0)
Monocytes Absolute: 0.6 10*3/uL (ref 0.1–1.0)
Monocytes Relative: 10.8 % (ref 3.0–12.0)
Neutro Abs: 2.8 10*3/uL (ref 1.4–7.7)
Neutrophils Relative %: 51.3 % (ref 43.0–77.0)
Platelets: 195 10*3/uL (ref 150.0–400.0)
RBC: 4.8 Mil/uL (ref 4.22–5.81)
RDW: 16.9 % — ABNORMAL HIGH (ref 11.5–15.5)
WBC: 5.5 10*3/uL (ref 4.0–10.5)

## 2018-11-07 LAB — COMPREHENSIVE METABOLIC PANEL
ALT: 28 U/L (ref 0–53)
AST: 24 U/L (ref 0–37)
Albumin: 4.6 g/dL (ref 3.5–5.2)
Alkaline Phosphatase: 48 U/L (ref 39–117)
BUN: 17 mg/dL (ref 6–23)
CO2: 26 mEq/L (ref 19–32)
Calcium: 9.7 mg/dL (ref 8.4–10.5)
Chloride: 103 mEq/L (ref 96–112)
Creatinine, Ser: 0.82 mg/dL (ref 0.40–1.50)
GFR: 93.46 mL/min (ref >60.00)
Glucose, Bld: 112 mg/dL — ABNORMAL HIGH (ref 70–99)
Potassium: 4.3 mEq/L (ref 3.5–5.1)
Sodium: 137 mEq/L (ref 135–145)
Total Bilirubin: 0.4 mg/dL (ref 0.2–1.2)
Total Protein: 7.6 g/dL (ref 6.0–8.3)

## 2018-11-07 LAB — MICROALBUMIN / CREATININE URINE RATIO
Creatinine,U: 42.7 mg/dL
Microalb Creat Ratio: 37.6 mg/g — ABNORMAL HIGH (ref 0.0–30.0)
Microalb, Ur: 16.1 mg/dL — ABNORMAL HIGH (ref 0.0–1.9)

## 2018-11-07 LAB — TSH: TSH: 2.4 u[IU]/mL (ref 0.35–4.50)

## 2018-11-07 LAB — LIPID PANEL
Cholesterol: 176 mg/dL (ref 0–200)
HDL: 36.7 mg/dL — ABNORMAL LOW (ref >39.00)
NonHDL: 139.77
Total CHOL/HDL Ratio: 5
Triglycerides: 249 mg/dL — ABNORMAL HIGH (ref 0.0–149.0)
VLDL: 49.8 mg/dL — ABNORMAL HIGH (ref 0.0–40.0)

## 2018-11-07 LAB — LDL CHOLESTEROL, DIRECT: Direct LDL: 116 mg/dL

## 2018-11-17 ENCOUNTER — Other Ambulatory Visit: Payer: Self-pay

## 2018-11-17 MED ORDER — OZEMPIC (1 MG/DOSE) 2 MG/1.5ML ~~LOC~~ SOPN: 1.0000 mg | PEN_INJECTOR | SUBCUTANEOUS | 2 refills | Status: DC

## 2018-11-20 ENCOUNTER — Other Ambulatory Visit: Payer: Self-pay

## 2018-11-24 ENCOUNTER — Other Ambulatory Visit: Payer: Self-pay | Admitting: Internal Medicine

## 2018-11-25 ENCOUNTER — Other Ambulatory Visit: Payer: Self-pay | Admitting: Endocrinology

## 2018-11-28 DIAGNOSIS — Z888 Allergy status to other drugs, medicaments and biological substances status: Secondary | ICD-10-CM | POA: Diagnosis not present

## 2018-11-28 DIAGNOSIS — Z794 Long term (current) use of insulin: Secondary | ICD-10-CM | POA: Diagnosis not present

## 2018-11-28 DIAGNOSIS — R002 Palpitations: Secondary | ICD-10-CM | POA: Diagnosis not present

## 2018-11-28 DIAGNOSIS — I2510 Atherosclerotic heart disease of native coronary artery without angina pectoris: Secondary | ICD-10-CM | POA: Diagnosis not present

## 2018-11-28 DIAGNOSIS — Z03818 Encounter for observation for suspected exposure to other biological agents ruled out: Secondary | ICD-10-CM | POA: Diagnosis not present

## 2018-11-28 DIAGNOSIS — Z20828 Contact with and (suspected) exposure to other viral communicable diseases: Secondary | ICD-10-CM | POA: Diagnosis not present

## 2018-11-28 DIAGNOSIS — Z791 Long term (current) use of non-steroidal anti-inflammatories (NSAID): Secondary | ICD-10-CM | POA: Diagnosis not present

## 2018-11-28 DIAGNOSIS — R0602 Shortness of breath: Secondary | ICD-10-CM | POA: Diagnosis not present

## 2018-11-28 DIAGNOSIS — A419 Sepsis, unspecified organism: Secondary | ICD-10-CM | POA: Diagnosis not present

## 2018-11-28 DIAGNOSIS — R0902 Hypoxemia: Secondary | ICD-10-CM | POA: Diagnosis present

## 2018-11-28 DIAGNOSIS — R509 Fever, unspecified: Secondary | ICD-10-CM | POA: Diagnosis not present

## 2018-11-28 DIAGNOSIS — R531 Weakness: Secondary | ICD-10-CM | POA: Diagnosis not present

## 2018-11-28 DIAGNOSIS — Z8249 Family history of ischemic heart disease and other diseases of the circulatory system: Secondary | ICD-10-CM | POA: Diagnosis not present

## 2018-11-28 DIAGNOSIS — J159 Unspecified bacterial pneumonia: Secondary | ICD-10-CM | POA: Diagnosis not present

## 2018-11-28 DIAGNOSIS — R7989 Other specified abnormal findings of blood chemistry: Secondary | ICD-10-CM | POA: Diagnosis not present

## 2018-11-28 DIAGNOSIS — N390 Urinary tract infection, site not specified: Secondary | ICD-10-CM | POA: Diagnosis not present

## 2018-11-28 DIAGNOSIS — R0600 Dyspnea, unspecified: Secondary | ICD-10-CM | POA: Diagnosis not present

## 2018-11-28 DIAGNOSIS — I509 Heart failure, unspecified: Secondary | ICD-10-CM | POA: Diagnosis not present

## 2018-11-28 DIAGNOSIS — I480 Paroxysmal atrial fibrillation: Secondary | ICD-10-CM | POA: Diagnosis not present

## 2018-11-28 DIAGNOSIS — Z882 Allergy status to sulfonamides status: Secondary | ICD-10-CM | POA: Diagnosis not present

## 2018-11-28 DIAGNOSIS — E785 Hyperlipidemia, unspecified: Secondary | ICD-10-CM | POA: Diagnosis present

## 2018-11-28 DIAGNOSIS — J189 Pneumonia, unspecified organism: Secondary | ICD-10-CM | POA: Diagnosis not present

## 2018-11-28 DIAGNOSIS — Z6841 Body Mass Index (BMI) 40.0 and over, adult: Secondary | ICD-10-CM | POA: Diagnosis not present

## 2018-11-28 DIAGNOSIS — I110 Hypertensive heart disease with heart failure: Secondary | ICD-10-CM | POA: Diagnosis present

## 2018-11-28 DIAGNOSIS — I10 Essential (primary) hypertension: Secondary | ICD-10-CM | POA: Diagnosis not present

## 2018-11-28 DIAGNOSIS — I4819 Other persistent atrial fibrillation: Secondary | ICD-10-CM | POA: Diagnosis not present

## 2018-11-28 DIAGNOSIS — I4891 Unspecified atrial fibrillation: Secondary | ICD-10-CM | POA: Diagnosis not present

## 2018-11-28 DIAGNOSIS — G4733 Obstructive sleep apnea (adult) (pediatric): Secondary | ICD-10-CM | POA: Diagnosis not present

## 2018-11-28 DIAGNOSIS — E119 Type 2 diabetes mellitus without complications: Secondary | ICD-10-CM | POA: Diagnosis not present

## 2018-11-28 DIAGNOSIS — Z7901 Long term (current) use of anticoagulants: Secondary | ICD-10-CM | POA: Diagnosis not present

## 2018-11-28 DIAGNOSIS — Z884 Allergy status to anesthetic agent status: Secondary | ICD-10-CM | POA: Diagnosis not present

## 2018-11-28 DIAGNOSIS — R000 Tachycardia, unspecified: Secondary | ICD-10-CM | POA: Diagnosis not present

## 2018-12-01 ENCOUNTER — Telehealth (INDEPENDENT_AMBULATORY_CARE_PROVIDER_SITE_OTHER): Payer: Medicare Other | Admitting: Internal Medicine

## 2018-12-01 DIAGNOSIS — G4733 Obstructive sleep apnea (adult) (pediatric): Secondary | ICD-10-CM | POA: Diagnosis not present

## 2018-12-01 DIAGNOSIS — I4819 Other persistent atrial fibrillation: Secondary | ICD-10-CM | POA: Diagnosis not present

## 2018-12-01 DIAGNOSIS — I509 Heart failure, unspecified: Secondary | ICD-10-CM | POA: Diagnosis not present
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
## 2018-12-01 NOTE — Patient Instructions (Addendum)
Medication Instructions:  Continue all current medications.  Labwork: none  Testing/Procedures: none  Follow-Up: 2 weeks in the AFib clinic - someone from office will contact you with date & time.  Any Other Special Instructions Will Be Listed Below (If Applicable).  If you need a refill on your cardiac medications before your next appointment, please call your pharmacy.

## 2018-12-06 DIAGNOSIS — H25812 Combined forms of age-related cataract, left eye: Secondary | ICD-10-CM | POA: Diagnosis not present

## 2018-12-06 DIAGNOSIS — H25811 Combined forms of age-related cataract, right eye: Secondary | ICD-10-CM | POA: Diagnosis not present

## 2018-12-06 DIAGNOSIS — H401213 Low-tension glaucoma, right eye, severe stage: Secondary | ICD-10-CM | POA: Diagnosis not present

## 2018-12-06 DIAGNOSIS — H401222 Low-tension glaucoma, left eye, moderate stage: Secondary | ICD-10-CM | POA: Diagnosis not present

## 2018-12-08 DIAGNOSIS — E1165 Type 2 diabetes mellitus with hyperglycemia: Secondary | ICD-10-CM | POA: Diagnosis not present

## 2018-12-08 DIAGNOSIS — I482 Chronic atrial fibrillation, unspecified: Secondary | ICD-10-CM | POA: Diagnosis not present

## 2018-12-08 DIAGNOSIS — I10 Essential (primary) hypertension: Secondary | ICD-10-CM | POA: Diagnosis not present

## 2018-12-12 ENCOUNTER — Other Ambulatory Visit: Payer: Self-pay | Admitting: Endocrinology

## 2018-12-12 DIAGNOSIS — Z0001 Encounter for general adult medical examination with abnormal findings: Secondary | ICD-10-CM | POA: Diagnosis not present

## 2018-12-12 DIAGNOSIS — M1990 Unspecified osteoarthritis, unspecified site: Secondary | ICD-10-CM | POA: Diagnosis not present

## 2018-12-12 DIAGNOSIS — Z6841 Body Mass Index (BMI) 40.0 and over, adult: Secondary | ICD-10-CM | POA: Diagnosis not present

## 2018-12-12 DIAGNOSIS — E1165 Type 2 diabetes mellitus with hyperglycemia: Secondary | ICD-10-CM | POA: Diagnosis not present

## 2018-12-16 ENCOUNTER — Other Ambulatory Visit: Payer: Self-pay | Admitting: Endocrinology

## 2018-12-18 ENCOUNTER — Ambulatory Visit (HOSPITAL_COMMUNITY)
Admission: RE | Admit: 2018-12-18 | Discharge: 2018-12-18 | Disposition: A | Discharge status: Home / Self Care | Payer: Medicare Other | Source: Ambulatory Visit | Attending: Nurse Practitioner | Admitting: Nurse Practitioner

## 2018-12-18 ENCOUNTER — Other Ambulatory Visit: Payer: Self-pay

## 2018-12-18 ENCOUNTER — Encounter (HOSPITAL_COMMUNITY): Payer: Self-pay | Admitting: Nurse Practitioner

## 2018-12-18 VITALS — BP 122/70 | HR 47 | Ht 71.0 in | Wt 338.4 lb

## 2018-12-18 DIAGNOSIS — I5032 Chronic diastolic (congestive) heart failure: Secondary | ICD-10-CM | POA: Insufficient documentation

## 2018-12-18 DIAGNOSIS — Z7901 Long term (current) use of anticoagulants: Secondary | ICD-10-CM | POA: Insufficient documentation

## 2018-12-18 DIAGNOSIS — I2510 Atherosclerotic heart disease of native coronary artery without angina pectoris: Secondary | ICD-10-CM | POA: Insufficient documentation

## 2018-12-18 DIAGNOSIS — I4819 Other persistent atrial fibrillation: Secondary | ICD-10-CM | POA: Diagnosis not present

## 2018-12-18 DIAGNOSIS — M160 Bilateral primary osteoarthritis of hip: Secondary | ICD-10-CM | POA: Diagnosis not present

## 2018-12-18 DIAGNOSIS — G4733 Obstructive sleep apnea (adult) (pediatric): Secondary | ICD-10-CM | POA: Diagnosis not present

## 2018-12-18 DIAGNOSIS — Z6841 Body Mass Index (BMI) 40.0 and over, adult: Secondary | ICD-10-CM | POA: Insufficient documentation

## 2018-12-18 DIAGNOSIS — Z79899 Other long term (current) drug therapy: Secondary | ICD-10-CM | POA: Diagnosis not present

## 2018-12-18 DIAGNOSIS — D6869 Other thrombophilia: Secondary | ICD-10-CM | POA: Diagnosis not present

## 2018-12-18 DIAGNOSIS — E7800 Pure hypercholesterolemia, unspecified: Secondary | ICD-10-CM | POA: Diagnosis not present

## 2018-12-18 DIAGNOSIS — K219 Gastro-esophageal reflux disease without esophagitis: Secondary | ICD-10-CM | POA: Diagnosis not present

## 2018-12-18 DIAGNOSIS — I110 Hypertensive heart disease with heart failure: Secondary | ICD-10-CM | POA: Insufficient documentation

## 2018-12-18 DIAGNOSIS — Z85828 Personal history of other malignant neoplasm of skin: Secondary | ICD-10-CM | POA: Insufficient documentation

## 2018-12-18 DIAGNOSIS — Z794 Long term (current) use of insulin: Secondary | ICD-10-CM | POA: Insufficient documentation

## 2018-12-18 DIAGNOSIS — E119 Type 2 diabetes mellitus without complications: Secondary | ICD-10-CM | POA: Insufficient documentation

## 2018-12-18 DIAGNOSIS — M170 Bilateral primary osteoarthritis of knee: Secondary | ICD-10-CM | POA: Diagnosis not present

## 2018-12-18 DIAGNOSIS — I491 Atrial premature depolarization: Secondary | ICD-10-CM | POA: Diagnosis not present

## 2018-12-18 MED ORDER — METOPROLOL TARTRATE 25 MG PO TABS: 12.5000 mg | ORAL_TABLET | Freq: Two times a day (BID) | ORAL | 2 refills | Status: DC
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
## 2018-12-18 NOTE — Addendum Note (Signed)
Encounter addended by: Sherran Needs, NP on: 12/18/2018 3:10 PM  Actions taken: Visit diagnoses modified

## 2018-12-25 ENCOUNTER — Other Ambulatory Visit: Payer: Self-pay | Admitting: *Deleted

## 2018-12-25 ENCOUNTER — Encounter: Payer: Self-pay | Admitting: *Deleted

## 2018-12-25 DIAGNOSIS — I10 Essential (primary) hypertension: Secondary | ICD-10-CM | POA: Diagnosis not present

## 2018-12-25 DIAGNOSIS — E1165 Type 2 diabetes mellitus with hyperglycemia: Secondary | ICD-10-CM | POA: Diagnosis not present

## 2018-12-25 MED ORDER — METOPROLOL TARTRATE 25 MG PO TABS: 12.5000 mg | ORAL_TABLET | Freq: Two times a day (BID) | ORAL | 1 refills | Status: DC

## 2018-12-26 ENCOUNTER — Ambulatory Visit (INDEPENDENT_AMBULATORY_CARE_PROVIDER_SITE_OTHER): Payer: Medicare Other | Admitting: Diagnostic Neuroimaging

## 2018-12-26 ENCOUNTER — Other Ambulatory Visit: Payer: Self-pay

## 2018-12-26 ENCOUNTER — Encounter: Payer: Self-pay | Admitting: Diagnostic Neuroimaging

## 2018-12-26 VITALS — BP 161/77 | HR 46 | Temp 97.9°F | Ht 71.0 in | Wt 339.2 lb

## 2018-12-26 DIAGNOSIS — R251 Tremor, unspecified: Secondary | ICD-10-CM | POA: Diagnosis not present

## 2018-12-26 DIAGNOSIS — R269 Unspecified abnormalities of gait and mobility: Secondary | ICD-10-CM
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
## 2018-12-27 ENCOUNTER — Encounter: Payer: Self-pay | Admitting: Diagnostic Neuroimaging

## 2019-01-03 DIAGNOSIS — N419 Inflammatory disease of prostate, unspecified: Secondary | ICD-10-CM | POA: Diagnosis not present

## 2019-01-03 DIAGNOSIS — N401 Enlarged prostate with lower urinary tract symptoms: Secondary | ICD-10-CM | POA: Diagnosis not present

## 2019-01-03 DIAGNOSIS — R351 Nocturia: Secondary | ICD-10-CM | POA: Diagnosis not present

## 2019-01-08 ENCOUNTER — Other Ambulatory Visit: Payer: Self-pay | Admitting: *Deleted

## 2019-01-08 DIAGNOSIS — R251 Tremor, unspecified: Secondary | ICD-10-CM

## 2019-01-11 ENCOUNTER — Other Ambulatory Visit: Payer: Self-pay | Admitting: Endocrinology

## 2019-01-12 ENCOUNTER — Other Ambulatory Visit: Payer: Self-pay | Admitting: Endocrinology

## 2019-01-15 ENCOUNTER — Other Ambulatory Visit: Payer: Self-pay

## 2019-01-15 MED ORDER — OZEMPIC (1 MG/DOSE) 2 MG/1.5ML ~~LOC~~ SOPN: 1.0000 mg | PEN_INJECTOR | SUBCUTANEOUS | 2 refills | Status: DC

## 2019-01-31 ENCOUNTER — Other Ambulatory Visit: Payer: Self-pay | Admitting: Endocrinology

## 2019-02-01 ENCOUNTER — Other Ambulatory Visit: Payer: Self-pay

## 2019-02-01 MED ORDER — GLUCOSE BLOOD VI STRP: ORAL_STRIP | 3 refills | Status: DC

## 2019-02-05 ENCOUNTER — Other Ambulatory Visit: Payer: Self-pay

## 2019-02-05 MED ORDER — GLUCOSE BLOOD VI STRP: ORAL_STRIP | 3 refills | Status: DC

## 2019-02-06 ENCOUNTER — Ambulatory Visit: Payer: Medicare Other | Admitting: Endocrinology

## 2019-02-14 DIAGNOSIS — C44319 Basal cell carcinoma of skin of other parts of face: Secondary | ICD-10-CM | POA: Diagnosis not present

## 2019-02-20 ENCOUNTER — Other Ambulatory Visit: Payer: Self-pay | Admitting: *Deleted

## 2019-02-20 MED ORDER — AMIODARONE HCL 200 MG PO TABS: 200.0000 mg | ORAL_TABLET | Freq: Every day | ORAL | 2 refills | Status: DC

## 2019-03-01 ENCOUNTER — Other Ambulatory Visit: Payer: Self-pay | Admitting: Internal Medicine

## 2019-03-01 MED ORDER — APIXABAN 5 MG PO TABS: 5.0000 mg | ORAL_TABLET | Freq: Two times a day (BID) | ORAL | 2 refills | Status: DC

## 2019-03-04 ENCOUNTER — Other Ambulatory Visit: Payer: Self-pay | Admitting: Endocrinology

## 2019-03-04 ENCOUNTER — Other Ambulatory Visit: Payer: Self-pay | Admitting: Internal Medicine

## 2019-03-08 DIAGNOSIS — Z23 Encounter for immunization: Secondary | ICD-10-CM | POA: Diagnosis not present

## 2019-03-12 ENCOUNTER — Ambulatory Visit (INDEPENDENT_AMBULATORY_CARE_PROVIDER_SITE_OTHER): Payer: Medicare Other | Admitting: Endocrinology

## 2019-03-12 ENCOUNTER — Encounter: Payer: Self-pay | Admitting: Endocrinology

## 2019-03-12 ENCOUNTER — Other Ambulatory Visit: Payer: Self-pay

## 2019-03-12 VITALS — BP 174/76 | HR 55 | Ht 71.0 in | Wt 345.8 lb

## 2019-03-12 DIAGNOSIS — E1165 Type 2 diabetes mellitus with hyperglycemia: Secondary | ICD-10-CM

## 2019-03-12 DIAGNOSIS — Z794 Long term (current) use of insulin: Secondary | ICD-10-CM

## 2019-03-12 DIAGNOSIS — I10 Essential (primary) hypertension: Secondary | ICD-10-CM

## 2019-03-12 DIAGNOSIS — E782 Mixed hyperlipidemia: Secondary | ICD-10-CM | POA: Diagnosis not present

## 2019-03-12 LAB — COMPREHENSIVE METABOLIC PANEL
ALT: 35 U/L (ref 0–53)
AST: 25 U/L (ref 0–37)
Albumin: 4.6 g/dL (ref 3.5–5.2)
Alkaline Phosphatase: 52 U/L (ref 39–117)
BUN: 16 mg/dL (ref 6–23)
CO2: 27 mEq/L (ref 19–32)
Calcium: 9.9 mg/dL (ref 8.4–10.5)
Chloride: 101 mEq/L (ref 96–112)
Creatinine, Ser: 0.81 mg/dL (ref 0.40–1.50)
GFR: 94.7 mL/min (ref >60.00)
Glucose, Bld: 157 mg/dL — ABNORMAL HIGH (ref 70–99)
Potassium: 3.9 mEq/L (ref 3.5–5.1)
Sodium: 137 mEq/L (ref 135–145)
Total Bilirubin: 0.4 mg/dL (ref 0.2–1.2)
Total Protein: 7.9 g/dL (ref 6.0–8.3)

## 2019-03-12 LAB — POCT GLYCOSYLATED HEMOGLOBIN (HGB A1C): Hemoglobin A1C: 6.9 % — AB (ref 4.0–5.6)

## 2019-03-12 LAB — LDL CHOLESTEROL, DIRECT: Direct LDL: 90 mg/dL
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
## 2019-03-12 NOTE — Patient Instructions (Addendum)
Check blood sugars on waking up 3-4 days a week  Also check blood sugars about 2 hours after meals and do this after different meals by rotation  Recommended blood sugar levels on waking up are 90-130 and about 2 hours after meal is 130-180  Please bring your blood sugar monitor to each visit, thank you  More walking  40 Simvastatin daily  [employer] BP meter

## 2019-03-13 MED ORDER — LOSARTAN POTASSIUM-HCTZ 50-12.5 MG PO TABS: 1.0000 | ORAL_TABLET | Freq: Every day | ORAL | 1 refills | Status: DC

## 2019-03-23 DIAGNOSIS — E7849 Other hyperlipidemia: Secondary | ICD-10-CM | POA: Diagnosis not present

## 2019-03-23 DIAGNOSIS — I10 Essential (primary) hypertension: Secondary | ICD-10-CM | POA: Diagnosis not present

## 2019-04-01 ENCOUNTER — Other Ambulatory Visit: Payer: Self-pay | Admitting: Endocrinology

## 2019-04-04 ENCOUNTER — Other Ambulatory Visit: Payer: Self-pay | Admitting: Endocrinology

## 2019-04-05 ENCOUNTER — Other Ambulatory Visit: Payer: Self-pay

## 2019-04-05 MED ORDER — TRESIBA FLEXTOUCH 200 UNIT/ML ~~LOC~~ SOPN: 60.0000 [IU] | PEN_INJECTOR | Freq: Every day | SUBCUTANEOUS | 1 refills | Status: DC

## 2019-04-05 MED ORDER — NOVOLOG FLEXPEN 100 UNIT/ML ~~LOC~~ SOPN: PEN_INJECTOR | SUBCUTANEOUS | 1 refills | Status: DC

## 2019-04-06 DIAGNOSIS — Z23 Encounter for immunization: Secondary | ICD-10-CM | POA: Diagnosis not present

## 2019-04-12 ENCOUNTER — Other Ambulatory Visit: Payer: Self-pay | Admitting: Internal Medicine

## 2019-04-23 ENCOUNTER — Encounter: Payer: Self-pay | Admitting: *Deleted

## 2019-04-25 DIAGNOSIS — I10 Essential (primary) hypertension: Secondary | ICD-10-CM | POA: Diagnosis not present

## 2019-04-25 DIAGNOSIS — E7849 Other hyperlipidemia: Secondary | ICD-10-CM | POA: Diagnosis not present

## 2019-05-02 ENCOUNTER — Ambulatory Visit (INDEPENDENT_AMBULATORY_CARE_PROVIDER_SITE_OTHER): Payer: Medicare Other | Admitting: Orthopedic Surgery

## 2019-05-02 ENCOUNTER — Ambulatory Visit: Payer: Medicare Other

## 2019-05-02 ENCOUNTER — Encounter: Payer: Self-pay | Admitting: Orthopedic Surgery

## 2019-05-02 ENCOUNTER — Other Ambulatory Visit: Payer: Self-pay

## 2019-05-02 VITALS — BP 140/75 | HR 57 | Ht 71.0 in | Wt 342.0 lb

## 2019-05-02 DIAGNOSIS — G8929 Other chronic pain: Secondary | ICD-10-CM

## 2019-05-02 DIAGNOSIS — M25562 Pain in left knee: Secondary | ICD-10-CM | POA: Diagnosis not present

## 2019-05-02 MED ORDER — TRAMADOL-ACETAMINOPHEN 37.5-325 MG PO TABS: 1.0000 | ORAL_TABLET | ORAL | 5 refills | Status: DC | PRN

## 2019-05-02 NOTE — Patient Instructions (Addendum)
You have received an injection of steroids into the joint. 15% of patients will have increased pain within the 24 hours postinjection.   This is transient and will go away.   We recommend that you use ice packs on the injection site for 20 minutes every 2 hours and extra strength Tylenol 2 tablets every 8 as needed until the pain resolves.  If you continue to have pain after taking the Tylenol and using the ice please call the office for further instructions.   Osteoarthritis  Osteoarthritis is a type of arthritis that affects tissue that covers the ends of bones in joints (cartilage). Cartilage acts as a cushion between the bones and helps them move smoothly. Osteoarthritis results when cartilage in the joints gets worn down. Osteoarthritis is sometimes called "wear and tear" arthritis. Osteoarthritis is the most common form of arthritis. It often occurs in older people. It is a condition that gets worse over time (a progressive condition). Joints that are most often affected by this condition are in:  Fingers.  Toes.  Hips.  Knees.  Spine, including neck and lower back. What are the causes? This condition is caused by age-related wearing down of cartilage that covers the ends of bones. What increases the risk? The following factors may make you more likely to develop this condition:  Older age.  Being overweight or obese.  Overuse of joints, such as in athletes.  Past injury of a joint.  Past surgery on a joint.  Family history of osteoarthritis. What are the signs or symptoms? The main symptoms of this condition are pain, swelling, and stiffness in the joint. The joint may lose its shape over time. Small pieces of bone or cartilage may break off and float inside of the joint, which may cause more pain and damage to the joint. Small deposits of bone (osteophytes) may grow on the edges of the joint. Other symptoms may include:  A grating or scraping feeling inside the joint  when you move it.  Popping or creaking sounds when you move. Symptoms may affect one or more joints. Osteoarthritis in a major joint, such as your knee or hip, can make it painful to walk or exercise. If you have osteoarthritis in your hands, you might not be able to grip items, twist your hand, or control small movements of your hands and fingers (fine motor skills). How is this diagnosed? This condition may be diagnosed based on:  Your medical history.  A physical exam.  Your symptoms.  X-rays of the affected joint(s).  Blood tests to rule out other types of arthritis. How is this treated? There is no cure for this condition, but treatment can help to control pain and improve joint function. Treatment plans may include:  A prescribed exercise program that allows for rest and joint relief. You may work with a physical therapist.  A weight control plan.  Pain relief techniques, such as: ? Applying heat and cold to the joint. ? Electric pulses delivered to nerve endings under the skin (transcutaneous electrical nerve stimulation, or TENS). ? Massage. ? Certain nutritional supplements.  NSAIDs or prescription medicines to help relieve pain.  Medicine to help relieve pain and inflammation (corticosteroids). This can be given by mouth (orally) or as an injection.  Assistive devices, such as a brace, wrap, splint, specialized glove, or cane.  Surgery, such as: ? An osteotomy. This is done to reposition the bones and relieve pain or to remove loose pieces of bone and cartilage. ?  Joint replacement surgery. You may need this surgery if you have very bad (advanced) osteoarthritis. Follow these instructions at home: Activity  Rest your affected joints as directed by your health care provider.  Do not drive or use heavy machinery while taking prescription pain medicine.  Exercise as directed. Your health care provider or physical therapist may recommend specific types of exercise,  such as: ? Strengthening exercises. These are done to strengthen the muscles that support joints that are affected by arthritis. They can be performed with weights or with exercise bands to add resistance. ? Aerobic activities. These are exercises, such as brisk walking or water aerobics, that get your heart pumping. ? Range-of-motion activities. These keep your joints easy to move. ? Balance and agility exercises. Managing pain, stiffness, and swelling      If directed, apply heat to the affected area as often as told by your health care provider. Use the heat source that your health care provider recommends, such as a moist heat pack or a heating pad. ? If you have a removable assistive device, remove it as told by your health care provider. ? Place a towel between your skin and the heat source. If your health care provider tells you to keep the assistive device on while you apply heat, place a towel between the assistive device and the heat source. ? Leave the heat on for 20-30 minutes. ? Remove the heat if your skin turns bright red. This is especially important if you are unable to feel pain, heat, or cold. You may have a greater risk of getting burned.  If directed, put ice on the affected joint: ? If you have a removable assistive device, remove it as told by your health care provider. ? Put ice in a plastic bag. ? Place a towel between your skin and the bag. If your health care provider tells you to keep the assistive device on during icing, place a towel between the assistive device and the bag. ? Leave the ice on for 20 minutes, 2-3 times a day. General instructions  Take over-the-counter and prescription medicines only as told by your health care provider.  Maintain a healthy weight. Follow instructions from your health care provider for weight control. These may include dietary restrictions.  Do not use any products that contain nicotine or tobacco, such as cigarettes and  e-cigarettes. These can delay bone healing. If you need help quitting, ask your health care provider.  Use assistive devices as directed by your health care provider.  Keep all follow-up visits as told by your health care provider. This is important. Where to find more information  [employer] of Arthritis and Musculoskeletal and Skin Diseases: www.niams.SouthExposed.es  [employer] on Aging: http://kim-miller.com/  American College of Rheumatology: www.rheumatology.org Contact a health care provider if:  Your skin turns red.  You develop a rash.  You have pain that gets worse.  You have a fever along with joint or muscle aches. Get help right away if:  You lose a lot of weight.  You suddenly lose your appetite.  You have night sweats. Summary  Osteoarthritis is a type of arthritis that affects tissue covering the ends of bones in joints (cartilage).  This condition is caused by age-related wearing down of cartilage that covers the ends of bones.  The main symptom of this condition is pain, swelling, and stiffness in the joint.  There is no cure for this condition, but treatment can help to control pain and  improve joint function. This information is not intended to replace advice given to you by your health care provider. Make sure you discuss any questions you have with your health care provider. Document Revised: 12/24/2016 Document Reviewed: 09/15/2015 Elsevier Patient Education  2020 [employer].

## 2019-05-02 NOTE — Progress Notes (Signed)
Lawrence Jordan  05/02/2019  Body mass index is 47.7 kg/m.   HISTORY SECTION :  Chief Complaint  Patient presents with  . Knee Pain    left/ has been painful for years but worse after fall 2 months ago    68 presents with left knee pain.  He has a congestive heart failure diabetes obesity he has had a knee scope in the past he was told by that doctor that he would need a knee replacement.  He is currently on Celebrex and Eliquis he also has atrial fibrillation  System review see below    ROS   Shortness of breath on occasion orthopnea leg swelling difficulty urinating frequency tingling and tremor recently noted balance issues currently seeking CT scan but has allergies to dye and will need a hospital CT scan with standby emergency equipment available.  Easy bleeding and bruising from the Eliquis most likely other systems were negative     has a past medical history of Atrial fibrillation ([place]), Back fracture (69 yrs old), Basal cell carcinoma (06/11/2014), BCC (basal cell carcinoma) (07/18/2014), CHF (congestive heart failure) ([place]), Collagen vascular disease ([place]), Coronary artery disease, Dyspnea, GERD (gastroesophageal reflux disease), Hypercholesterolemia, Hypertension, Morbid obesity ([place]) (11/22/2017), OA (osteoarthritis), Obesity, OSA (obstructive sleep apnea) (03/22/2016), Persistent atrial fibrillation ([place]), and Type 2 diabetes mellitus ([place]).   Past Surgical History:  Procedure Laterality Date  . ABLATION  10/18/13   PVI and CTI by Dr [name]  . ATRIAL FIBRILLATION ABLATION N/A 10/18/2013   Procedure: ATRIAL FIBRILLATION ABLATION;  Surgeon: [name], MD;  Location: [place] CATH LAB;  Service: Cardiovascular;  Laterality: N/A;  . CARDIAC CATHETERIZATION  04/29/2010   30-40% ostial left main stenosis (seemed worse in certain views but FFR was only 0.95, IVUS  was fine also), LAD: 20-30% disease, RCA: 40% proximal  . CARDIOVERSION N/A 11/01/2012   Procedure: CARDIOVERSION;   Surgeon: [name], MD;  Location: [place];  Service: Cardiovascular;  Laterality: N/A;  . CARDIOVERSION N/A 11/19/2015   Procedure: CARDIOVERSION;  Surgeon: [name], MD;  Location: [place];  Service: Cardiovascular;  Laterality: N/A;  . CARDIOVERSION N/A 06/04/2016   Procedure: CARDIOVERSION;  Surgeon: [name], MD;  Location: [facility] ENDOSCOPY;  Service: Cardiovascular;  Laterality: N/A;  . CARDIOVERSION N/A 07/22/2016   Procedure: CARDIOVERSION;  Surgeon: [name], MD;  Location: [facility] ENDOSCOPY;  Service: Cardiovascular;  Laterality: N/A;  . CARDIOVERSION N/A 03/28/2017   Procedure: CARDIOVERSION;  Surgeon: [name], MD;  Location: [facility] ENDOSCOPY;  Service: Cardiovascular;  Laterality: N/A;  . COLONOSCOPY N/A 11/03/2012   Procedure: COLONOSCOPY;  Surgeon: [name], MD;  Location: [facility] ENDOSCOPY;  Service: Endoscopy;  Laterality: N/A;  . ESOPHAGOGASTRODUODENOSCOPY N/A 11/03/2012   Procedure: ESOPHAGOGASTRODUODENOSCOPY (EGD);  Surgeon: [name], MD;  Location: [facility] ENDOSCOPY;  Service: Endoscopy;  Laterality: N/A;  . KNEE SURGERY  10 yrs ago   "cleaned out"  . TEE WITHOUT CARDIOVERSION N/A 11/01/2012   Procedure: TRANSESOPHAGEAL ECHOCARDIOGRAM (TEE);  Surgeon: [name], MD;  Location: [place];  Service: Cardiovascular;  Laterality: N/A;  . TEE WITHOUT CARDIOVERSION N/A 10/17/2013   Procedure: TRANSESOPHAGEAL ECHOCARDIOGRAM (TEE);  Surgeon: Candee Furbish, MD;  Location: [facility] ENDOSCOPY;  Service: Cardiovascular;  Laterality: N/A;  . TEE WITHOUT CARDIOVERSION N/A 03/28/2017   Procedure: TRANSESOPHAGEAL ECHOCARDIOGRAM (TEE);  Surgeon: [name], MD;  Location: [facility] ENDOSCOPY;  Service: Cardiovascular;  Laterality: N/A;  . TONSILLECTOMY    . TOTAL HIP ARTHROPLASTY  69  yrs old   Left  . TOTAL HIP ARTHROPLASTY Right 03/14/2013   Procedure: TOTAL HIP ARTHROPLASTY;  Surgeon: Ninetta Lights, MD;  Location: [place];  Service: Orthopedics;  Laterality:  Right;  and steroid injection into left knee.    Body mass index is 47.7 kg/m.   Allergies  Allergen Reactions  . Iodinated Diagnostic Agents Anaphylaxis  . Sulfa Antibiotics Anaphylaxis and Rash  . Adhesive [Tape] Other (See Comments)    Blisters PAPER TAPE ONLY  . Amoxicillin Hives and Other (See Comments)    Has patient had a PCN reaction causing immediate rash, facial/tongue/throat swelling, SOB or lightheadedness with hypotension: No Has patient had a PCN reaction causing severe rash involving mucus membranes or skin necrosis:Yes--blisters around mouth Has patient had a PCN reaction that required hospitalization: No Has patient had a PCN reaction occurring within the last 10 years: Yes If all of the above answers are "NO", then may proceed with Cephalosporin use.      Current Outpatient Medications:  .  acetaminophen (TYLENOL) 500 MG tablet, Take 1,000 mg by mouth 2 (two) times daily., Disp: , Rfl:  .  amiodarone (PACERONE) 200 MG tablet, Take 1 tablet (200 mg total) by mouth daily., Disp: 90 tablet, Rfl: 2 .  apixaban (ELIQUIS) 5 MG TABS tablet, Take 1 tablet (5 mg total) by mouth 2 (two) times daily., Disp: 180 tablet, Rfl: 2 .  Brimonidine Tartrate 0.025 % SOLN, , Disp: , Rfl:  .  calcium-vitamin D 250-100 MG-UNIT tablet, Take 1 tablet by mouth daily. , Disp: , Rfl:  .  celecoxib (CELEBREX) 200 MG capsule, Take 200 mg by mouth at bedtime. , Disp: , Rfl:  .  furosemide (LASIX) 40 MG tablet, TAKE 1 TABLET BY MOUTH EVERY DAY, Disp: 90 tablet, Rfl: 1 .  glucose blood test strip, Use OneTouch Verio Test Strips as instructed to check blood sugar 2 times daily.DX:E11.65, Disp: 100 each, Rfl: 3 .  insulin aspart (NOVOLOG FLEXPEN) 100 UNIT/ML FlexPen, INJECT 16 UNITS UNDER THE SKIN 3 TIMES DAILY, Disp: 15 mL, Rfl: 1 .  insulin degludec (TRESIBA FLEXTOUCH) 200 UNIT/ML FlexTouch Pen, Inject 60 Units into the skin daily., Disp: 30 mL, Rfl: 1 .  Insulin Pen Needle (PEN NEEDLES) 32G X 4  MM MISC, 1 each by Does not apply route 4 (four) times daily. Use Pen needle as instructed to inject insulin 4 times daily., Disp: 200 each, Rfl: 4 .  JARDIANCE 25 MG TABS tablet, TAKE 1 TABLET BY MOUTH EVERY DAY, Disp: 90 tablet, Rfl: 1 .  KLOR-CON M20 20 MEQ tablet, TAKE 1 TABLET BY MOUTH TWICE A DAY, Disp: 180 tablet, Rfl: 1 .  latanoprost (XALATAN) 0.005 % ophthalmic solution, Place 1 drop into both eyes daily., Disp: , Rfl: 11 .  losartan-hydrochlorothiazide (HYZAAR) 50-12.5 MG tablet, Take 1 tablet by mouth daily., Disp: 90 tablet, Rfl: 1 .  Magnesium 200 MG TABS, Take 1 tablet (200 mg total) by mouth daily. (Patient taking differently: Take 250 mg by mouth at bedtime. ), Disp: 30 each, Rfl:  .  metFORMIN (GLUCOPHAGE) 1000 MG tablet, TAKE 1 TABLET (1,000 MG TOTAL) BY MOUTH 2 (TWO) TIMES DAILY WITH A MEAL., Disp: 180 tablet, Rfl: 3 .  Multiple Vitamin (MULTIVITAMIN) tablet, Take 1 tablet by mouth daily.  , Disp: , Rfl:  .  ONETOUCH DELICA LANCETS FINE MISC, USE TO CHECK BLOOD SUGAR 2 TIMES PER DAY dx code E11.9, Disp: 100 each, Rfl: 5 .  pantoprazole (PROTONIX) 40  MG tablet, TAKE 1 TABLET BY MOUTH EVERY DAY, Disp: 90 tablet, Rfl: 1 .  Semaglutide, 1 MG/DOSE, (OZEMPIC, 1 MG/DOSE,) 2 MG/1.5ML SOPN, Inject 1 mg into the skin once a week. Inject 1.0mg  under the skin once weekly., Disp: 9 mL, Rfl: 2 .  simvastatin (ZOCOR) 20 MG tablet, TAKE 1 TABLET DAILY AT 6 PM., Disp: 90 tablet, Rfl: 2 .  tamsulosin (FLOMAX) 0.4 MG CAPS capsule, SMARTSIG:1 Capsule(s) By Mouth Every Evening, Disp: , Rfl:  .  metoprolol tartrate (LOPRESSOR) 25 MG tablet, Take 0.5 tablets (12.5 mg total) by mouth 2 (two) times daily., Disp: 90 tablet, Rfl: 1 .  traMADol-acetaminophen (ULTRACET) 37.5-325 MG tablet, Take 1 tablet by mouth every 4 (four) hours as needed., Disp: 90 tablet, Rfl: 5   PHYSICAL EXAM SECTION: 1) BP 140/75   Pulse (!) 57   Ht 5\' 11"  (1.803 m)   Wt (!) 342 lb (155.1 kg)   BMI 47.70 kg/m   Body mass index  is 47.7 kg/m. General appearance: Well-developed well-nourished no gross deformities  2) Cardiovascular normal pulse and perfusion , normal color   3) Neurologically deep tendon reflexes are equal and normal, no sensation loss or deficits no pathologic reflexes  4) Psychological: Awake alert and oriented x3 mood and affect normal  5) Skin no lacerations or ulcerations no nodularity no palpable masses, no erythema or nodularity  6) Musculoskeletal:   Tenderness medial joint line left knee 3-1 25 range of motion stability tests were normal meniscal signs were negative muscle tone was excellent  No joint effusion if there was one it was small  X-ray shows a varus knee less than 10 degrees medial compartment grade 4 disease 3 compartment secondary bone changes     MEDICAL DECISION MAKING  A.  Encounter Diagnosis  Name Primary?  . Chronic pain of left knee Yes    B. DATA ANALYSED:  IMAGING: Independent interpretation of images: No Orders: No Outside records reviewed: No  C. MANAGEMENT   I discussed with Lawrence Jordan that he is going to need weight loss either BMI of 40 or 25 pound weight loss then he will need a cardiac work-up medical clearance but today I gave him an injection put him on tramadol and continued his Celebrex  Come back 3 months  Meds ordered this encounter  Medications  . traMADol-acetaminophen (ULTRACET) 37.5-325 MG tablet    Sig: Take 1 tablet by mouth every 4 (four) hours as needed.    Dispense:  90 tablet    Refill:  5      Arther Abbott, MD  05/02/2019 4:29 PM

## 2019-05-11 DIAGNOSIS — H401222 Low-tension glaucoma, left eye, moderate stage: Secondary | ICD-10-CM | POA: Diagnosis not present

## 2019-05-11 DIAGNOSIS — H25811 Combined forms of age-related cataract, right eye: Secondary | ICD-10-CM | POA: Diagnosis not present

## 2019-05-11 DIAGNOSIS — H401213 Low-tension glaucoma, right eye, severe stage: Secondary | ICD-10-CM | POA: Diagnosis not present

## 2019-05-11 DIAGNOSIS — H25812 Combined forms of age-related cataract, left eye: Secondary | ICD-10-CM | POA: Diagnosis not present

## 2019-05-15 NOTE — Telephone Encounter (Signed)
[facility] has tried to reach out to the patient twice. On 01/08/19 & 01/15/19. He just needs to contact them to reschedule.

## 2019-05-16 ENCOUNTER — Other Ambulatory Visit: Payer: Self-pay | Admitting: Endocrinology

## 2019-05-16 ENCOUNTER — Telehealth: Payer: Self-pay | Admitting: *Deleted

## 2019-05-16 ENCOUNTER — Other Ambulatory Visit: Payer: Self-pay | Admitting: Internal Medicine

## 2019-05-16 NOTE — Telephone Encounter (Signed)
Received my chart from patient stating he cannot get MRI scheduled. He stated [facility] told him they can't do it due to his past issues with contrast dye. I called [facility] , spoke with scheduler, Oris Drone  Who stated they have the new order for MRI brain without contrast from 01/08/20. They have records of calling patient to schedule on  01/08/19 and 01/15/19 and they LVM both times. She stated he never called back, but he can call and schedule. She verified they have his correct number. I called patient and LVM advising him of above conversation. I replied to his my chart as well.

## 2019-05-17 ENCOUNTER — Other Ambulatory Visit: Payer: Self-pay | Admitting: Endocrinology

## 2019-05-18 NOTE — Telephone Encounter (Signed)
This is a [place] pt °

## 2019-06-01 ENCOUNTER — Other Ambulatory Visit: Payer: Medicare Other

## 2019-06-07 ENCOUNTER — Ambulatory Visit: Payer: Medicare Other | Admitting: Endocrinology

## 2019-06-10 ENCOUNTER — Other Ambulatory Visit: Payer: Self-pay | Admitting: Endocrinology

## 2019-06-10 DIAGNOSIS — Z794 Long term (current) use of insulin: Secondary | ICD-10-CM

## 2019-06-10 DIAGNOSIS — E1165 Type 2 diabetes mellitus with hyperglycemia: Secondary | ICD-10-CM

## 2019-06-10 DIAGNOSIS — E782 Mixed hyperlipidemia: Secondary | ICD-10-CM

## 2019-06-11 ENCOUNTER — Other Ambulatory Visit (INDEPENDENT_AMBULATORY_CARE_PROVIDER_SITE_OTHER): Payer: Medicare Other

## 2019-06-11 ENCOUNTER — Other Ambulatory Visit: Payer: Self-pay

## 2019-06-11 ENCOUNTER — Other Ambulatory Visit: Payer: Medicare Other

## 2019-06-11 DIAGNOSIS — E1165 Type 2 diabetes mellitus with hyperglycemia: Secondary | ICD-10-CM | POA: Diagnosis not present

## 2019-06-11 DIAGNOSIS — E782 Mixed hyperlipidemia: Secondary | ICD-10-CM

## 2019-06-11 DIAGNOSIS — Z794 Long term (current) use of insulin: Secondary | ICD-10-CM | POA: Diagnosis not present

## 2019-06-11 LAB — LIPID PANEL
Cholesterol: 146 mg/dL (ref 0–200)
HDL: 30.8 mg/dL — ABNORMAL LOW (ref >39.00)
NonHDL: 115.07
Total CHOL/HDL Ratio: 5
Triglycerides: 323 mg/dL — ABNORMAL HIGH (ref 0.0–149.0)
VLDL: 64.6 mg/dL — ABNORMAL HIGH (ref 0.0–40.0)

## 2019-06-11 LAB — COMPREHENSIVE METABOLIC PANEL
ALT: 35 U/L (ref 0–53)
AST: 26 U/L (ref 0–37)
Albumin: 4.3 g/dL (ref 3.5–5.2)
Alkaline Phosphatase: 49 U/L (ref 39–117)
BUN: 18 mg/dL (ref 6–23)
CO2: 25 mEq/L (ref 19–32)
Calcium: 9.2 mg/dL (ref 8.4–10.5)
Chloride: 101 mEq/L (ref 96–112)
Creatinine, Ser: 1.12 mg/dL (ref 0.40–1.50)
GFR: 65.1 mL/min (ref >60.00)
Glucose, Bld: 164 mg/dL — ABNORMAL HIGH (ref 70–99)
Potassium: 3.9 mEq/L (ref 3.5–5.1)
Sodium: 137 mEq/L (ref 135–145)
Total Bilirubin: 0.4 mg/dL (ref 0.2–1.2)
Total Protein: 7.1 g/dL (ref 6.0–8.3)

## 2019-06-11 LAB — MICROALBUMIN / CREATININE URINE RATIO
Creatinine,U: 36.2 mg/dL
Microalb Creat Ratio: 2 mg/g (ref 0.0–30.0)
Microalb, Ur: 0.7 mg/dL (ref 0.0–1.9)

## 2019-06-11 LAB — HEMOGLOBIN A1C: Hgb A1c MFr Bld: 7.3 % — ABNORMAL HIGH (ref 4.6–6.5)

## 2019-06-11 LAB — LDL CHOLESTEROL, DIRECT: Direct LDL: 80 mg/dL

## 2019-06-12 LAB — TSH: TSH: 1.5 u[IU]/mL (ref 0.35–4.50)

## 2019-06-14 ENCOUNTER — Encounter: Payer: Self-pay | Admitting: Endocrinology

## 2019-06-14 ENCOUNTER — Ambulatory Visit (INDEPENDENT_AMBULATORY_CARE_PROVIDER_SITE_OTHER): Payer: Medicare Other | Admitting: Endocrinology

## 2019-06-14 VITALS — BP 144/80 | HR 56 | Ht 71.0 in | Wt 345.2 lb

## 2019-06-14 DIAGNOSIS — E1165 Type 2 diabetes mellitus with hyperglycemia: Secondary | ICD-10-CM

## 2019-06-14 DIAGNOSIS — Z794 Long term (current) use of insulin: Secondary | ICD-10-CM

## 2019-06-14 DIAGNOSIS — E782 Mixed hyperlipidemia: Secondary | ICD-10-CM

## 2019-06-14 DIAGNOSIS — I10 Essential (primary) hypertension: Secondary | ICD-10-CM

## 2019-06-14 NOTE — Patient Instructions (Addendum)
Check blood sugars on waking up 3 days a week  Also check blood sugars about 2 hours after meals and do this after different meals by rotation  Recommended blood sugar levels on waking up are 100-130 and about 2 hours after meal is 130-160  Please bring your blood sugar monitor to each visit, thank you
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
## 2019-06-18 ENCOUNTER — Other Ambulatory Visit: Payer: Self-pay

## 2019-06-18 ENCOUNTER — Ambulatory Visit
Admission: RE | Admit: 2019-06-18 | Discharge: 2019-06-18 | Disposition: A | Discharge status: Home / Self Care | Payer: Medicare Other | Source: Ambulatory Visit | Attending: Diagnostic Neuroimaging | Admitting: Diagnostic Neuroimaging

## 2019-06-18 DIAGNOSIS — R251 Tremor, unspecified: Secondary | ICD-10-CM | POA: Diagnosis not present

## 2019-06-25 DIAGNOSIS — E1165 Type 2 diabetes mellitus with hyperglycemia: Secondary | ICD-10-CM | POA: Diagnosis not present

## 2019-06-25 DIAGNOSIS — I482 Chronic atrial fibrillation, unspecified: Secondary | ICD-10-CM | POA: Diagnosis not present

## 2019-06-25 DIAGNOSIS — I10 Essential (primary) hypertension: Secondary | ICD-10-CM | POA: Diagnosis not present

## 2019-06-25 DIAGNOSIS — Z794 Long term (current) use of insulin: Secondary | ICD-10-CM | POA: Diagnosis not present

## 2019-06-26 ENCOUNTER — Telehealth: Payer: Self-pay | Admitting: *Deleted

## 2019-06-26 NOTE — Telephone Encounter (Signed)
LVM requesting call back for MRI results. 

## 2019-06-27 ENCOUNTER — Encounter: Payer: Self-pay | Admitting: *Deleted

## 2019-07-19 DIAGNOSIS — E1165 Type 2 diabetes mellitus with hyperglycemia: Secondary | ICD-10-CM | POA: Diagnosis not present

## 2019-07-19 DIAGNOSIS — I482 Chronic atrial fibrillation, unspecified: Secondary | ICD-10-CM | POA: Diagnosis not present

## 2019-07-19 DIAGNOSIS — I10 Essential (primary) hypertension: Secondary | ICD-10-CM | POA: Diagnosis not present

## 2019-07-19 DIAGNOSIS — E7800 Pure hypercholesterolemia, unspecified: Secondary | ICD-10-CM | POA: Diagnosis not present

## 2019-07-23 ENCOUNTER — Other Ambulatory Visit: Payer: Self-pay | Admitting: Endocrinology

## 2019-07-24 DIAGNOSIS — F5221 Male erectile disorder: Secondary | ICD-10-CM | POA: Diagnosis not present

## 2019-07-24 DIAGNOSIS — I10 Essential (primary) hypertension: Secondary | ICD-10-CM | POA: Diagnosis not present

## 2019-07-24 DIAGNOSIS — E1165 Type 2 diabetes mellitus with hyperglycemia: Secondary | ICD-10-CM | POA: Diagnosis not present

## 2019-07-24 DIAGNOSIS — Z6841 Body Mass Index (BMI) 40.0 and over, adult: Secondary | ICD-10-CM | POA: Diagnosis not present

## 2019-07-31 ENCOUNTER — Encounter: Payer: Self-pay | Admitting: Orthopedic Surgery

## 2019-07-31 ENCOUNTER — Other Ambulatory Visit: Payer: Self-pay

## 2019-07-31 ENCOUNTER — Ambulatory Visit (INDEPENDENT_AMBULATORY_CARE_PROVIDER_SITE_OTHER): Payer: Medicare Other | Admitting: Orthopedic Surgery

## 2019-07-31 VITALS — BP 149/73 | HR 44 | Ht 71.0 in | Wt 338.0 lb

## 2019-07-31 DIAGNOSIS — M25562 Pain in left knee: Secondary | ICD-10-CM

## 2019-07-31 DIAGNOSIS — G8929 Other chronic pain: Secondary | ICD-10-CM

## 2019-07-31 DIAGNOSIS — Z6841 Body Mass Index (BMI) 40.0 and over, adult: Secondary | ICD-10-CM | POA: Diagnosis not present

## 2019-07-31 NOTE — Progress Notes (Addendum)
Lawrence Jordan  07/31/2019  Body mass index is 47.14 kg/m.   S: 68 year old male with atrial fibrillation, congestive heart failure, coronary artery disease, GERD, obstructive sleep apnea and obesity currently on tramadol Celebrex and has received an injection on April 7 of this year presents back in follow-up for osteoarthritis left knee  The x-rays show a mild varus deformity of less than 10 degrees and grade 4 degenerative changes of the medial compartment  Lawrence Jordan is still having pain in his left knee he got some relief from the injection he takes the tramadol if he had a bad day and only takes it at night  O: BP (!) 149/73   Pulse (!) 44   Ht 5\' 11"  (1.803 m)   Wt (!) 338 lb (153.3 kg)   BMI 47.14 kg/m   Physical Exam  He still has a reasonable range of motion in the left knee approximately 120 degrees slight flexion contracture joint line tenderness small effusion  MEDICAL DECISION MAKING  Encounter Diagnoses  Name Primary?  . Chronic pain of left knee Yes  . Body mass index 45.0-49.9, adult ([place])   . Morbid obesity ([place])     The patient meets the AMA guidelines for Morbid (severe) obesity with a BMI > 40.0 and I have recommended weight loss.  IMAGING: Independent interpretation of images: none   Outside records reviewed: no  MANAGEMENT   Repeat injection left knee  Procedure note left knee injection   verbal consent was obtained to inject left knee joint  Timeout was completed to confirm the site of injection  The medications used were 40 mg of Depo-Medrol and 1% lidocaine 3 cc  Anesthesia was provided by ethyl chloride and the skin was prepped with alcohol.  After cleaning the skin with alcohol a 20-gauge needle was used to inject the left knee joint. There were no complications. A sterile bandage was applied.  Continue tramadol  Consider hyaluronic acid injection  Return in 5 weeks.  Please call 1 week before the appointment to let us know if you want  to try the hyaluronic acid injection  No orders of the defined types were placed in this encounter.     Arther Abbott, MD  07/31/2019 3:00 PM

## 2019-07-31 NOTE — Patient Instructions (Signed)

## 2019-08-01 ENCOUNTER — Ambulatory Visit: Payer: Medicare Other | Admitting: Orthopedic Surgery
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
## 2019-08-14 ENCOUNTER — Telehealth: Payer: Medicare Other | Admitting: Cardiology

## 2019-08-20 ENCOUNTER — Other Ambulatory Visit: Payer: Self-pay

## 2019-08-20 MED ORDER — TRESIBA FLEXTOUCH 200 UNIT/ML ~~LOC~~ SOPN: PEN_INJECTOR | SUBCUTANEOUS | 0 refills | Status: DC

## 2019-08-24 ENCOUNTER — Other Ambulatory Visit: Payer: Self-pay | Admitting: Endocrinology

## 2019-08-24 DIAGNOSIS — Z794 Long term (current) use of insulin: Secondary | ICD-10-CM | POA: Diagnosis not present

## 2019-08-24 DIAGNOSIS — I10 Essential (primary) hypertension: Secondary | ICD-10-CM | POA: Diagnosis not present

## 2019-08-24 DIAGNOSIS — I482 Chronic atrial fibrillation, unspecified: Secondary | ICD-10-CM | POA: Diagnosis not present

## 2019-08-24 DIAGNOSIS — E1165 Type 2 diabetes mellitus with hyperglycemia: Secondary | ICD-10-CM | POA: Diagnosis not present

## 2019-08-26 ENCOUNTER — Other Ambulatory Visit: Payer: Self-pay | Admitting: Internal Medicine

## 2019-08-30 ENCOUNTER — Telehealth: Payer: Self-pay | Admitting: Radiology

## 2019-08-30 NOTE — Telephone Encounter (Signed)
Patient called, asked about proceeding with gel injection.  I called and discussed.  He does want to proceed, and wants to do Monovisc.  No auth needed, and insurance should cover.  We will use our stock/buy and bill.  FYI to you for next appt on 09/04/19.  Thanks.

## 2019-09-03 ENCOUNTER — Other Ambulatory Visit: Payer: Self-pay | Admitting: *Deleted

## 2019-09-03 MED ORDER — METOPROLOL TARTRATE 25 MG PO TABS: 12.5000 mg | ORAL_TABLET | Freq: Two times a day (BID) | ORAL | 1 refills | Status: DC

## 2019-09-04 ENCOUNTER — Ambulatory Visit (INDEPENDENT_AMBULATORY_CARE_PROVIDER_SITE_OTHER): Payer: Medicare Other | Admitting: Orthopedic Surgery

## 2019-09-04 ENCOUNTER — Other Ambulatory Visit: Payer: Self-pay

## 2019-09-04 ENCOUNTER — Encounter: Payer: Self-pay | Admitting: Orthopedic Surgery

## 2019-09-04 VITALS — Ht 71.0 in | Wt 313.0 lb

## 2019-09-04 DIAGNOSIS — G8929 Other chronic pain: Secondary | ICD-10-CM | POA: Diagnosis not present

## 2019-09-04 DIAGNOSIS — Z6841 Body Mass Index (BMI) 40.0 and over, adult: Secondary | ICD-10-CM | POA: Diagnosis not present

## 2019-09-04 DIAGNOSIS — M1712 Unilateral primary osteoarthritis, left knee: Secondary | ICD-10-CM
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
## 2019-09-04 NOTE — Patient Instructions (Signed)
Ice the knee next 2 days as needed

## 2019-09-04 NOTE — Progress Notes (Signed)
Chief Complaint  Patient presents with  . Injections    Lt knee Monovisc    Left knee quiet   monovisc  Patient gave consent surgical site confirmed Monovisc 1 shot injected left knee  No complications other than patient complained of pain in stiffness and tightness in the joint afterwards  Encounter Diagnoses  Name Primary?  . Chronic pain of left knee Yes  . Body mass index 45.0-49.9, adult (Standard)   . Morbid obesity ([place])

## 2019-09-05 ENCOUNTER — Telehealth (INDEPENDENT_AMBULATORY_CARE_PROVIDER_SITE_OTHER): Payer: Medicare Other | Admitting: Cardiology

## 2019-09-05 ENCOUNTER — Encounter (HOSPITAL_COMMUNITY): Payer: Self-pay

## 2019-09-05 ENCOUNTER — Telehealth: Payer: Self-pay | Admitting: Internal Medicine

## 2019-09-05 ENCOUNTER — Emergency Department (HOSPITAL_COMMUNITY)
Admission: EM | Admit: 2019-09-05 | Discharge: 2019-09-05 | Disposition: A | Discharge status: Home / Self Care | Payer: Medicare Other | Attending: Emergency Medicine | Admitting: Emergency Medicine

## 2019-09-05 ENCOUNTER — Other Ambulatory Visit: Payer: Self-pay | Admitting: *Deleted

## 2019-09-05 ENCOUNTER — Encounter: Payer: Self-pay | Admitting: Cardiology

## 2019-09-05 ENCOUNTER — Other Ambulatory Visit: Payer: Self-pay

## 2019-09-05 VITALS — Ht 71.0 in | Wt 308.0 lb

## 2019-09-05 DIAGNOSIS — I10 Essential (primary) hypertension: Secondary | ICD-10-CM | POA: Diagnosis not present

## 2019-09-05 DIAGNOSIS — I2510 Atherosclerotic heart disease of native coronary artery without angina pectoris: Secondary | ICD-10-CM | POA: Diagnosis not present

## 2019-09-05 DIAGNOSIS — E1110 Type 2 diabetes mellitus with ketoacidosis without coma: Secondary | ICD-10-CM | POA: Insufficient documentation

## 2019-09-05 DIAGNOSIS — R001 Bradycardia, unspecified: Secondary | ICD-10-CM | POA: Diagnosis not present

## 2019-09-05 DIAGNOSIS — R5383 Other fatigue: Secondary | ICD-10-CM | POA: Diagnosis present

## 2019-09-05 DIAGNOSIS — I499 Cardiac arrhythmia, unspecified: Secondary | ICD-10-CM | POA: Diagnosis not present

## 2019-09-05 DIAGNOSIS — I440 Atrioventricular block, first degree: Secondary | ICD-10-CM | POA: Diagnosis not present

## 2019-09-05 DIAGNOSIS — Z79899 Other long term (current) drug therapy: Secondary | ICD-10-CM | POA: Insufficient documentation

## 2019-09-05 DIAGNOSIS — I509 Heart failure, unspecified: Secondary | ICD-10-CM | POA: Diagnosis not present

## 2019-09-05 DIAGNOSIS — I110 Hypertensive heart disease with heart failure: Secondary | ICD-10-CM | POA: Insufficient documentation

## 2019-09-05 DIAGNOSIS — R42 Dizziness and giddiness: Secondary | ICD-10-CM | POA: Diagnosis not present

## 2019-09-05 DIAGNOSIS — G4733 Obstructive sleep apnea (adult) (pediatric): Secondary | ICD-10-CM

## 2019-09-05 DIAGNOSIS — Z794 Long term (current) use of insulin: Secondary | ICD-10-CM | POA: Insufficient documentation

## 2019-09-05 DIAGNOSIS — R0989 Other specified symptoms and signs involving the circulatory and respiratory systems: Secondary | ICD-10-CM | POA: Diagnosis not present

## 2019-09-05 DIAGNOSIS — I4891 Unspecified atrial fibrillation: Secondary | ICD-10-CM | POA: Diagnosis not present

## 2019-09-05 LAB — BASIC METABOLIC PANEL
Anion gap: 11 (ref 5–15)
BUN: 20 mg/dL (ref 8–23)
CO2: 25 mmol/L (ref 22–32)
Calcium: 9.5 mg/dL (ref 8.9–10.3)
Chloride: 102 mmol/L (ref 98–111)
Creatinine, Ser: 0.9 mg/dL (ref 0.61–1.24)
GFR calc Af Amer: >60 mL/min (ref >60)
GFR calc non Af Amer: >60 mL/min (ref >60)
Glucose, Bld: 82 mg/dL (ref 70–99)
Potassium: 3.8 mmol/L (ref 3.5–5.1)
Sodium: 138 mmol/L (ref 135–145)

## 2019-09-05 LAB — CBC
HCT: 41.7 % (ref 39.0–52.0)
Hemoglobin: 13.1 g/dL (ref 13.0–17.0)
MCH: 28.4 pg (ref 26.0–34.0)
MCHC: 31.4 g/dL (ref 30.0–36.0)
MCV: 90.3 fL (ref 80.0–100.0)
Platelets: 167 10*3/uL (ref 150–400)
RBC: 4.62 MIL/uL (ref 4.22–5.81)
RDW: 17 % — ABNORMAL HIGH (ref 11.5–15.5)
WBC: 5.8 10*3/uL (ref 4.0–10.5)
nRBC: 0 % (ref 0.0–0.2)

## 2019-09-05 LAB — TSH: TSH: 2.336 u[IU]/mL (ref 0.350–4.500)

## 2019-09-05 LAB — TROPONIN I (HIGH SENSITIVITY): Troponin I (High Sensitivity): 13 ng/L (ref <18)

## 2019-09-05 MED ORDER — LOSARTAN POTASSIUM-HCTZ 50-12.5 MG PO TABS: 1.0000 | ORAL_TABLET | Freq: Every day | ORAL | 1 refills | Status: DC

## 2019-09-05 NOTE — Discharge Instructions (Signed)
Stop the metoprolol for now.  Call Dr. [name] for follow-up.  If you have further episodes of feeling lightheaded or weak follow-up in the ER sooner.
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
## 2019-09-05 NOTE — Telephone Encounter (Signed)
STAT if HR is under 50 or over 120 (normal HR is 60-100 beats per minute)  1) What is your heart rate? 34  2) Do you have a log of your heart rate readings (document readings)? No   3) Do you have any other symptoms? Tiredness, sleepy, no energy

## 2019-09-05 NOTE — ED Triage Notes (Signed)
Pt presents to ED via [facility] for chest pressure started after EMS arrived. Pt with history of afib. Per EMS, Pt HR 30-50's sinus brady.

## 2019-09-05 NOTE — Telephone Encounter (Signed)
Spoke with pt who reports his current HR is 35 via pulse oximeter.  Pt reports he is very tired and just wants to go to sleep.Pt had a virtual appointment with Dr Radford Pax this am but at that point had not checked his heart rate at that point.  Pt advised to call 911 now for further evaluation and transport to ED. Pt verbalizes understanding and agrees with current plan.

## 2019-09-06 ENCOUNTER — Telehealth: Payer: Self-pay | Admitting: *Deleted

## 2019-09-06 DIAGNOSIS — G4733 Obstructive sleep apnea (adult) (pediatric): Secondary | ICD-10-CM

## 2019-09-06 NOTE — Telephone Encounter (Signed)
Order placed to [employer] via community message. 1 yr f/u made.

## 2019-09-06 NOTE — Telephone Encounter (Signed)
-----   Message from [name], MD sent at 09/05/2019 10:46 AM EDT ----- Order a chin strap .  Followup 1 year

## 2019-09-07 ENCOUNTER — Ambulatory Visit (INDEPENDENT_AMBULATORY_CARE_PROVIDER_SITE_OTHER): Payer: Medicare Other | Admitting: Physician Assistant

## 2019-09-07 ENCOUNTER — Other Ambulatory Visit: Payer: Self-pay

## 2019-09-07 VITALS — BP 145/70 | HR 42 | Ht 71.0 in | Wt 312.0 lb

## 2019-09-07 DIAGNOSIS — I4819 Other persistent atrial fibrillation: Secondary | ICD-10-CM | POA: Diagnosis not present

## 2019-09-07 DIAGNOSIS — I10 Essential (primary) hypertension: Secondary | ICD-10-CM | POA: Diagnosis not present

## 2019-09-07 DIAGNOSIS — R001 Bradycardia, unspecified: Secondary | ICD-10-CM

## 2019-09-07 NOTE — Patient Instructions (Signed)
Medication Instructions:  Your physician recommends that you continue on your current medications as directed. Please refer to the Current Medication list given to you today.  *If you need a refill on your cardiac medications before your next appointment, please call your pharmacy*   Lab Work: [place]   If you have labs (blood work) drawn today and your tests are completely normal, you will receive your results only by:  [place] (if you have MyChart) OR  A paper copy in the mail If you have any lab test that is abnormal or we need to change your treatment, we will call you to review the results.   Testing/Procedures:  NONE ORDERED  TODAY   Follow-Up: At [facility], you and your health needs are our priority.  As part of our continuing mission to provide you with exceptional heart care, we have created designated Provider Care Teams.  These Care Teams include your primary Cardiologist (physician) and Advanced Practice Providers (APPs -  Physician Assistants and Nurse Practitioners) who all work together to provide you with the care you need, when you need it.  We recommend signing up for the patient portal called "MyChart".  Sign up information is provided on this After Visit Summary.  MyChart is used to connect with patients for Virtual Visits (Telemedicine).  Patients are able to view lab/test results, encounter notes, upcoming appointments, etc.  Non-urgent messages can be sent to your provider as well.   To learn more about what you can do with MyChart, go to [email or web address].    Your next appointment:   AS SCHEDULED WITH DR [name]    The format for your next appointment:  AS SCHEDULED  WITH DR [name]     Other Instructions
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
## 2019-09-11 DIAGNOSIS — H25811 Combined forms of age-related cataract, right eye: Secondary | ICD-10-CM | POA: Diagnosis not present

## 2019-09-11 DIAGNOSIS — H401213 Low-tension glaucoma, right eye, severe stage: Secondary | ICD-10-CM | POA: Diagnosis not present

## 2019-09-11 DIAGNOSIS — H401222 Low-tension glaucoma, left eye, moderate stage: Secondary | ICD-10-CM | POA: Diagnosis not present

## 2019-09-11 DIAGNOSIS — H25812 Combined forms of age-related cataract, left eye: Secondary | ICD-10-CM | POA: Diagnosis not present

## 2019-09-19 ENCOUNTER — Ambulatory Visit (INDEPENDENT_AMBULATORY_CARE_PROVIDER_SITE_OTHER): Payer: Medicare Other | Admitting: Endocrinology

## 2019-09-19 ENCOUNTER — Other Ambulatory Visit: Payer: Self-pay

## 2019-09-19 ENCOUNTER — Encounter: Payer: Self-pay | Admitting: Endocrinology

## 2019-09-19 VITALS — BP 130/70 | HR 50 | Ht 71.0 in | Wt 308.0 lb

## 2019-09-19 DIAGNOSIS — E1165 Type 2 diabetes mellitus with hyperglycemia: Secondary | ICD-10-CM | POA: Diagnosis not present

## 2019-09-19 DIAGNOSIS — Z794 Long term (current) use of insulin: Secondary | ICD-10-CM

## 2019-09-19 LAB — POCT GLUCOSE (DEVICE FOR HOME USE): Glucose Fasting, POC: 154 mg/dL — AB (ref 70–99)
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
## 2019-09-25 DIAGNOSIS — Z794 Long term (current) use of insulin: Secondary | ICD-10-CM | POA: Diagnosis not present

## 2019-09-25 DIAGNOSIS — E1165 Type 2 diabetes mellitus with hyperglycemia: Secondary | ICD-10-CM | POA: Diagnosis not present

## 2019-09-25 DIAGNOSIS — I10 Essential (primary) hypertension: Secondary | ICD-10-CM | POA: Diagnosis not present

## 2019-09-25 DIAGNOSIS — I482 Chronic atrial fibrillation, unspecified: Secondary | ICD-10-CM | POA: Diagnosis not present

## 2019-09-28 ENCOUNTER — Ambulatory Visit (INDEPENDENT_AMBULATORY_CARE_PROVIDER_SITE_OTHER): Payer: Medicare Other | Admitting: Internal Medicine

## 2019-09-28 ENCOUNTER — Other Ambulatory Visit: Payer: Self-pay

## 2019-09-28 ENCOUNTER — Encounter: Payer: Self-pay | Admitting: Internal Medicine

## 2019-09-28 ENCOUNTER — Telehealth: Payer: Self-pay | Admitting: *Deleted

## 2019-09-28 VITALS — BP 106/62 | HR 49 | Ht 71.0 in | Wt 304.8 lb

## 2019-09-28 DIAGNOSIS — R001 Bradycardia, unspecified: Secondary | ICD-10-CM | POA: Diagnosis not present

## 2019-09-28 DIAGNOSIS — I10 Essential (primary) hypertension: Secondary | ICD-10-CM

## 2019-09-28 DIAGNOSIS — I4819 Other persistent atrial fibrillation: Secondary | ICD-10-CM

## 2019-09-28 DIAGNOSIS — I4891 Unspecified atrial fibrillation: Secondary | ICD-10-CM

## 2019-09-28 DIAGNOSIS — G4733 Obstructive sleep apnea (adult) (pediatric): Secondary | ICD-10-CM | POA: Diagnosis not present

## 2019-09-28 MED ORDER — PANTOPRAZOLE SODIUM 40 MG PO TBEC: 40.0000 mg | DELAYED_RELEASE_TABLET | Freq: Every day | ORAL | 1 refills | Status: DC

## 2019-09-28 MED ORDER — POTASSIUM CHLORIDE CRYS ER 20 MEQ PO TBCR: 20.0000 meq | EXTENDED_RELEASE_TABLET | Freq: Two times a day (BID) | ORAL | 1 refills | Status: DC

## 2019-09-28 NOTE — Patient Instructions (Addendum)
Medication Instructions:   Potassium & Protonix refilled today  Continue all current medications.  Labwork: none  Testing/Procedures:  Your physician has requested that you have an echocardiogram. Echocardiography is a painless test that uses sound waves to create images of your heart. It provides your doctor with information about the size and shape of your heart and how well your heart's chambers and valves are working. This procedure takes approximately one hour. There are no restrictions for this procedure.  Office will contact with results via phone or letter.    [facility] Ablation - [facility] / [facility] office will contact your for this.  Follow-Up: Pending   Any Other Special Instructions Will Be Listed Below (If Applicable).  If you need a refill on your cardiac medications before your next appointment, please call your pharmacy.
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
## 2019-10-05 ENCOUNTER — Encounter: Payer: Self-pay | Admitting: *Deleted

## 2019-10-05 MED ORDER — PREDNISONE 50 MG PO TABS
ORAL_TABLET | ORAL | 0 refills | Status: DC
Start: 2019-10-05 — End: 2019-11-08

## 2019-10-05 NOTE — Telephone Encounter (Signed)
Procedure scheduled along with labs and covid testing. Patient advised to call or mychart with questions as he views instruction through [facility].   Patient verbalized understanding.     Spoke to patient about CT scan medication instructions in detail, with patient viewing letter. Patient verbalized understanding of instructions.

## 2019-10-11 ENCOUNTER — Other Ambulatory Visit: Payer: Self-pay

## 2019-10-11 ENCOUNTER — Ambulatory Visit (INDEPENDENT_AMBULATORY_CARE_PROVIDER_SITE_OTHER): Payer: Medicare Other

## 2019-10-11 DIAGNOSIS — I4819 Other persistent atrial fibrillation: Secondary | ICD-10-CM | POA: Diagnosis not present

## 2019-10-11 LAB — ECHOCARDIOGRAM COMPLETE
Area-P 1/2: 2.32 cm2
Calc EF: 64.7 %
MV M vel: 4.8 m/s
MV Peak grad: 92.2 mmHg
P 1/2 time: 1124 msec
S' Lateral: 3.26 cm
Single Plane A2C EF: 72.9 %
Single Plane A4C EF: 57.4 %

## 2019-10-15 ENCOUNTER — Other Ambulatory Visit: Payer: Self-pay | Admitting: Endocrinology

## 2019-10-15 MED ORDER — TRESIBA FLEXTOUCH 200 UNIT/ML ~~LOC~~ SOPN: PEN_INJECTOR | SUBCUTANEOUS | 0 refills | Status: DC

## 2019-10-16 ENCOUNTER — Other Ambulatory Visit: Payer: Self-pay | Admitting: Endocrinology

## 2019-10-16 ENCOUNTER — Ambulatory Visit: Payer: Medicare Other | Admitting: Orthopedic Surgery

## 2019-10-17 ENCOUNTER — Ambulatory Visit (INDEPENDENT_AMBULATORY_CARE_PROVIDER_SITE_OTHER): Payer: Medicare Other | Admitting: Orthopedic Surgery

## 2019-10-17 ENCOUNTER — Ambulatory Visit: Payer: Medicare Other

## 2019-10-17 ENCOUNTER — Other Ambulatory Visit: Payer: Self-pay

## 2019-10-17 VITALS — Ht 71.0 in

## 2019-10-17 DIAGNOSIS — M654 Radial styloid tenosynovitis [de Quervain]: Secondary | ICD-10-CM | POA: Diagnosis not present

## 2019-10-17 DIAGNOSIS — M79645 Pain in left finger(s): Secondary | ICD-10-CM | POA: Diagnosis not present

## 2019-10-17 DIAGNOSIS — Z6841 Body Mass Index (BMI) 40.0 and over, adult: Secondary | ICD-10-CM

## 2019-10-17 NOTE — Patient Instructions (Addendum)
You have received an injection of steroids into the joint. 15% of patients will have increased pain within the 24 hours postinjection.   This is transient and will go away.   We recommend that you use ice packs on the injection site for 20 minutes every 2 hours and extra strength Tylenol 2 tablets every 8 as needed until the pain resolves.  If you continue to have pain after taking the Tylenol and using the ice please call the office for further instructions.   De Quervain's Tenosynovitis  De Quervain's tenosynovitis is a condition that causes inflammation of the tendon on the thumb side of the wrist. Tendons are cords of tissue that connect bones to muscles. The tendons in the hand pass through a tunnel called a sheath. A slippery layer of tissue (synovium) lets the tendons move smoothly in the sheath. With de Quervain's tenosynovitis, the sheath swells or thickens, causing friction and pain. The condition is also called de Quervain's disease and de Quervain's syndrome. It occurs most often in women who are 67-69 years old. What are the causes? The exact cause of this condition is not known. It may be associated with overuse of the hand and wrist. What increases the risk? You are more likely to develop this condition if you:  Use your hands far more than normal, especially if you repeat certain movements that involve twisting your hand or using a tight grip.  Are pregnant.  Are a middle-aged woman.  Have rheumatoid arthritis.  Have diabetes. What are the signs or symptoms? The main symptom of this condition is pain on the thumb side of the wrist. The pain may get worse when you grasp something or turn your wrist. Other symptoms may include:  Pain that extends up the forearm.  Swelling of your wrist and hand.  Trouble moving the thumb and wrist.  A sensation of snapping in the wrist.  A bump filled with fluid (cyst) in the area of the pain. How is this diagnosed? This  condition may be diagnosed based on:  Your symptoms and medical history.  A physical exam. During the exam, your health care provider may do a simple test Wynn Maudlin test) that involves pulling your thumb and wrist to see if this causes pain. You may also need to have an X-ray. How is this treated? Treatment for this condition may include:  Avoiding any activity that causes pain and swelling.  Taking medicines. Anti-inflammatory medicines and corticosteroid injections may be used to reduce inflammation and relieve pain.  Wearing a splint.  Having surgery. This may be needed if other treatments do not work. Once the pain and swelling has gone down:  Physical therapy. This includes stretching and strengthening exercises.  Occupational therapy. This includes adjusting how you move your wrist. Follow these instructions at home: If you have a splint:  Wear the splint as told by your health care provider. Remove it only as told by your health care provider.  Loosen the splint if your fingers tingle, become numb, or turn cold and blue.  Keep the splint clean.  If the splint is not waterproof: ? Do not let it get wet. ? Cover it with a watertight covering when you take a bath or a shower. Managing pain, stiffness, and swelling   Avoid movements and activities that cause pain and swelling in the wrist area.  If directed, put ice on the painful area. This may be helpful after doing activities that involve the sore wrist. ?  Put ice in a plastic bag. ? Place a towel between your skin and the bag. ? Leave the ice on for 20 minutes, 2-3 times a day.  Move your fingers often to avoid stiffness and to lessen swelling.  Raise (elevate) the injured area above the level of your heart while you are sitting or lying down. General instructions  Return to your normal activities as told by your health care provider. Ask your health care provider what activities are safe for you.  Take  over-the-counter and prescription medicines only as told by your health care provider.  Keep all follow-up visits as told by your health care provider. This is important. Contact a health care provider if:  Your pain medicine does not help.  Your pain gets worse.  You develop new symptoms. Summary  De Quervain's tenosynovitis is a condition that causes inflammation of the tendon on the thumb side of the wrist.  The condition occurs most often in women who are 37-63 years old.  The exact cause of this condition is not known. It may be associated with overuse of the hand and wrist.  Treatment starts with avoiding activity that causes pain or swelling in the wrist area. Other treatment may include wearing a splint and taking medicine. Sometimes, surgery is needed. This information is not intended to replace advice given to you by your health care provider. Make sure you discuss any questions you have with your health care provider. Document Revised: 07/14/2017 Document Reviewed: 12/20/2016 Elsevier Patient Education  2020 [employer].
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
## 2019-10-18 ENCOUNTER — Other Ambulatory Visit: Payer: Medicare Other | Admitting: *Deleted

## 2019-10-18 DIAGNOSIS — I4891 Unspecified atrial fibrillation: Secondary | ICD-10-CM

## 2019-10-18 LAB — BASIC METABOLIC PANEL
BUN/Creatinine Ratio: 19 (ref 10–24)
BUN: 16 mg/dL (ref 8–27)
CO2: 22 mmol/L (ref 20–29)
Calcium: 10 mg/dL (ref 8.6–10.2)
Chloride: 103 mmol/L (ref 96–106)
Creatinine, Ser: 0.83 mg/dL (ref 0.76–1.27)
GFR calc Af Amer: 105 mL/min/{1.73_m2} (ref >59)
GFR calc non Af Amer: 90 mL/min/{1.73_m2} (ref >59)
Glucose: 81 mg/dL (ref 65–99)
Potassium: 4.3 mmol/L (ref 3.5–5.2)
Sodium: 140 mmol/L (ref 134–144)

## 2019-10-18 LAB — CBC WITH DIFFERENTIAL/PLATELET
Basophils Absolute: 0 10*3/uL (ref 0.0–0.2)
Basos: 1 %
EOS (ABSOLUTE): 0.1 10*3/uL (ref 0.0–0.4)
Eos: 2 %
Hematocrit: 42.7 % (ref 37.5–51.0)
Hemoglobin: 13.9 g/dL (ref 13.0–17.7)
Immature Grans (Abs): 0 10*3/uL (ref 0.0–0.1)
Immature Granulocytes: 0 %
Lymphocytes Absolute: 1.8 10*3/uL (ref 0.7–3.1)
Lymphs: 30 %
MCH: 28.5 pg (ref 26.6–33.0)
MCHC: 32.6 g/dL (ref 31.5–35.7)
MCV: 88 fL (ref 79–97)
Monocytes Absolute: 0.5 10*3/uL (ref 0.1–0.9)
Monocytes: 8 %
Neutrophils Absolute: 3.5 10*3/uL (ref 1.4–7.0)
Neutrophils: 59 %
Platelets: 178 10*3/uL (ref 150–450)
RBC: 4.87 x10E6/uL (ref 4.14–5.80)
RDW: 15.6 % — ABNORMAL HIGH (ref 11.6–15.4)
WBC: 6 10*3/uL (ref 3.4–10.8)

## 2019-10-25 DIAGNOSIS — Z794 Long term (current) use of insulin: Secondary | ICD-10-CM | POA: Diagnosis not present

## 2019-10-25 DIAGNOSIS — I482 Chronic atrial fibrillation, unspecified: Secondary | ICD-10-CM | POA: Diagnosis not present

## 2019-10-25 DIAGNOSIS — E1165 Type 2 diabetes mellitus with hyperglycemia: Secondary | ICD-10-CM | POA: Diagnosis not present

## 2019-10-25 DIAGNOSIS — I10 Essential (primary) hypertension: Secondary | ICD-10-CM | POA: Diagnosis not present

## 2019-10-26 ENCOUNTER — Ambulatory Visit: Payer: Medicare Other | Admitting: Internal Medicine

## 2019-11-01 ENCOUNTER — Telehealth (HOSPITAL_COMMUNITY): Payer: Self-pay | Admitting: Emergency Medicine

## 2019-11-01 DIAGNOSIS — Z20828 Contact with and (suspected) exposure to other viral communicable diseases: Secondary | ICD-10-CM | POA: Diagnosis not present

## 2019-11-01 DIAGNOSIS — J019 Acute sinusitis, unspecified: Secondary | ICD-10-CM | POA: Diagnosis not present

## 2019-11-01 DIAGNOSIS — E1165 Type 2 diabetes mellitus with hyperglycemia: Secondary | ICD-10-CM | POA: Diagnosis not present

## 2019-11-01 DIAGNOSIS — I10 Essential (primary) hypertension: Secondary | ICD-10-CM | POA: Diagnosis not present

## 2019-11-01 NOTE — Telephone Encounter (Signed)
Reaching out to patient to offer assistance regarding upcoming cardiac imaging study; pt verbalizes understanding of appt date/time, parking situation and where to check in, pre-test NPO status and medications ordered, and verified current allergies; name and call back number provided for further questions should they arise Marchia Bond RN Navigator Cardiac Imaging [facility] [phone] office [phone] cell   Pt asked to hold diuretics (lasix and lisinopril-HCTZ) and instructed to take prednisone 12:30a, 6:30a, and 12:30p w/ benadryl, reminded him that he must have a ride [place]

## 2019-11-02 ENCOUNTER — Other Ambulatory Visit: Payer: Self-pay

## 2019-11-02 ENCOUNTER — Ambulatory Visit (HOSPITAL_COMMUNITY)
Admission: RE | Admit: 2019-11-02 | Discharge: 2019-11-02 | Disposition: A | Discharge status: Home / Self Care | Payer: Medicare Other | Source: Ambulatory Visit | Attending: Internal Medicine | Admitting: Internal Medicine

## 2019-11-02 DIAGNOSIS — I4891 Unspecified atrial fibrillation: Secondary | ICD-10-CM | POA: Diagnosis not present

## 2019-11-02 MED ORDER — IOHEXOL 350 MG/ML SOLN
80.0000 mL | Freq: Once | INTRAVENOUS | Status: AC | PRN
  Administered 2019-11-02: 80 mL via INTRAVENOUS

## 2019-11-06 ENCOUNTER — Other Ambulatory Visit (HOSPITAL_COMMUNITY)
Admission: RE | Admit: 2019-11-06 | Discharge: 2019-11-06 | Disposition: A | Discharge status: Home / Self Care | Payer: Medicare Other | Source: Ambulatory Visit | Attending: Internal Medicine | Admitting: Internal Medicine

## 2019-11-06 DIAGNOSIS — Z20822 Contact with and (suspected) exposure to covid-19: Secondary | ICD-10-CM | POA: Insufficient documentation

## 2019-11-06 DIAGNOSIS — Z01812 Encounter for preprocedural laboratory examination: Secondary | ICD-10-CM | POA: Insufficient documentation

## 2019-11-06 LAB — SARS CORONAVIRUS 2 (TAT 6-24 HRS): SARS Coronavirus 2: NEGATIVE

## 2019-11-07 NOTE — Progress Notes (Signed)
Instructed patient on the following items: Arrival time 0830 Nothing to eat or drink after midnight No meds AM of procedure Responsible person to drive you home and stay with you for 24 hrs  Have you missed any doses of anti-coagulant Eliquis- hasn't missed any doses   

## 2019-11-08 ENCOUNTER — Ambulatory Visit (HOSPITAL_COMMUNITY)
Admission: RE | Admit: 2019-11-08 | Discharge: 2019-11-08 | Disposition: A | Discharge status: Home / Self Care | Payer: Medicare Other | Attending: Internal Medicine | Admitting: Internal Medicine

## 2019-11-08 ENCOUNTER — Ambulatory Visit (HOSPITAL_COMMUNITY): Payer: Medicare Other | Admitting: Anesthesiology

## 2019-11-08 ENCOUNTER — Other Ambulatory Visit: Payer: Self-pay

## 2019-11-08 ENCOUNTER — Encounter (HOSPITAL_COMMUNITY)
Admission: RE | Disposition: A | Discharge status: Home / Self Care | Payer: Medicare Other | Source: Home / Self Care | Attending: Internal Medicine

## 2019-11-08 DIAGNOSIS — Z79899 Other long term (current) drug therapy: Secondary | ICD-10-CM | POA: Diagnosis not present

## 2019-11-08 DIAGNOSIS — E7800 Pure hypercholesterolemia, unspecified: Secondary | ICD-10-CM | POA: Insufficient documentation

## 2019-11-08 DIAGNOSIS — E119 Type 2 diabetes mellitus without complications: Secondary | ICD-10-CM | POA: Diagnosis not present

## 2019-11-08 DIAGNOSIS — I4819 Other persistent atrial fibrillation: Secondary | ICD-10-CM | POA: Insufficient documentation

## 2019-11-08 DIAGNOSIS — Z7901 Long term (current) use of anticoagulants: Secondary | ICD-10-CM | POA: Insufficient documentation

## 2019-11-08 DIAGNOSIS — G4733 Obstructive sleep apnea (adult) (pediatric): Secondary | ICD-10-CM | POA: Insufficient documentation

## 2019-11-08 DIAGNOSIS — J9601 Acute respiratory failure with hypoxia: Secondary | ICD-10-CM | POA: Diagnosis not present

## 2019-11-08 DIAGNOSIS — K219 Gastro-esophageal reflux disease without esophagitis: Secondary | ICD-10-CM | POA: Diagnosis not present

## 2019-11-08 DIAGNOSIS — Z791 Long term (current) use of non-steroidal anti-inflammatories (NSAID): Secondary | ICD-10-CM | POA: Diagnosis not present

## 2019-11-08 DIAGNOSIS — I110 Hypertensive heart disease with heart failure: Secondary | ICD-10-CM | POA: Insufficient documentation

## 2019-11-08 DIAGNOSIS — Z794 Long term (current) use of insulin: Secondary | ICD-10-CM | POA: Insufficient documentation

## 2019-11-08 DIAGNOSIS — Z6841 Body Mass Index (BMI) 40.0 and over, adult: Secondary | ICD-10-CM | POA: Insufficient documentation

## 2019-11-08 DIAGNOSIS — I509 Heart failure, unspecified: Secondary | ICD-10-CM | POA: Insufficient documentation

## 2019-11-08 DIAGNOSIS — Z85828 Personal history of other malignant neoplasm of skin: Secondary | ICD-10-CM | POA: Insufficient documentation

## 2019-11-08 DIAGNOSIS — I2510 Atherosclerotic heart disease of native coronary artery without angina pectoris: Secondary | ICD-10-CM | POA: Insufficient documentation

## 2019-11-08 DIAGNOSIS — I471 Supraventricular tachycardia: Secondary | ICD-10-CM | POA: Diagnosis not present

## 2019-11-08 HISTORY — PX: ATRIAL FIBRILLATION ABLATION: EP1191

## 2019-11-08 LAB — POCT ACTIVATED CLOTTING TIME
Activated Clotting Time: 219 seconds
Activated Clotting Time: 219 seconds

## 2019-11-08 LAB — GLUCOSE, CAPILLARY: Glucose-Capillary: 108 mg/dL — ABNORMAL HIGH (ref 70–99)

## 2019-11-08 SURGERY — ATRIAL FIBRILLATION ABLATION
Anesthesia: General

## 2019-11-08 MED ORDER — PROMETHAZINE HCL 25 MG/ML IJ SOLN: 6.2500 mg | INTRAMUSCULAR | Status: DC | PRN

## 2019-11-08 MED ORDER — SUGAMMADEX SODIUM 200 MG/2ML IV SOLN
INTRAVENOUS | Status: DC | PRN
  Administered 2019-11-08 (×2): 100 mg via INTRAVENOUS

## 2019-11-08 MED ORDER — ONDANSETRON HCL 4 MG/2ML IJ SOLN
INTRAMUSCULAR | Status: DC | PRN
  Administered 2019-11-08: 4 mg via INTRAVENOUS

## 2019-11-08 MED ORDER — SODIUM CHLORIDE 0.9 % IV SOLN: INTRAVENOUS | Status: DC

## 2019-11-08 MED ORDER — MIDAZOLAM HCL 2 MG/2ML IJ SOLN
INTRAMUSCULAR | Status: DC | PRN
  Administered 2019-11-08: 2 mg via INTRAVENOUS

## 2019-11-08 MED ORDER — HEPARIN SODIUM (PORCINE) 1000 UNIT/ML IJ SOLN
INTRAMUSCULAR | Status: DC | PRN
  Administered 2019-11-08: 14000 [IU] via INTRAVENOUS
  Administered 2019-11-08: 1000 [IU] via INTRAVENOUS

## 2019-11-08 MED ORDER — PHENYLEPHRINE HCL-NACL 10-0.9 MG/250ML-% IV SOLN
INTRAVENOUS | Status: DC | PRN
  Administered 2019-11-08: 25 ug/min via INTRAVENOUS

## 2019-11-08 MED ORDER — HEPARIN SODIUM (PORCINE) 1000 UNIT/ML IJ SOLN
INTRAMUSCULAR | Status: AC
  Filled 2019-11-08: qty 2

## 2019-11-08 MED ORDER — SODIUM CHLORIDE 0.9% FLUSH: 3.0000 mL | Freq: Two times a day (BID) | INTRAVENOUS | Status: DC

## 2019-11-08 MED ORDER — SODIUM CHLORIDE 0.9% FLUSH: 3.0000 mL | INTRAVENOUS | Status: DC | PRN

## 2019-11-08 MED ORDER — PROTAMINE SULFATE 10 MG/ML IV SOLN
INTRAVENOUS | Status: DC | PRN
  Administered 2019-11-08: 40 mg via INTRAVENOUS

## 2019-11-08 MED ORDER — EPHEDRINE SULFATE-NACL 50-0.9 MG/10ML-% IV SOSY: PREFILLED_SYRINGE | INTRAVENOUS | Status: DC | PRN

## 2019-11-08 MED ORDER — HEPARIN SODIUM (PORCINE) 1000 UNIT/ML IJ SOLN
INTRAMUSCULAR | Status: DC | PRN
  Administered 2019-11-08 (×2): 5000 [IU] via INTRAVENOUS

## 2019-11-08 MED ORDER — PROPOFOL 10 MG/ML IV BOLUS
INTRAVENOUS | Status: DC | PRN
  Administered 2019-11-08: 50 mg via INTRAVENOUS
  Administered 2019-11-08: 200 mg via INTRAVENOUS
  Administered 2019-11-08: 20 mg via INTRAVENOUS

## 2019-11-08 MED ORDER — HEPARIN (PORCINE) IN NACL 1000-0.9 UT/500ML-% IV SOLN
INTRAVENOUS | Status: AC
  Filled 2019-11-08: qty 500

## 2019-11-08 MED ORDER — ISOPROTERENOL HCL 0.2 MG/ML IJ SOLN
INTRAVENOUS | Status: DC | PRN
  Administered 2019-11-08: 20 ug/min via INTRAVENOUS

## 2019-11-08 MED ORDER — FENTANYL CITRATE (PF) 250 MCG/5ML IJ SOLN
INTRAMUSCULAR | Status: DC | PRN
Start: 2019-11-08 — End: 2019-11-08
  Administered 2019-11-08: 25 ug via INTRAVENOUS
  Administered 2019-11-08: 100 ug via INTRAVENOUS

## 2019-11-08 MED ORDER — ACETAMINOPHEN 325 MG PO TABS: 650.0000 mg | ORAL_TABLET | ORAL | Status: DC | PRN

## 2019-11-08 MED ORDER — DEXAMETHASONE SODIUM PHOSPHATE 10 MG/ML IJ SOLN
INTRAMUSCULAR | Status: DC | PRN
  Administered 2019-11-08: 5 mg via INTRAVENOUS

## 2019-11-08 MED ORDER — ONDANSETRON HCL 4 MG/2ML IJ SOLN: 4.0000 mg | Freq: Four times a day (QID) | INTRAMUSCULAR | Status: DC | PRN

## 2019-11-08 MED ORDER — ROCURONIUM BROMIDE 10 MG/ML (PF) SYRINGE
PREFILLED_SYRINGE | INTRAVENOUS | Status: DC | PRN
  Administered 2019-11-08: 30 mg via INTRAVENOUS
  Administered 2019-11-08: 20 mg via INTRAVENOUS
  Administered 2019-11-08: 30 mg via INTRAVENOUS
  Administered 2019-11-08: 70 mg via INTRAVENOUS

## 2019-11-08 MED ORDER — HEPARIN (PORCINE) IN NACL 1000-0.9 UT/500ML-% IV SOLN
INTRAVENOUS | Status: DC | PRN
  Administered 2019-11-08: 500 mL

## 2019-11-08 MED ORDER — HEPARIN SODIUM (PORCINE) 1000 UNIT/ML IJ SOLN
INTRAMUSCULAR | Status: AC
  Filled 2019-11-08: qty 1

## 2019-11-08 MED ORDER — LIDOCAINE 2% (20 MG/ML) 5 ML SYRINGE
INTRAMUSCULAR | Status: DC | PRN
  Administered 2019-11-08: 20 mg via INTRAVENOUS

## 2019-11-08 MED ORDER — ISOPROTERENOL HCL 0.2 MG/ML IJ SOLN
INTRAMUSCULAR | Status: AC
  Filled 2019-11-08: qty 5

## 2019-11-08 MED ORDER — HYDROCODONE-ACETAMINOPHEN 5-325 MG PO TABS: 1.0000 | ORAL_TABLET | ORAL | Status: DC | PRN

## 2019-11-08 MED ORDER — EPHEDRINE SULFATE-NACL 50-0.9 MG/10ML-% IV SOSY
PREFILLED_SYRINGE | INTRAVENOUS | Status: DC | PRN
  Administered 2019-11-08: 5 mg via INTRAVENOUS
  Administered 2019-11-08: 10 mg via INTRAVENOUS

## 2019-11-08 MED ORDER — APIXABAN 5 MG PO TABS
5.0000 mg | ORAL_TABLET | Freq: Once | ORAL | Status: AC
  Administered 2019-11-08: 5 mg via ORAL
  Filled 2019-11-08: qty 1

## 2019-11-08 MED ORDER — SODIUM CHLORIDE 0.9 % IV SOLN: 250.0000 mL | INTRAVENOUS | Status: DC | PRN

## 2019-11-08 MED ORDER — FENTANYL CITRATE (PF) 100 MCG/2ML IJ SOLN: 25.0000 ug | INTRAMUSCULAR | Status: DC | PRN

## 2019-11-08 SURGICAL SUPPLY — 23 items
BLANKET WARM UNDERBOD FULL ACC (MISCELLANEOUS) ×2 IMPLANT
CATH MAPPNG PENTARAY F 2-6-2MM (CATHETERS) ×1 IMPLANT
CATH SMTCH THERMOCOOL SF DF (CATHETERS) ×2 IMPLANT
CATH SOUNDSTAR ECO 8FR (CATHETERS) ×2 IMPLANT
CATH WEBSTER BI DIR CS D-F CRV (CATHETERS) ×2 IMPLANT
CLOSURE PERCLOSE PROSTYLE (VASCULAR PRODUCTS) ×6 IMPLANT
COVER SWIFTLINK CONNECTOR (BAG) ×2 IMPLANT
INTRODUCER SWARTZ SL2 8F (SHEATH) ×2 IMPLANT
NEEDLE BAYLIS TRANSSEPTAL 71CM (NEEDLE) ×2 IMPLANT
PACK EP LATEX FREE (CUSTOM PROCEDURE TRAY) ×2
PACK EP LF (CUSTOM PROCEDURE TRAY) ×1 IMPLANT
PAD PRO RADIOLUCENT 2001M-C (PAD) ×2 IMPLANT
PATCH CARTO3 (PAD) ×2 IMPLANT
PENTARAY F 2-6-2MM (CATHETERS) ×2
SHEATH BAYLIS SUREFLEX  M 8.5 (SHEATH) ×2
SHEATH BAYLIS SUREFLEX M 8.5 (SHEATH) ×1 IMPLANT
SHEATH BAYLIS TORFLEX (SHEATH) ×2 IMPLANT
SHEATH BAYLIS TRANSSEPTAL 98CM (NEEDLE) ×2 IMPLANT
SHEATH CARTO VIZIGO SM CVD (SHEATH) ×2 IMPLANT
SHEATH PINNACLE 7F 10CM (SHEATH) ×6 IMPLANT
SHEATH PINNACLE 9F 10CM (SHEATH) ×2 IMPLANT
SHEATH PROBE COVER 6X72 (BAG) ×2 IMPLANT
TUBING SMART ABLATE COOLFLOW (TUBING) ×2 IMPLANT

## 2019-11-08 NOTE — Progress Notes (Signed)
Patient was given discharge instructions. He verbalized understanding. 

## 2019-11-08 NOTE — Anesthesia Preprocedure Evaluation (Signed)
Anesthesia Evaluation  Patient identified by MRN, date of birth, ID band Patient awake    Reviewed: Allergy & Precautions, NPO status , Patient's Chart, lab work & pertinent test results  History of Anesthesia Complications Negative for: history of anesthetic complications  Airway Mallampati: II  TM Distance: >3 FB Neck ROM: Full    Dental  (+) Teeth Intact   Pulmonary sleep apnea and Continuous Positive Airway Pressure Ventilation ,    Pulmonary exam normal breath sounds clear to auscultation       Cardiovascular hypertension, Pt. on home beta blockers and Pt. on medications + CAD  + dysrhythmias Atrial Fibrillation  Rhythm:Irregular Rate:Normal  EKG - A-flutter  '19 TTE - Mild concentric LVH. EF was in the range of 65% to 70%. Mild AI. Trivial MR. The LAA was severely dilated. Mild TR. Normal PASP.  '19 Nuc Stress - Nuclear stress EF: 61%. The study is normal. There was no ST segment deviation noted during stress.  '12 Cath (results found in Dr. Jackalyn Lombard note) - 30-40% ostial left main stenosis (seemed worse in certain views but FFR was only 0.95, IVUS was fine also), LAD: 20-30% disease, RCA: 40% proximal   IMPRESSIONS    1. Left ventricular ejection fraction, by estimation, is 60 to 65%. The  left ventricle has normal function. The left ventricle has no regional  wall motion abnormalities. The left ventricular internal cavity size was  mildly dilated. There is mild left  ventricular hypertrophy. Left ventricular diastolic parameters were  normal.  2. Right ventricular systolic function is normal. The right ventricular  size is normal. There is mildly elevated pulmonary artery systolic  pressure. The estimated right ventricular systolic pressure is 32.3 mmHg.  3. The mitral valve is grossly normal. Mild mitral valve regurgitation.  4. The aortic valve is tricuspid. Aortic valve regurgitation is mild.  Aortic  regurgitation PHT measures 1124 msec.  5. Aortic dilatation noted. There is mild dilatation of the aortic root,  measuring 40 mm.  6. The inferior vena cava is normal in size with greater than 50%  respiratory variability, suggesting right atrial pressure of 3 mmHg.    Neuro/Psych negative neurological ROS  negative psych ROS   GI/Hepatic Neg liver ROS, GERD  Controlled and Medicated,  Endo/Other  diabetes, Type 2, Insulin DependentMorbid obesity  Renal/GU negative Renal ROS  negative genitourinary   Musculoskeletal  (+) Arthritis ,   Abdominal   Peds  Hematology negative hematology ROS (+)   Anesthesia Other Findings   Reproductive/Obstetrics                             Anesthesia Physical  Anesthesia Plan  ASA: III  Anesthesia Plan: General   Post-op Pain Management:    Induction: Intravenous  PONV Risk Score and Plan: 2 and Treatment may vary due to age or medical condition, Ondansetron and Dexamethasone  Airway Management Planned: Oral ETT  Additional Equipment: None  Intra-op Plan:   Post-operative Plan:   Informed Consent: I have reviewed the patients History and Physical, chart, labs and discussed the procedure including the risks, benefits and alternatives for the proposed anesthesia with the patient or authorized representative who has indicated his/her understanding and acceptance.     Dental advisory given  Plan Discussed with: CRNA and Anesthesiologist  Anesthesia Plan Comments:         Anesthesia Quick Evaluation

## 2019-11-08 NOTE — Discharge Instructions (Signed)
Post procedure care instructions No driving for 4 days. No lifting over 5 lbs for 1 week. No vigorous or sexual activity for 1 week. You may return to work/your usual activities on 11/15/2019. Keep procedure site clean & dry. If you notice increased pain, swelling, bleeding or pus, call/return!  You may shower, but no soaking baths/hot tubs/pools for 1 week.    You have an appointment set up with the [facility].  Multiple studies have shown that being followed by a dedicated atrial fibrillation clinic in addition to the standard care you receive from your other physicians improves health. We believe that enrollment in the atrial fibrillation clinic will allow [place] to better care for you.   The phone number to the [facility] is [phone]. The clinic is staffed Monday through Friday from 8:30am to 5pm.  Parking Directions: The clinic is located in the Heart and Vascular Building connected to [facility]. 1)From [address] turn on to [address] and go to the 3rd entrance  (Heart and Vascular entrance) on the right. 2)Look to the right for Heart &Vascular Parking Garage. 3)A code for the entrance is required, for November is 3008.   4)Take the elevators to the 1st floor. Registration is in the room with the glass walls at the end of the hallway.  If you have any trouble parking or locating the clinic, please don't hesitate to call [phone].    Femoral Site Care This sheet gives you information about how to care for yourself after your procedure. Your health care provider may also give you more specific instructions. If you have problems or questions, contact your health care provider. What can I expect after the procedure? After the procedure, it is common to have:  Bruising that usually fades within 1-2 weeks.  Tenderness at the site. Follow these instructions at home: Wound care  Follow instructions from your health care provider about  how to take care of your insertion site. Make sure you: ? Wash your hands with soap and water before you change your bandage (dressing). If soap and water are not available, use hand sanitizer. ? Change your dressing as told by your health care provider. ? Leave stitches (sutures), skin glue, or adhesive strips in place. These skin closures may need to stay in place for 2 weeks or longer. If adhesive strip edges start to loosen and curl up, you may trim the loose edges. Do not remove adhesive strips completely unless your health care provider tells you to do that.  Do not take baths, swim, or use a hot tub until your health care provider approves.  You may shower 24-48 hours after the procedure or as told by your health care provider. ? Gently wash the site with plain soap and water. ? Pat the area dry with a clean towel. ? Do not rub the site. This may cause bleeding.  Do not apply powder or lotion to the site. Keep the site clean and dry.  Check your femoral site every day for signs of infection. Check for: ? Redness, swelling, or pain. ? Fluid or blood. ? Warmth. ? Pus or a bad smell. Activity  For the first 2-3 days after your procedure, or as long as directed: ? Avoid climbing stairs as much as possible. ? Do not squat.  Do not lift anything that is heavier than 10 lb (4.5 kg), or the limit that you are told, until your health care provider says that it is  safe.  Rest as directed. ? Avoid sitting for a long time without moving. Get up to take short walks every 1-2 hours.  Do not drive for 24 hours if you were given a medicine to help you relax (sedative). General instructions  Take over-the-counter and prescription medicines only as told by your health care provider.  Keep all follow-up visits as told by your health care provider. This is important. Contact a health care provider if you have:  A fever or chills.  You have redness, swelling, or pain around your insertion  site. Get help right away if:  The catheter insertion area swells very fast.  You pass out.  You suddenly start to sweat or your skin gets clammy.  The catheter insertion area is bleeding, and the bleeding does not stop when you hold steady pressure on the area.  The area near or just beyond the catheter insertion site becomes pale, cool, tingly, or numb. These symptoms may represent a serious problem that is an emergency. Do not wait to see if the symptoms will go away. Get medical help right away. Call your local emergency services (911 in the [place] Do not drive yourself to the hospital. Summary  After the procedure, it is common to have bruising that usually fades within 1-2 weeks.  Check your femoral site every day for signs of infection.  Do not lift anything that is heavier than 10 lb (4.5 kg), or the limit that you are told, until your health care provider says that it is safe. This information is not intended to replace advice given to you by your health care provider. Make sure you discuss any questions you have with your health care provider. Document Revised: 01/24/2017 Document Reviewed: 01/24/2017 Elsevier Patient Education  2020 [employer].
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
## 2019-11-08 NOTE — Anesthesia Procedure Notes (Addendum)
Procedure Name: Intubation Date/Time: 11/08/2019 10:51 AM Performed by: Rande Brunt, CRNA Pre-anesthesia Checklist: Patient identified, Emergency Drugs available, Suction available and Patient being monitored Patient Re-evaluated:Patient Re-evaluated prior to induction Oxygen Delivery Method: Circle System Utilized Preoxygenation: Pre-oxygenation with 100% oxygen Induction Type: IV induction Ventilation: Mask ventilation without difficulty Laryngoscope Size: Glidescope and 4 Grade View: Grade I Tube type: Oral Tube size: 7.5 mm Number of attempts: 1 Airway Equipment and Method: Stylet,  Oral airway and Video-laryngoscopy Placement Confirmation: ETT inserted through vocal cords under direct vision,  positive ETCO2 and breath sounds checked- equal and bilateral Secured at: 21 cm Tube secured with: Tape Dental Injury: Teeth and Oropharynx as per pre-operative assessment

## 2019-11-08 NOTE — Transfer of Care (Signed)
Immediate Anesthesia Transfer of Care Note  Patient: Lawrence Jordan  Procedure(s) Performed: ATRIAL FIBRILLATION ABLATION (N/A )  Patient Location: Cath Lab  Anesthesia Type:General  Level of Consciousness: awake, alert , oriented and drowsy  Airway & Oxygen Therapy: Patient Spontanous Breathing and Patient connected to nasal cannula oxygen  Post-op Assessment: Report given to RN, Post -op Vital signs reviewed and stable and Patient moving all extremities  Post vital signs: Reviewed and stable  Last Vitals:  Vitals Value Taken Time  BP    Temp    Pulse 64 11/08/19 1336  Resp 15 11/08/19 1336  SpO2 95 % 11/08/19 1336  Vitals shown include unvalidated device data.  Last Pain:  Vitals:   11/08/19 0922  TempSrc: Oral  PainSc: 0-No pain         Complications: No complications documented.

## 2019-11-09 ENCOUNTER — Other Ambulatory Visit: Payer: Self-pay | Admitting: Internal Medicine

## 2019-11-09 ENCOUNTER — Encounter (HOSPITAL_COMMUNITY): Payer: Self-pay | Admitting: Internal Medicine

## 2019-11-11 NOTE — Anesthesia Postprocedure Evaluation (Signed)
Anesthesia Post Note  Patient: Lawrence Jordan  Procedure(s) Performed: ATRIAL FIBRILLATION ABLATION (N/A )     Patient location during evaluation: Cath Lab Anesthesia Type: General Level of consciousness: sedated Pain management: pain level controlled Vital Signs Assessment: post-procedure vital signs reviewed and stable Respiratory status: spontaneous breathing and respiratory function stable Cardiovascular status: stable Postop Assessment: no apparent nausea or vomiting Anesthetic complications: no   No complications documented.  Last Vitals:  Vitals:   11/08/19 1600 11/08/19 1630  BP: (!) 130/56 122/63  Pulse: (!) 56 61  Resp: 14 17  Temp:    SpO2: 94% 95%    Last Pain:  Vitals:   11/09/19 1026  TempSrc:   PainSc: 0-No pain                 [name]

## 2019-11-15 ENCOUNTER — Other Ambulatory Visit: Payer: Self-pay | Admitting: *Deleted

## 2019-11-15 MED ORDER — TRESIBA FLEXTOUCH 200 UNIT/ML ~~LOC~~ SOPN: 60.0000 [IU] | PEN_INJECTOR | Freq: Every day | SUBCUTANEOUS | 3 refills | Status: DC

## 2019-11-24 ENCOUNTER — Other Ambulatory Visit: Payer: Self-pay | Admitting: Internal Medicine

## 2019-11-24 DIAGNOSIS — I10 Essential (primary) hypertension: Secondary | ICD-10-CM | POA: Diagnosis not present

## 2019-11-24 DIAGNOSIS — E1165 Type 2 diabetes mellitus with hyperglycemia: Secondary | ICD-10-CM | POA: Diagnosis not present

## 2019-11-24 DIAGNOSIS — Z794 Long term (current) use of insulin: Secondary | ICD-10-CM | POA: Diagnosis not present

## 2019-11-24 DIAGNOSIS — I482 Chronic atrial fibrillation, unspecified: Secondary | ICD-10-CM | POA: Diagnosis not present

## 2019-11-25 ENCOUNTER — Other Ambulatory Visit: Payer: Self-pay | Admitting: Internal Medicine

## 2019-11-25 DIAGNOSIS — I2510 Atherosclerotic heart disease of native coronary artery without angina pectoris: Secondary | ICD-10-CM

## 2019-11-25 DIAGNOSIS — I4819 Other persistent atrial fibrillation: Secondary | ICD-10-CM

## 2019-11-26 NOTE — Telephone Encounter (Signed)
Prescription refill request for Eliquis received. Indication: a fib Last office visit: 09/28/19 Scr: 0.83 Age: 69 Weight: 138kg

## 2019-11-28 ENCOUNTER — Other Ambulatory Visit: Payer: Self-pay

## 2019-11-28 ENCOUNTER — Ambulatory Visit (INDEPENDENT_AMBULATORY_CARE_PROVIDER_SITE_OTHER): Payer: Medicare Other | Admitting: Orthopedic Surgery

## 2019-11-28 VITALS — BP 166/82 | HR 51 | Ht 71.0 in | Wt 290.0 lb

## 2019-11-28 DIAGNOSIS — M25562 Pain in left knee: Secondary | ICD-10-CM

## 2019-11-28 DIAGNOSIS — Z6841 Body Mass Index (BMI) 40.0 and over, adult: Secondary | ICD-10-CM | POA: Diagnosis not present

## 2019-11-28 DIAGNOSIS — I2510 Atherosclerotic heart disease of native coronary artery without angina pectoris: Secondary | ICD-10-CM | POA: Diagnosis not present

## 2019-11-28 DIAGNOSIS — G8929 Other chronic pain: Secondary | ICD-10-CM | POA: Diagnosis not present

## 2019-11-28 NOTE — Progress Notes (Signed)
Chief Complaint  Patient presents with  . Knee Pain    left injection Monovisc was helpful     Encounter Diagnoses  Name Primary?  . Body mass index 40.0-44.9, adult ([place])   . Morbid obesity ([place])   . Chronic pain of left knee Yes    The patient meets the AMA guidelines for Morbid (severe) obesity with a BMI > 40.0 and I have recommended weight loss.  69 year old male status post Monovisc injection left knee he is also lost 50 pounds his BMI is down to 40  Says his knee feels better he is moving better in terms of function and he has less pain  We will see him back in April for 1 year x-ray follow-up

## 2019-11-28 NOTE — Patient Instructions (Signed)
Keep weight down  Activities as tolerated

## 2019-12-06 ENCOUNTER — Other Ambulatory Visit: Payer: Self-pay | Admitting: Endocrinology

## 2019-12-06 ENCOUNTER — Ambulatory Visit (HOSPITAL_COMMUNITY)
Admission: RE | Admit: 2019-12-06 | Discharge: 2019-12-06 | Disposition: A | Discharge status: Home / Self Care | Payer: Medicare Other | Source: Ambulatory Visit | Attending: Nurse Practitioner | Admitting: Nurse Practitioner

## 2019-12-06 ENCOUNTER — Other Ambulatory Visit: Payer: Self-pay

## 2019-12-06 VITALS — BP 132/76 | HR 36 | Ht 71.0 in | Wt 288.2 lb

## 2019-12-06 DIAGNOSIS — Z79899 Other long term (current) drug therapy: Secondary | ICD-10-CM | POA: Diagnosis not present

## 2019-12-06 DIAGNOSIS — D6869 Other thrombophilia: Secondary | ICD-10-CM

## 2019-12-06 DIAGNOSIS — I4819 Other persistent atrial fibrillation: Secondary | ICD-10-CM | POA: Insufficient documentation

## 2019-12-06 DIAGNOSIS — I4891 Unspecified atrial fibrillation: Secondary | ICD-10-CM | POA: Diagnosis not present

## 2019-12-06 DIAGNOSIS — Z8249 Family history of ischemic heart disease and other diseases of the circulatory system: Secondary | ICD-10-CM | POA: Insufficient documentation

## 2019-12-06 DIAGNOSIS — R001 Bradycardia, unspecified: Secondary | ICD-10-CM | POA: Insufficient documentation

## 2019-12-06 MED ORDER — AMIODARONE HCL 200 MG PO TABS
100.0000 mg | ORAL_TABLET | Freq: Every day | ORAL | 2 refills | Status: DC
Start: 2019-12-06 — End: 2020-01-14

## 2019-12-06 NOTE — Patient Instructions (Signed)
Decrease amiodarone to 100mg  once a day for the next week then stop
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
## 2019-12-25 DIAGNOSIS — Z794 Long term (current) use of insulin: Secondary | ICD-10-CM | POA: Diagnosis not present

## 2019-12-25 DIAGNOSIS — E1165 Type 2 diabetes mellitus with hyperglycemia: Secondary | ICD-10-CM | POA: Diagnosis not present

## 2019-12-25 DIAGNOSIS — I10 Essential (primary) hypertension: Secondary | ICD-10-CM | POA: Diagnosis not present

## 2020-01-01 DIAGNOSIS — I10 Essential (primary) hypertension: Secondary | ICD-10-CM | POA: Diagnosis not present

## 2020-01-01 DIAGNOSIS — N400 Enlarged prostate without lower urinary tract symptoms: Secondary | ICD-10-CM | POA: Diagnosis not present

## 2020-01-01 DIAGNOSIS — F5221 Male erectile disorder: Secondary | ICD-10-CM | POA: Diagnosis not present

## 2020-01-01 DIAGNOSIS — E1165 Type 2 diabetes mellitus with hyperglycemia: Secondary | ICD-10-CM | POA: Diagnosis not present

## 2020-01-01 LAB — BASIC METABOLIC PANEL: Creatinine: 1 (ref 0.6–1.3)

## 2020-01-01 LAB — HEMOGLOBIN A1C: Hemoglobin A1C: 5.3

## 2020-01-03 DIAGNOSIS — E1165 Type 2 diabetes mellitus with hyperglycemia: Secondary | ICD-10-CM | POA: Diagnosis not present

## 2020-01-03 DIAGNOSIS — Z6841 Body Mass Index (BMI) 40.0 and over, adult: Secondary | ICD-10-CM | POA: Diagnosis not present

## 2020-01-03 DIAGNOSIS — R001 Bradycardia, unspecified: Secondary | ICD-10-CM | POA: Diagnosis not present

## 2020-01-03 DIAGNOSIS — Z0001 Encounter for general adult medical examination with abnormal findings: Secondary | ICD-10-CM | POA: Diagnosis not present

## 2020-01-03 DIAGNOSIS — N401 Enlarged prostate with lower urinary tract symptoms: Secondary | ICD-10-CM | POA: Diagnosis not present

## 2020-01-03 DIAGNOSIS — M1990 Unspecified osteoarthritis, unspecified site: Secondary | ICD-10-CM | POA: Diagnosis not present

## 2020-01-03 DIAGNOSIS — Z720 Tobacco use: Secondary | ICD-10-CM | POA: Diagnosis not present

## 2020-01-11 ENCOUNTER — Other Ambulatory Visit: Payer: Medicare Other

## 2020-01-14 ENCOUNTER — Other Ambulatory Visit: Payer: Self-pay

## 2020-01-14 ENCOUNTER — Ambulatory Visit (INDEPENDENT_AMBULATORY_CARE_PROVIDER_SITE_OTHER): Payer: Medicare Other | Admitting: Endocrinology

## 2020-01-14 ENCOUNTER — Encounter: Payer: Self-pay | Admitting: Endocrinology

## 2020-01-14 ENCOUNTER — Ambulatory Visit: Payer: Medicare Other | Admitting: Endocrinology

## 2020-01-14 VITALS — BP 120/68 | HR 95 | Ht 70.0 in | Wt 279.8 lb

## 2020-01-14 DIAGNOSIS — I2510 Atherosclerotic heart disease of native coronary artery without angina pectoris: Secondary | ICD-10-CM

## 2020-01-14 DIAGNOSIS — E669 Obesity, unspecified: Secondary | ICD-10-CM | POA: Diagnosis not present

## 2020-01-14 DIAGNOSIS — E1169 Type 2 diabetes mellitus with other specified complication: Secondary | ICD-10-CM

## 2020-01-14 NOTE — Patient Instructions (Signed)
Tresiba 40  Check blood sugars on waking up 3-4  days a week  Also check blood sugars about 2 hours after meals and do this after different meals by rotation  Recommended blood sugar levels on waking up are 90-130 and about 2 hours after meal is 130-160  Please bring your blood sugar monitor to each visit, thank you
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
## 2020-01-20 ENCOUNTER — Other Ambulatory Visit: Payer: Self-pay | Admitting: Endocrinology

## 2020-01-24 DIAGNOSIS — R21 Rash and other nonspecific skin eruption: Secondary | ICD-10-CM | POA: Diagnosis not present

## 2020-02-01 ENCOUNTER — Ambulatory Visit (INDEPENDENT_AMBULATORY_CARE_PROVIDER_SITE_OTHER): Payer: Medicare Other | Admitting: Internal Medicine

## 2020-02-01 ENCOUNTER — Encounter: Payer: Self-pay | Admitting: Internal Medicine

## 2020-02-01 VITALS — BP 120/74 | HR 51 | Resp 16 | Ht 71.0 in | Wt 276.0 lb

## 2020-02-01 DIAGNOSIS — D6869 Other thrombophilia: Secondary | ICD-10-CM | POA: Diagnosis not present

## 2020-02-01 DIAGNOSIS — G4733 Obstructive sleep apnea (adult) (pediatric): Secondary | ICD-10-CM | POA: Diagnosis not present

## 2020-02-01 DIAGNOSIS — I4891 Unspecified atrial fibrillation: Secondary | ICD-10-CM

## 2020-02-01 NOTE — Patient Instructions (Addendum)
Medication Instructions:  Your physician recommends that you continue on your current medications as directed. Please refer to the Current Medication list given to you today.   Labwork: None  Testing/Procedures: None  Follow-Up: Your physician recommends that you schedule a follow-up appointment in: 4 months    Any Other Special Instructions Will Be Listed Below (If Applicable).     If you need a refill on your cardiac medications before your next appointment, please call your pharmacy.   
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
## 2020-02-18 ENCOUNTER — Other Ambulatory Visit: Payer: Self-pay | Admitting: Internal Medicine

## 2020-02-18 ENCOUNTER — Other Ambulatory Visit: Payer: Self-pay | Admitting: Endocrinology

## 2020-02-23 DIAGNOSIS — Z794 Long term (current) use of insulin: Secondary | ICD-10-CM | POA: Diagnosis not present

## 2020-02-23 DIAGNOSIS — I10 Essential (primary) hypertension: Secondary | ICD-10-CM | POA: Diagnosis not present

## 2020-02-23 DIAGNOSIS — E1165 Type 2 diabetes mellitus with hyperglycemia: Secondary | ICD-10-CM | POA: Diagnosis not present

## 2020-02-23 DIAGNOSIS — I482 Chronic atrial fibrillation, unspecified: Secondary | ICD-10-CM | POA: Diagnosis not present

## 2020-03-19 DIAGNOSIS — H401222 Low-tension glaucoma, left eye, moderate stage: Secondary | ICD-10-CM | POA: Diagnosis not present

## 2020-03-19 DIAGNOSIS — H25813 Combined forms of age-related cataract, bilateral: Secondary | ICD-10-CM | POA: Diagnosis not present

## 2020-03-19 DIAGNOSIS — H401213 Low-tension glaucoma, right eye, severe stage: Secondary | ICD-10-CM | POA: Diagnosis not present

## 2020-03-23 ENCOUNTER — Other Ambulatory Visit: Payer: Self-pay | Admitting: Endocrinology

## 2020-03-24 DIAGNOSIS — Z794 Long term (current) use of insulin: Secondary | ICD-10-CM | POA: Diagnosis not present

## 2020-03-24 DIAGNOSIS — E1165 Type 2 diabetes mellitus with hyperglycemia: Secondary | ICD-10-CM | POA: Diagnosis not present

## 2020-03-24 DIAGNOSIS — I482 Chronic atrial fibrillation, unspecified: Secondary | ICD-10-CM | POA: Diagnosis not present

## 2020-03-24 DIAGNOSIS — I10 Essential (primary) hypertension: Secondary | ICD-10-CM | POA: Diagnosis not present

## 2020-03-27 ENCOUNTER — Encounter: Payer: Self-pay | Admitting: Dermatology

## 2020-03-27 ENCOUNTER — Ambulatory Visit (INDEPENDENT_AMBULATORY_CARE_PROVIDER_SITE_OTHER): Payer: Medicare Other | Admitting: Dermatology

## 2020-03-27 ENCOUNTER — Other Ambulatory Visit: Payer: Self-pay

## 2020-03-27 DIAGNOSIS — L309 Dermatitis, unspecified: Secondary | ICD-10-CM

## 2020-03-27 MED ORDER — MINOCYCLINE HCL 50 MG PO CAPS: 50.0000 mg | ORAL_CAPSULE | Freq: Two times a day (BID) | ORAL | 0 refills | Status: DC

## 2020-03-27 MED ORDER — HYDROCORTISONE 2.5 % EX CREA: TOPICAL_CREAM | Freq: Two times a day (BID) | CUTANEOUS | 1 refills | Status: DC | PRN

## 2020-04-11 ENCOUNTER — Encounter: Payer: Self-pay | Admitting: Dermatology

## 2020-04-11 NOTE — Progress Notes (Signed)
   Follow-Up Visit   Subjective  Lawrence Jordan is a 70 y.o. male who presents for the following: Rash (Facial rash new beard oil, shampoo d/c treatment Vaseline and TAC also his leg is flared again ).  Rashes, face, neck, lower legs Location:  Duration:  Quality:  Associated Signs/Symptoms: Modifying Factors: Has been using triamcinolone on his face. Severity:  Timing: Context:   Objective  Well appearing patient in no apparent distress; mood and affect are within normal limits. Objective  Left Lower Leg - Anterior, Right Lower Leg - Anterior: 2+ lower extremity edema with some patchy erythema and chronic dermatitis, predominantly stasis dermatitis perhaps a component of nummular eczema as well.  Objective  Head - Anterior (Face): Eyes are red but patient stated he was being treated for glaucoma. Lawrence Jordan noticed left ear crust irritation also left neck rash like area (urticarial dermatitis).  Discussed patch testing and stopping TAC on the face.  Clinically this is a mixture of rosacea plus dermatitis.    A focused examination was performed including Head, neck, arms, legs.. Relevant physical exam findings are noted in the Assessment and Plan.   Assessment & Plan    Dermatitis (2) Left Lower Leg - Anterior; Right Lower Leg - Anterior  Lawrence Jordan may use the 2 and half percent Hytone on his legs daily after bathing.  If this does not give him adequate relief, he can call us for a stronger topical.  Eczema, unspecified type Head - Anterior (Face)  Minocycline 50 mg twice daily for 1 to 2 months along with topical hydrocortisone ointment nightly for 2 to 4 weeks.  Discontinue use of triamcinolone.  If there is no improvement, will come for follow-up on a Monday to consider patch testing.  minocycline (MINOCIN) 50 MG capsule - Head - Anterior (Face)  hydrocortisone 2.5 % cream - Head - Anterior (Face)      I, Lawrence Monarch, MD, have reviewed all documentation for this  visit.  The documentation on 04/11/20 for the exam, diagnosis, procedures, and orders are all accurate and complete.

## 2020-04-14 ENCOUNTER — Ambulatory Visit: Payer: Medicare Other | Admitting: Endocrinology

## 2020-04-20 ENCOUNTER — Other Ambulatory Visit: Payer: Self-pay | Admitting: Dermatology

## 2020-04-20 DIAGNOSIS — L309 Dermatitis, unspecified: Secondary | ICD-10-CM

## 2020-04-23 DIAGNOSIS — N186 End stage renal disease: Secondary | ICD-10-CM | POA: Diagnosis not present

## 2020-04-23 DIAGNOSIS — I120 Hypertensive chronic kidney disease with stage 5 chronic kidney disease or end stage renal disease: Secondary | ICD-10-CM | POA: Diagnosis not present

## 2020-04-23 DIAGNOSIS — D509 Iron deficiency anemia, unspecified: Secondary | ICD-10-CM | POA: Diagnosis not present

## 2020-04-23 DIAGNOSIS — E7849 Other hyperlipidemia: Secondary | ICD-10-CM | POA: Diagnosis not present

## 2020-04-28 ENCOUNTER — Ambulatory Visit (INDEPENDENT_AMBULATORY_CARE_PROVIDER_SITE_OTHER): Payer: Medicare Other | Admitting: Orthopedic Surgery

## 2020-04-28 ENCOUNTER — Ambulatory Visit: Payer: Medicare Other

## 2020-04-28 ENCOUNTER — Other Ambulatory Visit: Payer: Self-pay

## 2020-04-28 ENCOUNTER — Encounter: Payer: Self-pay | Admitting: Orthopedic Surgery

## 2020-04-28 VITALS — Ht 71.0 in | Wt 260.0 lb

## 2020-04-28 DIAGNOSIS — M1712 Unilateral primary osteoarthritis, left knee: Secondary | ICD-10-CM | POA: Diagnosis not present

## 2020-04-28 DIAGNOSIS — M25562 Pain in left knee: Secondary | ICD-10-CM

## 2020-04-28 DIAGNOSIS — G8929 Other chronic pain: Secondary | ICD-10-CM | POA: Diagnosis not present

## 2020-04-28 DIAGNOSIS — M7711 Lateral epicondylitis, right elbow: Secondary | ICD-10-CM | POA: Diagnosis not present

## 2020-04-28 NOTE — Patient Instructions (Signed)
Tennis Elbow  Tennis elbow is irritation and swelling (inflammation) in your outer forearm, near your elbow. Swelling affects the tissues that connect muscle to bone (tendons). Tennis elbow can happen playing any sport or doing any job where you use your elbow too much. It is caused by doing the same motion over and over. What are the causes? This condition is often caused by playing sports or doing work where you need to keep moving your forearm the same way. Sometimes, it may be caused by a sudden injury. What increases the risk? You are more likely to get tennis elbow if you play tennis or another racket sport. You also have a higher risk if you often use your hands for work. This includes:  People who use computers.  Architect workers.  People who work in a factory.  Musicians.  Cooks.  Cashiers. What are the signs or symptoms?  Pain and tenderness in your forearm and the outer part of your elbow. You may have pain all the time or only when you use your arm.  A burning feeling. This starts in your elbow and spreads down your arm.  A weak grip in your hand. How is this treated? Resting and icing your arm is often the first treatment. Your doctor may also recommend:  Medicines to reduce pain and swelling.  An elbow strap.  Physical therapy. This may include massage or exercises or both.  An elbow brace. If these do not help your symptoms get better, your doctor may recommend surgery. Follow these instructions at home: If you have a brace or strap:  Wear the brace or strap as told by your doctor. Take it off only as told by your doctor.  Check the skin around the brace or strap every day. Tell your doctor if you see problems.  Loosen it if your fingers: ? Tingle. ? Become numb. ? Turn cold and blue.  Keep the brace or strap clean.  If the brace or strap is not waterproof: ? Do not let it get wet. ? Cover it with a watertight covering when you take a bath or a  shower. Managing pain, stiffness, and swelling  If told, put ice on the injured area. To do this: ? If you have a removable brace or strap, take it off as told by your doctor. ? Put ice in a plastic bag. ? Place a towel between your skin and the bag. ? Leave the ice on for 20 minutes, 2-3 times a day. ? Take off the ice if your skin turns bright red. This is very important. If you cannot feel pain, heat, or cold, you have a greater risk of damage to the area.  Move your fingers often.   Activity  Rest your elbow and wrist. Avoid activities that can cause elbow problems as told by your doctor.  Do exercises as told by your doctor.  If you lift an object, lift it with your palm facing up. Lifestyle  If your tennis elbow is caused by sports, check your equipment and make sure that: ? You are using it the right way. ? It fits you well.  If your tennis elbow is caused by work or computer use, take breaks often to stretch your arm. Talk with your manager about how you can make your condition better at work. General instructions  Take over-the-counter and prescription medicines only as told by your doctor.  Do not smoke or use any products that contain nicotine or tobacco.  If you need help quitting, ask your doctor.  Keep all follow-up visits. How is this prevented?  Before and after being active: ? Warm up and stretch before being active. ? Cool down and stretch after being active. ? Give your body time to rest between activities.  While being active: ? Make sure to use equipment that fits you. ? If you play tennis, put power in your stroke with your lower body. Avoid using your arm only.  Maintain physical fitness. This includes: ? Strength. ? Flexibility. ? Endurance.  Do exercises to strengthen the forearm muscles. Contact a doctor if:  Your pain does not get better with treatment.  Your pain gets worse.  You have weakness in your forearm, hand, or fingers.  You  cannot feel your forearm, hand, or fingers. Get help right away if:  Your pain is very bad.  You cannot move your wrist. Summary  Tennis elbow is irritation and swelling (inflammation) in your outer forearm, near your elbow.  Tennis elbow is caused by doing the same motion over and over.  Rest your elbow and wrist. Avoid activities as told by your doctor.  If told, put ice on the injured area for 20 minutes, 2-3 times a day. This information is not intended to replace advice given to you by your health care provider. Make sure you discuss any questions you have with your health care provider. Document Revised: 07/24/2019 Document Reviewed: 07/24/2019 Elsevier Patient Education  2021 [employer] Inc.  

## 2020-04-28 NOTE — Progress Notes (Signed)
Chief Complaint  Patient presents with  . Knee Pain-LEFT    Has gotten painful again recently     70 year old male with chronic arthritis left knee presents for routine follow-up.  He is a patient who is received Monovisc in his knee with good result.  Says been exercising more lately and the knee is swelling and hurting every day  Ht 5\' 11"  (1.803 m)   Wt 260 lb (117.9 kg)   BMI 36.26 kg/m   Physical Exam Constitutional:      General: He is not in acute distress.    Appearance: He is well-developed. He is not diaphoretic.  HENT:     Head: Normocephalic and atraumatic.  Eyes:     General: No scleral icterus.       Right eye: No discharge.        Left eye: No discharge.  Neck:     Thyroid: No thyromegaly.     Vascular: No JVD.     Trachea: No tracheal deviation.  Musculoskeletal:     Comments: Varus left knee with tenderness over the medial joint line and restricted range of motion in flexion and extension  Right elbow tenderness over the lateral epicondyles mild swelling painful wrist extension  Skin:    General: Skin is warm and dry.     Coloration: Skin is not pale.     Findings: No erythema or rash.  Neurological:     Mental Status: He is alert and oriented to person, place, and time.     Cranial Nerves: No cranial nerve deficit.     Motor: No abnormal muscle tone.     Coordination: Coordination normal.     Deep Tendon Reflexes: Reflexes are normal and symmetric. Reflexes normal.  Psychiatric:        Behavior: Behavior normal.     Images today compared to x-rays taken last year show no major change in the knee he has a varus knee with mild varus severe narrowing of the medial compartment and degenerative changes in all 3 compartments  Inject Monovisc left knee Procedure note the left knee was prepped with alcohol and ethyl chloride patient gave consent to inject his left knee this was done through a lateral approach without complication  Inject right  elbow  Patient has tenderness over his right elbow week consented for injection of right elbow for tennis elbow  Celestone 6 mg and 1 cc of 1 1% lidocaine was injected into the right elbow at the lateral epicondyle  Injection Brace Exercise Instructions for ice for tennis elbow Follow-up in 6 months x-ray

## 2020-04-30 ENCOUNTER — Other Ambulatory Visit: Payer: Self-pay | Admitting: Endocrinology

## 2020-05-14 ENCOUNTER — Telehealth: Payer: Self-pay | Admitting: Orthopedic Surgery

## 2020-05-14 NOTE — Telephone Encounter (Signed)
Call received via voice message/call returned; reached voice mail, left message to call and ask for Lawrence Jordan (patient has question about bill received for [name])

## 2020-05-19 ENCOUNTER — Other Ambulatory Visit: Payer: Self-pay | Admitting: Dermatology

## 2020-05-19 ENCOUNTER — Ambulatory Visit: Payer: Medicare Other | Admitting: Dermatology

## 2020-05-19 ENCOUNTER — Other Ambulatory Visit: Payer: Self-pay | Admitting: Endocrinology

## 2020-05-19 DIAGNOSIS — L309 Dermatitis, unspecified: Secondary | ICD-10-CM

## 2020-05-19 NOTE — Telephone Encounter (Signed)
Patient called back - relayed our [facility] [employer] representative Chrys Racer will review and will call him back. Aware.

## 2020-05-20 ENCOUNTER — Other Ambulatory Visit: Payer: Self-pay | Admitting: *Deleted

## 2020-05-20 MED ORDER — ONETOUCH VERIO VI STRP: ORAL_STRIP | 3 refills | Status: DC

## 2020-05-24 DIAGNOSIS — I482 Chronic atrial fibrillation, unspecified: Secondary | ICD-10-CM | POA: Diagnosis not present

## 2020-05-24 DIAGNOSIS — I10 Essential (primary) hypertension: Secondary | ICD-10-CM | POA: Diagnosis not present

## 2020-05-24 DIAGNOSIS — Z794 Long term (current) use of insulin: Secondary | ICD-10-CM | POA: Diagnosis not present

## 2020-05-24 DIAGNOSIS — E1165 Type 2 diabetes mellitus with hyperglycemia: Secondary | ICD-10-CM | POA: Diagnosis not present

## 2020-05-30 ENCOUNTER — Encounter: Payer: Self-pay | Admitting: Internal Medicine

## 2020-05-30 ENCOUNTER — Ambulatory Visit (INDEPENDENT_AMBULATORY_CARE_PROVIDER_SITE_OTHER): Payer: Medicare Other | Admitting: Internal Medicine

## 2020-05-30 VITALS — BP 100/62 | HR 41 | Ht 71.0 in | Wt 262.8 lb

## 2020-05-30 DIAGNOSIS — I4819 Other persistent atrial fibrillation: Secondary | ICD-10-CM | POA: Diagnosis not present

## 2020-05-30 DIAGNOSIS — G4733 Obstructive sleep apnea (adult) (pediatric): Secondary | ICD-10-CM | POA: Diagnosis not present

## 2020-05-30 DIAGNOSIS — I10 Essential (primary) hypertension: Secondary | ICD-10-CM

## 2020-05-30 DIAGNOSIS — D6869 Other thrombophilia: Secondary | ICD-10-CM

## 2020-05-30 MED ORDER — POTASSIUM CHLORIDE CRYS ER 20 MEQ PO TBCR: 20.0000 meq | EXTENDED_RELEASE_TABLET | Freq: Every day | ORAL | Status: DC

## 2020-05-30 MED ORDER — FUROSEMIDE 20 MG PO TABS: 20.0000 mg | ORAL_TABLET | Freq: Every day | ORAL | 3 refills | Status: DC
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
## 2020-05-30 NOTE — Patient Instructions (Addendum)
Medication Instructions:   Decrease Lasix to 20mg  daily.  Decrease Potassium to 67meq daily.   Continue all other medications.    Labwork: none  Testing/Procedures: none  Follow-Up: 6 months   Any Other Special Instructions Will Be Listed Below (If Applicable).  If you need a refill on your cardiac medications before your next appointment, please call your pharmacy.

## 2020-06-02 ENCOUNTER — Other Ambulatory Visit: Payer: Self-pay

## 2020-06-02 ENCOUNTER — Ambulatory Visit (INDEPENDENT_AMBULATORY_CARE_PROVIDER_SITE_OTHER): Payer: Medicare Other | Admitting: Endocrinology

## 2020-06-02 ENCOUNTER — Encounter: Payer: Self-pay | Admitting: Endocrinology

## 2020-06-02 VITALS — BP 96/64 | HR 46 | Ht 70.0 in | Wt 264.0 lb

## 2020-06-02 DIAGNOSIS — Z794 Long term (current) use of insulin: Secondary | ICD-10-CM

## 2020-06-02 DIAGNOSIS — E1165 Type 2 diabetes mellitus with hyperglycemia: Secondary | ICD-10-CM | POA: Diagnosis not present

## 2020-06-02 DIAGNOSIS — E1129 Type 2 diabetes mellitus with other diabetic kidney complication: Secondary | ICD-10-CM | POA: Diagnosis not present

## 2020-06-02 DIAGNOSIS — E669 Obesity, unspecified: Secondary | ICD-10-CM

## 2020-06-02 DIAGNOSIS — I10 Essential (primary) hypertension: Secondary | ICD-10-CM

## 2020-06-02 DIAGNOSIS — E782 Mixed hyperlipidemia: Secondary | ICD-10-CM | POA: Diagnosis not present

## 2020-06-02 DIAGNOSIS — E1169 Type 2 diabetes mellitus with other specified complication: Secondary | ICD-10-CM

## 2020-06-02 DIAGNOSIS — R809 Proteinuria, unspecified: Secondary | ICD-10-CM | POA: Diagnosis not present

## 2020-06-02 LAB — LIPID PANEL
Cholesterol: 111 mg/dL (ref 0–200)
HDL: 43.1 mg/dL (ref >39.00)
LDL Cholesterol: 53 mg/dL (ref 0–99)
NonHDL: 67.6
Total CHOL/HDL Ratio: 3
Triglycerides: 75 mg/dL (ref 0.0–149.0)
VLDL: 15 mg/dL (ref 0.0–40.0)

## 2020-06-02 LAB — COMPREHENSIVE METABOLIC PANEL
ALT: 16 U/L (ref 0–53)
AST: 21 U/L (ref 0–37)
Albumin: 4.3 g/dL (ref 3.5–5.2)
Alkaline Phosphatase: 51 U/L (ref 39–117)
BUN: 18 mg/dL (ref 6–23)
CO2: 26 mEq/L (ref 19–32)
Calcium: 9.7 mg/dL (ref 8.4–10.5)
Chloride: 103 mEq/L (ref 96–112)
Creatinine, Ser: 0.77 mg/dL (ref 0.40–1.50)
GFR: 91.37 mL/min (ref >60.00)
Glucose, Bld: 71 mg/dL (ref 70–99)
Potassium: 3.8 mEq/L (ref 3.5–5.1)
Sodium: 138 mEq/L (ref 135–145)
Total Bilirubin: 0.6 mg/dL (ref 0.2–1.2)
Total Protein: 7.2 g/dL (ref 6.0–8.3)

## 2020-06-02 LAB — POCT GLYCOSYLATED HEMOGLOBIN (HGB A1C): Hemoglobin A1C: 4.9 % (ref 4.0–5.6)

## 2020-06-02 NOTE — Patient Instructions (Addendum)
Cut Losartan in 1/2  No Novolog  Check blood sugars on waking up days 3-4 a week  Also check blood sugars about 2 hours after meals and do this after different meals by rotation  Recommended blood sugar levels on waking up are 80-120 and about 2 hours after meal is 130-160  Please bring your blood sugar monitor to each visit, thank you
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
## 2020-06-03 ENCOUNTER — Ambulatory Visit: Payer: Medicare Other | Admitting: Endocrinology

## 2020-06-03 LAB — MICROALBUMIN / CREATININE URINE RATIO
Creatinine,U: 45.9 mg/dL
Microalb Creat Ratio: 1.5 mg/g (ref 0.0–30.0)
Microalb, Ur: <0.7 mg/dL (ref 0.0–1.9)

## 2020-06-10 DIAGNOSIS — H04123 Dry eye syndrome of bilateral lacrimal glands: Secondary | ICD-10-CM | POA: Diagnosis not present

## 2020-06-23 DIAGNOSIS — I482 Chronic atrial fibrillation, unspecified: Secondary | ICD-10-CM | POA: Diagnosis not present

## 2020-06-23 DIAGNOSIS — E1165 Type 2 diabetes mellitus with hyperglycemia: Secondary | ICD-10-CM | POA: Diagnosis not present

## 2020-06-23 DIAGNOSIS — Z794 Long term (current) use of insulin: Secondary | ICD-10-CM | POA: Diagnosis not present

## 2020-06-23 DIAGNOSIS — I10 Essential (primary) hypertension: Secondary | ICD-10-CM | POA: Diagnosis not present

## 2020-06-25 DIAGNOSIS — Z23 Encounter for immunization: Secondary | ICD-10-CM | POA: Diagnosis not present

## 2020-06-26 ENCOUNTER — Other Ambulatory Visit: Payer: Self-pay | Admitting: Dermatology

## 2020-06-26 DIAGNOSIS — L309 Dermatitis, unspecified: Secondary | ICD-10-CM

## 2020-06-26 NOTE — Telephone Encounter (Signed)
Please advise 

## 2020-06-30 DIAGNOSIS — H401213 Low-tension glaucoma, right eye, severe stage: Secondary | ICD-10-CM | POA: Diagnosis not present

## 2020-06-30 DIAGNOSIS — H25813 Combined forms of age-related cataract, bilateral: Secondary | ICD-10-CM | POA: Diagnosis not present

## 2020-06-30 DIAGNOSIS — H401222 Low-tension glaucoma, left eye, moderate stage: Secondary | ICD-10-CM | POA: Diagnosis not present

## 2020-07-02 DIAGNOSIS — B353 Tinea pedis: Secondary | ICD-10-CM | POA: Diagnosis not present

## 2020-07-02 DIAGNOSIS — M1990 Unspecified osteoarthritis, unspecified site: Secondary | ICD-10-CM | POA: Diagnosis not present

## 2020-07-02 DIAGNOSIS — Z6836 Body mass index (BMI) 36.0-36.9, adult: Secondary | ICD-10-CM | POA: Diagnosis not present

## 2020-07-02 DIAGNOSIS — E1165 Type 2 diabetes mellitus with hyperglycemia: Secondary | ICD-10-CM | POA: Diagnosis not present

## 2020-07-02 DIAGNOSIS — R001 Bradycardia, unspecified: Secondary | ICD-10-CM | POA: Diagnosis not present

## 2020-07-02 DIAGNOSIS — N401 Enlarged prostate with lower urinary tract symptoms: Secondary | ICD-10-CM | POA: Diagnosis not present

## 2020-07-03 NOTE — Telephone Encounter (Signed)
Please advise 

## 2020-07-09 DIAGNOSIS — E7801 Familial hypercholesterolemia: Secondary | ICD-10-CM | POA: Diagnosis not present

## 2020-07-09 DIAGNOSIS — Z20828 Contact with and (suspected) exposure to other viral communicable diseases: Secondary | ICD-10-CM | POA: Diagnosis not present

## 2020-07-09 DIAGNOSIS — E1165 Type 2 diabetes mellitus with hyperglycemia: Secondary | ICD-10-CM | POA: Diagnosis not present

## 2020-07-09 DIAGNOSIS — I482 Chronic atrial fibrillation, unspecified: Secondary | ICD-10-CM | POA: Diagnosis not present

## 2020-07-18 ENCOUNTER — Ambulatory Visit (HOSPITAL_COMMUNITY): Payer: Medicare Other | Admitting: Physician Assistant

## 2020-07-18 ENCOUNTER — Telehealth: Payer: Self-pay | Admitting: *Deleted

## 2020-07-18 NOTE — Telephone Encounter (Addendum)
Confirmed that he is not taking metoprolol and says it was stopped by Dr. [name].  Has not checked blood pressure

## 2020-07-18 NOTE — Telephone Encounter (Signed)
Reports a little SOB. Denies dizziness or chest pain. Pulse Ox 98% & HR 68 while on phone. Unable to check HR manually. Reports taking amiodarone 200 mg yesterday and again today on his own. Reports pulse feels irregular and he feels like his heart is out of rhythm. Advised that he needed an evaluation in the office. Offered appointment today in [facility] but declined due to being in [place]. Advised that he will be contacted for an appointment in the [facility] and if his symptoms got worse, that he needed to go to the ED for an evaluation. Says he will be back in town next Thursday and would need the lasted appointment that day. Verbalized understanding of plan.

## 2020-07-21 NOTE — Telephone Encounter (Signed)
Pt scheduled for 6/30 per pt request

## 2020-07-24 ENCOUNTER — Ambulatory Visit (HOSPITAL_COMMUNITY)
Admission: RE | Admit: 2020-07-24 | Discharge: 2020-07-24 | Disposition: A | Discharge status: Home / Self Care | Payer: Medicare Other | Source: Ambulatory Visit | Attending: Nurse Practitioner | Admitting: Nurse Practitioner

## 2020-07-24 ENCOUNTER — Encounter (HOSPITAL_COMMUNITY): Payer: Self-pay | Admitting: Nurse Practitioner

## 2020-07-24 ENCOUNTER — Other Ambulatory Visit: Payer: Self-pay

## 2020-07-24 VITALS — BP 140/74 | HR 148 | Ht 70.0 in | Wt 251.0 lb

## 2020-07-24 DIAGNOSIS — D6869 Other thrombophilia: Secondary | ICD-10-CM | POA: Diagnosis not present

## 2020-07-24 DIAGNOSIS — Z79899 Other long term (current) drug therapy: Secondary | ICD-10-CM | POA: Insufficient documentation

## 2020-07-24 DIAGNOSIS — Z888 Allergy status to other drugs, medicaments and biological substances status: Secondary | ICD-10-CM | POA: Diagnosis not present

## 2020-07-24 DIAGNOSIS — Z8616 Personal history of COVID-19: Secondary | ICD-10-CM | POA: Insufficient documentation

## 2020-07-24 DIAGNOSIS — R635 Abnormal weight gain: Secondary | ICD-10-CM | POA: Insufficient documentation

## 2020-07-24 DIAGNOSIS — Z7984 Long term (current) use of oral hypoglycemic drugs: Secondary | ICD-10-CM | POA: Diagnosis not present

## 2020-07-24 DIAGNOSIS — Z9114 Patient's other noncompliance with medication regimen: Secondary | ICD-10-CM | POA: Diagnosis not present

## 2020-07-24 DIAGNOSIS — I4892 Unspecified atrial flutter: Secondary | ICD-10-CM | POA: Diagnosis not present

## 2020-07-24 DIAGNOSIS — I4819 Other persistent atrial fibrillation: Secondary | ICD-10-CM | POA: Diagnosis not present

## 2020-07-24 DIAGNOSIS — Z881 Allergy status to other antibiotic agents status: Secondary | ICD-10-CM | POA: Insufficient documentation

## 2020-07-24 DIAGNOSIS — Z7901 Long term (current) use of anticoagulants: Secondary | ICD-10-CM | POA: Diagnosis not present

## 2020-07-24 DIAGNOSIS — Z6836 Body mass index (BMI) 36.0-36.9, adult: Secondary | ICD-10-CM | POA: Insufficient documentation

## 2020-07-24 DIAGNOSIS — Z96643 Presence of artificial hip joint, bilateral: Secondary | ICD-10-CM | POA: Diagnosis not present

## 2020-07-24 LAB — CBC
HCT: 41.1 % (ref 39.0–52.0)
Hemoglobin: 13.3 g/dL (ref 13.0–17.0)
MCH: 29 pg (ref 26.0–34.0)
MCHC: 32.4 g/dL (ref 30.0–36.0)
MCV: 89.7 fL (ref 80.0–100.0)
Platelets: 195 10*3/uL (ref 150–400)
RBC: 4.58 MIL/uL (ref 4.22–5.81)
RDW: 15.2 % (ref 11.5–15.5)
WBC: 5.3 10*3/uL (ref 4.0–10.5)
nRBC: 0 % (ref 0.0–0.2)

## 2020-07-24 LAB — BASIC METABOLIC PANEL
Anion gap: 7 (ref 5–15)
BUN: 12 mg/dL (ref 8–23)
CO2: 26 mmol/L (ref 22–32)
Calcium: 9.8 mg/dL (ref 8.9–10.3)
Chloride: 105 mmol/L (ref 98–111)
Creatinine, Ser: 0.75 mg/dL (ref 0.61–1.24)
GFR, Estimated: >60 mL/min (ref >60)
Glucose, Bld: 90 mg/dL (ref 70–99)
Potassium: 4.1 mmol/L (ref 3.5–5.1)
Sodium: 138 mmol/L (ref 135–145)

## 2020-07-24 MED ORDER — METOPROLOL TARTRATE 25 MG PO TABS: 25.0000 mg | ORAL_TABLET | Freq: Two times a day (BID) | ORAL | 3 refills | Status: DC

## 2020-07-24 NOTE — Patient Instructions (Addendum)
Stop amiodarone  Start metoprolol 25mg  twice a day  For the next 3 days increase lasix to 40mg  daily - then reduce back to 20mg  a day.  Cardioversion scheduled for Thursday, July 7th  - Arrive at the [facility] and go to admitting at 830AM  - Do not eat or drink anything after midnight the night prior to your procedure.  - Take all your morning medication (except diabetic medications) with a sip of water prior to arrival.  - You will not be able to drive home after your procedure.  - Do NOT miss any doses of your blood thinner - if you should miss a dose please notify our office immediately.  - If you feel as if you go back into normal rhythm prior to scheduled cardioversion, please notify our office immediately. If your procedure is canceled in the cardioversion suite you will be charged a cancellation fee.  Patients will be asked to: to mask in public and hand hygiene (no longer quarantine) in the 3 days prior to surgery, to report if any COVID-19-like illness or household contacts to COVID-19 to determine need for testing
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
## 2020-07-29 ENCOUNTER — Ambulatory Visit (HOSPITAL_COMMUNITY)
Admission: RE | Admit: 2020-07-29 | Discharge: 2020-07-29 | Disposition: A | Discharge status: Home / Self Care | Payer: Medicare Other | Source: Ambulatory Visit | Attending: Physician Assistant | Admitting: Physician Assistant

## 2020-07-29 ENCOUNTER — Other Ambulatory Visit: Payer: Self-pay

## 2020-07-29 VITALS — BP 96/64 | HR 59 | Wt 257.4 lb

## 2020-07-29 DIAGNOSIS — I4819 Other persistent atrial fibrillation: Secondary | ICD-10-CM | POA: Diagnosis not present

## 2020-07-29 DIAGNOSIS — I5032 Chronic diastolic (congestive) heart failure: Secondary | ICD-10-CM | POA: Diagnosis not present

## 2020-07-29 DIAGNOSIS — Z79899 Other long term (current) drug therapy: Secondary | ICD-10-CM | POA: Insufficient documentation

## 2020-07-29 DIAGNOSIS — D6869 Other thrombophilia: Secondary | ICD-10-CM | POA: Diagnosis not present

## 2020-07-29 DIAGNOSIS — I110 Hypertensive heart disease with heart failure: Secondary | ICD-10-CM | POA: Diagnosis not present

## 2020-07-29 DIAGNOSIS — Z8249 Family history of ischemic heart disease and other diseases of the circulatory system: Secondary | ICD-10-CM | POA: Diagnosis not present

## 2020-07-29 DIAGNOSIS — G4733 Obstructive sleep apnea (adult) (pediatric): Secondary | ICD-10-CM | POA: Diagnosis not present

## 2020-07-29 DIAGNOSIS — I4892 Unspecified atrial flutter: Secondary | ICD-10-CM | POA: Diagnosis not present

## 2020-07-29 DIAGNOSIS — Z880 Allergy status to penicillin: Secondary | ICD-10-CM | POA: Diagnosis not present

## 2020-07-29 DIAGNOSIS — Z7901 Long term (current) use of anticoagulants: Secondary | ICD-10-CM | POA: Insufficient documentation

## 2020-07-29 MED ORDER — FUROSEMIDE 20 MG PO TABS: 20.0000 mg | ORAL_TABLET | Freq: Every day | ORAL | 3 refills | Status: DC

## 2020-07-29 NOTE — Patient Instructions (Signed)
Increase lasix to 40mg  a day Increase potassium to 50meq twice a day Hold losartan
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
## 2020-07-31 ENCOUNTER — Encounter (HOSPITAL_COMMUNITY): Admission: RE | Payer: Self-pay | Source: Home / Self Care

## 2020-07-31 ENCOUNTER — Ambulatory Visit (HOSPITAL_COMMUNITY): Admission: RE | Admit: 2020-07-31 | Payer: Medicare Other | Source: Home / Self Care | Admitting: Cardiology

## 2020-07-31 SURGERY — CARDIOVERSION
Anesthesia: Monitor Anesthesia Care

## 2020-08-07 ENCOUNTER — Other Ambulatory Visit: Payer: Self-pay

## 2020-08-07 ENCOUNTER — Ambulatory Visit (HOSPITAL_COMMUNITY)
Admission: RE | Admit: 2020-08-07 | Discharge: 2020-08-07 | Disposition: A | Discharge status: Home / Self Care | Payer: Medicare Other | Source: Ambulatory Visit | Attending: Nurse Practitioner | Admitting: Nurse Practitioner

## 2020-08-07 ENCOUNTER — Other Ambulatory Visit: Payer: Self-pay | Admitting: Endocrinology

## 2020-08-07 ENCOUNTER — Encounter (HOSPITAL_COMMUNITY): Payer: Self-pay | Admitting: Nurse Practitioner

## 2020-08-07 VITALS — BP 116/56 | HR 44 | Ht 70.0 in | Wt 249.8 lb

## 2020-08-07 DIAGNOSIS — E7800 Pure hypercholesterolemia, unspecified: Secondary | ICD-10-CM | POA: Diagnosis not present

## 2020-08-07 DIAGNOSIS — I110 Hypertensive heart disease with heart failure: Secondary | ICD-10-CM | POA: Diagnosis not present

## 2020-08-07 DIAGNOSIS — I509 Heart failure, unspecified: Secondary | ICD-10-CM | POA: Diagnosis not present

## 2020-08-07 DIAGNOSIS — Z8249 Family history of ischemic heart disease and other diseases of the circulatory system: Secondary | ICD-10-CM | POA: Diagnosis not present

## 2020-08-07 DIAGNOSIS — I4892 Unspecified atrial flutter: Secondary | ICD-10-CM | POA: Insufficient documentation

## 2020-08-07 DIAGNOSIS — I4819 Other persistent atrial fibrillation: Secondary | ICD-10-CM | POA: Diagnosis not present

## 2020-08-07 DIAGNOSIS — Z7984 Long term (current) use of oral hypoglycemic drugs: Secondary | ICD-10-CM | POA: Diagnosis not present

## 2020-08-07 DIAGNOSIS — D6869 Other thrombophilia: Secondary | ICD-10-CM | POA: Diagnosis not present

## 2020-08-07 DIAGNOSIS — Z6835 Body mass index (BMI) 35.0-35.9, adult: Secondary | ICD-10-CM | POA: Insufficient documentation

## 2020-08-07 DIAGNOSIS — Z8616 Personal history of COVID-19: Secondary | ICD-10-CM | POA: Diagnosis not present

## 2020-08-07 DIAGNOSIS — G4733 Obstructive sleep apnea (adult) (pediatric): Secondary | ICD-10-CM | POA: Diagnosis not present

## 2020-08-07 DIAGNOSIS — E119 Type 2 diabetes mellitus without complications: Secondary | ICD-10-CM | POA: Insufficient documentation

## 2020-08-07 DIAGNOSIS — I2510 Atherosclerotic heart disease of native coronary artery without angina pectoris: Secondary | ICD-10-CM | POA: Diagnosis not present

## 2020-08-07 DIAGNOSIS — Z7901 Long term (current) use of anticoagulants: Secondary | ICD-10-CM | POA: Diagnosis not present

## 2020-08-07 DIAGNOSIS — I491 Atrial premature depolarization: Secondary | ICD-10-CM | POA: Diagnosis not present

## 2020-08-07 DIAGNOSIS — Z794 Long term (current) use of insulin: Secondary | ICD-10-CM | POA: Diagnosis not present

## 2020-08-07 DIAGNOSIS — Z79899 Other long term (current) drug therapy: Secondary | ICD-10-CM | POA: Insufficient documentation

## 2020-08-07 LAB — BASIC METABOLIC PANEL
Anion gap: 9 (ref 5–15)
BUN: 19 mg/dL (ref 8–23)
CO2: 23 mmol/L (ref 22–32)
Calcium: 9.5 mg/dL (ref 8.9–10.3)
Chloride: 106 mmol/L (ref 98–111)
Creatinine, Ser: 0.85 mg/dL (ref 0.61–1.24)
GFR, Estimated: >60 mL/min (ref >60)
Glucose, Bld: 80 mg/dL (ref 70–99)
Potassium: 4.8 mmol/L (ref 3.5–5.1)
Sodium: 138 mmol/L (ref 135–145)

## 2020-08-07 MED ORDER — METOPROLOL TARTRATE 25 MG PO TABS: 12.5000 mg | ORAL_TABLET | Freq: Two times a day (BID) | ORAL | 3 refills | Status: DC

## 2020-08-07 NOTE — Patient Instructions (Signed)
Lasix 20mg  once a day Potassium 59meq once a day Losartan back to 1 tablet a day  Decrease metoprolol to 1/2 tablet twice a day -- let us know if you heart rates don't increase to the 50s .
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
## 2020-08-17 ENCOUNTER — Other Ambulatory Visit: Payer: Self-pay | Admitting: Endocrinology

## 2020-08-20 ENCOUNTER — Other Ambulatory Visit: Payer: Self-pay | Admitting: Internal Medicine

## 2020-08-20 DIAGNOSIS — I2510 Atherosclerotic heart disease of native coronary artery without angina pectoris: Secondary | ICD-10-CM

## 2020-08-24 DIAGNOSIS — I482 Chronic atrial fibrillation, unspecified: Secondary | ICD-10-CM | POA: Diagnosis not present

## 2020-08-24 DIAGNOSIS — I10 Essential (primary) hypertension: Secondary | ICD-10-CM | POA: Diagnosis not present

## 2020-08-24 DIAGNOSIS — E1165 Type 2 diabetes mellitus with hyperglycemia: Secondary | ICD-10-CM | POA: Diagnosis not present

## 2020-08-24 DIAGNOSIS — Z794 Long term (current) use of insulin: Secondary | ICD-10-CM | POA: Diagnosis not present

## 2020-08-28 DIAGNOSIS — J029 Acute pharyngitis, unspecified: Secondary | ICD-10-CM | POA: Diagnosis not present

## 2020-08-28 DIAGNOSIS — K625 Hemorrhage of anus and rectum: Secondary | ICD-10-CM | POA: Diagnosis not present

## 2020-08-28 DIAGNOSIS — R0982 Postnasal drip: Secondary | ICD-10-CM | POA: Diagnosis not present

## 2020-08-31 ENCOUNTER — Other Ambulatory Visit: Payer: Self-pay | Admitting: Internal Medicine

## 2020-08-31 ENCOUNTER — Other Ambulatory Visit: Payer: Self-pay | Admitting: Orthopedic Surgery

## 2020-08-31 DIAGNOSIS — G8929 Other chronic pain: Secondary | ICD-10-CM

## 2020-08-31 DIAGNOSIS — I4819 Other persistent atrial fibrillation: Secondary | ICD-10-CM

## 2020-08-31 DIAGNOSIS — M25562 Pain in left knee: Secondary | ICD-10-CM

## 2020-09-01 ENCOUNTER — Other Ambulatory Visit: Payer: Self-pay | Admitting: Orthopedic Surgery

## 2020-09-01 MED ORDER — APIXABAN 5 MG PO TABS: 5.0000 mg | ORAL_TABLET | Freq: Two times a day (BID) | ORAL | 1 refills | Status: DC

## 2020-09-01 NOTE — Telephone Encounter (Signed)
Eliquis mg refill request received. Patient is 70 years old, weight- 113.3 kg, Crea- 0.85, and last seen by CF on 07/29/20. Dose is appropriate based on dosing criteria. Will send in refill to requested pharmacy.

## 2020-09-01 NOTE — Addendum Note (Signed)
Addended by: Dede Query R on: 09/01/2020 01:44 PM   Modules accepted: Orders

## 2020-09-02 ENCOUNTER — Encounter: Payer: Self-pay | Admitting: Endocrinology

## 2020-09-02 ENCOUNTER — Telehealth (HOSPITAL_COMMUNITY): Payer: Self-pay | Admitting: *Deleted

## 2020-09-02 ENCOUNTER — Ambulatory Visit (INDEPENDENT_AMBULATORY_CARE_PROVIDER_SITE_OTHER): Payer: Medicare Other | Admitting: Endocrinology

## 2020-09-02 ENCOUNTER — Other Ambulatory Visit: Payer: Self-pay

## 2020-09-02 VITALS — BP 152/110 | HR 118 | Ht 71.0 in | Wt 247.4 lb

## 2020-09-02 DIAGNOSIS — Z794 Long term (current) use of insulin: Secondary | ICD-10-CM | POA: Diagnosis not present

## 2020-09-02 DIAGNOSIS — E1165 Type 2 diabetes mellitus with hyperglycemia: Secondary | ICD-10-CM

## 2020-09-02 DIAGNOSIS — I2510 Atherosclerotic heart disease of native coronary artery without angina pectoris: Secondary | ICD-10-CM | POA: Diagnosis not present

## 2020-09-02 LAB — POCT GLYCOSYLATED HEMOGLOBIN (HGB A1C): Hemoglobin A1C: 5.2 % (ref 4.0–5.6)

## 2020-09-02 MED ORDER — OZEMPIC (2 MG/DOSE) 8 MG/3ML ~~LOC~~ SOPN
PEN_INJECTOR | SUBCUTANEOUS | 3 refills | Status: DC
Start: 2020-09-02 — End: 2021-03-17

## 2020-09-02 NOTE — Patient Instructions (Addendum)
Tresiba 20 units, reduce 4 units if  am sugar  stays < 90

## 2020-09-02 NOTE — Telephone Encounter (Signed)
Pt called in today stating he went to his endocrinologist this morning and his HR was in the 110s. He had not taken his morning medications at the time so went home and took those at 130pm - his heart rate currently is 114 BP 158/117 increased short of breath from baseline. Discussed with Adline Peals PA will take an extra 1/2 tab of metoprolol now then if still out this evening take 1 tablet of metoprolol.  Call in AM with update of HRs. Will bring in for assessment if continues in AF.
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
## 2020-09-03 ENCOUNTER — Encounter: Payer: Self-pay | Admitting: Internal Medicine

## 2020-09-03 ENCOUNTER — Other Ambulatory Visit: Payer: Self-pay | Admitting: Internal Medicine

## 2020-09-03 NOTE — Telephone Encounter (Signed)
Pt called with update. This morning his HR is 48-62 BP 137/81 feels back to baseline. Took his normal dose of metoprolol 12.5mg . Will continue current regimen and call if reoccurrence. Pt in agreement.

## 2020-09-18 ENCOUNTER — Observation Stay (HOSPITAL_COMMUNITY)
Admission: AD | Admit: 2020-09-18 | Discharge: 2020-09-19 | Disposition: A | Discharge status: Home / Self Care | Payer: Medicare Other | Source: Ambulatory Visit | Attending: Internal Medicine | Admitting: Internal Medicine

## 2020-09-18 ENCOUNTER — Ambulatory Visit (HOSPITAL_BASED_OUTPATIENT_CLINIC_OR_DEPARTMENT_OTHER)
Admission: RE | Admit: 2020-09-18 | Discharge: 2020-09-18 | Disposition: A | Discharge status: Home / Self Care | Payer: Medicare Other | Source: Ambulatory Visit | Attending: Nurse Practitioner | Admitting: Nurse Practitioner

## 2020-09-18 ENCOUNTER — Other Ambulatory Visit: Payer: Self-pay

## 2020-09-18 ENCOUNTER — Encounter (HOSPITAL_COMMUNITY): Payer: Self-pay | Admitting: Nurse Practitioner

## 2020-09-18 ENCOUNTER — Encounter (HOSPITAL_COMMUNITY): Payer: Self-pay | Admitting: Internal Medicine

## 2020-09-18 ENCOUNTER — Other Ambulatory Visit (HOSPITAL_COMMUNITY): Payer: Self-pay | Admitting: Nurse Practitioner

## 2020-09-18 VITALS — BP 86/62 | HR 31 | Ht 71.0 in | Wt 248.4 lb

## 2020-09-18 DIAGNOSIS — Z8616 Personal history of COVID-19: Secondary | ICD-10-CM | POA: Insufficient documentation

## 2020-09-18 DIAGNOSIS — I4819 Other persistent atrial fibrillation: Secondary | ICD-10-CM | POA: Insufficient documentation

## 2020-09-18 DIAGNOSIS — E119 Type 2 diabetes mellitus without complications: Secondary | ICD-10-CM | POA: Diagnosis not present

## 2020-09-18 DIAGNOSIS — I5032 Chronic diastolic (congestive) heart failure: Secondary | ICD-10-CM | POA: Insufficient documentation

## 2020-09-18 DIAGNOSIS — I959 Hypotension, unspecified: Secondary | ICD-10-CM | POA: Insufficient documentation

## 2020-09-18 DIAGNOSIS — Z85828 Personal history of other malignant neoplasm of skin: Secondary | ICD-10-CM | POA: Diagnosis not present

## 2020-09-18 DIAGNOSIS — Z7901 Long term (current) use of anticoagulants: Secondary | ICD-10-CM | POA: Diagnosis not present

## 2020-09-18 DIAGNOSIS — I495 Sick sinus syndrome: Secondary | ICD-10-CM | POA: Diagnosis not present

## 2020-09-18 DIAGNOSIS — G4733 Obstructive sleep apnea (adult) (pediatric): Secondary | ICD-10-CM | POA: Insufficient documentation

## 2020-09-18 DIAGNOSIS — R001 Bradycardia, unspecified: Principal | ICD-10-CM | POA: Insufficient documentation

## 2020-09-18 DIAGNOSIS — Z8249 Family history of ischemic heart disease and other diseases of the circulatory system: Secondary | ICD-10-CM | POA: Insufficient documentation

## 2020-09-18 DIAGNOSIS — I2510 Atherosclerotic heart disease of native coronary artery without angina pectoris: Secondary | ICD-10-CM | POA: Diagnosis not present

## 2020-09-18 DIAGNOSIS — Z96643 Presence of artificial hip joint, bilateral: Secondary | ICD-10-CM | POA: Diagnosis not present

## 2020-09-18 DIAGNOSIS — Z880 Allergy status to penicillin: Secondary | ICD-10-CM | POA: Insufficient documentation

## 2020-09-18 DIAGNOSIS — Z79899 Other long term (current) drug therapy: Secondary | ICD-10-CM | POA: Diagnosis not present

## 2020-09-18 DIAGNOSIS — Z882 Allergy status to sulfonamides status: Secondary | ICD-10-CM | POA: Insufficient documentation

## 2020-09-18 DIAGNOSIS — I480 Paroxysmal atrial fibrillation: Secondary | ICD-10-CM

## 2020-09-18 DIAGNOSIS — I110 Hypertensive heart disease with heart failure: Secondary | ICD-10-CM | POA: Insufficient documentation

## 2020-09-18 DIAGNOSIS — Z888 Allergy status to other drugs, medicaments and biological substances status: Secondary | ICD-10-CM | POA: Insufficient documentation

## 2020-09-18 DIAGNOSIS — Z20822 Contact with and (suspected) exposure to covid-19: Secondary | ICD-10-CM | POA: Insufficient documentation

## 2020-09-18 LAB — COMPREHENSIVE METABOLIC PANEL
ALT: 16 U/L (ref 0–44)
AST: 22 U/L (ref 15–41)
Albumin: 4 g/dL (ref 3.5–5.0)
Alkaline Phosphatase: 50 U/L (ref 38–126)
Anion gap: 7 (ref 5–15)
BUN: 17 mg/dL (ref 8–23)
CO2: 26 mmol/L (ref 22–32)
Calcium: 9.5 mg/dL (ref 8.9–10.3)
Chloride: 102 mmol/L (ref 98–111)
Creatinine, Ser: 0.83 mg/dL (ref 0.61–1.24)
GFR, Estimated: >60 mL/min (ref >60)
Glucose, Bld: 71 mg/dL (ref 70–99)
Potassium: 4.1 mmol/L (ref 3.5–5.1)
Sodium: 135 mmol/L (ref 135–145)
Total Bilirubin: 0.9 mg/dL (ref 0.3–1.2)
Total Protein: 7 g/dL (ref 6.5–8.1)

## 2020-09-18 LAB — CBC WITH DIFFERENTIAL/PLATELET
Abs Immature Granulocytes: 0 10*3/uL (ref 0.00–0.07)
Basophils Absolute: 0 10*3/uL (ref 0.0–0.1)
Basophils Relative: 1 %
Eosinophils Absolute: 0.1 10*3/uL (ref 0.0–0.5)
Eosinophils Relative: 3 %
HCT: 36.5 % — ABNORMAL LOW (ref 39.0–52.0)
Hemoglobin: 11.7 g/dL — ABNORMAL LOW (ref 13.0–17.0)
Immature Granulocytes: 0 %
Lymphocytes Relative: 43 %
Lymphs Abs: 2.1 10*3/uL (ref 0.7–4.0)
MCH: 28.7 pg (ref 26.0–34.0)
MCHC: 32.1 g/dL (ref 30.0–36.0)
MCV: 89.5 fL (ref 80.0–100.0)
Monocytes Absolute: 0.4 10*3/uL (ref 0.1–1.0)
Monocytes Relative: 9 %
Neutro Abs: 2.1 10*3/uL (ref 1.7–7.7)
Neutrophils Relative %: 44 %
Platelets: 169 10*3/uL (ref 150–400)
RBC: 4.08 MIL/uL — ABNORMAL LOW (ref 4.22–5.81)
RDW: 15.2 % (ref 11.5–15.5)
WBC: 4.7 10*3/uL (ref 4.0–10.5)
nRBC: 0 % (ref 0.0–0.2)

## 2020-09-18 LAB — T4, FREE: Free T4: 0.84 ng/dL (ref 0.61–1.12)

## 2020-09-18 LAB — TSH: TSH: 1.858 u[IU]/mL (ref 0.350–4.500)

## 2020-09-18 LAB — GLUCOSE, CAPILLARY: Glucose-Capillary: 71 mg/dL (ref 70–99)

## 2020-09-18 MED ORDER — MAGNESIUM OXIDE -MG SUPPLEMENT 400 (240 MG) MG PO TABS
200.0000 mg | ORAL_TABLET | Freq: Every day | ORAL | Status: DC
  Administered 2020-09-19: 200 mg via ORAL
  Filled 2020-09-18: qty 0.5
  Filled 2020-09-18: qty 1

## 2020-09-18 MED ORDER — POTASSIUM CHLORIDE CRYS ER 20 MEQ PO TBCR
20.0000 meq | EXTENDED_RELEASE_TABLET | Freq: Two times a day (BID) | ORAL | Status: DC
  Administered 2020-09-18 – 2020-09-19 (×2): 20 meq via ORAL
  Filled 2020-09-18 (×2): qty 1

## 2020-09-18 MED ORDER — BRIMONIDINE TARTRATE 0.025 % OP SOLN: 1.0000 [drp] | Freq: Two times a day (BID) | OPHTHALMIC | Status: DC

## 2020-09-18 MED ORDER — ONDANSETRON HCL 4 MG/2ML IJ SOLN: 4.0000 mg | Freq: Four times a day (QID) | INTRAMUSCULAR | Status: DC | PRN

## 2020-09-18 MED ORDER — METFORMIN HCL 500 MG PO TABS
1000.0000 mg | ORAL_TABLET | Freq: Two times a day (BID) | ORAL | Status: DC
  Administered 2020-09-18 – 2020-09-19 (×2): 1000 mg via ORAL
  Filled 2020-09-18 (×2): qty 2

## 2020-09-18 MED ORDER — BRIMONIDINE TARTRATE 0.15 % OP SOLN
1.0000 [drp] | Freq: Two times a day (BID) | OPHTHALMIC | Status: DC
  Administered 2020-09-18 – 2020-09-19 (×2): 1 [drp] via OPHTHALMIC
  Filled 2020-09-18 (×2): qty 5

## 2020-09-18 MED ORDER — LOSARTAN POTASSIUM 25 MG PO TABS
25.0000 mg | ORAL_TABLET | Freq: Every day | ORAL | Status: DC
  Administered 2020-09-19: 25 mg via ORAL
  Filled 2020-09-18: qty 1

## 2020-09-18 MED ORDER — PANTOPRAZOLE SODIUM 40 MG PO TBEC
40.0000 mg | DELAYED_RELEASE_TABLET | Freq: Every day | ORAL | Status: DC
  Administered 2020-09-19: 40 mg via ORAL
  Filled 2020-09-18: qty 1

## 2020-09-18 MED ORDER — INSULIN DEGLUDEC 200 UNIT/ML ~~LOC~~ SOPN: PEN_INJECTOR | Freq: Every day | SUBCUTANEOUS | Status: DC

## 2020-09-18 MED ORDER — TAMSULOSIN HCL 0.4 MG PO CAPS
0.4000 mg | ORAL_CAPSULE | Freq: Every day | ORAL | Status: DC
  Administered 2020-09-18: 0.4 mg via ORAL
  Filled 2020-09-18: qty 1

## 2020-09-18 MED ORDER — SODIUM CHLORIDE 0.9 % IV SOLN: INTRAVENOUS | Status: DC

## 2020-09-18 MED ORDER — APIXABAN 5 MG PO TABS
5.0000 mg | ORAL_TABLET | Freq: Two times a day (BID) | ORAL | Status: DC
  Administered 2020-09-18: 5 mg via ORAL
  Filled 2020-09-18: qty 1

## 2020-09-18 MED ORDER — ACETAMINOPHEN 325 MG PO TABS: 650.0000 mg | ORAL_TABLET | ORAL | Status: DC | PRN

## 2020-09-18 MED ORDER — LATANOPROST 0.005 % OP SOLN
1.0000 [drp] | Freq: Every day | OPHTHALMIC | Status: DC
  Administered 2020-09-18: 1 [drp] via OPHTHALMIC
  Filled 2020-09-18 (×2): qty 2.5

## 2020-09-18 MED ORDER — CELECOXIB 200 MG PO CAPS
200.0000 mg | ORAL_CAPSULE | Freq: Every day | ORAL | Status: DC
  Administered 2020-09-19: 200 mg via ORAL
  Filled 2020-09-18: qty 1

## 2020-09-18 MED ORDER — EMPAGLIFLOZIN 25 MG PO TABS
25.0000 mg | ORAL_TABLET | Freq: Every day | ORAL | Status: DC
  Administered 2020-09-19: 25 mg via ORAL
  Filled 2020-09-18 (×2): qty 1

## 2020-09-18 MED ORDER — INSULIN GLARGINE-YFGN 100 UNIT/ML ~~LOC~~ SOLN
20.0000 [IU] | Freq: Every day | SUBCUTANEOUS | Status: DC
  Filled 2020-09-18 (×2): qty 0.2

## 2020-09-18 MED ORDER — FUROSEMIDE 20 MG PO TABS
20.0000 mg | ORAL_TABLET | Freq: Every day | ORAL | Status: DC
  Administered 2020-09-19: 20 mg via ORAL
  Filled 2020-09-18: qty 1

## 2020-09-18 MED ORDER — SIMVASTATIN 20 MG PO TABS
20.0000 mg | ORAL_TABLET | Freq: Every day | ORAL | Status: DC
  Administered 2020-09-18 – 2020-09-19 (×2): 20 mg via ORAL
  Filled 2020-09-18 (×2): qty 1
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
## 2020-09-18 NOTE — Plan of Care (Signed)
  Problem: Education: Goal: Knowledge of General Education information will improve Description: Including pain rating scale, medication(s)/side effects and non-pharmacologic comfort measures Outcome: Progressing   Problem: Health Behavior/Discharge Planning: Goal: Ability to manage health-related needs will improve Outcome: Progressing   Problem: Clinical Measurements: Goal: Ability to maintain clinical measurements within normal limits will improve Outcome: Progressing   Problem: Clinical Measurements: Goal: Cardiovascular complication will be avoided Outcome: Progressing   Problem: Safety: Goal: Ability to remain free from injury will improve Outcome: Progressing

## 2020-09-19 ENCOUNTER — Encounter (HOSPITAL_COMMUNITY)
Admission: AD | Disposition: A | Discharge status: Home / Self Care | Payer: Self-pay | Source: Ambulatory Visit | Attending: Internal Medicine

## 2020-09-19 DIAGNOSIS — Z85828 Personal history of other malignant neoplasm of skin: Secondary | ICD-10-CM | POA: Diagnosis not present

## 2020-09-19 DIAGNOSIS — I110 Hypertensive heart disease with heart failure: Secondary | ICD-10-CM | POA: Diagnosis not present

## 2020-09-19 DIAGNOSIS — Z20822 Contact with and (suspected) exposure to covid-19: Secondary | ICD-10-CM | POA: Diagnosis not present

## 2020-09-19 DIAGNOSIS — I5032 Chronic diastolic (congestive) heart failure: Secondary | ICD-10-CM | POA: Diagnosis not present

## 2020-09-19 DIAGNOSIS — Z96643 Presence of artificial hip joint, bilateral: Secondary | ICD-10-CM | POA: Diagnosis not present

## 2020-09-19 DIAGNOSIS — I4819 Other persistent atrial fibrillation: Secondary | ICD-10-CM | POA: Diagnosis not present

## 2020-09-19 DIAGNOSIS — Z79899 Other long term (current) drug therapy: Secondary | ICD-10-CM | POA: Diagnosis not present

## 2020-09-19 DIAGNOSIS — Z7901 Long term (current) use of anticoagulants: Secondary | ICD-10-CM | POA: Diagnosis not present

## 2020-09-19 DIAGNOSIS — Z8616 Personal history of COVID-19: Secondary | ICD-10-CM | POA: Diagnosis not present

## 2020-09-19 DIAGNOSIS — I2510 Atherosclerotic heart disease of native coronary artery without angina pectoris: Secondary | ICD-10-CM | POA: Diagnosis not present

## 2020-09-19 DIAGNOSIS — R001 Bradycardia, unspecified: Secondary | ICD-10-CM | POA: Diagnosis not present

## 2020-09-19 DIAGNOSIS — E119 Type 2 diabetes mellitus without complications: Secondary | ICD-10-CM | POA: Diagnosis not present

## 2020-09-19 LAB — SARS CORONAVIRUS 2 BY RT PCR (HOSPITAL ORDER, PERFORMED IN ~~LOC~~ HOSPITAL LAB): SARS Coronavirus 2: NEGATIVE

## 2020-09-19 LAB — MRSA NEXT GEN BY PCR, NASAL: MRSA by PCR Next Gen: NOT DETECTED

## 2020-09-19 SURGERY — PACEMAKER IMPLANT

## 2020-09-19 MED ORDER — APIXABAN 5 MG PO TABS
5.0000 mg | ORAL_TABLET | Freq: Two times a day (BID) | ORAL | Status: DC
  Administered 2020-09-19: 5 mg via ORAL
  Filled 2020-09-19: qty 1

## 2020-09-19 NOTE — Discharge Summary (Addendum)
DISCHARGE SUMMARY    Patient ID: Lawrence Jordan,  MRN: [id number], DOB/AGE: 02/17/1950 70 y.o.  Admit date: 09/18/2020 Discharge date: 09/19/2020  Primary Care Physician: Bonnita Hollow, MD Primary Cardiologist: Dr. Radford Pax Electrophysiologist: Dr. [name]  Primary Discharge Diagnosis:  Symptomatic bradycardia  Secondary Discharge Diagnosis:  Persistent AFib CHA2DS2Vasc is 4, on Eliquis H.x of prior PVI ablations 2. HTN 3. DM 4. OSA w/CPAP 5. Chronic CHF (diastolic) compensated  Allergies  Allergen Reactions   Iodinated Diagnostic Agents Anaphylaxis   Sulfa Antibiotics Anaphylaxis and Rash   Adhesive [Tape] Other (See Comments)    Blisters PAPER TAPE ONLY   Amoxicillin Hives and Other (See Comments)    Has patient had a PCN reaction causing immediate rash, facial/tongue/throat swelling, SOB or lightheadedness with hypotension: No Has patient had a PCN reaction causing severe rash involving mucus membranes or skin necrosis:Yes--blisters around mouth Has patient had a PCN reaction that required hospitalization: No Has patient had a PCN reaction occurring within the last 10 years: Yes If all of the above answers are "NO", then may proceed with Cephalosporin use.      Procedures This Admission:  None  Brief HPI: Lawrence Jordan is a 70 y.o. male with PMHx including above, reached out to the AFib clinic yesterday with observed brachycardia 30's and feeling weak/tired, unable to  get a BP registered on his home cuff.   Known to have baseline rates generally 40's-40's.  He was brought in and found in SB 31 by EKG with SBP 80's-90.  In d/w Dr. [name] planned for direct admission, stopping his metoprolol and suspect need for pacing.  Hospital Course:  The patient was admitted and monitored on telemetry, his HR improved to the 40's. At rest, some sleeping rates that dipped high 30's.  This morning with recovery of his HR to about his baseline at rest, Dr. [name]  discussed with the patient ambulating, monitoring symptoms, and HR and felt reasonable to hold off pacing if he felt well and HR excursion was adequate and discharge off his betablocker.  He also spoke with the patient that would be reasonable to move forward with PPM implant at this juncture. The patient has been OOB and ambulated without difficulty and without symptoms, HR climbing to 60's and 70's.  I revisited with the patient, answered his follow up questions and at this time, he would like to hold off on pacer, will keep him off his metoprolol and have early follow up with Dr. [name].  The patient feels well, denies any CP/SOB, I have made Dr. [name] aware of plans to discharge and follow up with him next week is in place.  I have discussed with the patient symptoms of bradycardia and if developed to make Lawrence Jordan aware or return.    Physical Exam: Vitals:   09/18/20 2350 09/19/20 0355 09/19/20 0828 09/19/20 1124  BP: 113/60 110/62 123/73 121/70  Pulse: (!) 48 (!) 46 (!) 46 (!) 55  Resp: 19 19 18 18   Temp: 98.2 F (36.8 C) 98.9 F (37.2 C) 97.6 F (36.4 C) 98.3 F (36.8 C)  TempSrc: Oral Axillary Oral Oral  SpO2: 98% 98% 99% 99%  Weight:  108.4 kg    Height:        GEN- The patient is well appearing, alert and oriented x 3 today.   HEENT: normocephalic, atraumatic; sclera clear, conjunctiva pink; hearing intact; oropharynx clear; neck supple, no JVP Lungs- CTA b/l, normal work of breathing.  No wheezes, rales, rhonchi Heart- RRR, bradycardic, no murmurs, rubs or gallops, PMI not laterally displaced GI- soft, non-tender, non-distended Extremities- no clubbing, cyanosis, or edema MS- no significant deformity or atrophy Skin- warm and dry, no rash or lesion Psych- euthymic mood, full affect Neuro- no gross deficits   Labs:   Lab Results  Component Value Date   WBC 4.7 09/18/2020   HGB 11.7 (L) 09/18/2020   HCT 36.5 (L) 09/18/2020   MCV 89.5 09/18/2020   PLT 169 09/18/2020     Recent Labs  Lab 09/18/20 1747  NA 135  K 4.1  CL 102  CO2 26  BUN 17  CREATININE 0.83  CALCIUM 9.5  PROT 7.0  BILITOT 0.9  ALKPHOS 50  ALT 16  AST 22  GLUCOSE 71    Discharge Medications:  Allergies as of 09/19/2020       Reactions   Iodinated Diagnostic Agents Anaphylaxis   Sulfa Antibiotics Anaphylaxis, Rash   Adhesive [tape] Other (See Comments)   Blisters PAPER TAPE ONLY   Amoxicillin Hives, Other (See Comments)   Has patient had a PCN reaction causing immediate rash, facial/tongue/throat swelling, SOB or lightheadedness with hypotension: No Has patient had a PCN reaction causing severe rash involving mucus membranes or skin necrosis:Yes--blisters around mouth Has patient had a PCN reaction that required hospitalization: No Has patient had a PCN reaction occurring within the last 10 years: Yes If all of the above answers are "NO", then may proceed with Cephalosporin use.        Medication List     TAKE these medications    acetaminophen 500 MG tablet Commonly known as: TYLENOL Take 1,000 mg by mouth every 8 (eight) hours as needed for moderate pain.   apixaban 5 MG Tabs tablet Commonly known as: Eliquis Take 1 tablet (5 mg total) by mouth 2 (two) times daily.   BD Pen Needle Nano 2nd Gen 32G X 4 MM Misc Generic drug: Insulin Pen Needle USE PEN NEEDLE AS INSTRUCTED TO INJECT INSULIN 4 TIMES DAILY.   Brimonidine Tartrate 0.025 % Soln Place 1 drop into both eyes 2 (two) times daily.   calcium carbonate 750 MG chewable tablet Commonly known as: TUMS EX Chew 1,500 mg by mouth at bedtime.   CALCIUM-D PO Take 1 tablet by mouth daily.   celecoxib 200 MG capsule Commonly known as: CELEBREX Take 200 mg by mouth daily.   fluticasone 50 MCG/ACT nasal spray Commonly known as: FLONASE Place 2 sprays into both nostrils daily.   furosemide 20 MG tablet Commonly known as: LASIX Take 1 tablet (20 mg total) by mouth daily. May take an extra tablet daily  as needed for weight gain   hydrocortisone 2.5 % cream Apply topically 2 (two) times daily as needed (Rash).   Jardiance 25 MG Tabs tablet Generic drug: empagliflozin TAKE 1 TABLET BY MOUTH EVERY DAY What changed: how much to take   [place]lor-Con M20 20 MEQ tablet Generic drug: potassium chloride SA TAKE 1 TABLET BY MOUTH TWICE A DAY What changed:  how much to take when to take this   latanoprost 0.005 % ophthalmic solution Commonly known as: XALATAN Place 1 drop into both eyes at bedtime.   losartan 25 MG tablet Commonly known as: COZAAR Take 25 mg by mouth daily.   Magnesium 200 MG Tabs Take 1 tablet (200 mg total) by mouth daily.   metFORMIN 1000 MG tablet Commonly known as: GLUCOPHAGE TAKE 1 TABLET (1,000 MG TOTAL) BY MOUTH  2 (TWO) TIMES DAILY WITH A MEAL.   multivitamin tablet Take 1 tablet by mouth daily.   OneTouch Delica Lancets Fine Misc USE TO CHECK BLOOD SUGAR 2 TIMES PER DAY dx code E11.9   OneTouch Verio test strip Generic drug: glucose blood USE ONETOUCH VERIO TEST STRIPS AS INSTRUCTED TO CHECK BLOOD SUGAR 2 TIMES DAILY.DX:E11.65   Ozempic (2 MG/DOSE) 8 MG/3ML Sopn Generic drug: Semaglutide (2 MG/DOSE) Inject 2 mg weekly What changed:  how much to take how to take this when to take this additional instructions   pantoprazole 40 MG tablet Commonly known as: PROTONIX TAKE 1 TABLET BY MOUTH EVERY DAY   simvastatin 20 MG tablet Commonly known as: ZOCOR TAKE 1 TABLET BY MOUTH DAILY AT 6 PM What changed: when to take this   tamsulosin 0.4 MG Caps capsule Commonly known as: FLOMAX Take 0.4 mg by mouth at bedtime.   traMADol-acetaminophen 37.5-325 MG tablet Commonly known as: ULTRACET Take 1 tablet by mouth every 4 (four) hours as needed for moderate pain.   Tyler Aas FlexTouch 200 UNIT/ML FlexTouch Pen Generic drug: insulin degludec Inject 20 Units into the skin every evening.        Disposition: Home Discharge Instructions     Diet - low  sodium heart healthy   Complete by: As directed    Increase activity slowly   Complete by: As directed        Follow-up Information     [name], MD Follow up.   Specialty: Cardiology Why: 09/26/20 @ 11:30AM Contact information: [place] [address] [phone]                 Duration of Discharge Encounter: Greater than 30 minutes including physician time.  Signed, Tommye Standard, PA-C 09/19/2020 11:41 AM  I have seen, examined the patient, and reviewed the above assessment and plan.  Changes to above are made where necessary.  On exam, bradycardic regular rhythm. Clinically improved off of metoprolol.  He does not wish to proceed with PPM at this time.  I will see him in 1-2 weeks for further follow-up.  Co Sign: [name], MD 09/19/2020 4:22 PM

## 2020-09-19 NOTE — Progress Notes (Signed)
Covid swab and MRSA PCR sent to the lab.

## 2020-09-19 NOTE — Progress Notes (Signed)
   09/19/20 0920  Clinical Encounter Type  Visited With Patient  Visit Type Initial  Referral From Nurse  Consult/Referral To Chaplain   Chaplain responded to Advance Directive consult. Pt was resting upon arrival, so Chaplain provided brief AD education. Pt mentioned he wanted his wife to be his healthcare agent. Chaplain explained how to reach out if Pt has any questions after going through the paperwork. Chaplain remains available.   This note was prepared by Chaplain Resident, Dante Gang, MDiv. Chaplain remains available as needed through the on-call pager: [phone].

## 2020-09-24 DIAGNOSIS — Z794 Long term (current) use of insulin: Secondary | ICD-10-CM | POA: Diagnosis not present

## 2020-09-24 DIAGNOSIS — I482 Chronic atrial fibrillation, unspecified: Secondary | ICD-10-CM | POA: Diagnosis not present

## 2020-09-24 DIAGNOSIS — I10 Essential (primary) hypertension: Secondary | ICD-10-CM | POA: Diagnosis not present

## 2020-09-24 DIAGNOSIS — E1165 Type 2 diabetes mellitus with hyperglycemia: Secondary | ICD-10-CM | POA: Diagnosis not present

## 2020-09-26 ENCOUNTER — Other Ambulatory Visit: Payer: Self-pay

## 2020-09-26 ENCOUNTER — Ambulatory Visit (INDEPENDENT_AMBULATORY_CARE_PROVIDER_SITE_OTHER): Payer: Medicare Other | Admitting: Internal Medicine

## 2020-09-26 ENCOUNTER — Encounter: Payer: Self-pay | Admitting: Internal Medicine

## 2020-09-26 VITALS — BP 130/78 | HR 40 | Ht 71.0 in | Wt 245.0 lb

## 2020-09-26 DIAGNOSIS — I4819 Other persistent atrial fibrillation: Secondary | ICD-10-CM | POA: Diagnosis not present

## 2020-09-26 DIAGNOSIS — G4733 Obstructive sleep apnea (adult) (pediatric): Secondary | ICD-10-CM | POA: Diagnosis not present

## 2020-09-26 DIAGNOSIS — I10 Essential (primary) hypertension: Secondary | ICD-10-CM | POA: Diagnosis not present

## 2020-09-26 DIAGNOSIS — D6869 Other thrombophilia: Secondary | ICD-10-CM

## 2020-09-26 DIAGNOSIS — I495 Sick sinus syndrome: Secondary | ICD-10-CM

## 2020-09-26 DIAGNOSIS — R001 Bradycardia, unspecified: Secondary | ICD-10-CM | POA: Diagnosis not present

## 2020-09-26 DIAGNOSIS — I2510 Atherosclerotic heart disease of native coronary artery without angina pectoris: Secondary | ICD-10-CM | POA: Diagnosis not present

## 2020-09-26 NOTE — Patient Instructions (Addendum)
Medication Instructions:  Continue all current medications.  Labwork: none  Testing/Procedures: Your physician has recommended that you have a pacemaker inserted. A pacemaker is a small device that is placed under the skin of your chest or abdomen to help control abnormal heart rhythms. This device uses electrical pulses to prompt the heart to beat at a normal rate. Pacemakers are used to treat heart rhythms that are too slow. Wire (leads) are attached to the pacemaker that goes into the chambers of you heart. This is done in the hospital and usually requires and overnight stay. Please see the instruction sheet given to you today for more information - SCHEDULED FOR 12/02/2020 Lawrence Jordan from [facility] office will contact you with the details.   Follow-Up: Pending  Any Other Special Instructions Will Be Listed Below (If Applicable).   If you need a refill on your cardiac medications before your next appointment, please call your pharmacy.
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
## 2020-10-02 ENCOUNTER — Telehealth: Payer: Self-pay

## 2020-10-02 ENCOUNTER — Other Ambulatory Visit: Payer: Self-pay | Admitting: Dermatology

## 2020-10-02 DIAGNOSIS — L309 Dermatitis, unspecified: Secondary | ICD-10-CM

## 2020-10-02 DIAGNOSIS — R001 Bradycardia, unspecified: Secondary | ICD-10-CM

## 2020-10-02 DIAGNOSIS — Z01818 Encounter for other preprocedural examination: Secondary | ICD-10-CM

## 2020-10-02 NOTE — Telephone Encounter (Signed)
[name], MD sent to [name], RN Please schedule for [facility] implant on November 8th.   Thanks!

## 2020-10-04 ENCOUNTER — Other Ambulatory Visit: Payer: Self-pay | Admitting: Endocrinology

## 2020-10-06 NOTE — Telephone Encounter (Addendum)
Scheduled procedure. Patient picked lab date and will meet with me for instructions for procedure and scrub. Verbalized understanding

## 2020-10-06 NOTE — Telephone Encounter (Signed)
Pt returning call to [place] from earlier today. Please advise pt further

## 2020-10-06 NOTE — Telephone Encounter (Signed)
Left message to call back  

## 2020-10-08 ENCOUNTER — Ambulatory Visit (INDEPENDENT_AMBULATORY_CARE_PROVIDER_SITE_OTHER): Payer: Medicare Other | Admitting: Internal Medicine

## 2020-10-08 ENCOUNTER — Telehealth: Payer: Self-pay | Admitting: *Deleted

## 2020-10-08 ENCOUNTER — Other Ambulatory Visit: Payer: Self-pay

## 2020-10-08 ENCOUNTER — Encounter: Payer: Self-pay | Admitting: Internal Medicine

## 2020-10-08 VITALS — BP 134/79 | HR 49 | Temp 97.7°F | Ht 70.0 in | Wt 248.4 lb

## 2020-10-08 DIAGNOSIS — K219 Gastro-esophageal reflux disease without esophagitis: Secondary | ICD-10-CM

## 2020-10-08 DIAGNOSIS — K922 Gastrointestinal hemorrhage, unspecified: Secondary | ICD-10-CM
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
## 2020-10-08 NOTE — Patient Instructions (Signed)
We will schedule you for colonoscopy today to further evaluate your rectal bleeding.  At the same time we will perform upper endoscopy to evaluate you for Barrett's esophagus.  You will need to hold your Eliquis for 48 hours prior to these procedures.  We will reach out to your cardiologist for their blessing in this matter.  Continue on pantoprazole for your chronic reflux.  It was very nice meeting you today.  Go [facility]  Dr. Abbey Chatters  Lifestyle and home remedies TO MANAGE REFLUX/HEARTBURN    You may eliminate or reduce the frequency of heartburn by making the following lifestyle changes:   Control your weight. Being overweight is a major risk factor for heartburn and GERD. Excess pounds put pressure on your abdomen, pushing up your stomach and causing acid to back up into your esophagus.    Eat smaller meals. 4 TO 6 MEALS A DAY. This reduces pressure on the lower esophageal sphincter, helping to prevent the valve from opening and acid from washing back into your esophagus.     Loosen your belt. Clothes that fit tightly around your waist put pressure on your abdomen and the lower esophageal sphincter.     Eliminate heartburn triggers. Everyone has specific triggers. Common triggers such as fatty or fried foods, spicy food, tomato sauce, carbonated beverages, alcohol, chocolate, mint, garlic, onion, caffeine and nicotine may make heartburn worse.    Avoid stooping or bending. Tying your shoes is OK. Bending over for longer periods to weed your garden isn't, especially soon after eating.    Don't lie down after a meal. Wait at least three to four hours after eating before going to bed, and don't lie down right after eating.   At [facility] we value your feedback. You may receive a survey about your visit today. Please share your experience as we strive to create trusting relationships with our patients to provide genuine, compassionate, quality care.  We  appreciate your understanding and patience as we review any laboratory studies, imaging, and other diagnostic tests that are ordered as we care for you. Our office policy is 5 business days for review of these results, and any emergent or urgent results are addressed in a timely manner for your best interest. If you do not hear from our office in 1 week, please contact us.   We also encourage the use of MyChart, which contains your medical information for your review as well. If you are not enrolled in this feature, an access code is on this after visit summary for your convenience. Thank you for allowing [place] to be involved in your care.  It was great to see you today!  I hope you have a great rest of your summer!!    Elon Alas. Abbey Chatters, D.O. Gastroenterology and Hepatology [facility]

## 2020-10-08 NOTE — Telephone Encounter (Signed)
   [facility] Pre-operative Risk Assessment    Patient Name: Lawrence Jordan  DOB: December 27, 1950 MRN: [id number]  HEARTCARE STAFF:  - IMPORTANT!!!!!! Under Visit Info/Reason for Call, type in Other and utilize the format Clearance MM/DD/YY or Clearance TBD. Do not use dashes or single digits. - Please review there is not already an duplicate clearance open for this procedure. - If request is for dental extraction, please clarify the # of teeth to be extracted. - If the patient is currently at the dentist's office, call Pre-Op Callback Staff (MA/nurse) to input urgent request.  - If the patient is not currently in the dentist office, please route to the Pre-Op pool.  Request for surgical clearance:  What type of surgery is being performed? EGD/Colonoscopy ASA 3  When is this surgery scheduled? TBD  What type of clearance is required (medical clearance vs. Pharmacy clearance to hold med vs. Both)? Both  Are there any medications that need to be held prior to surgery and how long? Eliquis  Practice name and name of physician performing surgery? [facility], Dr Abbey Chatters  What is the office phone number? [phone]   7.   What is the office fax number? [phone]  8.   Anesthesia type (None, local, MAC, general) ? Propofol   Aleric Froelich L 10/08/2020, 4:14 PM  _________________________________________________________________   (provider comments below)

## 2020-10-09 ENCOUNTER — Encounter: Payer: Self-pay | Admitting: *Deleted

## 2020-10-09 NOTE — Telephone Encounter (Signed)
   Patient Name: Lawrence Jordan  DOB: [birth year] MRN: [id number]  Primary Cardiologist: None  Chart reviewed as part of pre-operative protocol coverage.  Patient is requesting preoperative clearance for EGD/colonoscopy.  Patient last seen in the office 09/26/2020 and with recommendation for pacemaker implantation 12/02/2020.  Sending to Dr. [name] for recommendations regarding timing of EGD/colonoscopy with respect to upcoming 11/2020 PPM implantation.  Patient is also on Eliquis 5 mg twice daily and will forward to pharmacy regarding recommendations for anticoagulation.   Arvil Chaco, PA-C 10/09/2020, 11:51 AM

## 2020-10-10 NOTE — Telephone Encounter (Signed)
Patient with diagnosis of Afib on Eliquis for anticoagulation.    Procedure: EGD/Colonoscopy ASA 3 Date of procedure: TBD  CHA2DS2-VASc Score = 5   This indicates a 7.2% annual risk of stroke. The patient's score is based upon: CHF History: 1 HTN History: 1 Diabetes History: 1 Stroke History: 0 Vascular Disease History: 1 Age Score: 1 Gender Score: 0    CrCl 105 ml/min  Per office protocol, patient can hold Eliquis for 1- 2 days prior to procedure.

## 2020-10-12 ENCOUNTER — Other Ambulatory Visit: Payer: Self-pay | Admitting: Endocrinology

## 2020-10-13 NOTE — Telephone Encounter (Signed)
Product Backordered/Unavailable:HAVE TRULICITY.

## 2020-10-14 ENCOUNTER — Telehealth: Payer: Self-pay

## 2020-10-14 NOTE — Telephone Encounter (Signed)
Ramond Dial, RPH-CPP   Pharmacist  Specialty:  Pharmacist  Telephone Encounter  Signed  Encounter Date:  10/08/2020           Signed                                                                                                            Patient with diagnosis of Afib on Eliquis for anticoagulation.     Procedure: EGD/Colonoscopy ASA 3 Date of procedure: TBD   CHA2DS2-VASc Score = 5   This indicates a 7.2% annual risk of stroke. The patient's score is based upon: CHF History: 1 HTN History: 1 Diabetes History: 1 Stroke History: 0 Vascular Disease History: 1 Age Score: 1 Gender Score: 0    CrCl 105 ml/min   Per office protocol, patient can hold Eliquis for 1- 2 days prior to procedure.              Electronically signed by Marcelle Overlie D, RPH-CPP at 10/10/2020  1:47 PM

## 2020-10-14 NOTE — Telephone Encounter (Signed)
Vm left for patient to callback to info that we have a sample

## 2020-10-15 MED ORDER — PEG 3350-KCL-NA BICARB-NACL 420 G PO SOLR: 4000.0000 mL | ORAL | 0 refills | Status: DC

## 2020-10-15 NOTE — Telephone Encounter (Signed)
Called pt, TCS/EGD scheduled for 11/17/20 at 10:15am. Advised pt to hold Eliquis for 48 hours before procedure. Rx for prep sent to pharmacy. Orders entered.

## 2020-10-15 NOTE — Addendum Note (Signed)
Addended by: Hassan Rowan on: 10/15/2020 02:03 PM   Modules accepted: Orders

## 2020-10-15 NOTE — Telephone Encounter (Signed)
Tried to call pt, LMOVM for return call. 

## 2020-10-16 ENCOUNTER — Other Ambulatory Visit: Payer: Self-pay | Admitting: Endocrinology

## 2020-10-16 NOTE — Telephone Encounter (Signed)
Pre-op appt 11/12/20. Appt letter mailed with procedure instructions.

## 2020-10-19 DIAGNOSIS — Z09 Encounter for follow-up examination after completed treatment for conditions other than malignant neoplasm: Secondary | ICD-10-CM | POA: Diagnosis not present

## 2020-10-19 DIAGNOSIS — T753XXA Motion sickness, initial encounter: Secondary | ICD-10-CM | POA: Diagnosis not present

## 2020-10-19 DIAGNOSIS — M545 Low back pain, unspecified: Secondary | ICD-10-CM | POA: Diagnosis not present

## 2020-10-19 DIAGNOSIS — G8929 Other chronic pain: Secondary | ICD-10-CM | POA: Diagnosis not present

## 2020-10-20 NOTE — Telephone Encounter (Signed)
Covering preop box today. Awaiting reply from Dr. [name] (see J. Visser's note below).

## 2020-10-21 NOTE — Telephone Encounter (Signed)
His bradycardia is stable.  Ok to proceed with EKG prior to [facility] implant if medically necessary.

## 2020-10-22 ENCOUNTER — Other Ambulatory Visit (HOSPITAL_COMMUNITY): Payer: Self-pay | Admitting: Nurse Practitioner

## 2020-10-22 NOTE — Telephone Encounter (Signed)
    Patient Name: Lawrence Jordan  DOB: [birth year] MRN: [id number]  Primary Cardiologist: [name], MD  Chart reviewed as part of pre-operative protocol coverage. Per Dr. [name], Lawrence Jordan would be at acceptable risk for the planned procedure without further cardiovascular testing.   Per pharmacy's recommendation patient can hold eliquis 1-2 days prior to his upcoming procedure with plans to restart when cleared to do so by his gastroenterologist.   I will route this recommendation to the requesting party via [place] fax function and remove from pre-op pool.  Please call with questions.  Abigail Butts, PA-C 10/22/2020, 8:13 AM

## 2020-10-24 DIAGNOSIS — I10 Essential (primary) hypertension: Secondary | ICD-10-CM | POA: Diagnosis not present

## 2020-10-24 DIAGNOSIS — E1165 Type 2 diabetes mellitus with hyperglycemia: Secondary | ICD-10-CM | POA: Diagnosis not present

## 2020-10-24 DIAGNOSIS — I482 Chronic atrial fibrillation, unspecified: Secondary | ICD-10-CM | POA: Diagnosis not present

## 2020-10-24 DIAGNOSIS — Z794 Long term (current) use of insulin: Secondary | ICD-10-CM | POA: Diagnosis not present

## 2020-10-27 ENCOUNTER — Ambulatory Visit (INDEPENDENT_AMBULATORY_CARE_PROVIDER_SITE_OTHER): Payer: Medicare Other | Admitting: Orthopedic Surgery

## 2020-10-27 ENCOUNTER — Encounter: Payer: Self-pay | Admitting: Orthopedic Surgery

## 2020-10-27 ENCOUNTER — Other Ambulatory Visit: Payer: Self-pay

## 2020-10-27 VITALS — BP 137/67 | HR 46 | Ht 71.0 in | Wt 248.2 lb

## 2020-10-27 DIAGNOSIS — G8929 Other chronic pain: Secondary | ICD-10-CM

## 2020-10-27 DIAGNOSIS — I2510 Atherosclerotic heart disease of native coronary artery without angina pectoris: Secondary | ICD-10-CM | POA: Diagnosis not present

## 2020-10-27 DIAGNOSIS — H401222 Low-tension glaucoma, left eye, moderate stage: Secondary | ICD-10-CM | POA: Diagnosis not present

## 2020-10-27 DIAGNOSIS — H25813 Combined forms of age-related cataract, bilateral: Secondary | ICD-10-CM | POA: Diagnosis not present

## 2020-10-27 DIAGNOSIS — H401213 Low-tension glaucoma, right eye, severe stage: Secondary | ICD-10-CM | POA: Diagnosis not present

## 2020-10-27 DIAGNOSIS — M25562 Pain in left knee: Secondary | ICD-10-CM | POA: Diagnosis not present

## 2020-10-27 NOTE — Progress Notes (Signed)
Chief Complaint  Patient presents with   Knee Pain    L/ knee is tender today.   Handlin is back to check on his left knee.  He says he can walk 5 miles a day but gets some tenderness after he gets to about 3-1/2 miles  He is lost 100 pounds in about 15 months  He still has some discomfort in the left knee but he is scheduled to have a pacemaker placed soon he is diabetic and he is trying to do something other than knee replacement surgery  I agree with his assessment  We have agreed to give him an injection in his left knee  Left knee injection.  The patient gave consent for left knee injection Timeout confirmed the knee as the left knee.  We cleaned the knee with alcohol and sprayed ethyl chloride then injected 6 mg of Celestone with 4 cc 1% lidocaine  Follow-up 6 months new x-rays

## 2020-11-10 ENCOUNTER — Telehealth: Payer: Self-pay | Admitting: *Deleted

## 2020-11-10 ENCOUNTER — Encounter (HOSPITAL_COMMUNITY): Payer: Self-pay | Admitting: Anesthesiology

## 2020-11-10 ENCOUNTER — Other Ambulatory Visit: Payer: Self-pay

## 2020-11-10 ENCOUNTER — Other Ambulatory Visit: Payer: Medicare Other | Admitting: *Deleted

## 2020-11-10 DIAGNOSIS — Z01818 Encounter for other preprocedural examination: Secondary | ICD-10-CM | POA: Diagnosis not present

## 2020-11-10 DIAGNOSIS — R001 Bradycardia, unspecified: Secondary | ICD-10-CM

## 2020-11-10 LAB — CBC WITH DIFFERENTIAL/PLATELET
Basophils Absolute: 0 10*3/uL (ref 0.0–0.2)
Basos: 1 %
EOS (ABSOLUTE): 0.1 10*3/uL (ref 0.0–0.4)
Eos: 3 %
Hematocrit: 37.3 % — ABNORMAL LOW (ref 37.5–51.0)
Hemoglobin: 12 g/dL — ABNORMAL LOW (ref 13.0–17.7)
Immature Grans (Abs): 0 10*3/uL (ref 0.0–0.1)
Immature Granulocytes: 0 %
Lymphocytes Absolute: 1.6 10*3/uL (ref 0.7–3.1)
Lymphs: 37 %
MCH: 26.9 pg (ref 26.6–33.0)
MCHC: 32.2 g/dL (ref 31.5–35.7)
MCV: 84 fL (ref 79–97)
Monocytes Absolute: 0.4 10*3/uL (ref 0.1–0.9)
Monocytes: 8 %
Neutrophils Absolute: 2.2 10*3/uL (ref 1.4–7.0)
Neutrophils: 51 %
Platelets: 202 10*3/uL (ref 150–450)
RBC: 4.46 x10E6/uL (ref 4.14–5.80)
RDW: 14.6 % (ref 11.6–15.4)
WBC: 4.4 10*3/uL (ref 3.4–10.8)

## 2020-11-10 LAB — BASIC METABOLIC PANEL
BUN/Creatinine Ratio: 21 (ref 10–24)
BUN: 15 mg/dL (ref 8–27)
CO2: 24 mmol/L (ref 20–29)
Calcium: 9.8 mg/dL (ref 8.6–10.2)
Chloride: 104 mmol/L (ref 96–106)
Creatinine, Ser: 0.73 mg/dL — ABNORMAL LOW (ref 0.76–1.27)
Glucose: 88 mg/dL (ref 70–99)
Potassium: 4.3 mmol/L (ref 3.5–5.2)
Sodium: 141 mmol/L (ref 134–144)
eGFR: 98 mL/min/{1.73_m2} (ref >59)

## 2020-11-10 NOTE — Telephone Encounter (Signed)
-----   Message from [name], DO sent at 11/10/2020  1:05 PM EDT ----- Regarding: RE: Patient is having pacemaker implanted on 12/02/2020 Okay Emerick Weatherly lets reschedule ----- Message ----- From: Wilmer Floor, RN Sent: 11/10/2020   1:05 PM EDT To: Minaal Struckman S Elfa Wooton, CMA, [name], DO Subject: Patient is having pacemaker implanted on 11/#  Hey Dr Abbey Chatters,  Dr Charna Elizabeth would like to wait and do Mr NPHQN'E EGD/colonoscopy after he has his pacemaker (for Bradycardia) implanted on 12/02/2020.    His last EKG HR 40.  Thanks,  Rosalyn Gess RN

## 2020-11-10 NOTE — Telephone Encounter (Signed)
Called pt to reschedule. He wants to hold off on rescheduling at this time. He wants to wait and see how he does with the pacemaker. He will call us to reschedule when he is ready. FYI to Dr. Abbey Chatters

## 2020-11-11 DIAGNOSIS — N400 Enlarged prostate without lower urinary tract symptoms: Secondary | ICD-10-CM | POA: Diagnosis not present

## 2020-11-11 DIAGNOSIS — I10 Essential (primary) hypertension: Secondary | ICD-10-CM | POA: Diagnosis not present

## 2020-11-12 ENCOUNTER — Encounter (HOSPITAL_COMMUNITY): Admission: RE | Admit: 2020-11-12 | Payer: Medicare Other | Source: Ambulatory Visit

## 2020-11-17 ENCOUNTER — Encounter (HOSPITAL_COMMUNITY): Admission: RE | Payer: Self-pay | Source: Home / Self Care

## 2020-11-17 ENCOUNTER — Ambulatory Visit (HOSPITAL_COMMUNITY): Admission: RE | Admit: 2020-11-17 | Payer: Medicare Other | Source: Home / Self Care

## 2020-11-17 SURGERY — COLONOSCOPY WITH PROPOFOL
Anesthesia: Monitor Anesthesia Care

## 2020-11-24 DIAGNOSIS — I10 Essential (primary) hypertension: Secondary | ICD-10-CM | POA: Diagnosis not present

## 2020-11-24 DIAGNOSIS — I482 Chronic atrial fibrillation, unspecified: Secondary | ICD-10-CM | POA: Diagnosis not present

## 2020-11-24 DIAGNOSIS — E1165 Type 2 diabetes mellitus with hyperglycemia: Secondary | ICD-10-CM | POA: Diagnosis not present

## 2020-11-24 DIAGNOSIS — Z794 Long term (current) use of insulin: Secondary | ICD-10-CM | POA: Diagnosis not present

## 2020-11-28 ENCOUNTER — Ambulatory Visit: Payer: Medicare Other | Admitting: Internal Medicine

## 2020-12-02 ENCOUNTER — Ambulatory Visit (HOSPITAL_COMMUNITY)
Admission: RE | Admit: 2020-12-02 | Discharge: 2020-12-02 | Disposition: A | Discharge status: Home / Self Care | Payer: Medicare Other | Attending: Internal Medicine | Admitting: Internal Medicine

## 2020-12-02 ENCOUNTER — Ambulatory Visit (HOSPITAL_COMMUNITY): Payer: Medicare Other

## 2020-12-02 ENCOUNTER — Encounter (HOSPITAL_COMMUNITY)
Admission: RE | Disposition: A | Discharge status: Home / Self Care | Payer: Self-pay | Source: Home / Self Care | Attending: Internal Medicine

## 2020-12-02 ENCOUNTER — Encounter (HOSPITAL_COMMUNITY): Payer: Self-pay | Admitting: Internal Medicine

## 2020-12-02 DIAGNOSIS — E119 Type 2 diabetes mellitus without complications: Secondary | ICD-10-CM | POA: Diagnosis not present

## 2020-12-02 DIAGNOSIS — Z950 Presence of cardiac pacemaker: Secondary | ICD-10-CM | POA: Diagnosis not present

## 2020-12-02 DIAGNOSIS — I495 Sick sinus syndrome: Secondary | ICD-10-CM | POA: Insufficient documentation

## 2020-12-02 DIAGNOSIS — J811 Chronic pulmonary edema: Secondary | ICD-10-CM | POA: Diagnosis not present

## 2020-12-02 HISTORY — PX: PACEMAKER IMPLANT: EP1218

## 2020-12-02 LAB — GLUCOSE, CAPILLARY
Glucose-Capillary: 83 mg/dL (ref 70–99)
Glucose-Capillary: 89 mg/dL (ref 70–99)

## 2020-12-02 SURGERY — PACEMAKER IMPLANT

## 2020-12-02 MED ORDER — MIDAZOLAM HCL 5 MG/5ML IJ SOLN
INTRAMUSCULAR | Status: DC | PRN
  Administered 2020-12-02 (×2): .5 mg via INTRAVENOUS
  Administered 2020-12-02 (×2): 1 mg via INTRAVENOUS

## 2020-12-02 MED ORDER — VANCOMYCIN HCL 1500 MG/300ML IV SOLN
1500.0000 mg | INTRAVENOUS | Status: AC
  Administered 2020-12-02: 1500 mg via INTRAVENOUS
  Filled 2020-12-02: qty 300

## 2020-12-02 MED ORDER — DIPHENHYDRAMINE HCL 50 MG/ML IJ SOLN
25.0000 mg | Freq: Once | INTRAMUSCULAR | Status: AC
  Administered 2020-12-02: 25 mg via INTRAVENOUS
  Filled 2020-12-02: qty 1

## 2020-12-02 MED ORDER — SODIUM CHLORIDE 0.9 % IV SOLN
INTRAVENOUS | Status: AC
  Filled 2020-12-02: qty 2

## 2020-12-02 MED ORDER — SODIUM CHLORIDE 0.9 % IV SOLN: 250.0000 mL | INTRAVENOUS | Status: DC | PRN

## 2020-12-02 MED ORDER — CHLORHEXIDINE GLUCONATE 4 % EX LIQD
4.0000 "application " | Freq: Once | CUTANEOUS | Status: DC
  Filled 2020-12-02: qty 60

## 2020-12-02 MED ORDER — POVIDONE-IODINE 10 % EX SWAB
2.0000 "application " | Freq: Once | CUTANEOUS | Status: AC
  Administered 2020-12-02: 2 via TOPICAL

## 2020-12-02 MED ORDER — SODIUM CHLORIDE 0.9 % IV SOLN: INTRAVENOUS | Status: DC

## 2020-12-02 MED ORDER — ACETAMINOPHEN 325 MG PO TABS
325.0000 mg | ORAL_TABLET | ORAL | Status: DC | PRN
  Filled 2020-12-02: qty 2

## 2020-12-02 MED ORDER — SODIUM CHLORIDE 0.9 % IV SOLN
80.0000 mg | INTRAVENOUS | Status: AC
  Administered 2020-12-02: 80 mg
  Filled 2020-12-02: qty 2

## 2020-12-02 MED ORDER — HEPARIN (PORCINE) IN NACL 1000-0.9 UT/500ML-% IV SOLN
INTRAVENOUS | Status: DC | PRN
  Administered 2020-12-02: 500 mL

## 2020-12-02 MED ORDER — ONDANSETRON HCL 4 MG/2ML IJ SOLN: 4.0000 mg | Freq: Four times a day (QID) | INTRAMUSCULAR | Status: DC | PRN

## 2020-12-02 MED ORDER — MIDAZOLAM HCL 5 MG/5ML IJ SOLN
INTRAMUSCULAR | Status: AC
  Filled 2020-12-02: qty 5

## 2020-12-02 MED ORDER — LIDOCAINE HCL (PF) 1 % IJ SOLN
INTRAMUSCULAR | Status: DC | PRN
  Administered 2020-12-02: 60 mL

## 2020-12-02 MED ORDER — FAMOTIDINE IN NACL 20-0.9 MG/50ML-% IV SOLN
20.0000 mg | Freq: Once | INTRAVENOUS | Status: AC
  Administered 2020-12-02: 20 mg via INTRAVENOUS
  Filled 2020-12-02: qty 50

## 2020-12-02 MED ORDER — FENTANYL CITRATE (PF) 100 MCG/2ML IJ SOLN
INTRAMUSCULAR | Status: AC
  Filled 2020-12-02: qty 2

## 2020-12-02 MED ORDER — SODIUM CHLORIDE 0.9% FLUSH: 3.0000 mL | INTRAVENOUS | Status: DC | PRN

## 2020-12-02 MED ORDER — HEPARIN (PORCINE) IN NACL 1000-0.9 UT/500ML-% IV SOLN
INTRAVENOUS | Status: AC
  Filled 2020-12-02: qty 500

## 2020-12-02 MED ORDER — SODIUM CHLORIDE 0.9% FLUSH: 3.0000 mL | Freq: Two times a day (BID) | INTRAVENOUS | Status: DC

## 2020-12-02 MED ORDER — LIDOCAINE HCL (PF) 1 % IJ SOLN
INTRAMUSCULAR | Status: AC
  Filled 2020-12-02: qty 60

## 2020-12-02 MED ORDER — FENTANYL CITRATE (PF) 100 MCG/2ML IJ SOLN
INTRAMUSCULAR | Status: DC | PRN
  Administered 2020-12-02 (×2): 12.5 ug via INTRAVENOUS

## 2020-12-02 MED ORDER — METHYLPREDNISOLONE SODIUM SUCC 125 MG IJ SOLR
125.0000 mg | Freq: Once | INTRAMUSCULAR | Status: AC
  Administered 2020-12-02: 125 mg via INTRAVENOUS
  Filled 2020-12-02: qty 2

## 2020-12-02 SURGICAL SUPPLY — 10 items
CABLE SURGICAL S-101-97-12 (CABLE) ×2 IMPLANT
KIT MICROPUNCTURE NIT STIFF (SHEATH) ×2 IMPLANT
LEAD TENDRIL MRI 52CM LPA1200M (Lead) ×2 IMPLANT
LEAD TENDRIL MRI 58CM LPA1200M (Lead) ×2 IMPLANT
MAT PREVALON FULL STRYKER (MISCELLANEOUS) ×2 IMPLANT
PACEMAKER ASSURITY DR-RF (Pacemaker) ×2 IMPLANT
PAD PRO RADIOLUCENT 2001M-C (PAD) ×2 IMPLANT
SHEATH 8FR PRELUDE SNAP 13 (SHEATH) ×4 IMPLANT
SHEATH PROBE COVER 6X72 (BAG) ×2 IMPLANT
TRAY PACEMAKER INSERTION (PACKS) ×2 IMPLANT
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
## 2020-12-02 NOTE — Discharge Instructions (Addendum)
  Ok to resume eliquis on 12/05/20 with pm dose            Supplemental Discharge Instructions for  Pacemaker/Defibrillator Patients  Tomorrow, 12/03/20, send in a device transmission  Activity No heavy lifting or vigorous activity with your left/right arm for 6 to 8 weeks.  Do not raise your left/right arm above your head for one week.  Gradually raise your affected arm as drawn below.             12/07/20                   12/08/20                    12/09/20                 12/10/20 __  NO DRIVING until cleared to at your wound check visit.  WOUND CARE Keep the wound area clean and dry.  Do not get this area wet , no showers until cleared to at your wound check visit. Tomorrow, 12/03/20, remove the arm sling Tomorrow, 12/03/20 remove the outer plastic bandage.  Underneath the plastic bandage there are steri strips (paper tapes), DO NOT remove these. The tape/steri-strips on your wound will fall off; do not pull them off.  No bandage is needed on the site.  DO  NOT apply any creams, oils, or ointments to the wound area. If you notice any drainage or discharge from the wound, any swelling or bruising at the site, or you develop a fever > 101? F after you are discharged home, call the office at once.  Special Instructions You are still able to use cellular telephones; use the ear opposite the side where you have your pacemaker/defibrillator.  Avoid carrying your cellular phone near your device. When traveling through airports, show security personnel your identification card to avoid being screened in the metal detectors.  Ask the security personnel to use the hand wand. Avoid arc welding equipment, MRI testing (magnetic resonance imaging), TENS units (transcutaneous nerve stimulators).  Call the office for questions about other devices. Avoid electrical appliances that are in poor condition or are not properly grounded. Microwave ovens are safe to be near or to operate.

## 2020-12-03 ENCOUNTER — Ambulatory Visit: Payer: Medicare Other | Admitting: Endocrinology

## 2020-12-10 ENCOUNTER — Telehealth (HOSPITAL_COMMUNITY): Payer: Self-pay | Admitting: *Deleted

## 2020-12-10 MED ORDER — METOPROLOL TARTRATE 25 MG PO TABS: 25.0000 mg | ORAL_TABLET | Freq: Two times a day (BID) | ORAL | 3 refills | Status: DC

## 2020-12-10 NOTE — Telephone Encounter (Signed)
Patient just "washed out" now but no chest pain/pressure currently. BP 111/81. Per Adline Peals PA will take an extra 12.5mg  now and 25mg  this evening. Pt preferred to take it easy this afternoon no office visit (has one in place for Friday).  Pt will call in the morning with an update of his symptoms/rates.  ER precautions reviewed with patient should symptoms worsen. Pt verbalized agreement.

## 2020-12-10 NOTE — Telephone Encounter (Signed)
Spoke with pt who complains of elevated heart rate, BP, mild SOB, occasional dizziness and mild  chest discomfort across his chest.  Denies N&V or radiation.  See also advice request note.  Pt states he has already spoken with [facility] and advised.  See note below.    Lawrence Mire, RN 11:25 AM Note Patient called in stating he woke up in AF this morning HR 61-150 currently 121. BP 163/115. Discussed with Lawrence Peals PA will resume metoprolol tartrate 25mg  twice a day. He states he feels as though he is holding on to fluid as well he will take an extra 20mg  of lasix as well this afternoon. He will call with update tomorrow. Pt in agreement.    Reviewed instructions with pt who states he took Metoprolol about 10 minutes ago.  Reviewed ED precautions and follow up with Afib clinic tomorrow as requested.  Pt is scheduled to see Lawrence Standard, PA-C on 12/12/2020.  Pt verbalizes understanding and agrees with current plan.

## 2020-12-10 NOTE — Telephone Encounter (Signed)
Patient called in stating he woke up in AF this morning HR 61-150 currently 121. BP 163/115. Discussed with Adline Peals PA will resume metoprolol tartrate 25mg  twice a day. He states he feels as though he is holding on to fluid as well he will take an extra 20mg  of lasix as well this afternoon. He will call with update tomorrow. Pt in agreement.

## 2020-12-10 NOTE — Telephone Encounter (Signed)
Repeat transmission received and reviewed. Presenting appears AF w/ RVR 120-130 bpm. Reviewed with Audry Pili, PA at AF Clinic and stacy advised she will call patient back for follow up.       Presenting rhythm appears (1:1) at 230 bpm. Called patient to send another transmission since taking metoprolol tartrate 25 mg. Reports of not feeling well and chest discomfort intermittent this am. Denies chest pain during our conversation. States his rate has decreased since taking the dose of lopressor. Requested another transmission.

## 2020-12-10 NOTE — Telephone Encounter (Signed)
The patient left a message stating he needed [place] to send a reading to the A-fib clinic. I helped him send a manual transmission. I will have the nurse to send the download to the A-fib clinic.

## 2020-12-11 ENCOUNTER — Telehealth: Payer: Self-pay

## 2020-12-11 NOTE — Telephone Encounter (Signed)
I spoke with the patient and he agreed to send the manual transmission right now. I told him if anything is wrong the nurse will give him a call back.

## 2020-12-11 NOTE — Telephone Encounter (Signed)
Talked with patient he is feeling better compared to yesterday. At home his current reading while on the phone was 130/81 HR 63. He will continue metoprolol 25mg  BID and has follow up in place with [facility] tomorrow. Pt in agreement.

## 2020-12-11 NOTE — Telephone Encounter (Signed)
Transmission requested to assess presenting rythem today. Remains in RVR. Routing to AF clinic.

## 2020-12-11 NOTE — Telephone Encounter (Signed)
Transmission received. 12/11/2020

## 2020-12-11 NOTE — Telephone Encounter (Addendum)
[facility] alert for HVR Presenting rhythm likely flutter with 1:1 conduction, HR 230+ Eliquis prescribed, per EPIC instructed to resume Metoprolol 25mg  bid Route to triage, transmission requested by [facility]   Unsuccessful telephone encounter to patient to request manual transmission to assess HR and metoprolol effectiveness secondary to above HVR alert for episode that occurred 12/10/20 and addressed by AF clinic. [employer] compliant VM message left requesting call back to [phone].

## 2020-12-12 ENCOUNTER — Ambulatory Visit (INDEPENDENT_AMBULATORY_CARE_PROVIDER_SITE_OTHER): Payer: Medicare Other | Admitting: Physician Assistant

## 2020-12-12 ENCOUNTER — Encounter: Payer: Self-pay | Admitting: Physician Assistant

## 2020-12-12 ENCOUNTER — Other Ambulatory Visit: Payer: Self-pay

## 2020-12-12 VITALS — BP 130/74 | HR 62 | Ht 71.0 in | Wt 224.0 lb

## 2020-12-12 DIAGNOSIS — Z950 Presence of cardiac pacemaker: Secondary | ICD-10-CM | POA: Diagnosis not present

## 2020-12-12 DIAGNOSIS — I4892 Unspecified atrial flutter: Secondary | ICD-10-CM | POA: Diagnosis not present

## 2020-12-12 DIAGNOSIS — I2510 Atherosclerotic heart disease of native coronary artery without angina pectoris: Secondary | ICD-10-CM | POA: Diagnosis not present

## 2020-12-12 DIAGNOSIS — I10 Essential (primary) hypertension: Secondary | ICD-10-CM

## 2020-12-12 DIAGNOSIS — I4819 Other persistent atrial fibrillation: Secondary | ICD-10-CM | POA: Diagnosis not present

## 2020-12-12 DIAGNOSIS — I471 Supraventricular tachycardia: Secondary | ICD-10-CM | POA: Diagnosis not present

## 2020-12-12 LAB — CUP PACEART INCLINIC DEVICE CHECK
Battery Remaining Longevity: 79 mo
Battery Voltage: 3.08 V
Brady Statistic RA Percent Paced: 74 %
Brady Statistic RV Percent Paced: 0.97 %
Date Time Interrogation Session: 20221118190551
Implantable Lead Implant Date: 20221108
Implantable Lead Implant Date: 20221108
Implantable Lead Location: 753859
Implantable Lead Location: 753860
Implantable Pulse Generator Implant Date: 20221108
Lead Channel Impedance Value: 537.5 Ohm
Lead Channel Impedance Value: 562.5 Ohm
Lead Channel Pacing Threshold Amplitude: 0.5 V
Lead Channel Pacing Threshold Amplitude: 0.5 V
Lead Channel Pacing Threshold Amplitude: 1 V
Lead Channel Pacing Threshold Amplitude: 1 V
Lead Channel Pacing Threshold Pulse Width: 0.5 ms
Lead Channel Pacing Threshold Pulse Width: 0.5 ms
Lead Channel Pacing Threshold Pulse Width: 0.5 ms
Lead Channel Pacing Threshold Pulse Width: 0.5 ms
Lead Channel Sensing Intrinsic Amplitude: 12 mV
Lead Channel Sensing Intrinsic Amplitude: 4.3 mV
Lead Channel Setting Pacing Amplitude: 3.5 V
Lead Channel Setting Pacing Amplitude: 3.5 V
Lead Channel Setting Pacing Pulse Width: 0.5 ms
Lead Channel Setting Sensing Sensitivity: 2 mV
Pulse Gen Model: 2272
Pulse Gen Serial Number: 3968031

## 2020-12-12 NOTE — Patient Instructions (Signed)
Medication Instructions:   Your physician recommends that you continue on your current medications as directed. Please refer to the Current Medication list given to you today.   *If you need a refill on your cardiac medications before your next appointment, please call your pharmacy*   Lab Work: [place]   If you have labs (blood work) drawn today and your tests are completely normal, you will receive your results only by: [place] (if you have MyChart) OR A paper copy in the mail If you have any lab test that is abnormal or we need to change your treatment, we will call you to review the results.   Testing/Procedures: NONE ORDERED  TODAY   Follow-Up: At [facility], you and your health needs are our priority.  As part of our continuing mission to provide you with exceptional heart care, we have created designated Provider Care Teams.  These Care Teams include your primary Cardiologist (physician) and Advanced Practice Providers (APPs -  Physician Assistants and Nurse Practitioners) who all work together to provide you with the care you need, when you need it.  We recommend signing up for the patient portal called "MyChart".  Sign up information is provided on this After Visit Summary.  MyChart is used to connect with patients for Virtual Visits (Telemedicine).  Patients are able to view lab/test results, encounter notes, upcoming appointments, etc.  Non-urgent messages can be sent to your provider as well.   To learn more about what you can do with MyChart, go to [email or web address].    Your next appointment:  AS SCHEDULED   The format for your next appointment:   In Person  Provider:   [name], MD{     : Other Instructions
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
## 2020-12-23 ENCOUNTER — Encounter: Payer: Self-pay | Admitting: Internal Medicine

## 2020-12-23 MED ORDER — METOPROLOL TARTRATE 25 MG PO TABS: 25.0000 mg | ORAL_TABLET | Freq: Two times a day (BID) | ORAL | 3 refills | Status: DC

## 2020-12-24 ENCOUNTER — Other Ambulatory Visit: Payer: Self-pay | Admitting: Endocrinology

## 2020-12-24 DIAGNOSIS — I10 Essential (primary) hypertension: Secondary | ICD-10-CM | POA: Diagnosis not present

## 2020-12-24 DIAGNOSIS — I482 Chronic atrial fibrillation, unspecified: Secondary | ICD-10-CM | POA: Diagnosis not present

## 2020-12-24 DIAGNOSIS — E1165 Type 2 diabetes mellitus with hyperglycemia: Secondary | ICD-10-CM | POA: Diagnosis not present

## 2020-12-24 DIAGNOSIS — Z794 Long term (current) use of insulin: Secondary | ICD-10-CM | POA: Diagnosis not present

## 2020-12-25 DIAGNOSIS — Z20828 Contact with and (suspected) exposure to other viral communicable diseases: Secondary | ICD-10-CM | POA: Diagnosis not present

## 2020-12-26 ENCOUNTER — Other Ambulatory Visit: Payer: Self-pay | Admitting: Internal Medicine

## 2020-12-30 ENCOUNTER — Ambulatory Visit: Payer: Medicare Other | Admitting: Gastroenterology

## 2021-01-09 ENCOUNTER — Encounter: Payer: Self-pay | Admitting: Internal Medicine

## 2021-01-14 DIAGNOSIS — R6889 Other general symptoms and signs: Secondary | ICD-10-CM | POA: Diagnosis not present

## 2021-01-14 DIAGNOSIS — Z20828 Contact with and (suspected) exposure to other viral communicable diseases: Secondary | ICD-10-CM | POA: Diagnosis not present

## 2021-01-14 DIAGNOSIS — R0981 Nasal congestion: Secondary | ICD-10-CM | POA: Diagnosis not present

## 2021-01-17 ENCOUNTER — Encounter: Payer: Self-pay | Admitting: Endocrinology

## 2021-01-17 DIAGNOSIS — Z794 Long term (current) use of insulin: Secondary | ICD-10-CM

## 2021-01-19 ENCOUNTER — Encounter: Payer: Self-pay | Admitting: Cardiology

## 2021-01-21 MED ORDER — BD PEN NEEDLE NANO 2ND GEN 32G X 4 MM MISC: 4 refills | Status: DC

## 2021-01-21 MED ORDER — ONETOUCH VERIO VI STRP: ORAL_STRIP | 3 refills | Status: DC

## 2021-01-23 DIAGNOSIS — E1165 Type 2 diabetes mellitus with hyperglycemia: Secondary | ICD-10-CM | POA: Diagnosis not present

## 2021-01-23 DIAGNOSIS — I482 Chronic atrial fibrillation, unspecified: Secondary | ICD-10-CM | POA: Diagnosis not present

## 2021-01-23 DIAGNOSIS — Z794 Long term (current) use of insulin: Secondary | ICD-10-CM | POA: Diagnosis not present

## 2021-01-23 DIAGNOSIS — I10 Essential (primary) hypertension: Secondary | ICD-10-CM | POA: Diagnosis not present

## 2021-01-27 ENCOUNTER — Encounter: Payer: Self-pay | Admitting: Cardiology

## 2021-01-27 NOTE — Telephone Encounter (Signed)
Error

## 2021-01-27 NOTE — Telephone Encounter (Signed)
What problem are you experiencing? Machine has been saying its exceeded it's life expectancy for weeks    Who is your medical equipment company? [employer]  Calling due to no response to MyChart message  Please route to the sleep study assistant.

## 2021-01-30 ENCOUNTER — Encounter (HOSPITAL_BASED_OUTPATIENT_CLINIC_OR_DEPARTMENT_OTHER): Payer: Self-pay | Admitting: Internal Medicine

## 2021-01-30 NOTE — Telephone Encounter (Signed)
Please advise 

## 2021-02-03 ENCOUNTER — Telehealth: Payer: Self-pay | Admitting: *Deleted

## 2021-02-03 DIAGNOSIS — G4733 Obstructive sleep apnea (adult) (pediatric): Secondary | ICD-10-CM

## 2021-02-03 NOTE — Telephone Encounter (Signed)
Pt confirms he was followed up with on this matter and instructed to further discuss w/ PCP

## 2021-02-03 NOTE — Telephone Encounter (Signed)
-----   Message from [name], MD sent at 02/01/2021 11:09 AM EST ----- Regarding: RE: New cpap Rx Please order new [employer] CPAP at 15cm H2O with mask of choice and heated humidity ----- Message ----- From: Freada Bergeron, CMA Sent: 01/30/2021  12:39 PM EST To: [name], MD Subject: New cpap Rx                                    I got a message on my machine stating I have exceeded the number of hours expected for my machine.  What do you suggest?  Set pressure 15 cmH2O EPR Fulltime EPR level 3 T

## 2021-02-03 NOTE — Telephone Encounter (Signed)
Order placed to [facility] for a new [employer] CPAP at 15cm H2O with mask of choice and heated humidity via community message.

## 2021-02-22 DIAGNOSIS — E1165 Type 2 diabetes mellitus with hyperglycemia: Secondary | ICD-10-CM | POA: Diagnosis not present

## 2021-02-22 DIAGNOSIS — I10 Essential (primary) hypertension: Secondary | ICD-10-CM | POA: Diagnosis not present

## 2021-03-03 ENCOUNTER — Ambulatory Visit (INDEPENDENT_AMBULATORY_CARE_PROVIDER_SITE_OTHER): Payer: Medicare Other

## 2021-03-03 DIAGNOSIS — I495 Sick sinus syndrome: Secondary | ICD-10-CM

## 2021-03-03 LAB — CUP PACEART REMOTE DEVICE CHECK
Battery Remaining Longevity: 70 mo
Battery Remaining Percentage: 95.5 %
Battery Voltage: 3.01 V
Brady Statistic AP VP Percent: 1 %
Brady Statistic AP VS Percent: 78 %
Brady Statistic AS VP Percent: 1.4 %
Brady Statistic AS VS Percent: 18 %
Brady Statistic RA Percent Paced: 76 %
Brady Statistic RV Percent Paced: 2.3 %
Date Time Interrogation Session: 20230207020013
Implantable Lead Implant Date: 20221108
Implantable Lead Implant Date: 20221108
Implantable Lead Location: 753859
Implantable Lead Location: 753860
Implantable Pulse Generator Implant Date: 20221108
Lead Channel Impedance Value: 390 Ohm
Lead Channel Impedance Value: 390 Ohm
Lead Channel Pacing Threshold Amplitude: 0.5 V
Lead Channel Pacing Threshold Amplitude: 1 V
Lead Channel Pacing Threshold Pulse Width: 0.5 ms
Lead Channel Pacing Threshold Pulse Width: 0.5 ms
Lead Channel Sensing Intrinsic Amplitude: 4 mV
Lead Channel Sensing Intrinsic Amplitude: 8.5 mV
Lead Channel Setting Pacing Amplitude: 3.5 V
Lead Channel Setting Pacing Amplitude: 3.5 V
Lead Channel Setting Pacing Pulse Width: 0.5 ms
Lead Channel Setting Sensing Sensitivity: 2 mV
Pulse Gen Model: 2272
Pulse Gen Serial Number: 3968031

## 2021-03-04 ENCOUNTER — Ambulatory Visit (INDEPENDENT_AMBULATORY_CARE_PROVIDER_SITE_OTHER): Payer: Medicare Other | Admitting: Internal Medicine

## 2021-03-04 ENCOUNTER — Encounter: Payer: Self-pay | Admitting: Internal Medicine

## 2021-03-04 ENCOUNTER — Other Ambulatory Visit: Payer: Self-pay

## 2021-03-04 VITALS — BP 118/68 | HR 63 | Temp 97.7°F | Ht 71.0 in | Wt 272.4 lb

## 2021-03-04 DIAGNOSIS — K922 Gastrointestinal hemorrhage, unspecified: Secondary | ICD-10-CM

## 2021-03-04 DIAGNOSIS — R531 Weakness: Secondary | ICD-10-CM | POA: Diagnosis not present

## 2021-03-04 DIAGNOSIS — K2270 Barrett's esophagus without dysplasia: Secondary | ICD-10-CM | POA: Diagnosis not present

## 2021-03-04 DIAGNOSIS — I4891 Unspecified atrial fibrillation: Secondary | ICD-10-CM | POA: Diagnosis not present

## 2021-03-04 DIAGNOSIS — R0602 Shortness of breath: Secondary | ICD-10-CM | POA: Diagnosis not present

## 2021-03-04 DIAGNOSIS — D519 Vitamin B12 deficiency anemia, unspecified: Secondary | ICD-10-CM | POA: Diagnosis not present

## 2021-03-04 DIAGNOSIS — D649 Anemia, unspecified: Secondary | ICD-10-CM | POA: Diagnosis not present

## 2021-03-04 DIAGNOSIS — K625 Hemorrhage of anus and rectum: Secondary | ICD-10-CM | POA: Diagnosis not present
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
## 2021-03-04 NOTE — Patient Instructions (Signed)
We will schedule you for your EGD and colonoscopy today to further evaluate your rectal bleeding as well as for surveillance purposes in regards to your Barrett's esophagus.  You will need to hold your Eliquis x48 hours prior to procedures.  We will reach out to your cardiologist in regards to this for their blessing.  Please let us know what your blood work today shows once you receive it.  It was nice seeing you again today.  Dr. Abbey Chatters  At [facility] we value your feedback. You may receive a survey about your visit today. Please share your experience as we strive to create trusting relationships with our patients to provide genuine, compassionate, quality care.  We appreciate your understanding and patience as we review any laboratory studies, imaging, and other diagnostic tests that are ordered as we care for you. Our office policy is 5 business days for review of these results, and any emergent or urgent results are addressed in a timely manner for your best interest. If you do not hear from our office in 1 week, please contact us.   We also encourage the use of MyChart, which contains your medical information for your review as well. If you are not enrolled in this feature, an access code is on this after visit summary for your convenience. Thank you for allowing [place] to be involved in your care.  It was great to see you today!  I hope you have a great rest of your Winter!    [place]on Alas. Abbey Chatters, D.O. Gastroenterology and Hepatology [facility]
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
## 2021-03-05 ENCOUNTER — Telehealth: Payer: Self-pay

## 2021-03-05 NOTE — Telephone Encounter (Signed)
Pt has appt with Dr. [name] on 03/19/21 for pre op clearance. . I will forward notes as FYI to requesting  office the pt has appt 03/19/21. Once Dr. [name] has given clearance , he will fax over his ov note with clearance and any recommendations.

## 2021-03-05 NOTE — Telephone Encounter (Signed)
Attention: Preop   We would like to request CARDIAC CLEARANCE for patient please.  Procedure: EGD AND COLONOSCOPY  Date: TBD  Medication to hold:   Surgeon: DR. Abbey Chatters  Phone: [phone]  Fax:  [phone]  Type of Anesthesia: PROPOFOL

## 2021-03-05 NOTE — Telephone Encounter (Signed)
Patient with diagnosis of afib on Eliquis for anticoagulation.    Procedure: EGD and colonoscopy Date of procedure: TBD  CHA2DS2-VASc Score = 5  This indicates a 7.2% annual risk of stroke. The patient's score is based upon: CHF History: 1 HTN History: 1 Diabetes History: 1 Stroke History: 0 Vascular Disease History: 1 Age Score: 1 Gender Score: 0   CrCl >156mL/min Platelet count 202K  Per office protocol, patient can hold Eliquis for 1-2 days prior to procedure.

## 2021-03-05 NOTE — Telephone Encounter (Signed)
Patient was last seen by Lawrence Jordan but patient was still having some fatigue and weakness. Patient is currently scheduled with Dr. [name] on 2/23. Will differ final cardiac clearance to MD.

## 2021-03-05 NOTE — Telephone Encounter (Signed)
Clinical pharmacist to review Eliquis 

## 2021-03-06 NOTE — Telephone Encounter (Signed)
Addended note sent to [facility]

## 2021-03-06 NOTE — Progress Notes (Signed)
Remote pacemaker transmission.   

## 2021-03-08 ENCOUNTER — Other Ambulatory Visit: Payer: Self-pay | Admitting: Internal Medicine

## 2021-03-08 DIAGNOSIS — I4819 Other persistent atrial fibrillation: Secondary | ICD-10-CM

## 2021-03-09 ENCOUNTER — Encounter: Payer: Medicare Other | Admitting: Internal Medicine

## 2021-03-09 NOTE — Telephone Encounter (Signed)
Prescription refill request for Eliquis received. Indication: Afib  Last office visit: 12/12/20 Lawrence Jordan)  Scr: 0.73 (11/10/20)  Age: 71 Weight: 123.6kg  Appropriate dose and refill sent to requested pharmacy.

## 2021-03-16 ENCOUNTER — Other Ambulatory Visit: Payer: Self-pay | Admitting: Endocrinology

## 2021-03-18 ENCOUNTER — Other Ambulatory Visit: Payer: Self-pay | Admitting: Endocrinology

## 2021-03-19 ENCOUNTER — Ambulatory Visit (INDEPENDENT_AMBULATORY_CARE_PROVIDER_SITE_OTHER): Payer: Medicare Other | Admitting: Internal Medicine

## 2021-03-19 ENCOUNTER — Encounter (HOSPITAL_BASED_OUTPATIENT_CLINIC_OR_DEPARTMENT_OTHER): Payer: Self-pay | Admitting: Internal Medicine

## 2021-03-19 ENCOUNTER — Other Ambulatory Visit: Payer: Self-pay

## 2021-03-19 ENCOUNTER — Telehealth: Payer: Self-pay | Admitting: Internal Medicine

## 2021-03-19 ENCOUNTER — Encounter (HOSPITAL_BASED_OUTPATIENT_CLINIC_OR_DEPARTMENT_OTHER): Payer: Self-pay

## 2021-03-19 VITALS — BP 112/60 | HR 55 | Ht 71.0 in | Wt 276.0 lb

## 2021-03-19 DIAGNOSIS — I495 Sick sinus syndrome: Secondary | ICD-10-CM | POA: Diagnosis not present

## 2021-03-19 DIAGNOSIS — E611 Iron deficiency: Secondary | ICD-10-CM | POA: Diagnosis not present

## 2021-03-19 DIAGNOSIS — I4819 Other persistent atrial fibrillation: Secondary | ICD-10-CM | POA: Diagnosis not present

## 2021-03-19 DIAGNOSIS — Z950 Presence of cardiac pacemaker: Secondary | ICD-10-CM

## 2021-03-19 DIAGNOSIS — K922 Gastrointestinal hemorrhage, unspecified: Secondary | ICD-10-CM | POA: Diagnosis not present

## 2021-03-19 DIAGNOSIS — I10 Essential (primary) hypertension: Secondary | ICD-10-CM | POA: Diagnosis not present

## 2021-03-19 LAB — CUP PACEART INCLINIC DEVICE CHECK
Battery Remaining Longevity: 123 mo
Battery Voltage: 3.01 V
Brady Statistic RA Percent Paced: 73 %
Brady Statistic RV Percent Paced: 2.1 %
Date Time Interrogation Session: 20230223135439
Implantable Lead Implant Date: 20221108
Implantable Lead Implant Date: 20221108
Implantable Lead Location: 753859
Implantable Lead Location: 753860
Implantable Pulse Generator Implant Date: 20221108
Lead Channel Impedance Value: 425 Ohm
Lead Channel Impedance Value: 425 Ohm
Lead Channel Pacing Threshold Amplitude: 0.5 V
Lead Channel Pacing Threshold Amplitude: 1 V
Lead Channel Pacing Threshold Pulse Width: 0.5 ms
Lead Channel Pacing Threshold Pulse Width: 0.5 ms
Lead Channel Sensing Intrinsic Amplitude: 11.8 mV
Lead Channel Sensing Intrinsic Amplitude: 4.3 mV
Lead Channel Setting Pacing Amplitude: 2 V
Lead Channel Setting Pacing Amplitude: 2.5 V
Lead Channel Setting Pacing Pulse Width: 0.5 ms
Lead Channel Setting Sensing Sensitivity: 2 mV
Pulse Gen Model: 2272
Pulse Gen Serial Number: 3968031
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
## 2021-03-19 NOTE — Telephone Encounter (Signed)
Called pt. He is scheduled for 2/27 at 1:15pm. Aware will call back with pre-op appt. Last took iron yesterday. Spoke with Dr. Abbey Chatters and advised okay to add for Monday but no more iron until after procedure.   Called pt back and is aware. Reports he already has prep at home. Will send new instructions via mychart.

## 2021-03-19 NOTE — Patient Instructions (Addendum)
Medication Instructions:  Your physician has recommended you make the following change in your medication:    STOP Eliquis   STOP celebrex  Labwork: You will get lab work today:  CBC and BMP  Testing/Procedures: None ordered.  Follow-Up: Your physician wants you to follow-up: May 01, 2021 at 1:45 pm at the [facility] office  Remote monitoring is used to monitor your Pacemaker from home. This monitoring reduces the number of office visits required to check your device to one time per year. It allows [place] to keep an eye on the functioning of your device to ensure it is working properly. You are scheduled for a device check from home on 06/02/2021. You may send your transmission at any time that day. If you have a wireless device, the transmission will be sent automatically. After your physician reviews your transmission, you will receive a postcard with your next transmission date.  Any Other Special Instructions Will Be Listed Below (If Applicable).  If you need a refill on your cardiac medications before your next appointment, please call your pharmacy.

## 2021-03-19 NOTE — Telephone Encounter (Signed)
I was contacted by patient's cardiologist Dr. [name] who he saw in clinic today. Patient reports symptoms of generalized weakness/fatigue. Had Hgb checked on 2/8 by PCP with Hgb in the 6-7 range, starter on PO iron. Dr. [name] rechecking today. Cleared for endoscopy from cardiac standpoint. Eliquis and Celebrex being held.   Can we schedule patient for EGD and Colonoscopy ASAP, ASA III, dx iron deficiency anemia.

## 2021-03-19 NOTE — Telephone Encounter (Signed)
Called pt and he is aware of pre-op appt details.

## 2021-03-20 ENCOUNTER — Encounter (HOSPITAL_COMMUNITY)
Admission: RE | Admit: 2021-03-20 | Discharge: 2021-03-20 | Disposition: A | Discharge status: Home / Self Care | Payer: Medicare Other | Source: Ambulatory Visit | Attending: Internal Medicine | Admitting: Internal Medicine

## 2021-03-20 ENCOUNTER — Emergency Department (HOSPITAL_COMMUNITY)
Admission: EM | Admit: 2021-03-20 | Discharge: 2021-03-21 | Disposition: A | Discharge status: Home / Self Care | Payer: Medicare Other | Attending: Emergency Medicine | Admitting: Emergency Medicine

## 2021-03-20 ENCOUNTER — Encounter (HOSPITAL_COMMUNITY): Payer: Self-pay

## 2021-03-20 ENCOUNTER — Other Ambulatory Visit: Payer: Self-pay

## 2021-03-20 DIAGNOSIS — Z794 Long term (current) use of insulin: Secondary | ICD-10-CM | POA: Insufficient documentation

## 2021-03-20 DIAGNOSIS — D649 Anemia, unspecified: Secondary | ICD-10-CM | POA: Insufficient documentation

## 2021-03-20 DIAGNOSIS — R0602 Shortness of breath: Secondary | ICD-10-CM | POA: Diagnosis present

## 2021-03-20 LAB — CBC WITH DIFFERENTIAL/PLATELET
Basophils Absolute: 0.1 10*3/uL (ref 0.0–0.2)
Basos: 1 %
EOS (ABSOLUTE): 0.2 10*3/uL (ref 0.0–0.4)
Eos: 3 %
Hematocrit: 25 % — ABNORMAL LOW (ref 37.5–51.0)
Hemoglobin: 6.9 g/dL — CL (ref 13.0–17.7)
Immature Grans (Abs): 0 10*3/uL (ref 0.0–0.1)
Immature Granulocytes: 0 %
Lymphocytes Absolute: 1.8 10*3/uL (ref 0.7–3.1)
Lymphs: 33 %
MCH: 19.8 pg — ABNORMAL LOW (ref 26.6–33.0)
MCHC: 27.6 g/dL — ABNORMAL LOW (ref 31.5–35.7)
MCV: 72 fL — ABNORMAL LOW (ref 79–97)
Monocytes Absolute: 0.6 10*3/uL (ref 0.1–0.9)
Monocytes: 11 %
Neutrophils Absolute: 2.9 10*3/uL (ref 1.4–7.0)
Neutrophils: 52 %
Platelets: 307 10*3/uL (ref 150–450)
RBC: 3.49 x10E6/uL — ABNORMAL LOW (ref 4.14–5.80)
RDW: 18.4 % — ABNORMAL HIGH (ref 11.6–15.4)
WBC: 5.6 10*3/uL (ref 3.4–10.8)

## 2021-03-20 LAB — CBC
HCT: 24.8 % — ABNORMAL LOW (ref 39.0–52.0)
Hemoglobin: 6.9 g/dL — CL (ref 13.0–17.0)
MCH: 21 pg — ABNORMAL LOW (ref 26.0–34.0)
MCHC: 27.8 g/dL — ABNORMAL LOW (ref 30.0–36.0)
MCV: 75.6 fL — ABNORMAL LOW (ref 80.0–100.0)
Platelets: 280 10*3/uL (ref 150–400)
RBC: 3.28 MIL/uL — ABNORMAL LOW (ref 4.22–5.81)
RDW: 20.9 % — ABNORMAL HIGH (ref 11.5–15.5)
WBC: 5.3 10*3/uL (ref 4.0–10.5)
nRBC: 0 % (ref 0.0–0.2)

## 2021-03-20 LAB — BASIC METABOLIC PANEL
Anion gap: 7 (ref 5–15)
BUN/Creatinine Ratio: 17 (ref 10–24)
BUN: 15 mg/dL (ref 8–27)
BUN: 18 mg/dL (ref 8–23)
CO2: 23 mmol/L (ref 20–29)
CO2: 25 mmol/L (ref 22–32)
Calcium: 9.9 mg/dL (ref 8.6–10.2)
Calcium: 9 mg/dL (ref 8.9–10.3)
Chloride: 101 mmol/L (ref 96–106)
Chloride: 103 mmol/L (ref 98–111)
Creatinine, Ser: 0.89 mg/dL (ref 0.76–1.27)
Creatinine, Ser: 0.8 mg/dL (ref 0.61–1.24)
GFR, Estimated: >60 mL/min (ref >60)
Glucose, Bld: 90 mg/dL (ref 70–99)
Glucose: 73 mg/dL (ref 70–99)
Potassium: 4.7 mmol/L (ref 3.5–5.2)
Potassium: 3.9 mmol/L (ref 3.5–5.1)
Sodium: 138 mmol/L (ref 134–144)
Sodium: 135 mmol/L (ref 135–145)
eGFR: 92 mL/min/{1.73_m2} (ref >59)

## 2021-03-20 LAB — PREPARE RBC (CROSSMATCH)

## 2021-03-20 MED ORDER — SODIUM CHLORIDE 0.9 % IV SOLN
10.0000 mL/h | Freq: Once | INTRAVENOUS | Status: AC
  Administered 2021-03-20: 10 mL/h via INTRAVENOUS

## 2021-03-20 NOTE — ED Triage Notes (Signed)
Pt to er states that he is here because he needs a blood transfusion, states that his hemoglobin is 6 and he is scheduled for surgery Monday and needs blood before the procedure.

## 2021-03-20 NOTE — ED Provider Notes (Signed)
[facility] EMERGENCY DEPARTMENT Provider Note   CSN: [id number] Arrival date & time: 03/20/21  1618     History  Chief Complaint  Patient presents with   Anemia    Lawrence Jordan is a 71 y.o. male referred into the emergency department by his GI Doctor (Dr Abbey Chatters), who also spoke to me by phone, with request for blood transfusion.  According to the gastroenterologist, the patient is needing 2 units of blood transfusion for a procedure planned for Monday, blood transfusion requested by anesthesia and the emergency department and we will wait to arrange for stat transfusion at this time.  The patient denies any new acute symptoms, including worsening shortness of breath or lightheadedness.  He has never had a blood transfusion before that he is aware of.  HPI     Home Medications Prior to Admission medications   Medication Sig Start Date End Date Taking? Authorizing Provider  acetaminophen (TYLENOL) 500 MG tablet Take 1,000 mg by mouth every 8 (eight) hours as needed for moderate pain.    [provider]  baclofen (LIORESAL) 10 MG tablet Take 10 mg by mouth 3 (three) times daily.    [provider]  Brimonidine Tartrate 0.025 % SOLN Place 1 drop into both eyes 2 (two) times daily.  12/25/16   [provider]  calcium carbonate (TUMS EX) 750 MG chewable tablet Chew 1,500 mg by mouth at bedtime.    [provider]  Calcium Carbonate-Vitamin D (CALCIUM-D PO) Take 1 tablet by mouth daily.    [provider]  ferrous sulfate 325 (65 FE) MG tablet Take 325 mg by mouth daily. 03/09/21   [provider]  fluticasone (FLONASE) 50 MCG/ACT nasal spray Place 2 sprays into both nostrils daily. 08/28/20   [provider]  furosemide (LASIX) 20 MG tablet Take 1 tablet (20 mg total) by mouth daily. May take an extra tablet daily as needed for weight gain 07/29/20   Fenton, Clint R, PA  glucose blood (ONETOUCH VERIO) test strip USE ONETOUCH  VERIO TEST STRIPS AS INSTRUCTED TO CHECK BLOOD SUGAR 2 TIMES DAILY.DX:E11.65 01/21/21   Elayne Snare, MD  hydrocortisone 2.5 % cream APPLY TOPICALLY 2 (TWO) TIMES DAILY AS NEEDED (RASH). 10/02/20   Lavonna Monarch, MD  Insulin Pen Needle (BD PEN NEEDLE NANO 2ND GEN) 32G X 4 MM MISC USE PEN NEEDLE AS INSTRUCTED TO INJECT INSULIN 4 TIMES DAILY. 01/21/21   Elayne Snare, MD  JARDIANCE 25 MG TABS tablet TAKE 1 TABLET BY MOUTH EVERY DAY 12/26/20   Elayne Snare, MD  KLOR-CON M20 20 MEQ tablet TAKE 1 TABLET BY MOUTH TWICE A DAY Patient taking differently: Take 20 mEq by mouth daily. 09/04/20   Allred, Jeneen Rinks, MD  latanoprost (XALATAN) 0.005 % ophthalmic solution Place 1 drop into both eyes at bedtime.  11/14/17   [provider]  levocetirizine (XYZAL) 5 MG tablet Take 5 mg by mouth every evening.    [provider]  losartan (COZAAR) 25 MG tablet Take 25 mg by mouth daily. 07/02/20   [provider]  Magnesium 200 MG TABS Take 1 tablet (200 mg total) by mouth daily. 07/27/16   Sherran Needs, NP  metFORMIN (GLUCOPHAGE) 1000 MG tablet 1 TABLET TWICE DAILY WITH FOOD, PLEASE MAKE FOLLOW-UP APPOINTMENT 03/20/21   Elayne Snare, MD  metoprolol tartrate (LOPRESSOR) 25 MG tablet Take 1 tablet (25 mg total) by mouth 2 (two) times daily. 12/23/20 03/23/21  [name], MD  Multiple Vitamin (  MULTIVITAMIN) tablet Take 1 tablet by mouth daily.      [provider]  [facility] DELICA LANCETS FINE MISC USE TO CHECK BLOOD SUGAR 2 TIMES PER DAY dx code E11.9 05/10/17   Elayne Snare, MD  pantoprazole (PROTONIX) 40 MG tablet TAKE 1 TABLET BY MOUTH EVERY DAY 09/01/20   Allred, Jeneen Rinks, MD  Semaglutide, 2 MG/DOSE, (OZEMPIC, 2 MG/DOSE,) 8 MG/3ML SOPN INJECT TWO MG ONCE WEEKLY, need follow up 03/17/21   Elayne Snare, MD  simvastatin (ZOCOR) 20 MG tablet TAKE 1 TABLET BY MOUTH DAILY AT 6 PM 08/20/20   Allred, Jeneen Rinks, MD  tamsulosin (FLOMAX) 0.4 MG CAPS capsule Take 0.4 mg by mouth at bedtime.  03/29/19   [provider]  traMADol-acetaminophen (ULTRACET) 37.5-325 MG tablet Take 1 tablet by mouth every 4 (four) hours as needed for moderate pain. 09/01/20   Carole Civil, MD  TRESIBA FLEXTOUCH 200 UNIT/ML FlexTouch Pen INJECT 60 UNITS INTO THE SKIN DAILY WITH SUPPER. IN Patient taking differently: Inject 24 Units into the skin daily. At dinner time 10/06/20   Elayne Snare, MD      Allergies    Iodinated contrast media, Sulfa antibiotics, Adhesive [tape], and Amoxicillin    Review of Systems   Review of Systems  Physical Exam Updated Vital Signs BP 137/63 (BP Location: Left Arm)    Pulse 60    Temp 97.9 F (36.6 C) (Oral)    Resp 18    Ht 5\' 11"  (1.803 m)    Wt 125.2 kg    SpO2 97%    BMI 38.49 kg/m  Physical Exam Constitutional:      General: He is not in acute distress. HENT:     Head: Normocephalic and atraumatic.  Eyes:     Pupils: Pupils are equal, round, and reactive to light.  Cardiovascular:     Rate and Rhythm: Normal rate and regular rhythm.  Pulmonary:     Effort: Pulmonary effort is normal. No respiratory distress.  Skin:    General: Skin is warm and dry.  Neurological:     General: No focal deficit present.     Mental Status: He is alert and oriented to person, place, and time. Mental status is at baseline.  Psychiatric:        Mood and Affect: Mood normal.        Behavior: Behavior normal.    ED Results / Procedures / Treatments   Labs (all labs ordered are listed, but only abnormal results are displayed) Labs Reviewed  CBC - Abnormal; Notable for the following components:      Result Value   RBC 3.28 (*)    Hemoglobin 6.9 (*)    HCT 24.8 (*)    MCV 75.6 (*)    MCH 21.0 (*)    MCHC 27.8 (*)    RDW 20.9 (*)    All other components within normal limits  BASIC METABOLIC PANEL  TYPE AND SCREEN  PREPARE RBC (CROSSMATCH)    EKG None  Radiology CUP PACEART [facility] DEVICE CHECK  Result Date: 03/19/2021 Pacemaker check in clinic. Normal device  function. Thresholds, sensing, impedances consistent with previous measurements. Device programmed to maximize longevity. 11 mode switches- Fastest/ Longest 2hours 21 minutes, peak A/V rates 614/163.  Most recent episode 02/18/21, <3 minutes peak A/V rates 183/144.  No high ventricular rates noted. Device programmed at appropriate safety margins-  Outputs adjusted for now chronic leads: RA set at 2.0V @ 0.73ms and 2.5V @ 0.72ms.  Attempted to program on auto capture  and Cap confirm, patient very sensitive to testing in these modes.  Histogram distribution appropriate for patient activity level. Device programmed to optimize intrinsic conduction. Estimated longevity 8.8 years. Patient enrolled in remote follow-up, next scheduled check 06/02/21. Patient education completed.Trena Platt, BSN, RN   Procedures .Critical Care Performed by: Wyvonnia Dusky, MD Authorized by: Wyvonnia Dusky, MD   Critical care provider statement:    Critical care time (minutes):  45   Critical care time was exclusive of:  Separately billable procedures and treating other patients   Critical care was necessary to treat or prevent imminent or life-threatening deterioration of the following conditions:  Circulatory failure   Critical care was time spent personally by me on the following activities:  Ordering and performing treatments and interventions, ordering and review of laboratory studies, ordering and review of radiographic studies, pulse oximetry, review of old charts, examination of patient, evaluation of patient's response to treatment and gastric intubation Comments:     Anemia, transfusions    Medications Ordered in ED Medications  0.9 %  sodium chloride infusion (0 mL/hr Intravenous Stopped 03/21/21 0050)    ED Course/ Medical Decision Making/ A&P Clinical Course as of 03/21/21 1017  Fri Mar 20, 2021  2205 2nd unit starting now [MT]    Clinical Course User Index [MT] Langston Masker Carola Rhine, MD                            Medical Decision Making Amount and/or Complexity of Data Reviewed Labs: ordered.  Risk Prescription drug management.   Patient is here with request for blood transfusion for planned GI procedure on Monday, at the request of the anesthesiologist and a gastroenterologist.  He has no acute symptoms at this time.  Vital signs within normal limits.  His hemoglobin as an outpatient was between 6 and 7 recently.  We will recheck today, the patient was consented verbally for blood transfusion, the risk and benefits discussed with him.  He is agreeable to transfusion with 2 units of blood.  I have ordered the transfusion.  Per my discussion with the gastroenterologist, the patient could be discharged home after his blood transfusions-there is no other emergent indication for hospitalization from their perspective.  I agree this is a reasonable plan  Reassessed and stable during transfusions.  Hgb low but near recent baseline level.        Final Clinical Impression(s) / ED Diagnoses Final diagnoses:  Anemia, unspecified type    Rx / DC Orders ED Discharge Orders     None         Wyvonnia Dusky, MD 03/21/21 1017

## 2021-03-20 NOTE — Patient Instructions (Addendum)
Lawrence Jordan  03/20/2021     @PREFPERIOPPHARMACY @   Your procedure is scheduled on  03/23/2021.   Report to [facility] at  1115  A.M.   Call this number if you have problems the morning of surgery:  [phone]   Remember:  Follow the diet instructions given to you by the office.       DO NOT take any medications for diabetes the morning of your procedure.     Take these medicines the morning of surgery with A SIP OF WATER                Baclofen, metoprolol, protonix, ultracet(if needed).     Do not wear jewelry, make-up or nail polish.  Do not wear lotions, powders, or perfumes, or deodorant.  Do not shave 48 hours prior to surgery.  Men may shave face and neck.  Do not bring valuables to the hospital.  [facility] is not responsible for any belongings or valuables.  Contacts, dentures or bridgework may not be worn into surgery.  Leave your suitcase in the car.  After surgery it may be brought to your room.  For patients admitted to the hospital, discharge time will be determined by your treatment team.  Patients discharged the day of surgery will not be allowed to drive home and must have someone with them for 24 hours.    Special instructions:   DO NOT smoke tobacco or vape for 24 hours before your procedure.  Please read over the following fact sheets that you were given. Anesthesia Post-op Instructions and Care and Recovery After Surgery      Upper Endoscopy, Adult, Care After This sheet gives you information about how to care for yourself after your procedure. Your health care provider may also give you more specific instructions. If you have problems or questions, contact your health care provider. What can I expect after the procedure? After the procedure, it is common to have: A sore throat. Mild stomach pain or discomfort. Bloating. Nausea. Follow these instructions at home:  Follow instructions from your health care provider about  what to eat or drink after your procedure. Return to your normal activities as told by your health care provider. Ask your health care provider what activities are safe for you. Take over-the-counter and prescription medicines only as told by your health care provider. If you were given a sedative during the procedure, it can affect you for several hours. Do not drive or operate machinery until your health care provider says that it is safe. Keep all follow-up visits as told by your health care provider. This is important. Contact a health care provider if you have: A sore throat that lasts longer than one day. Trouble swallowing. Get help right away if: You vomit blood or your vomit looks like coffee grounds. You have: A fever. Bloody, black, or tarry stools. A severe sore throat or you cannot swallow. Difficulty breathing. Severe pain in your chest or abdomen. Summary After the procedure, it is common to have a sore throat, mild stomach discomfort, bloating, and nausea. If you were given a sedative during the procedure, it can affect you for several hours. Do not drive or operate machinery until your health care provider says that it is safe. Follow instructions from your health care provider about what to eat or drink after your procedure. Return to your normal activities as told by your health care provider. This  information is not intended to replace advice given to you by your health care provider. Make sure you discuss any questions you have with your health care provider. Document Revised: 11/17/2018 Document Reviewed: 06/13/2017 Elsevier Patient Education  2022 [place]. Colonoscopy, Adult, Care After This sheet gives you information about how to care for yourself after your procedure. Your health care provider may also give you more specific instructions. If you have problems or questions, contact your health care provider. What can I expect after the procedure? After the  procedure, it is common to have: A small amount of blood in your stool for 24 hours after the procedure. Some gas. Mild cramping or bloating of your abdomen. Follow these instructions at home: Eating and drinking  Drink enough fluid to keep your urine pale yellow. Follow instructions from your health care provider about eating or drinking restrictions. Resume your normal diet as instructed by your health care provider. Avoid heavy or fried foods that are hard to digest. Activity Rest as told by your health care provider. Avoid sitting for a long time without moving. Get up to take short walks every 1-2 hours. This is important to improve blood flow and breathing. Ask for help if you feel weak or unsteady. Return to your normal activities as told by your health care provider. Ask your health care provider what activities are safe for you. Managing cramping and bloating  Try walking around when you have cramps or feel bloated. Apply heat to your abdomen as told by your health care provider. Use the heat source that your health care provider recommends, such as a moist heat pack or a heating pad. Place a towel between your skin and the heat source. Leave the heat on for 20-30 minutes. Remove the heat if your skin turns bright red. This is especially important if you are unable to feel pain, heat, or cold. You may have a greater risk of getting burned. General instructions If you were given a sedative during the procedure, it can affect you for several hours. Do not drive or operate machinery until your health care provider says that it is safe. For the first 24 hours after the procedure: Do not sign important documents. Do not drink alcohol. Do your regular daily activities at a slower pace than normal. Eat soft foods that are easy to digest. Take over-the-counter and prescription medicines only as told by your health care provider. Keep all follow-up visits as told by your health care  provider. This is important. Contact a health care provider if: You have blood in your stool 2-3 days after the procedure. Get help right away if you have: More than a small spotting of blood in your stool. Large blood clots in your stool. Swelling of your abdomen. Nausea or vomiting. A fever. Increasing pain in your abdomen that is not relieved with medicine. Summary After the procedure, it is common to have a small amount of blood in your stool. You may also have mild cramping and bloating of your abdomen. If you were given a sedative during the procedure, it can affect you for several hours. Do not drive or operate machinery until your health care provider says that it is safe. Get help right away if you have a lot of blood in your stool, nausea or vomiting, a fever, or increased pain in your abdomen. This information is not intended to replace advice given to you by your health care provider. Make sure you discuss any questions you  have with your health care provider. Document Revised: 11/17/2018 Document Reviewed: 08/07/2018 Elsevier Patient Education  [place] After This sheet gives you information about how to care for yourself after your procedure. Your health care provider may also give you more specific instructions. If you have problems or questions, contact your health care provider. What can I expect after the procedure? After the procedure, it is common to have: Tiredness. Forgetfulness about what happened after the procedure. Impaired judgment for important decisions. Nausea or vomiting. Some difficulty with balance. Follow these instructions at home: For the time period you were told by your health care provider:   Rest as needed. Do not participate in activities where you could fall or become injured. Do not drive or use machinery. Do not drink alcohol. Do not take sleeping pills or medicines that cause drowsiness. Do not  make important decisions or sign legal documents. Do not take care of children on your own. Eating and drinking Follow the diet that is recommended by your health care provider. Drink enough fluid to keep your urine pale yellow. If you vomit: Drink water, juice, or soup when you can drink without vomiting. Make sure you have little or no nausea before eating solid foods. General instructions Have a responsible adult stay with you for the time you are told. It is important to have someone help care for you until you are awake and alert. Take over-the-counter and prescription medicines only as told by your health care provider. If you have sleep apnea, surgery and certain medicines can increase your risk for breathing problems. Follow instructions from your health care provider about wearing your sleep device: Anytime you are sleeping, including during daytime naps. While taking prescription pain medicines, sleeping medicines, or medicines that make you drowsy. Avoid smoking. Keep all follow-up visits as told by your health care provider. This is important. Contact a health care provider if: You keep feeling nauseous or you keep vomiting. You feel light-headed. You are still sleepy or having trouble with balance after 24 hours. You develop a rash. You have a fever. You have redness or swelling around the IV site. Get help right away if: You have trouble breathing. You have new-onset confusion at home. Summary For several hours after your procedure, you may feel tired. You may also be forgetful and have poor judgment. Have a responsible adult stay with you for the time you are told. It is important to have someone help care for you until you are awake and alert. Rest as told. Do not drive or operate machinery. Do not drink alcohol or take sleeping pills. Get help right away if you have trouble breathing, or if you suddenly become confused. This information is not intended to replace  advice given to you by your health care provider. Make sure you discuss any questions you have with your health care provider. Document Revised: 09/27/2019 Document Reviewed: 12/14/2018 Elsevier Patient Education  2022 [employer].

## 2021-03-22 LAB — BPAM RBC
Blood Product Expiration Date: 202303302359
Blood Product Expiration Date: 202303312359
ISSUE DATE / TIME: 202302241939
ISSUE DATE / TIME: 202302242203
Unit Type and Rh: 9500
Unit Type and Rh: 9500

## 2021-03-22 LAB — TYPE AND SCREEN
ABO/RH(D): O NEG
Antibody Screen: NEGATIVE
Unit division: 0
Unit division: 0

## 2021-03-23 ENCOUNTER — Telehealth: Payer: Self-pay | Admitting: Gastroenterology

## 2021-03-23 ENCOUNTER — Encounter (HOSPITAL_COMMUNITY): Payer: Self-pay

## 2021-03-23 ENCOUNTER — Ambulatory Visit (HOSPITAL_COMMUNITY)
Admission: RE | Admit: 2021-03-23 | Discharge: 2021-03-23 | Disposition: A | Discharge status: Home / Self Care | Payer: Medicare Other | Attending: Internal Medicine | Admitting: Internal Medicine

## 2021-03-23 ENCOUNTER — Ambulatory Visit (HOSPITAL_COMMUNITY): Payer: Medicare Other | Admitting: Certified Registered Nurse Anesthetist

## 2021-03-23 ENCOUNTER — Ambulatory Visit (HOSPITAL_BASED_OUTPATIENT_CLINIC_OR_DEPARTMENT_OTHER): Payer: Medicare Other | Admitting: Certified Registered Nurse Anesthetist

## 2021-03-23 ENCOUNTER — Encounter (HOSPITAL_COMMUNITY)
Admission: RE | Disposition: A | Discharge status: Home / Self Care | Payer: Self-pay | Source: Home / Self Care | Attending: Internal Medicine

## 2021-03-23 DIAGNOSIS — K648 Other hemorrhoids: Secondary | ICD-10-CM

## 2021-03-23 DIAGNOSIS — Z79899 Other long term (current) drug therapy: Secondary | ICD-10-CM | POA: Diagnosis not present

## 2021-03-23 DIAGNOSIS — K31A19 Gastric intestinal metaplasia without dysplasia, unspecified site: Secondary | ICD-10-CM | POA: Diagnosis not present

## 2021-03-23 DIAGNOSIS — K317 Polyp of stomach and duodenum: Secondary | ICD-10-CM | POA: Diagnosis not present

## 2021-03-23 DIAGNOSIS — K2270 Barrett's esophagus without dysplasia: Secondary | ICD-10-CM | POA: Insufficient documentation

## 2021-03-23 DIAGNOSIS — I2510 Atherosclerotic heart disease of native coronary artery without angina pectoris: Secondary | ICD-10-CM | POA: Diagnosis not present

## 2021-03-23 DIAGNOSIS — K219 Gastro-esophageal reflux disease without esophagitis: Secondary | ICD-10-CM | POA: Diagnosis not present

## 2021-03-23 DIAGNOSIS — K319 Disease of stomach and duodenum, unspecified: Secondary | ICD-10-CM | POA: Diagnosis not present

## 2021-03-23 DIAGNOSIS — D509 Iron deficiency anemia, unspecified: Secondary | ICD-10-CM | POA: Diagnosis not present

## 2021-03-23 DIAGNOSIS — K3189 Other diseases of stomach and duodenum: Secondary | ICD-10-CM | POA: Diagnosis not present

## 2021-03-23 DIAGNOSIS — I509 Heart failure, unspecified: Secondary | ICD-10-CM | POA: Insufficient documentation

## 2021-03-23 DIAGNOSIS — K625 Hemorrhage of anus and rectum: Secondary | ICD-10-CM

## 2021-03-23 DIAGNOSIS — K2289 Other specified disease of esophagus: Secondary | ICD-10-CM | POA: Diagnosis not present

## 2021-03-23 DIAGNOSIS — I4891 Unspecified atrial fibrillation: Secondary | ICD-10-CM | POA: Insufficient documentation

## 2021-03-23 DIAGNOSIS — I110 Hypertensive heart disease with heart failure: Secondary | ICD-10-CM | POA: Insufficient documentation

## 2021-03-23 DIAGNOSIS — E119 Type 2 diabetes mellitus without complications: Secondary | ICD-10-CM | POA: Insufficient documentation

## 2021-03-23 DIAGNOSIS — Z955 Presence of coronary angioplasty implant and graft: Secondary | ICD-10-CM | POA: Insufficient documentation

## 2021-03-23 HISTORY — PX: ESOPHAGOGASTRODUODENOSCOPY (EGD) WITH PROPOFOL: SHX5813

## 2021-03-23 HISTORY — PX: BIOPSY: SHX5522

## 2021-03-23 HISTORY — PX: COLONOSCOPY WITH PROPOFOL: SHX5780

## 2021-03-23 LAB — CBC
HCT: 31.3 % — ABNORMAL LOW (ref 39.0–52.0)
Hemoglobin: 9 g/dL — ABNORMAL LOW (ref 13.0–17.0)
MCH: 22.2 pg — ABNORMAL LOW (ref 26.0–34.0)
MCHC: 28.8 g/dL — ABNORMAL LOW (ref 30.0–36.0)
MCV: 77.1 fL — ABNORMAL LOW (ref 80.0–100.0)
Platelets: 297 10*3/uL (ref 150–400)
RBC: 4.06 MIL/uL — ABNORMAL LOW (ref 4.22–5.81)
RDW: 21.4 % — ABNORMAL HIGH (ref 11.5–15.5)
WBC: 4.7 10*3/uL (ref 4.0–10.5)
nRBC: 0 % (ref 0.0–0.2)

## 2021-03-23 LAB — GLUCOSE, CAPILLARY: Glucose-Capillary: 102 mg/dL — ABNORMAL HIGH (ref 70–99)

## 2021-03-23 SURGERY — COLONOSCOPY WITH PROPOFOL
Anesthesia: General

## 2021-03-23 MED ORDER — PROPOFOL 500 MG/50ML IV EMUL
INTRAVENOUS | Status: DC | PRN
  Administered 2021-03-23: 200 ug/kg/min via INTRAVENOUS

## 2021-03-23 MED ORDER — PROPOFOL 10 MG/ML IV BOLUS
INTRAVENOUS | Status: DC | PRN
  Administered 2021-03-23: 60 mg via INTRAVENOUS
  Administered 2021-03-23: 30 mg via INTRAVENOUS
  Administered 2021-03-23: 50 mg via INTRAVENOUS
  Administered 2021-03-23: 70 mg via INTRAVENOUS
  Administered 2021-03-23: 50 mg via INTRAVENOUS

## 2021-03-23 MED ORDER — LACTATED RINGERS IV SOLN: INTRAVENOUS | Status: DC

## 2021-03-23 MED ORDER — LIDOCAINE 2% (20 MG/ML) 5 ML SYRINGE
INTRAMUSCULAR | Status: DC | PRN
  Administered 2021-03-23: 50 mg via INTRAVENOUS

## 2021-03-23 NOTE — Anesthesia Preprocedure Evaluation (Addendum)
Anesthesia Evaluation  Patient identified by MRN, date of birth, ID band Patient awake    Reviewed: Allergy & Precautions, NPO status , Patient's Chart, lab work & pertinent test results  Airway Mallampati: II  TM Distance: >3 FB Neck ROM: Full    Dental  (+) Dental Advisory Given, Teeth Intact   Pulmonary shortness of breath and with exertion, sleep apnea and Continuous Positive Airway Pressure Ventilation ,    Pulmonary exam normal breath sounds clear to auscultation       Cardiovascular Exercise Tolerance: Good hypertension, Pt. on medications + CAD and +CHF  + dysrhythmias Atrial Fibrillation + pacemaker  Rhythm:Regular Rate:Normal + Systolic murmurs 1. Left ventricular ejection fraction, by estimation, is 60 to 65%. The left ventricle has normal function. The left ventricle has no regional wall motion abnormalities. The left ventricular internal cavity size was  mildly dilated. There is mild left ventricular hypertrophy. Left ventricular diastolic parameters were normal.  2. Right ventricular systolic function is normal. The right ventricular size is normal. There is mildly elevated pulmonary artery systolic pressure. The estimated right ventricular systolic pressure is 94.5 mmHg.  3. The mitral valve is grossly normal. Mild mitral valve regurgitation.  4. The aortic valve is tricuspid. Aortic valve regurgitation is mild. Aortic regurgitation PHT measures 1124 msec.  5. Aortic dilatation noted. There is mild dilatation of the aortic root, measuring 40 mm.  6. The inferior vena cava is normal in size with greater than 50% respiratory variability, suggesting right atrial pressure of 3 mmHg.    Neuro/Psych    GI/Hepatic Neg liver ROS, GERD  Medicated and Controlled,  Endo/Other  diabetes, Well Controlled, Type 2, Oral Hypoglycemic Agents  Renal/GU negative Renal ROS     Musculoskeletal  (+) Arthritis , Osteoarthritis,     Abdominal   Peds  Hematology   Anesthesia Other Findings   Reproductive/Obstetrics                            Anesthesia Physical Anesthesia Plan  ASA: 3  Anesthesia Plan: General   Post-op Pain Management: Minimal or no pain anticipated   Induction: Intravenous  PONV Risk Score and Plan: TIVA  Airway Management Planned: Nasal Cannula, Natural Airway and Simple Face Mask  Additional Equipment:   Intra-op Plan:   Post-operative Plan:   Informed Consent: I have reviewed the patients History and Physical, chart, labs and discussed the procedure including the risks, benefits and alternatives for the proposed anesthesia with the patient or authorized representative who has indicated his/her understanding and acceptance.     Dental advisory given  Plan Discussed with: CRNA and Surgeon  Anesthesia Plan Comments:         Anesthesia Quick Evaluation

## 2021-03-23 NOTE — Interval H&P Note (Signed)
History and Physical Interval Note:  03/23/2021 12:16 PM  Lawrence Jordan  has presented today for surgery, with the diagnosis of ida.  The various methods of treatment have been discussed with the patient and family. After consideration of risks, benefits and other options for treatment, the patient has consented to  Procedure(s) with comments: COLONOSCOPY WITH PROPOFOL (N/A) - 1:15pm ESOPHAGOGASTRODUODENOSCOPY (EGD) WITH PROPOFOL (N/A) as a surgical intervention.  The patient's history has been reviewed, patient examined, no change in status, stable for surgery.  I have reviewed the patient's chart and labs.  Questions were answered to the patient's satisfaction.     [name]

## 2021-03-23 NOTE — Op Note (Signed)
[facility] Patient Name: Lawrence Jordan Procedure Date: 03/23/2021 12:42 PM MRN: [id number] Date of Birth: 1950-09-26 Attending MD: Elon Alas. Edgar Frisk CSN: [id number] Age: 71 Admit Type: Outpatient Procedure:                Colonoscopy Indications:              Rectal bleeding, Iron deficiency anemia Providers:                Elon Alas. Abbey Chatters, DO, Janeece Riggers, RN, Kristine L.                            Risa Grill, Technician Referring MD:              Medicines:                See the Anesthesia note for documentation of the                            administered medications Complications:            No immediate complications. Estimated Blood Loss:     Estimated blood loss: none. Procedure:                Pre-Anesthesia Assessment:                           - The anesthesia plan was to use monitored                            anesthesia care (MAC).                           After obtaining informed consent, the colonoscope                            was passed under direct vision. Throughout the                            procedure, the patient's blood pressure, pulse, and                            oxygen saturations were monitored continuously. The                            [id number]-HQ190L ([id number]) scope was introduced through                            the anus and advanced to the the cecum, identified                            by appendiceal orifice and ileocecal valve. The                            colonoscopy was performed without difficulty. The                            patient tolerated the procedure well.  The quality                            of the bowel preparation was evaluated using the                            BBPS [facility] Bowel Preparation Scale) with scores                            of: Right Colon = 2 (minor amount of residual                            staining, small fragments of stool and/or opaque                            liquid, but mucosa  seen well), Transverse Colon = 2                            (minor amount of residual staining, small fragments                            of stool and/or opaque liquid, but mucosa seen                            well) and Left Colon = 2 (minor amount of residual                            staining, small fragments of stool and/or opaque                            liquid, but mucosa seen well). The total BBPS score                            equals 6. The quality of the bowel preparation was                            fair. Scope In: 12:45:27 PM Scope Out: 1:02:42 PM Scope Withdrawal Time: 0 hours 12 minutes 9 seconds  Total Procedure Duration: 0 hours 17 minutes 15 seconds  Findings:      Hemorrhoids were found on perianal exam.      Non-bleeding internal hemorrhoids were found during retroflexion.      A moderate amount of stool was found in the entire colon, making       visualization difficult. Lavage of the area was performed using copious       amounts of sterile water, resulting in clearance with fair visualization. Impression:               - Preparation of the colon was fair.                           - Hemorrhoids found on perianal exam.                           -  Non-bleeding internal hemorrhoids.                           - Stool in the entire examined colon.                           - No specimens collected. Moderate Sedation:      Per Anesthesia Care Recommendation:           - Patient has a contact number available for                            emergencies. The signs and symptoms of potential                            delayed complications were discussed with the                            patient. Return to normal activities tomorrow.                            Written discharge instructions were provided to the                            patient.                           - Resume previous diet.                           - Continue present medications.                            - Repeat colonoscopy in 10 years for screening                            purposes.                           - Return to GI clinic at the next available                            appointment with Roseanne Kaufman.                           - Okay to resume Eliquis from GI standpoint.                           - Consider capsule endoscopy for completeness. Procedure Code(s):        --- Professional ---                           [phone], Colonoscopy, flexible; diagnostic, including                            collection of specimen(s) by brushing or washing,  when performed (separate procedure) Diagnosis Code(s):        --- Professional ---                           K64.8, Other hemorrhoids                           K62.5, Hemorrhage of anus and rectum                           D50.9, Iron deficiency anemia, unspecified CPT copyright 2019 American Medical Association. All rights reserved. The codes documented in this report are preliminary and upon coder review may  be revised to meet current compliance requirements. Elon Alas. Abbey Chatters, DO [place] Abbey Chatters, DO 03/23/2021 1:09:59 PM This report has been signed electronically. Number of Addenda: 0

## 2021-03-23 NOTE — Op Note (Signed)
[facility] Patient Name: Lawrence Jordan Procedure Date: 03/23/2021 12:23 PM MRN: [id number] Date of Birth: [birth year] Attending MD: Elon Alas. Edgar Frisk CSN: [id number] Age: 71 Admit Type: Outpatient Procedure:                Upper GI endoscopy Indications:              Iron deficiency anemia, Suspected Barrett's                            esophagus Providers:                Elon Alas. Abbey Chatters, DO, Janeece Riggers, RN, Lambert Mody, Kristine L. Risa Grill, Technician Referring MD:              Medicines:                See the Anesthesia note for documentation of the                            administered medications Complications:            No immediate complications. Estimated Blood Loss:     Estimated blood loss was minimal. Procedure:                Pre-Anesthesia Assessment:                           - The anesthesia plan was to use monitored                            anesthesia care (MAC).                           After obtaining informed consent, the endoscope was                            passed under direct vision. Throughout the                            procedure, the patient's blood pressure, pulse, and                            oxygen saturations were monitored continuously. The                            [id number]-[id number] ([id number]) scope was introduced through the                            mouth, and advanced to the second part of duodenum.                            The upper GI endoscopy was accomplished without                            difficulty. The patient tolerated  the procedure                            well. Scope In: 12:33:01 PM Scope Out: 12:40:25 PM Total Procedure Duration: 0 hours 7 minutes 24 seconds  Findings:      The esophagus and gastroesophageal junction were examined with white       light and narrow band imaging (NBI) from a forward view and retroflexed       position. There were esophageal mucosal changes  consistent with       long-segment Barrett's esophagus. These changes involved the mucosa at       the upper extent of the gastric folds (40 cm from the incisors)       extending to the Z-line (29 cm from the incisors). Circumferential       salmon-colored mucosa was present. The maximum longitudinal extent of       these esophageal mucosal changes was 11 cm in length. Biopsies were       taken with a cold forceps for histology.      A single 4 mm sessile polyp with stigmata of recent bleeding was found       in the gastric body. The polyp was removed with a cold biopsy forceps.       Resection and retrieval were complete.      The duodenal bulb, first portion of the duodenum and second portion of       the duodenum were normal. Impression:               - Esophageal mucosal changes consistent with                            long-segment Barrett's esophagus. Biopsied.                           - A single gastric polyp. Resected and retrieved.                           - Normal duodenal bulb, first portion of the                            duodenum and second portion of the duodenum. Moderate Sedation:      Per Anesthesia Care Recommendation:           - Patient has a contact number available for                            emergencies. The signs and symptoms of potential                            delayed complications were discussed with the                            patient. Return to normal activities tomorrow.                            Written discharge instructions were provided to the  patient.                           - Resume previous diet.                           - Continue present medications.                           - Await pathology results.                           - Use Protonix (pantoprazole) 40 mg PO daily.                           - Repeat upper endoscopy for surveillance based on                            pathology results.                            - Return to GI clinic in 3 months. Procedure Code(s):        --- Professional ---                           [phone], Esophagogastroduodenoscopy, flexible,                            transoral; with biopsy, single or multiple Diagnosis Code(s):        --- Professional ---                           K22.8, Other specified diseases of esophagus                           K31.7, Polyp of stomach and duodenum                           D50.9, Iron deficiency anemia, unspecified CPT copyright 2019 American Medical Association. All rights reserved. The codes documented in this report are preliminary and upon coder review may  be revised to meet current compliance requirements. Elon Alas. Abbey Chatters, DO [place] Abbey Chatters, DO 03/23/2021 12:44:44 PM This report has been signed electronically. Number of Addenda: 0

## 2021-03-23 NOTE — Anesthesia Postprocedure Evaluation (Signed)
Anesthesia Post Note  Patient: Lawrence Jordan  Procedure(s) Performed: COLONOSCOPY WITH PROPOFOL ESOPHAGOGASTRODUODENOSCOPY (EGD) WITH PROPOFOL BIOPSY  Patient location during evaluation: Short Stay Anesthesia Type: General Level of consciousness: awake and alert Pain management: pain level controlled Vital Signs Assessment: post-procedure vital signs reviewed and stable Respiratory status: spontaneous breathing Cardiovascular status: blood pressure returned to baseline and stable Postop Assessment: no apparent nausea or vomiting Anesthetic complications: no   No notable events documented.   Last Vitals:  Vitals:   03/23/21 1202 03/23/21 1307  BP: 131/71 (!) 97/55  Pulse: (!) 55   Resp: 18 (!) 28  Temp: 37.2 C 37.3 C  SpO2: 96% 96%    Last Pain:  Vitals:   03/23/21 1307  TempSrc: Axillary  PainSc: 0-No pain                 Iren Whipp

## 2021-03-23 NOTE — Discharge Instructions (Addendum)
EGD Discharge instructions Please read the instructions outlined below and refer to this sheet in the next few weeks. These discharge instructions provide you with general information on caring for yourself after you leave the hospital. Your doctor may also give you specific instructions. While your treatment has been planned according to the most current medical practices available, unavoidable complications occasionally occur. If you have any problems or questions after discharge, please call your doctor. ACTIVITY You may resume your regular activity but move at a slower pace for the next 24 hours.  Take frequent rest periods for the next 24 hours.  Walking will help expel (get rid of) the air and reduce the bloated feeling in your abdomen.  No driving for 24 hours (because of the anesthesia (medicine) used during the test).  You may shower.  Do not sign any important legal documents or operate any machinery for 24 hours (because of the anesthesia used during the test).  NUTRITION Drink plenty of fluids.  You may resume your normal diet.  Begin with a light meal and progress to your normal diet.  Avoid alcoholic beverages for 24 hours or as instructed by your caregiver.  MEDICATIONS You may resume your normal medications unless your caregiver tells you otherwise.  WHAT YOU CAN EXPECT TODAY You may experience abdominal discomfort such as a feeling of fullness or gas pains.  FOLLOW-UP Your doctor will discuss the results of your test with you.  SEEK IMMEDIATE MEDICAL ATTENTION IF ANY OF THE FOLLOWING OCCUR: Excessive nausea (feeling sick to your stomach) and/or vomiting.  Severe abdominal pain and distention (swelling).  Trouble swallowing.  Temperature over 101 F (37.8 C).  Rectal bleeding or vomiting of blood.    Colonoscopy Discharge Instructions  Read the instructions outlined below and refer to this sheet in the next few weeks. These discharge instructions provide you with  general information on caring for yourself after you leave the hospital. Your doctor may also give you specific instructions. While your treatment has been planned according to the most current medical practices available, unavoidable complications occasionally occur.   ACTIVITY You may resume your regular activity, but move at a slower pace for the next 24 hours.  Take frequent rest periods for the next 24 hours.  Walking will help get rid of the air and reduce the bloated feeling in your belly (abdomen).  No driving for 24 hours (because of the medicine (anesthesia) used during the test).   Do not sign any important legal documents or operate any machinery for 24 hours (because of the anesthesia used during the test).  NUTRITION Drink plenty of fluids.  You may resume your normal diet as instructed by your doctor.  Begin with a light meal and progress to your normal diet. Heavy or fried foods are harder to digest and may make you feel sick to your stomach (nauseated).  Avoid alcoholic beverages for 24 hours or as instructed.  MEDICATIONS You may resume your normal medications unless your doctor tells you otherwise.  WHAT YOU CAN EXPECT TODAY Some feelings of bloating in the abdomen.  Passage of more gas than usual.  Spotting of blood in your stool or on the toilet paper.  IF YOU HAD POLYPS REMOVED DURING THE COLONOSCOPY: No aspirin products for 7 days or as instructed.  No alcohol for 7 days or as instructed.  Eat a soft diet for the next 24 hours.  FINDING OUT THE RESULTS OF YOUR TEST Not all test results are available  during your visit. If your test results are not back during the visit, make an appointment with your caregiver to find out the results. Do not assume everything is normal if you have not heard from your caregiver or the medical facility. It is important for you to follow up on all of your test results.  SEEK IMMEDIATE MEDICAL ATTENTION IF: You have more than a spotting of  blood in your stool.  Your belly is swollen (abdominal distention).  You are nauseated or vomiting.  You have a temperature over 101.  You have abdominal pain or discomfort that is severe or gets worse throughout the day.   Your EGD showed Barrett's esophagus, long segment, measuring approximately 10 to 11 cm.  I took extensive biopsies of this area.  Continue on pantoprazole daily.  Colonoscopy was relatively unremarkable.  I did not find a polyps or evidence of colon cancer.  You do have large internal hemorrhoids, appeared that 1 has recently bled.  Would recommend banding in our clinic which we can set you up for if amenable.  Continue on iron supplementation.  I hope you have a great rest of your week!  Elon Alas. Abbey Chatters, D.O. Gastroenterology and Hepatology [facility]

## 2021-03-23 NOTE — Transfer of Care (Signed)
Immediate Anesthesia Transfer of Care Note  Patient: Murvin Natal  Procedure(s) Performed: COLONOSCOPY WITH PROPOFOL ESOPHAGOGASTRODUODENOSCOPY (EGD) WITH PROPOFOL BIOPSY  Patient Location: Short Stay  Anesthesia Type:General  Level of Consciousness: awake  Airway & Oxygen Therapy: Patient Spontanous Breathing  Post-op Assessment: Report given to RN  Post vital signs: Reviewed and stable  Last Vitals:  Vitals Value Taken Time  BP 97/55 03/23/21 1307  Temp 37.3 C 03/23/21 1307  Pulse    Resp 28 03/23/21 1307  SpO2 96 % 03/23/21 1307    Last Pain:  Vitals:   03/23/21 1307  TempSrc: Axillary  PainSc: 0-No pain      Patients Stated Pain Goal: 5 (00/12/39 3594)  Complications: No notable events documented.

## 2021-03-23 NOTE — Telephone Encounter (Signed)
Lawrence Jordan, please put in a hemorrhoid banding slot with me. Thanks!

## 2021-03-24 DIAGNOSIS — I10 Essential (primary) hypertension: Secondary | ICD-10-CM | POA: Diagnosis not present

## 2021-03-24 DIAGNOSIS — E1165 Type 2 diabetes mellitus with hyperglycemia: Secondary | ICD-10-CM | POA: Diagnosis not present

## 2021-03-24 LAB — SURGICAL PATHOLOGY

## 2021-03-26 ENCOUNTER — Encounter (HOSPITAL_COMMUNITY): Payer: Self-pay | Admitting: Internal Medicine

## 2021-04-02 ENCOUNTER — Other Ambulatory Visit: Payer: Self-pay

## 2021-04-02 ENCOUNTER — Encounter: Payer: Self-pay | Admitting: Gastroenterology

## 2021-04-02 ENCOUNTER — Telehealth: Payer: Self-pay

## 2021-04-02 ENCOUNTER — Other Ambulatory Visit (HOSPITAL_COMMUNITY)
Admission: RE | Admit: 2021-04-02 | Discharge: 2021-04-02 | Disposition: A | Discharge status: Home / Self Care | Payer: Medicare Other | Source: Ambulatory Visit | Attending: Gastroenterology | Admitting: Gastroenterology

## 2021-04-02 ENCOUNTER — Ambulatory Visit (INDEPENDENT_AMBULATORY_CARE_PROVIDER_SITE_OTHER): Payer: Medicare Other | Admitting: Gastroenterology

## 2021-04-02 VITALS — BP 128/68 | HR 80 | Temp 97.8°F | Ht 71.0 in | Wt 281.8 lb

## 2021-04-02 DIAGNOSIS — D62 Acute posthemorrhagic anemia: Secondary | ICD-10-CM

## 2021-04-02 LAB — IRON AND TIBC
Iron: 17 ug/dL — ABNORMAL LOW (ref 45–182)
Saturation Ratios: 3 % — ABNORMAL LOW (ref 17.9–39.5)
TIBC: 559 ug/dL — ABNORMAL HIGH (ref 250–450)
UIBC: 542 ug/dL

## 2021-04-02 LAB — COMPREHENSIVE METABOLIC PANEL
ALT: 14 U/L (ref 0–44)
AST: 17 U/L (ref 15–41)
Albumin: 4.3 g/dL (ref 3.5–5.0)
Alkaline Phosphatase: 57 U/L (ref 38–126)
Anion gap: 8 (ref 5–15)
BUN: 14 mg/dL (ref 8–23)
CO2: 23 mmol/L (ref 22–32)
Calcium: 9.5 mg/dL (ref 8.9–10.3)
Chloride: 105 mmol/L (ref 98–111)
Creatinine, Ser: 0.77 mg/dL (ref 0.61–1.24)
GFR, Estimated: >60 mL/min (ref >60)
Glucose, Bld: 160 mg/dL — ABNORMAL HIGH (ref 70–99)
Potassium: 4 mmol/L (ref 3.5–5.1)
Sodium: 136 mmol/L (ref 135–145)
Total Bilirubin: 0.5 mg/dL (ref 0.3–1.2)
Total Protein: 7.6 g/dL (ref 6.5–8.1)

## 2021-04-02 LAB — CBC WITH DIFFERENTIAL/PLATELET
Abs Immature Granulocytes: 0.01 10*3/uL (ref 0.00–0.07)
Basophils Absolute: 0 10*3/uL (ref 0.0–0.1)
Basophils Relative: 1 %
Eosinophils Absolute: 0.1 10*3/uL (ref 0.0–0.5)
Eosinophils Relative: 3 %
HCT: 29.3 % — ABNORMAL LOW (ref 39.0–52.0)
Hemoglobin: 8.3 g/dL — ABNORMAL LOW (ref 13.0–17.0)
Immature Granulocytes: 0 %
Lymphocytes Relative: 27 %
Lymphs Abs: 1.2 10*3/uL (ref 0.7–4.0)
MCH: 21.7 pg — ABNORMAL LOW (ref 26.0–34.0)
MCHC: 28.3 g/dL — ABNORMAL LOW (ref 30.0–36.0)
MCV: 76.5 fL — ABNORMAL LOW (ref 80.0–100.0)
Monocytes Absolute: 0.4 10*3/uL (ref 0.1–1.0)
Monocytes Relative: 9 %
Neutro Abs: 2.6 10*3/uL (ref 1.7–7.7)
Neutrophils Relative %: 60 %
Platelets: 238 10*3/uL (ref 150–400)
RBC: 3.83 MIL/uL — ABNORMAL LOW (ref 4.22–5.81)
RDW: 21.6 % — ABNORMAL HIGH (ref 11.5–15.5)
WBC Morphology: REACTIVE
WBC: 4.3 10*3/uL (ref 4.0–10.5)
nRBC: 0 % (ref 0.0–0.2)

## 2021-04-02 LAB — FERRITIN: Ferritin: 4 ng/mL — ABNORMAL LOW (ref 24–336)

## 2021-04-02 NOTE — Patient Instructions (Signed)
Please go to [place] entrance and let them know you have blood work to be drawn (we are giving you orders). ? ?We will be in touch with the next steps! ? ?Go Pack! ? ?It was a pleasure to see you today. I want to create trusting relationships with patients to provide genuine, compassionate, and quality care. I value your feedback. If you receive a survey regarding your visit,  I greatly appreciate you taking time to fill this out.  ? ?Annitta Needs, PhD, ANP-BC ?[facility]  ? ? ?

## 2021-04-02 NOTE — Telephone Encounter (Signed)
This would be fine.  

## 2021-04-02 NOTE — Telephone Encounter (Signed)
Dr [name],  ?  Roseanne Kaufman NP is wanting to know if this patient would be ok to undergo capsule study without being monitored on telemetry? ?
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
## 2021-04-06 ENCOUNTER — Encounter: Payer: Self-pay | Admitting: Gastroenterology

## 2021-04-06 ENCOUNTER — Encounter: Payer: Self-pay | Admitting: Cardiology

## 2021-04-06 ENCOUNTER — Other Ambulatory Visit: Payer: Self-pay

## 2021-04-06 DIAGNOSIS — D509 Iron deficiency anemia, unspecified: Secondary | ICD-10-CM

## 2021-04-06 DIAGNOSIS — R790 Abnormal level of blood mineral: Secondary | ICD-10-CM

## 2021-04-07 DIAGNOSIS — H25812 Combined forms of age-related cataract, left eye: Secondary | ICD-10-CM | POA: Diagnosis not present

## 2021-04-07 DIAGNOSIS — H401213 Low-tension glaucoma, right eye, severe stage: Secondary | ICD-10-CM | POA: Diagnosis not present

## 2021-04-07 DIAGNOSIS — H401222 Low-tension glaucoma, left eye, moderate stage: Secondary | ICD-10-CM | POA: Diagnosis not present

## 2021-04-07 DIAGNOSIS — H25811 Combined forms of age-related cataract, right eye: Secondary | ICD-10-CM | POA: Diagnosis not present

## 2021-04-08 DIAGNOSIS — K625 Hemorrhage of anus and rectum: Secondary | ICD-10-CM | POA: Diagnosis not present

## 2021-04-08 DIAGNOSIS — D649 Anemia, unspecified: Secondary | ICD-10-CM | POA: Diagnosis not present

## 2021-04-08 DIAGNOSIS — R531 Weakness: Secondary | ICD-10-CM | POA: Diagnosis not present

## 2021-04-08 DIAGNOSIS — R0602 Shortness of breath: Secondary | ICD-10-CM | POA: Diagnosis not present

## 2021-04-08 DIAGNOSIS — I4891 Unspecified atrial fibrillation: Secondary | ICD-10-CM | POA: Diagnosis not present

## 2021-04-09 ENCOUNTER — Telehealth: Payer: Self-pay | Admitting: Physician Assistant

## 2021-04-09 NOTE — Telephone Encounter (Signed)
Scheduled appt per 3/13 referral. Pt is aware of appt date and time. Pt is aware to arrive 15 mins prior to appt time and to bring and updated insurance card. Pt is aware of appt location.   ?

## 2021-04-13 DIAGNOSIS — K625 Hemorrhage of anus and rectum: Secondary | ICD-10-CM | POA: Diagnosis not present

## 2021-04-13 DIAGNOSIS — R531 Weakness: Secondary | ICD-10-CM | POA: Diagnosis not present

## 2021-04-13 DIAGNOSIS — I4891 Unspecified atrial fibrillation: Secondary | ICD-10-CM | POA: Diagnosis not present

## 2021-04-13 DIAGNOSIS — R0602 Shortness of breath: Secondary | ICD-10-CM | POA: Diagnosis not present

## 2021-04-20 ENCOUNTER — Telehealth: Payer: Self-pay | Admitting: Physician Assistant

## 2021-04-20 ENCOUNTER — Ambulatory Visit (HOSPITAL_COMMUNITY)
Admission: RE | Admit: 2021-04-20 | Discharge: 2021-04-20 | Disposition: A | Discharge status: Home / Self Care | Payer: Medicare Other | Attending: Internal Medicine | Admitting: Internal Medicine

## 2021-04-20 ENCOUNTER — Encounter (HOSPITAL_COMMUNITY): Payer: Self-pay

## 2021-04-20 ENCOUNTER — Encounter (HOSPITAL_COMMUNITY)
Admission: RE | Disposition: A | Discharge status: Home / Self Care | Payer: Self-pay | Source: Home / Self Care | Attending: Internal Medicine

## 2021-04-20 DIAGNOSIS — D62 Acute posthemorrhagic anemia: Secondary | ICD-10-CM | POA: Insufficient documentation

## 2021-04-20 DIAGNOSIS — K2270 Barrett's esophagus without dysplasia: Secondary | ICD-10-CM | POA: Diagnosis not present

## 2021-04-20 DIAGNOSIS — D500 Iron deficiency anemia secondary to blood loss (chronic): Secondary | ICD-10-CM

## 2021-04-20 DIAGNOSIS — Z7901 Long term (current) use of anticoagulants: Secondary | ICD-10-CM | POA: Insufficient documentation

## 2021-04-20 DIAGNOSIS — D509 Iron deficiency anemia, unspecified: Secondary | ICD-10-CM | POA: Diagnosis not present

## 2021-04-20 DIAGNOSIS — Z79899 Other long term (current) drug therapy: Secondary | ICD-10-CM | POA: Insufficient documentation

## 2021-04-20 DIAGNOSIS — K649 Unspecified hemorrhoids: Secondary | ICD-10-CM | POA: Diagnosis not present

## 2021-04-20 HISTORY — PX: GIVENS CAPSULE STUDY: SHX5432

## 2021-04-20 SURGERY — IMAGING PROCEDURE, GI TRACT, INTRALUMINAL, VIA CAPSULE

## 2021-04-20 MED ORDER — SODIUM CHLORIDE 0.9 % IV SOLN: INTRAVENOUS | Status: DC

## 2021-04-20 NOTE — H&P (Signed)
Patient presenting today for capsule endoscopy for history of iron deficiency anemia.  ?

## 2021-04-20 NOTE — Telephone Encounter (Signed)
R/s pt's new hem appt per pt request. Pt is aware of new appt date and time.  ?

## 2021-04-21 ENCOUNTER — Other Ambulatory Visit: Payer: Self-pay | Admitting: Endocrinology

## 2021-04-21 ENCOUNTER — Encounter (HOSPITAL_COMMUNITY): Payer: Self-pay | Admitting: Internal Medicine

## 2021-04-22 ENCOUNTER — Inpatient Hospital Stay: Payer: Medicare Other | Admitting: Physician Assistant

## 2021-04-22 ENCOUNTER — Inpatient Hospital Stay: Payer: Medicare Other

## 2021-04-22 ENCOUNTER — Encounter: Payer: Self-pay | Admitting: Endocrinology

## 2021-04-23 NOTE — Op Note (Signed)
Small Bowel Givens Capsule Study ?Procedure date:  04/20/21 ? ?Referring Provider:  Roseanne Kaufman, PhD, ANP-BC ?PCP:  Dr. Jimmye Norman, Drema Dallas, MD ? ?Indication for procedure:  IDA ? ?Patient data:  ?Wt: 281.7 lbs ?Ht: '5\' 11"'$  ? ? ?Findings:  ?Area of petechiation/erosion in the stomach at 00:23:57.  Small gastric polyp at 00:50:57.   ? ?Appeared capsule may have been in transition between stomach and duodenum for several minutes, but definitely in the small bowel at 00:51:49. Areas and erosions without active bleeding in the small bowel, best appreciated at 01:19:38-01:22:02, 02:18:06 .  Tiny angioectasia vs tiny erosions at 00:55:49, 00:57:05, 01:31:23, 04:29:53.  Scattered benign-appearing lymphangiectasia's.  No active bleeding. Study complete to the cecum.  ? ?First Gastric image:  00:01:09 ?First Duodenal image: 00:51:49 ?First Cecal image: 04:32:57 ?Gastric Passage time: 0h 45m?Small Bowel Passage time:  3h 439m ?Summary: ?[age].o. male with history of Barrett's esophagus, rectal bleeding, on chronic anticoagulation (Eliquis), acute blood loss anemia with Hgb 6.9 in Feb 2023 requiring 2 units PRBCs and undergoing EGD and colonoscopy at that time revealing nonbleeding hemorrhoids, moderate amount of stool in the entire colon and s/p lavage resulting in clearance with fair visualization, Barrett's esophagus, single gastric hyperplastic polyp resected, normal examined duodenum. He has started hemorrhoid banding as it was suspected that anemia may in part be related to hemorrhoidal bleeding in the setting of anticoagulation. Found to have decline in Hgb to 8.3 in March from 9.0 following PRBCs in February, and significant iron deficiency with ferritin 4, iron 17, saturation 3%. He was referred to hematology and scheduled for Given's for complete evaluation.  ? ?Given's capsule with area of petechiation and erosion in the stomach as well as scattered area of petechiation, erosions, and possible tiny AMVs in the small  bowel as per above which very well may be contributing to IDA in the setting of anticoagulation.  ? ?He does have a small polyp at the pylorus.  Discussed with Dr. CaAbbey Chatters Suspect this is likely hyperplastic as EGD in February also revealed small hyperplastic polyp.  No need for repeat EGD. ? ? ?Recommendations: ?Increase Protonix to 40 mg twice daily x 12 weeks then decrease to once daily.  ?Avoid all NSAIDs.  ?Keep upcoming appointment with hematology for IDA/iron infusions.  ?Continue close monitoring of H/H.  ?Follow-up with AnRoseanne KaufmanPhD, ANP-BC in 3 months or sooner if needed.  ? ? ?KrAliene AltesPA-C ?[facility] ? ? ?

## 2021-04-24 ENCOUNTER — Telehealth: Payer: Self-pay | Admitting: Gastroenterology

## 2021-04-24 DIAGNOSIS — D500 Iron deficiency anemia secondary to blood loss (chronic): Secondary | ICD-10-CM

## 2021-04-24 DIAGNOSIS — E1165 Type 2 diabetes mellitus with hyperglycemia: Secondary | ICD-10-CM | POA: Diagnosis not present

## 2021-04-24 DIAGNOSIS — I10 Essential (primary) hypertension: Secondary | ICD-10-CM | POA: Diagnosis not present

## 2021-04-24 DIAGNOSIS — D509 Iron deficiency anemia, unspecified: Secondary | ICD-10-CM

## 2021-04-24 NOTE — Telephone Encounter (Signed)
Courtney, please relay the following to the patient regarding his Given's Capsule Results. ? ?Given's capsule with an area of petechiation and erosion in the stomach as well as scattered area of petechiation, erosions, and possible tiny AMVs in the small bowel which very well may be contributing to IDA in the setting of anticoagulation.  ? ?He does have a small polyp in his stomach.  Discussed with Dr. Abbey Chatters.  Suspect this is likely hyperplastic (benign) as EGD in February also revealed small hyperplastic polyp.  No need for repeat EGD. ? ? ?Recommendations: ?Increase Protonix to 40 mg twice daily x 12 weeks then decrease to once daily. I will send in a new Rx for him.  ?Avoid all NSAIDs.  ?Keep upcoming appointment with hematology for IDA/iron infusions.  ?Follow-up with Roseanne Kaufman, PhD, ANP-BC in 3 months or sooner if needed.  ? ? ?Stacey: Please arrange F/U with Vicente Males in 3 months for IDA.  ? ? ?Clayborn Heron. Requesting follow up with you in 3 months unless you want sooner return for hemorrhoids/banding?  ? ?

## 2021-04-27 NOTE — Telephone Encounter (Signed)
Noted. He does not need sooner appointment with me. Would not advise banding if no rectal bleeding, as he is at higher risk of post-procedure complications on anticoagulation. We can reconsider this if overt GI bleeding. Keep upcoming appt with Hematology.  ?

## 2021-04-28 ENCOUNTER — Encounter: Payer: Self-pay | Admitting: Internal Medicine

## 2021-04-28 ENCOUNTER — Other Ambulatory Visit: Payer: Self-pay | Admitting: Gastroenterology

## 2021-04-28 ENCOUNTER — Telehealth: Payer: Self-pay | Admitting: *Deleted

## 2021-04-28 DIAGNOSIS — K219 Gastro-esophageal reflux disease without esophagitis: Secondary | ICD-10-CM

## 2021-04-28 DIAGNOSIS — K2970 Gastritis, unspecified, without bleeding: Secondary | ICD-10-CM

## 2021-04-28 MED ORDER — PANTOPRAZOLE SODIUM 40 MG PO TBEC: DELAYED_RELEASE_TABLET | ORAL | 2 refills | Status: DC

## 2021-04-28 NOTE — Telephone Encounter (Addendum)
Spoke to pt, informed him of results and recommendations. Pt voiced understanding. Informed to increase Protonix to twice daily for 12 weeks. Then go back to 1 daily. Also avoid NSAID's and keep appointment with hematology. Informed to call office if any problem or concerns.   ?

## 2021-04-28 NOTE — Telephone Encounter (Signed)
Rx sent. Per Loma Sousa, patient is at the [facility] and will be there for "a while".  ?

## 2021-04-28 NOTE — Telephone Encounter (Signed)
These medications are fine. What prescriptions is he wanting sent in?  ?

## 2021-04-28 NOTE — Telephone Encounter (Signed)
Protonix.  ?

## 2021-04-28 NOTE — Telephone Encounter (Signed)
Pt called again inquiring about medications that he is taking for pain. Baclofen '10mg'$  and Ultracet 37.5-'325mg'$ . He is wondering if it is ok to take since he is not supposes to take any NSAID's. Also he wants prescriptions sent to [facility] [place], [place], [id number] ?

## 2021-04-29 NOTE — Telephone Encounter (Signed)
Spoke to pt, informed him that it was fine to take Baclofen and Ultracet. Also, [place]  ?

## 2021-04-30 ENCOUNTER — Telehealth: Payer: Self-pay | Admitting: Physician Assistant

## 2021-04-30 NOTE — Telephone Encounter (Signed)
R/s pt's appt per pt request. Pt is aware of new appt date and time.  ?

## 2021-05-01 ENCOUNTER — Encounter: Payer: Medicare Other | Admitting: Internal Medicine

## 2021-05-04 ENCOUNTER — Telehealth: Payer: Self-pay | Admitting: *Deleted

## 2021-05-04 NOTE — Telephone Encounter (Signed)
Received approval letter from [facility] for Pantoprazole. Letter sent to scan center.   ?

## 2021-05-05 ENCOUNTER — Other Ambulatory Visit: Payer: Medicare Other

## 2021-05-05 ENCOUNTER — Encounter: Payer: Medicare Other | Admitting: Physician Assistant
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
## 2021-05-14 ENCOUNTER — Other Ambulatory Visit: Payer: Self-pay | Admitting: Physician Assistant

## 2021-05-14 ENCOUNTER — Encounter: Payer: Self-pay | Admitting: Physician Assistant

## 2021-05-14 ENCOUNTER — Other Ambulatory Visit: Payer: Self-pay

## 2021-05-14 ENCOUNTER — Inpatient Hospital Stay: Payer: Medicare Other | Attending: Physician Assistant | Admitting: Physician Assistant

## 2021-05-14 ENCOUNTER — Inpatient Hospital Stay: Payer: Medicare Other

## 2021-05-14 VITALS — BP 151/69 | HR 58 | Temp 97.2°F | Resp 18 | Wt 292.1 lb

## 2021-05-14 DIAGNOSIS — D509 Iron deficiency anemia, unspecified: Secondary | ICD-10-CM | POA: Diagnosis not present

## 2021-05-14 DIAGNOSIS — I2510 Atherosclerotic heart disease of native coronary artery without angina pectoris: Secondary | ICD-10-CM | POA: Diagnosis not present

## 2021-05-14 DIAGNOSIS — K219 Gastro-esophageal reflux disease without esophagitis: Secondary | ICD-10-CM | POA: Diagnosis not present

## 2021-05-14 DIAGNOSIS — Z794 Long term (current) use of insulin: Secondary | ICD-10-CM | POA: Insufficient documentation

## 2021-05-14 DIAGNOSIS — Z79899 Other long term (current) drug therapy: Secondary | ICD-10-CM | POA: Diagnosis not present

## 2021-05-14 DIAGNOSIS — H409 Unspecified glaucoma: Secondary | ICD-10-CM | POA: Insufficient documentation

## 2021-05-14 DIAGNOSIS — D649 Anemia, unspecified: Secondary | ICD-10-CM

## 2021-05-14 DIAGNOSIS — K922 Gastrointestinal hemorrhage, unspecified: Secondary | ICD-10-CM

## 2021-05-14 DIAGNOSIS — I110 Hypertensive heart disease with heart failure: Secondary | ICD-10-CM | POA: Diagnosis not present

## 2021-05-14 DIAGNOSIS — K648 Other hemorrhoids: Secondary | ICD-10-CM | POA: Insufficient documentation

## 2021-05-14 DIAGNOSIS — Z20822 Contact with and (suspected) exposure to covid-19: Secondary | ICD-10-CM | POA: Diagnosis not present

## 2021-05-14 DIAGNOSIS — G4733 Obstructive sleep apnea (adult) (pediatric): Secondary | ICD-10-CM | POA: Diagnosis not present

## 2021-05-14 DIAGNOSIS — Z7984 Long term (current) use of oral hypoglycemic drugs: Secondary | ICD-10-CM | POA: Insufficient documentation

## 2021-05-14 DIAGNOSIS — D500 Iron deficiency anemia secondary to blood loss (chronic): Secondary | ICD-10-CM | POA: Diagnosis not present

## 2021-05-14 DIAGNOSIS — E7800 Pure hypercholesterolemia, unspecified: Secondary | ICD-10-CM | POA: Insufficient documentation

## 2021-05-14 DIAGNOSIS — E119 Type 2 diabetes mellitus without complications: Secondary | ICD-10-CM | POA: Insufficient documentation

## 2021-05-14 LAB — CBC WITH DIFFERENTIAL (CANCER CENTER ONLY)
Abs Immature Granulocytes: 0 10*3/uL (ref 0.00–0.07)
Basophils Absolute: 0.1 10*3/uL (ref 0.0–0.1)
Basophils Relative: 1 %
Eosinophils Absolute: 0.1 10*3/uL (ref 0.0–0.5)
Eosinophils Relative: 3 %
HCT: 35.8 % — ABNORMAL LOW (ref 39.0–52.0)
Hemoglobin: 10.3 g/dL — ABNORMAL LOW (ref 13.0–17.0)
Immature Granulocytes: 0 %
Lymphocytes Relative: 26 %
Lymphs Abs: 1.3 10*3/uL (ref 0.7–4.0)
MCH: 23.6 pg — ABNORMAL LOW (ref 26.0–34.0)
MCHC: 28.8 g/dL — ABNORMAL LOW (ref 30.0–36.0)
MCV: 81.9 fL (ref 80.0–100.0)
Monocytes Absolute: 0.4 10*3/uL (ref 0.1–1.0)
Monocytes Relative: 9 %
Neutro Abs: 2.9 10*3/uL (ref 1.7–7.7)
Neutrophils Relative %: 61 %
Platelet Count: 227 10*3/uL (ref 150–400)
RBC: 4.37 MIL/uL (ref 4.22–5.81)
RDW: 27.8 % — ABNORMAL HIGH (ref 11.5–15.5)
WBC Count: 4.8 10*3/uL (ref 4.0–10.5)
nRBC: 0 % (ref 0.0–0.2)

## 2021-05-14 LAB — FOLATE: Folate: 27.4 ng/mL (ref >5.9)

## 2021-05-14 LAB — CMP (CANCER CENTER ONLY)
ALT: 13 U/L (ref 0–44)
AST: 15 U/L (ref 15–41)
Albumin: 4.2 g/dL (ref 3.5–5.0)
Alkaline Phosphatase: 76 U/L (ref 38–126)
Anion gap: 6 (ref 5–15)
BUN: 15 mg/dL (ref 8–23)
CO2: 25 mmol/L (ref 22–32)
Calcium: 9.1 mg/dL (ref 8.9–10.3)
Chloride: 107 mmol/L (ref 98–111)
Creatinine: 0.89 mg/dL (ref 0.61–1.24)
GFR, Estimated: >60 mL/min (ref >60)
Glucose, Bld: 165 mg/dL — ABNORMAL HIGH (ref 70–99)
Potassium: 4.2 mmol/L (ref 3.5–5.1)
Sodium: 138 mmol/L (ref 135–145)
Total Bilirubin: 0.3 mg/dL (ref 0.3–1.2)
Total Protein: 7.5 g/dL (ref 6.5–8.1)

## 2021-05-14 LAB — IRON AND IRON BINDING CAPACITY (CC-WL,HP ONLY)
Iron: 122 ug/dL (ref 45–182)
Saturation Ratios: 24 % (ref 17.9–39.5)
TIBC: 517 ug/dL — ABNORMAL HIGH (ref 250–450)
UIBC: 395 ug/dL — ABNORMAL HIGH (ref 117–376)

## 2021-05-14 LAB — SAMPLE TO BLOOD BANK

## 2021-05-14 LAB — FERRITIN: Ferritin: 13 ng/mL — ABNORMAL LOW (ref 24–336)

## 2021-05-14 LAB — VITAMIN B12: Vitamin B-12: 313 pg/mL (ref 180–914)

## 2021-05-29 ENCOUNTER — Other Ambulatory Visit: Payer: Self-pay | Admitting: Endocrinology

## 2021-05-30 DIAGNOSIS — Z20822 Contact with and (suspected) exposure to covid-19: Secondary | ICD-10-CM | POA: Diagnosis not present

## 2021-06-02 ENCOUNTER — Ambulatory Visit (INDEPENDENT_AMBULATORY_CARE_PROVIDER_SITE_OTHER): Payer: Medicare Other

## 2021-06-02 DIAGNOSIS — I495 Sick sinus syndrome: Secondary | ICD-10-CM

## 2021-06-02 LAB — CUP PACEART REMOTE DEVICE CHECK
Battery Remaining Longevity: 115 mo
Battery Remaining Percentage: 95.5 %
Battery Voltage: 3.02 V
Brady Statistic AP VP Percent: 1 %
Brady Statistic AP VS Percent: 85 %
Brady Statistic AS VP Percent: 1 %
Brady Statistic AS VS Percent: 14 %
Brady Statistic RA Percent Paced: 84 %
Brady Statistic RV Percent Paced: 1 %
Date Time Interrogation Session: 20230509020014
Implantable Lead Implant Date: 20221108
Implantable Lead Implant Date: 20221108
Implantable Lead Location: 753859
Implantable Lead Location: 753860
Implantable Pulse Generator Implant Date: 20221108
Lead Channel Impedance Value: 460 Ohm
Lead Channel Impedance Value: 480 Ohm
Lead Channel Pacing Threshold Amplitude: 0.5 V
Lead Channel Pacing Threshold Amplitude: 1 V
Lead Channel Pacing Threshold Pulse Width: 0.5 ms
Lead Channel Pacing Threshold Pulse Width: 0.5 ms
Lead Channel Sensing Intrinsic Amplitude: 12 mV
Lead Channel Sensing Intrinsic Amplitude: 5 mV
Lead Channel Setting Pacing Amplitude: 2 V
Lead Channel Setting Pacing Amplitude: 2.5 V
Lead Channel Setting Pacing Pulse Width: 0.5 ms
Lead Channel Setting Sensing Sensitivity: 2 mV
Pulse Gen Model: 2272
Pulse Gen Serial Number: 3968031

## 2021-06-04 ENCOUNTER — Other Ambulatory Visit: Payer: Self-pay | Admitting: Internal Medicine

## 2021-06-04 DIAGNOSIS — I2510 Atherosclerotic heart disease of native coronary artery without angina pectoris: Secondary | ICD-10-CM

## 2021-06-04 DIAGNOSIS — H04123 Dry eye syndrome of bilateral lacrimal glands: Secondary | ICD-10-CM | POA: Diagnosis not present

## 2021-06-05 ENCOUNTER — Encounter: Payer: Self-pay | Admitting: Internal Medicine

## 2021-06-05 ENCOUNTER — Ambulatory Visit (INDEPENDENT_AMBULATORY_CARE_PROVIDER_SITE_OTHER): Payer: Medicare Other | Admitting: Internal Medicine

## 2021-06-05 VITALS — BP 138/82 | HR 60 | Ht 71.0 in | Wt 298.0 lb

## 2021-06-05 DIAGNOSIS — R001 Bradycardia, unspecified: Secondary | ICD-10-CM | POA: Diagnosis not present

## 2021-06-05 DIAGNOSIS — N529 Male erectile dysfunction, unspecified: Secondary | ICD-10-CM

## 2021-06-05 DIAGNOSIS — I495 Sick sinus syndrome: Secondary | ICD-10-CM

## 2021-06-05 DIAGNOSIS — I10 Essential (primary) hypertension: Secondary | ICD-10-CM | POA: Diagnosis not present

## 2021-06-05 DIAGNOSIS — I4819 Other persistent atrial fibrillation: Secondary | ICD-10-CM | POA: Diagnosis not present

## 2021-06-05 DIAGNOSIS — I2510 Atherosclerotic heart disease of native coronary artery without angina pectoris: Secondary | ICD-10-CM

## 2021-06-05 DIAGNOSIS — G4733 Obstructive sleep apnea (adult) (pediatric): Secondary | ICD-10-CM | POA: Diagnosis not present

## 2021-06-05 MED ORDER — METOPROLOL TARTRATE 25 MG PO TABS: 12.5000 mg | ORAL_TABLET | Freq: Two times a day (BID) | ORAL | 3 refills | Status: DC

## 2021-06-05 MED ORDER — TADALAFIL 10 MG PO TABS: 10.0000 mg | ORAL_TABLET | Freq: Every day | ORAL | 3 refills | Status: DC | PRN
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
## 2021-06-05 NOTE — Patient Instructions (Addendum)
Medication Instructions:  ?Your physician has recommended you make the following change in your medication:  ?Decrease metoprolol tartrate to 12.5 mg twice daily for 2 weeks, then change to 25 mg daily as needed.  ?Cialis 10 mg daily as needed ?Continue other medications the same ? ?Labwork: ?none ? ?Testing/Procedures: ?none ? ?Follow-Up: ?Your physician recommends that you schedule a follow-up appointment in: 6 months ? ?Any Other Special Instructions Will Be Listed Below (If Applicable). ? ?If you need a refill on your cardiac medications before your next appointment, please call your pharmacy ?

## 2021-06-09 ENCOUNTER — Telehealth: Payer: Self-pay | Admitting: Internal Medicine

## 2021-06-09 NOTE — Telephone Encounter (Signed)
Rescheduled 06/22 appointment to 06/27 per provider pal, patient has been called and notified. ?

## 2021-06-10 LAB — CUP PACEART INCLINIC DEVICE CHECK
Battery Remaining Longevity: 123 mo
Battery Voltage: 3.02 V
Brady Statistic RA Percent Paced: 85 %
Brady Statistic RV Percent Paced: 0.42 %
Date Time Interrogation Session: 20230512140400
Implantable Lead Implant Date: 20221108
Implantable Lead Implant Date: 20221108
Implantable Lead Location: 753859
Implantable Lead Location: 753860
Implantable Pulse Generator Implant Date: 20221108
Lead Channel Impedance Value: 450 Ohm
Lead Channel Impedance Value: 475 Ohm
Lead Channel Pacing Threshold Amplitude: 0.75 V
Lead Channel Pacing Threshold Amplitude: 0.75 V
Lead Channel Pacing Threshold Amplitude: 0.75 V
Lead Channel Pacing Threshold Amplitude: 0.75 V
Lead Channel Pacing Threshold Pulse Width: 0.5 ms
Lead Channel Pacing Threshold Pulse Width: 0.5 ms
Lead Channel Pacing Threshold Pulse Width: 0.5 ms
Lead Channel Pacing Threshold Pulse Width: 0.5 ms
Lead Channel Sensing Intrinsic Amplitude: 12 mV
Lead Channel Sensing Intrinsic Amplitude: 5 mV
Lead Channel Setting Pacing Amplitude: 2 V
Lead Channel Setting Pacing Amplitude: 2.5 V
Lead Channel Setting Pacing Pulse Width: 0.5 ms
Lead Channel Setting Sensing Sensitivity: 2 mV
Pulse Gen Model: 2272
Pulse Gen Serial Number: 3968031

## 2021-06-15 NOTE — Progress Notes (Signed)
Remote pacemaker transmission.   

## 2021-06-17 DIAGNOSIS — E1169 Type 2 diabetes mellitus with other specified complication: Secondary | ICD-10-CM | POA: Diagnosis not present

## 2021-06-17 DIAGNOSIS — E7800 Pure hypercholesterolemia, unspecified: Secondary | ICD-10-CM | POA: Diagnosis not present

## 2021-06-17 DIAGNOSIS — K625 Hemorrhage of anus and rectum: Secondary | ICD-10-CM | POA: Diagnosis not present

## 2021-06-17 DIAGNOSIS — R0602 Shortness of breath: Secondary | ICD-10-CM | POA: Diagnosis not present

## 2021-06-17 DIAGNOSIS — I4891 Unspecified atrial fibrillation: Secondary | ICD-10-CM | POA: Diagnosis not present

## 2021-06-17 DIAGNOSIS — R531 Weakness: Secondary | ICD-10-CM | POA: Diagnosis not present

## 2021-06-17 DIAGNOSIS — Z6841 Body Mass Index (BMI) 40.0 and over, adult: Secondary | ICD-10-CM | POA: Diagnosis not present

## 2021-06-17 DIAGNOSIS — Z1329 Encounter for screening for other suspected endocrine disorder: Secondary | ICD-10-CM | POA: Diagnosis not present

## 2021-06-17 DIAGNOSIS — E7801 Familial hypercholesterolemia: Secondary | ICD-10-CM | POA: Diagnosis not present

## 2021-06-18 ENCOUNTER — Telehealth: Payer: Self-pay | Admitting: Internal Medicine

## 2021-06-18 NOTE — Telephone Encounter (Signed)
Called patient regarding upcoming appointment, patient is notified. °

## 2021-06-20 ENCOUNTER — Other Ambulatory Visit (HOSPITAL_COMMUNITY): Payer: Self-pay | Admitting: Physician Assistant

## 2021-06-20 ENCOUNTER — Other Ambulatory Visit: Payer: Self-pay | Admitting: Endocrinology

## 2021-06-23 ENCOUNTER — Other Ambulatory Visit: Payer: Self-pay | Admitting: Internal Medicine

## 2021-06-23 ENCOUNTER — Other Ambulatory Visit: Payer: Self-pay

## 2021-06-23 DIAGNOSIS — E1165 Type 2 diabetes mellitus with hyperglycemia: Secondary | ICD-10-CM

## 2021-06-23 DIAGNOSIS — K219 Gastro-esophageal reflux disease without esophagitis: Secondary | ICD-10-CM

## 2021-06-23 DIAGNOSIS — K2970 Gastritis, unspecified, without bleeding: Secondary | ICD-10-CM

## 2021-06-23 MED ORDER — ONETOUCH DELICA LANCETS 33G MISC: 2 refills | Status: DC

## 2021-06-24 ENCOUNTER — Telehealth: Payer: Self-pay | Admitting: Internal Medicine

## 2021-06-24 DIAGNOSIS — I482 Chronic atrial fibrillation, unspecified: Secondary | ICD-10-CM | POA: Diagnosis not present

## 2021-06-24 DIAGNOSIS — Z794 Long term (current) use of insulin: Secondary | ICD-10-CM | POA: Diagnosis not present

## 2021-06-24 DIAGNOSIS — I10 Essential (primary) hypertension: Secondary | ICD-10-CM | POA: Diagnosis not present

## 2021-06-24 DIAGNOSIS — E1165 Type 2 diabetes mellitus with hyperglycemia: Secondary | ICD-10-CM | POA: Diagnosis not present

## 2021-06-24 NOTE — Telephone Encounter (Signed)
Called patient regarding upcoming June appointments, patient is notified.  

## 2021-06-25 ENCOUNTER — Encounter: Payer: Self-pay | Admitting: Cardiology

## 2021-06-25 ENCOUNTER — Ambulatory Visit (INDEPENDENT_AMBULATORY_CARE_PROVIDER_SITE_OTHER): Payer: Medicare Other | Admitting: Cardiology

## 2021-06-25 VITALS — BP 124/72 | HR 55 | Ht 71.0 in | Wt 303.8 lb

## 2021-06-25 DIAGNOSIS — I10 Essential (primary) hypertension: Secondary | ICD-10-CM | POA: Diagnosis not present

## 2021-06-25 DIAGNOSIS — I2510 Atherosclerotic heart disease of native coronary artery without angina pectoris: Secondary | ICD-10-CM | POA: Diagnosis not present

## 2021-06-25 DIAGNOSIS — G4733 Obstructive sleep apnea (adult) (pediatric): Secondary | ICD-10-CM
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
## 2021-06-25 NOTE — Patient Instructions (Signed)
Medication Instructions:  Your physician recommends that you continue on your current medications as directed. Please refer to the Current Medication list given to you today.  *If you need a refill on your cardiac medications before your next appointment, please call your pharmacy*  Follow-Up: At [facility], you and your health needs are our priority.  As part of our continuing mission to provide you with exceptional heart care, we have created designated Provider Care Teams.  These Care Teams include your primary Cardiologist (physician) and Advanced Practice Providers (APPs -  Physician Assistants and Nurse Practitioners) who all work together to provide you with the care you need, when you need it.  Your next appointment:   1 year(s)  The format for your next appointment:   In Person  Provider:   Traci Turner, MD  Important Information About Sugar       

## 2021-06-26 DIAGNOSIS — M6281 Muscle weakness (generalized): Secondary | ICD-10-CM | POA: Diagnosis not present

## 2021-06-26 DIAGNOSIS — M546 Pain in thoracic spine: Secondary | ICD-10-CM | POA: Diagnosis not present

## 2021-06-26 DIAGNOSIS — M25562 Pain in left knee: Secondary | ICD-10-CM | POA: Diagnosis not present

## 2021-06-26 DIAGNOSIS — M545 Low back pain, unspecified: Secondary | ICD-10-CM | POA: Diagnosis not present

## 2021-07-01 DIAGNOSIS — M6281 Muscle weakness (generalized): Secondary | ICD-10-CM | POA: Diagnosis not present

## 2021-07-01 DIAGNOSIS — M546 Pain in thoracic spine: Secondary | ICD-10-CM | POA: Diagnosis not present

## 2021-07-01 DIAGNOSIS — M25562 Pain in left knee: Secondary | ICD-10-CM | POA: Diagnosis not present

## 2021-07-01 DIAGNOSIS — M545 Low back pain, unspecified: Secondary | ICD-10-CM | POA: Diagnosis not present

## 2021-07-03 ENCOUNTER — Telehealth: Payer: Self-pay | Admitting: Internal Medicine

## 2021-07-03 DIAGNOSIS — M545 Low back pain, unspecified: Secondary | ICD-10-CM | POA: Diagnosis not present

## 2021-07-03 DIAGNOSIS — M546 Pain in thoracic spine: Secondary | ICD-10-CM | POA: Diagnosis not present

## 2021-07-03 DIAGNOSIS — M6281 Muscle weakness (generalized): Secondary | ICD-10-CM | POA: Diagnosis not present

## 2021-07-03 DIAGNOSIS — M25562 Pain in left knee: Secondary | ICD-10-CM | POA: Diagnosis not present

## 2021-07-03 NOTE — Telephone Encounter (Signed)
Called patient regarding rescheduled 06/27 appointment due to provider pal, patient has been called and notified. 

## 2021-07-06 ENCOUNTER — Telehealth: Payer: Self-pay

## 2021-07-06 DIAGNOSIS — M546 Pain in thoracic spine: Secondary | ICD-10-CM | POA: Diagnosis not present

## 2021-07-06 DIAGNOSIS — H25812 Combined forms of age-related cataract, left eye: Secondary | ICD-10-CM | POA: Diagnosis not present

## 2021-07-06 DIAGNOSIS — H401213 Low-tension glaucoma, right eye, severe stage: Secondary | ICD-10-CM | POA: Diagnosis not present

## 2021-07-06 DIAGNOSIS — M545 Low back pain, unspecified: Secondary | ICD-10-CM | POA: Diagnosis not present

## 2021-07-06 DIAGNOSIS — M6281 Muscle weakness (generalized): Secondary | ICD-10-CM | POA: Diagnosis not present

## 2021-07-06 DIAGNOSIS — H25811 Combined forms of age-related cataract, right eye: Secondary | ICD-10-CM | POA: Diagnosis not present

## 2021-07-06 DIAGNOSIS — M25562 Pain in left knee: Secondary | ICD-10-CM | POA: Diagnosis not present

## 2021-07-06 DIAGNOSIS — H401222 Low-tension glaucoma, left eye, moderate stage: Secondary | ICD-10-CM | POA: Diagnosis not present

## 2021-07-06 NOTE — Telephone Encounter (Signed)
Patient called in states that he wants to go back to '1mg'$  ozempic. Says pharmacy doesn't have '2mg'$  offer to send somewhere else, but he stated '2mg'$  doesn't work and wants to go back to '1mg'$ . Is ok to send to [facility]?

## 2021-07-07 ENCOUNTER — Other Ambulatory Visit: Payer: Self-pay

## 2021-07-07 DIAGNOSIS — E1165 Type 2 diabetes mellitus with hyperglycemia: Secondary | ICD-10-CM

## 2021-07-07 MED ORDER — OZEMPIC (2 MG/DOSE) 8 MG/3ML ~~LOC~~ SOPN: PEN_INJECTOR | SUBCUTANEOUS | 2 refills | Status: DC

## 2021-07-08 DIAGNOSIS — R0602 Shortness of breath: Secondary | ICD-10-CM | POA: Diagnosis not present

## 2021-07-08 DIAGNOSIS — E7801 Familial hypercholesterolemia: Secondary | ICD-10-CM | POA: Diagnosis not present

## 2021-07-08 DIAGNOSIS — I4891 Unspecified atrial fibrillation: Secondary | ICD-10-CM | POA: Diagnosis not present

## 2021-07-08 DIAGNOSIS — R030 Elevated blood-pressure reading, without diagnosis of hypertension: Secondary | ICD-10-CM | POA: Diagnosis not present

## 2021-07-08 DIAGNOSIS — M546 Pain in thoracic spine: Secondary | ICD-10-CM | POA: Diagnosis not present

## 2021-07-08 DIAGNOSIS — M25562 Pain in left knee: Secondary | ICD-10-CM | POA: Diagnosis not present

## 2021-07-08 DIAGNOSIS — R531 Weakness: Secondary | ICD-10-CM | POA: Diagnosis not present

## 2021-07-08 DIAGNOSIS — Z1329 Encounter for screening for other suspected endocrine disorder: Secondary | ICD-10-CM | POA: Diagnosis not present

## 2021-07-08 DIAGNOSIS — D485 Neoplasm of uncertain behavior of skin: Secondary | ICD-10-CM | POA: Diagnosis not present

## 2021-07-08 DIAGNOSIS — E1169 Type 2 diabetes mellitus with other specified complication: Secondary | ICD-10-CM | POA: Diagnosis not present

## 2021-07-08 DIAGNOSIS — M545 Low back pain, unspecified: Secondary | ICD-10-CM | POA: Diagnosis not present

## 2021-07-08 DIAGNOSIS — Z6841 Body Mass Index (BMI) 40.0 and over, adult: Secondary | ICD-10-CM | POA: Diagnosis not present

## 2021-07-08 DIAGNOSIS — Z1389 Encounter for screening for other disorder: Secondary | ICD-10-CM | POA: Diagnosis not present

## 2021-07-08 DIAGNOSIS — Z1331 Encounter for screening for depression: Secondary | ICD-10-CM | POA: Diagnosis not present

## 2021-07-08 DIAGNOSIS — C44319 Basal cell carcinoma of skin of other parts of face: Secondary | ICD-10-CM | POA: Diagnosis not present

## 2021-07-08 DIAGNOSIS — M6281 Muscle weakness (generalized): Secondary | ICD-10-CM | POA: Diagnosis not present

## 2021-07-16 ENCOUNTER — Other Ambulatory Visit: Payer: Medicare Other

## 2021-07-16 ENCOUNTER — Ambulatory Visit: Payer: Medicare Other | Admitting: Internal Medicine

## 2021-07-20 DIAGNOSIS — C44319 Basal cell carcinoma of skin of other parts of face: Secondary | ICD-10-CM | POA: Diagnosis not present

## 2021-07-21 ENCOUNTER — Ambulatory Visit: Payer: Medicare Other | Admitting: Internal Medicine

## 2021-07-21 ENCOUNTER — Other Ambulatory Visit: Payer: Medicare Other

## 2021-07-21 DIAGNOSIS — R109 Unspecified abdominal pain: Secondary | ICD-10-CM | POA: Diagnosis not present

## 2021-07-22 ENCOUNTER — Inpatient Hospital Stay: Payer: Medicare Other | Attending: Physician Assistant

## 2021-07-22 ENCOUNTER — Inpatient Hospital Stay (HOSPITAL_BASED_OUTPATIENT_CLINIC_OR_DEPARTMENT_OTHER): Payer: Medicare Other | Admitting: Internal Medicine

## 2021-07-22 ENCOUNTER — Other Ambulatory Visit: Payer: Self-pay

## 2021-07-22 ENCOUNTER — Other Ambulatory Visit: Payer: Self-pay | Admitting: Gastroenterology

## 2021-07-22 ENCOUNTER — Ambulatory Visit: Payer: Medicare Other | Admitting: Internal Medicine

## 2021-07-22 ENCOUNTER — Other Ambulatory Visit: Payer: Medicare Other

## 2021-07-22 VITALS — BP 144/82 | HR 67 | Resp 18 | Ht 71.0 in | Wt 306.8 lb

## 2021-07-22 DIAGNOSIS — K922 Gastrointestinal hemorrhage, unspecified: Secondary | ICD-10-CM | POA: Diagnosis not present

## 2021-07-22 DIAGNOSIS — D500 Iron deficiency anemia secondary to blood loss (chronic): Secondary | ICD-10-CM | POA: Diagnosis not present

## 2021-07-22 DIAGNOSIS — Z79899 Other long term (current) drug therapy: Secondary | ICD-10-CM | POA: Insufficient documentation

## 2021-07-22 DIAGNOSIS — K219 Gastro-esophageal reflux disease without esophagitis: Secondary | ICD-10-CM

## 2021-07-22 DIAGNOSIS — K2970 Gastritis, unspecified, without bleeding: Secondary | ICD-10-CM

## 2021-07-22 LAB — CBC WITH DIFFERENTIAL (CANCER CENTER ONLY)
Abs Immature Granulocytes: 0.01 10*3/uL (ref 0.00–0.07)
Basophils Absolute: 0.1 10*3/uL (ref 0.0–0.1)
Basophils Relative: 1 %
Eosinophils Absolute: 0.2 10*3/uL (ref 0.0–0.5)
Eosinophils Relative: 3 %
HCT: 45.4 % (ref 39.0–52.0)
Hemoglobin: 15.2 g/dL (ref 13.0–17.0)
Immature Granulocytes: 0 %
Lymphocytes Relative: 28 %
Lymphs Abs: 1.7 10*3/uL (ref 0.7–4.0)
MCH: 29.5 pg (ref 26.0–34.0)
MCHC: 33.5 g/dL (ref 30.0–36.0)
MCV: 88.2 fL (ref 80.0–100.0)
Monocytes Absolute: 0.4 10*3/uL (ref 0.1–1.0)
Monocytes Relative: 7 %
Neutro Abs: 3.7 10*3/uL (ref 1.7–7.7)
Neutrophils Relative %: 61 %
Platelet Count: 160 10*3/uL (ref 150–400)
RBC: 5.15 MIL/uL (ref 4.22–5.81)
RDW: 17 % — ABNORMAL HIGH (ref 11.5–15.5)
WBC Count: 6.1 10*3/uL (ref 4.0–10.5)
nRBC: 0 % (ref 0.0–0.2)

## 2021-07-22 LAB — IRON AND IRON BINDING CAPACITY (CC-WL,HP ONLY)
Iron: 54 ug/dL (ref 45–182)
Saturation Ratios: 12 % — ABNORMAL LOW (ref 17.9–39.5)
TIBC: 449 ug/dL (ref 250–450)
UIBC: 395 ug/dL — ABNORMAL HIGH (ref 117–376)

## 2021-07-22 LAB — SAMPLE TO BLOOD BANK

## 2021-07-22 LAB — FERRITIN: Ferritin: 12 ng/mL — ABNORMAL LOW (ref 24–336)
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
## 2021-07-23 ENCOUNTER — Telehealth: Payer: Self-pay

## 2021-07-23 NOTE — Telephone Encounter (Signed)
Alert remote reviewed. Normal device function.   Known PAF, on [place], There were three HVRs detected, on lasted one hour and 12 minutes.  Sent to triage for AF with RVR.  Metoprolol was discontinued due to fatigue according to previous reports. Next remote 09/01/2021.   Unsuccessful telephone encounter to patient to assess for s/s AF with RVR. [employer] compliant VM message left requesting call back to [phone].

## 2021-07-23 NOTE — Telephone Encounter (Signed)
Patient returning call. Reviewed RVR episode with patient. States he was extremely symptomatic during episode c/o shortness of breath, dizziness when he attempted to stand, and a feeling as though he may pass out. Patient also states since discontinuation of the BB (06/05/21), his energy level has somewhat improved but unsure if it is related or if the return of his Hgb to 15 has cause his energy level to return. Previously 6.9 four months ago which required blood transfusions. ED precautions for tachycardia provided given symptoms. Routing to Dr. [name] for advisement.

## 2021-07-24 DIAGNOSIS — I10 Essential (primary) hypertension: Secondary | ICD-10-CM | POA: Diagnosis not present

## 2021-07-24 DIAGNOSIS — I482 Chronic atrial fibrillation, unspecified: Secondary | ICD-10-CM | POA: Diagnosis not present

## 2021-07-24 DIAGNOSIS — Z794 Long term (current) use of insulin: Secondary | ICD-10-CM | POA: Diagnosis not present

## 2021-07-24 DIAGNOSIS — E1165 Type 2 diabetes mellitus with hyperglycemia: Secondary | ICD-10-CM | POA: Diagnosis not present

## 2021-07-29 ENCOUNTER — Encounter: Payer: Self-pay | Admitting: Gastroenterology

## 2021-07-29 ENCOUNTER — Ambulatory Visit (INDEPENDENT_AMBULATORY_CARE_PROVIDER_SITE_OTHER): Payer: Medicare Other | Admitting: Gastroenterology

## 2021-07-29 VITALS — BP 134/80 | Temp 98.5°F | Ht 71.0 in | Wt 306.3 lb

## 2021-07-29 DIAGNOSIS — K2270 Barrett's esophagus without dysplasia: Secondary | ICD-10-CM

## 2021-07-29 DIAGNOSIS — D500 Iron deficiency anemia secondary to blood loss (chronic): Secondary | ICD-10-CM

## 2021-07-29 DIAGNOSIS — I2510 Atherosclerotic heart disease of native coronary artery without angina pectoris: Secondary | ICD-10-CM | POA: Diagnosis not present

## 2021-07-29 NOTE — Patient Instructions (Signed)
Continue taking pantoprazole daily, 30 minutes before breakfast.  We will see you in 1 year!  Your next endoscopy will be in 2026!  I enjoyed seeing you again today! As you know, I value our relationship and want to provide genuine, compassionate, and quality care. I welcome your feedback. If you receive a survey regarding your visit,  I greatly appreciate you taking time to fill this out. See you next time!  Annitta Needs, PhD, ANP-BC [facility]
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
## 2021-07-30 ENCOUNTER — Other Ambulatory Visit: Payer: Self-pay

## 2021-07-30 DIAGNOSIS — E1165 Type 2 diabetes mellitus with hyperglycemia: Secondary | ICD-10-CM

## 2021-07-30 MED ORDER — ONETOUCH VERIO VI STRP: ORAL_STRIP | 3 refills | Status: DC

## 2021-07-31 ENCOUNTER — Other Ambulatory Visit: Payer: Self-pay

## 2021-07-31 DIAGNOSIS — E1165 Type 2 diabetes mellitus with hyperglycemia: Secondary | ICD-10-CM

## 2021-07-31 MED ORDER — ONETOUCH VERIO W/DEVICE KIT: PACK | 0 refills | Status: AC

## 2021-08-02 NOTE — Telephone Encounter (Signed)
Appears that he is back in sinus.  Continue to monitor with no changes at this time.

## 2021-08-03 ENCOUNTER — Ambulatory Visit (INDEPENDENT_AMBULATORY_CARE_PROVIDER_SITE_OTHER): Payer: Medicare Other | Admitting: Orthopedic Surgery

## 2021-08-03 ENCOUNTER — Ambulatory Visit (INDEPENDENT_AMBULATORY_CARE_PROVIDER_SITE_OTHER): Payer: Medicare Other

## 2021-08-03 DIAGNOSIS — H401213 Low-tension glaucoma, right eye, severe stage: Secondary | ICD-10-CM | POA: Diagnosis not present

## 2021-08-03 DIAGNOSIS — M25462 Effusion, left knee: Secondary | ICD-10-CM

## 2021-08-03 DIAGNOSIS — I2510 Atherosclerotic heart disease of native coronary artery without angina pectoris: Secondary | ICD-10-CM

## 2021-08-03 DIAGNOSIS — M25562 Pain in left knee: Secondary | ICD-10-CM | POA: Diagnosis not present

## 2021-08-03 DIAGNOSIS — M1712 Unilateral primary osteoarthritis, left knee: Secondary | ICD-10-CM | POA: Diagnosis not present

## 2021-08-03 DIAGNOSIS — H25812 Combined forms of age-related cataract, left eye: Secondary | ICD-10-CM | POA: Diagnosis not present

## 2021-08-03 DIAGNOSIS — H25811 Combined forms of age-related cataract, right eye: Secondary | ICD-10-CM | POA: Diagnosis not present

## 2021-08-03 DIAGNOSIS — G8929 Other chronic pain: Secondary | ICD-10-CM

## 2021-08-03 DIAGNOSIS — H401222 Low-tension glaucoma, left eye, moderate stage: Secondary | ICD-10-CM | POA: Diagnosis not present

## 2021-08-03 NOTE — Progress Notes (Signed)
Chief Complaint  Patient presents with   Knee Pain    L/ Pain is about a 7 on pain scale to day.   Meds related tylenol and eliquis  Lawrence Jordan fell 2 weeks ago he is on Eliquis as stated.  He had pain and swelling his left knee still has 7 out of 10 pain his right knee started to hurt as well.  He also has had a hip replacement so he is concerned about that a little bit.  He is walking  His left knee does have an effusion his flexion limit is about 105 degrees full extension does not appear to be unstable.  I I did advise aspiration he agreed we aspirated 35 cc of clear yellow fluid and were going to get an x-ray of his knee  Examined his hip his hip range of motion was normal pain-free and there were no leg length issues.  Procedure note injection and aspiration left knee joint  Verbal consent was obtained to aspirate and inject the left knee joint   Timeout was completed to confirm the site of aspiration and injection  An 18-gauge needle was used to aspirate the left knee joint from a suprapatellar lateral approach.  The medications used were 40 mg of Depo-Medrol and 1% lidocaine 3 cc  Anesthesia was provided by ethyl chloride and the skin was prepped with alcohol.  After cleaning the skin with alcohol an 18-gauge needle was used to aspirate the right knee joint.  We obtained 35 cc of fluid clear yellow  We followed this by injection of 40 mg of Depo-Medrol and 3 cc 1% lidocaine.  There were no complications. A sterile bandage was applied.    X-rays left knee: severe OA left knee grade 4    Encounter Diagnoses  Name Primary?   Chronic pain of left knee Yes   Acute pain of left knee    Effusion of knee joint, left     Recommend general activity modification  Ice for swelling  Tylenol for pain  Return if no improvement after 3 weeks

## 2021-08-03 NOTE — Patient Instructions (Signed)
Recommend ice if you see swelling 30 minutes at a time 3 times a day  Use Tylenol 500 mg every 6 for pain as needed  Activity modification for the next 2 to 3 weeks try to decrease the activity avoid things that cause the knee to ache or have pain  If no improvement after 3 weeks then come back for reevaluation

## 2021-08-24 DIAGNOSIS — E1165 Type 2 diabetes mellitus with hyperglycemia: Secondary | ICD-10-CM | POA: Diagnosis not present

## 2021-08-24 DIAGNOSIS — I10 Essential (primary) hypertension: Secondary | ICD-10-CM | POA: Diagnosis not present

## 2021-08-24 DIAGNOSIS — Z794 Long term (current) use of insulin: Secondary | ICD-10-CM | POA: Diagnosis not present

## 2021-08-24 DIAGNOSIS — I482 Chronic atrial fibrillation, unspecified: Secondary | ICD-10-CM | POA: Diagnosis not present

## 2021-08-29 ENCOUNTER — Other Ambulatory Visit: Payer: Self-pay | Admitting: Physician Assistant

## 2021-08-29 DIAGNOSIS — I4819 Other persistent atrial fibrillation: Secondary | ICD-10-CM

## 2021-08-31 ENCOUNTER — Other Ambulatory Visit (INDEPENDENT_AMBULATORY_CARE_PROVIDER_SITE_OTHER): Payer: Medicare Other

## 2021-08-31 ENCOUNTER — Other Ambulatory Visit: Payer: Self-pay | Admitting: Endocrinology

## 2021-08-31 DIAGNOSIS — E669 Obesity, unspecified: Secondary | ICD-10-CM

## 2021-08-31 DIAGNOSIS — E1169 Type 2 diabetes mellitus with other specified complication: Secondary | ICD-10-CM | POA: Diagnosis not present

## 2021-08-31 NOTE — Telephone Encounter (Signed)
Prescription refill request for Eliquis received. Indication:  Last office visit: 06/25/21 [place] [facility])  Scr: 0.89 (05/14/21)  Age: 70 Weight: 138.9kg  Appropriate dose and refill sent to requested pharmacy.

## 2021-09-01 ENCOUNTER — Ambulatory Visit (INDEPENDENT_AMBULATORY_CARE_PROVIDER_SITE_OTHER): Payer: Medicare Other

## 2021-09-01 DIAGNOSIS — I495 Sick sinus syndrome: Secondary | ICD-10-CM

## 2021-09-01 DIAGNOSIS — E1165 Type 2 diabetes mellitus with hyperglycemia: Secondary | ICD-10-CM | POA: Diagnosis not present

## 2021-09-01 DIAGNOSIS — I10 Essential (primary) hypertension: Secondary | ICD-10-CM | POA: Diagnosis not present

## 2021-09-01 DIAGNOSIS — E7801 Familial hypercholesterolemia: Secondary | ICD-10-CM | POA: Diagnosis not present

## 2021-09-01 LAB — CUP PACEART REMOTE DEVICE CHECK
Battery Remaining Longevity: 109 mo
Battery Remaining Percentage: 95 %
Battery Voltage: 3.02 V
Brady Statistic AP VP Percent: 1 %
Brady Statistic AP VS Percent: 82 %
Brady Statistic AS VP Percent: 1 %
Brady Statistic AS VS Percent: 17 %
Brady Statistic RA Percent Paced: 77 %
Brady Statistic RV Percent Paced: 1 %
Date Time Interrogation Session: 20230808020018
Implantable Lead Implant Date: 20221108
Implantable Lead Implant Date: 20221108
Implantable Lead Location: 753859
Implantable Lead Location: 753860
Implantable Pulse Generator Implant Date: 20221108
Lead Channel Impedance Value: 480 Ohm
Lead Channel Impedance Value: 480 Ohm
Lead Channel Pacing Threshold Amplitude: 0.75 V
Lead Channel Pacing Threshold Amplitude: 0.75 V
Lead Channel Pacing Threshold Pulse Width: 0.5 ms
Lead Channel Pacing Threshold Pulse Width: 0.5 ms
Lead Channel Sensing Intrinsic Amplitude: 12 mV
Lead Channel Sensing Intrinsic Amplitude: 5 mV
Lead Channel Setting Pacing Amplitude: 2 V
Lead Channel Setting Pacing Amplitude: 2.5 V
Lead Channel Setting Pacing Pulse Width: 0.5 ms
Lead Channel Setting Sensing Sensitivity: 2 mV
Pulse Gen Model: 2272
Pulse Gen Serial Number: 3968031

## 2021-09-01 LAB — BASIC METABOLIC PANEL
BUN: 13 mg/dL (ref 6–23)
CO2: 26 mEq/L (ref 19–32)
Calcium: 9.7 mg/dL (ref 8.4–10.5)
Chloride: 104 mEq/L (ref 96–112)
Creatinine, Ser: 0.76 mg/dL (ref 0.40–1.50)
GFR: 90.93 mL/min (ref >60.00)
Glucose, Bld: 118 mg/dL — ABNORMAL HIGH (ref 70–99)
Potassium: 4.2 mEq/L (ref 3.5–5.1)
Sodium: 140 mEq/L (ref 135–145)

## 2021-09-01 LAB — HEMOGLOBIN A1C: Hgb A1c MFr Bld: 7.4 % — ABNORMAL HIGH (ref 4.6–6.5)

## 2021-09-04 ENCOUNTER — Encounter: Payer: Self-pay | Admitting: Endocrinology

## 2021-09-04 ENCOUNTER — Ambulatory Visit (INDEPENDENT_AMBULATORY_CARE_PROVIDER_SITE_OTHER): Payer: Medicare Other | Admitting: Endocrinology

## 2021-09-04 VITALS — BP 132/82 | HR 57 | Ht 71.0 in | Wt 312.0 lb

## 2021-09-04 DIAGNOSIS — Z794 Long term (current) use of insulin: Secondary | ICD-10-CM | POA: Diagnosis not present

## 2021-09-04 DIAGNOSIS — E1165 Type 2 diabetes mellitus with hyperglycemia: Secondary | ICD-10-CM

## 2021-09-04 DIAGNOSIS — I2510 Atherosclerotic heart disease of native coronary artery without angina pectoris: Secondary | ICD-10-CM

## 2021-09-04 DIAGNOSIS — I10 Essential (primary) hypertension: Secondary | ICD-10-CM | POA: Diagnosis not present

## 2021-09-04 MED ORDER — TIRZEPATIDE 5 MG/0.5ML ~~LOC~~ SOAJ: 5.0000 mg | SUBCUTANEOUS | 0 refills | Status: DC

## 2021-09-04 NOTE — Patient Instructions (Addendum)
[place]
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
## 2021-09-08 ENCOUNTER — Encounter: Payer: Self-pay | Admitting: Internal Medicine

## 2021-09-08 DIAGNOSIS — R531 Weakness: Secondary | ICD-10-CM | POA: Diagnosis not present

## 2021-09-08 DIAGNOSIS — R0602 Shortness of breath: Secondary | ICD-10-CM | POA: Diagnosis not present

## 2021-09-08 DIAGNOSIS — N400 Enlarged prostate without lower urinary tract symptoms: Secondary | ICD-10-CM | POA: Diagnosis not present

## 2021-09-08 DIAGNOSIS — E7849 Other hyperlipidemia: Secondary | ICD-10-CM | POA: Diagnosis not present

## 2021-09-08 DIAGNOSIS — K625 Hemorrhage of anus and rectum: Secondary | ICD-10-CM

## 2021-09-08 DIAGNOSIS — Z6841 Body Mass Index (BMI) 40.0 and over, adult: Secondary | ICD-10-CM | POA: Diagnosis not present

## 2021-09-08 DIAGNOSIS — I4891 Unspecified atrial fibrillation: Secondary | ICD-10-CM | POA: Diagnosis not present

## 2021-09-08 DIAGNOSIS — K922 Gastrointestinal hemorrhage, unspecified: Secondary | ICD-10-CM | POA: Diagnosis not present

## 2021-09-08 DIAGNOSIS — Z1329 Encounter for screening for other suspected endocrine disorder: Secondary | ICD-10-CM | POA: Diagnosis not present

## 2021-09-08 DIAGNOSIS — E1169 Type 2 diabetes mellitus with other specified complication: Secondary | ICD-10-CM | POA: Diagnosis not present

## 2021-09-10 ENCOUNTER — Other Ambulatory Visit: Payer: Self-pay | Admitting: *Deleted

## 2021-09-10 DIAGNOSIS — K625 Hemorrhage of anus and rectum: Secondary | ICD-10-CM

## 2021-09-10 NOTE — Telephone Encounter (Signed)
Tammy and Courtney:  Can one of you please release the CBC for [facility] and send over? THanks so much!

## 2021-09-10 NOTE — Telephone Encounter (Signed)
Labs were released and faxed to [facility]

## 2021-09-11 ENCOUNTER — Telehealth: Payer: Self-pay

## 2021-09-11 NOTE — Telephone Encounter (Signed)
View All Conversations on this Encounter Lawrence Grayer, MD  You 2 hours ago (2:48 PM)   I feel like with active bleeding that he should stop his eliquis until he has been evaluated by his gastroenterologist.  Arloa Koh have him follow-up in the AF clinic in 2 weeks for further discussion.     Called Pt per Dr. [name] message above.  Pt advised to discontinue taking the Eliquis until he gets into see his gastroenterologist.  Pt stated that gastroenterology advised to stop eliquis, and monitor over weekend. I advised patient to go to ER if continued / worsening GI bleeding over the weekend to see a provider.    Pt also advised to f/u with A-FIB clinic.  I will reach out to them, to establish a 2 week follow up appointment.   Pt understood not to take eliquis, monitor bleeding, Go to ER if necessary, and f/u with A-Fib in 2 weeks.  No f/u required at this time.

## 2021-09-15 DIAGNOSIS — K922 Gastrointestinal hemorrhage, unspecified: Secondary | ICD-10-CM | POA: Diagnosis not present

## 2021-09-19 ENCOUNTER — Other Ambulatory Visit: Payer: Self-pay | Admitting: Endocrinology

## 2021-09-21 DIAGNOSIS — L570 Actinic keratosis: Secondary | ICD-10-CM | POA: Diagnosis not present

## 2021-09-21 DIAGNOSIS — L905 Scar conditions and fibrosis of skin: Secondary | ICD-10-CM | POA: Diagnosis not present

## 2021-09-25 ENCOUNTER — Other Ambulatory Visit: Payer: Self-pay

## 2021-09-25 DIAGNOSIS — E1165 Type 2 diabetes mellitus with hyperglycemia: Secondary | ICD-10-CM

## 2021-09-25 MED ORDER — TIRZEPATIDE 5 MG/0.5ML ~~LOC~~ SOAJ: 5.0000 mg | SUBCUTANEOUS | 0 refills | Status: DC

## 2021-09-30 ENCOUNTER — Encounter (HOSPITAL_COMMUNITY): Payer: Self-pay | Admitting: Nurse Practitioner

## 2021-09-30 ENCOUNTER — Ambulatory Visit (HOSPITAL_COMMUNITY)
Admission: RE | Admit: 2021-09-30 | Discharge: 2021-09-30 | Disposition: A | Discharge status: Home / Self Care | Payer: Medicare Other | Source: Ambulatory Visit | Attending: Nurse Practitioner | Admitting: Nurse Practitioner

## 2021-09-30 ENCOUNTER — Other Ambulatory Visit (HOSPITAL_COMMUNITY): Payer: Self-pay

## 2021-09-30 VITALS — BP 138/80 | HR 58 | Ht 71.0 in | Wt 321.6 lb

## 2021-09-30 DIAGNOSIS — D6869 Other thrombophilia: Secondary | ICD-10-CM

## 2021-09-30 DIAGNOSIS — R001 Bradycardia, unspecified: Secondary | ICD-10-CM | POA: Diagnosis not present

## 2021-09-30 DIAGNOSIS — I4892 Unspecified atrial flutter: Secondary | ICD-10-CM | POA: Diagnosis not present

## 2021-09-30 DIAGNOSIS — Z950 Presence of cardiac pacemaker: Secondary | ICD-10-CM | POA: Insufficient documentation

## 2021-09-30 DIAGNOSIS — Z8616 Personal history of COVID-19: Secondary | ICD-10-CM | POA: Insufficient documentation

## 2021-09-30 DIAGNOSIS — Z79899 Other long term (current) drug therapy: Secondary | ICD-10-CM | POA: Insufficient documentation

## 2021-09-30 DIAGNOSIS — I4819 Other persistent atrial fibrillation: Secondary | ICD-10-CM

## 2021-09-30 DIAGNOSIS — I5032 Chronic diastolic (congestive) heart failure: Secondary | ICD-10-CM | POA: Insufficient documentation

## 2021-09-30 DIAGNOSIS — K2970 Gastritis, unspecified, without bleeding: Secondary | ICD-10-CM | POA: Diagnosis not present

## 2021-09-30 DIAGNOSIS — K219 Gastro-esophageal reflux disease without esophagitis: Secondary | ICD-10-CM

## 2021-09-30 DIAGNOSIS — I110 Hypertensive heart disease with heart failure: Secondary | ICD-10-CM | POA: Diagnosis not present

## 2021-09-30 DIAGNOSIS — G4733 Obstructive sleep apnea (adult) (pediatric): Secondary | ICD-10-CM | POA: Insufficient documentation

## 2021-09-30 MED ORDER — PANTOPRAZOLE SODIUM 40 MG PO TBEC: DELAYED_RELEASE_TABLET | ORAL | Status: DC

## 2021-09-30 MED ORDER — METOPROLOL TARTRATE 25 MG PO TABS: 25.0000 mg | ORAL_TABLET | Freq: Every morning | ORAL | Status: DC

## 2021-09-30 MED ORDER — POTASSIUM CHLORIDE CRYS ER 20 MEQ PO TBCR: 20.0000 meq | EXTENDED_RELEASE_TABLET | Freq: Every day | ORAL | Status: DC
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
## 2021-10-01 ENCOUNTER — Other Ambulatory Visit (HOSPITAL_COMMUNITY): Payer: Self-pay

## 2021-10-01 ENCOUNTER — Telehealth: Payer: Self-pay | Admitting: Pharmacy Technician

## 2021-10-01 DIAGNOSIS — E1165 Type 2 diabetes mellitus with hyperglycemia: Secondary | ICD-10-CM

## 2021-10-01 NOTE — Telephone Encounter (Signed)
Patient Advocate Encounter  Prior Authorization for Darcel Bayley has been approved.   Effective: 01/25/2021 to 10/01/2022  Clista Bernhardt, CPhT Rx Patient Advocate Phone: [phone]

## 2021-10-01 NOTE — Telephone Encounter (Signed)
Patient Advocate Encounter   Received notification that prior authorization for Mounjaro '5mg'$  is required/requested.   PA submitted on 10/01/21 to [place] via CoverMyMeds Key [id number] - PA Case ID: [id number] Status is pending

## 2021-10-05 MED ORDER — TIRZEPATIDE 10 MG/0.5ML ~~LOC~~ SOAJ: 10.0000 mg | SUBCUTANEOUS | 0 refills | Status: DC

## 2021-10-05 NOTE — Addendum Note (Signed)
Addended by: Cinda Quest on: 10/05/2021 01:00 PM   Modules accepted: Orders

## 2021-10-06 NOTE — Progress Notes (Signed)
Remote pacemaker transmission.   

## 2021-10-14 ENCOUNTER — Telehealth: Payer: Self-pay | Admitting: Gastroenterology

## 2021-10-14 DIAGNOSIS — K922 Gastrointestinal hemorrhage, unspecified: Secondary | ICD-10-CM

## 2021-10-14 NOTE — Telephone Encounter (Signed)
Pt left a message stating he wanted to see Dr. Abbey Chatters or Vicente Males regarding continued bleeding issue.   Please advise if he should be scheduled for an appointment.

## 2021-10-14 NOTE — Telephone Encounter (Signed)
Pt sent a MyChart message earlier today and he has called regarding a appt with you or Dr. Abbey Chatters. Please advise

## 2021-10-15 ENCOUNTER — Other Ambulatory Visit: Payer: Self-pay

## 2021-10-15 DIAGNOSIS — Z85828 Personal history of other malignant neoplasm of skin: Secondary | ICD-10-CM

## 2021-10-15 DIAGNOSIS — E1165 Type 2 diabetes mellitus with hyperglycemia: Secondary | ICD-10-CM | POA: Diagnosis present

## 2021-10-15 DIAGNOSIS — Z91048 Other nonmedicinal substance allergy status: Secondary | ICD-10-CM

## 2021-10-15 DIAGNOSIS — Z79899 Other long term (current) drug therapy: Secondary | ICD-10-CM

## 2021-10-15 DIAGNOSIS — K922 Gastrointestinal hemorrhage, unspecified: Secondary | ICD-10-CM

## 2021-10-15 DIAGNOSIS — Z882 Allergy status to sulfonamides status: Secondary | ICD-10-CM

## 2021-10-15 DIAGNOSIS — Z950 Presence of cardiac pacemaker: Secondary | ICD-10-CM

## 2021-10-15 DIAGNOSIS — Z91018 Allergy to other foods: Secondary | ICD-10-CM

## 2021-10-15 DIAGNOSIS — G4733 Obstructive sleep apnea (adult) (pediatric): Secondary | ICD-10-CM | POA: Diagnosis present

## 2021-10-15 DIAGNOSIS — D126 Benign neoplasm of colon, unspecified: Secondary | ICD-10-CM | POA: Diagnosis not present

## 2021-10-15 DIAGNOSIS — Z881 Allergy status to other antibiotic agents status: Secondary | ICD-10-CM

## 2021-10-15 DIAGNOSIS — M1990 Unspecified osteoarthritis, unspecified site: Secondary | ICD-10-CM | POA: Diagnosis present

## 2021-10-15 DIAGNOSIS — Z8261 Family history of arthritis: Secondary | ICD-10-CM | POA: Diagnosis not present

## 2021-10-15 DIAGNOSIS — D649 Anemia, unspecified: Secondary | ICD-10-CM | POA: Diagnosis not present

## 2021-10-15 DIAGNOSIS — Z6841 Body Mass Index (BMI) 40.0 and over, adult: Secondary | ICD-10-CM | POA: Diagnosis not present

## 2021-10-15 DIAGNOSIS — H409 Unspecified glaucoma: Secondary | ICD-10-CM | POA: Diagnosis present

## 2021-10-15 DIAGNOSIS — Z713 Dietary counseling and surveillance: Secondary | ICD-10-CM

## 2021-10-15 DIAGNOSIS — K219 Gastro-esophageal reflux disease without esophagitis: Secondary | ICD-10-CM | POA: Diagnosis present

## 2021-10-15 DIAGNOSIS — D62 Acute posthemorrhagic anemia: Secondary | ICD-10-CM | POA: Diagnosis present

## 2021-10-15 DIAGNOSIS — Z23 Encounter for immunization: Secondary | ICD-10-CM

## 2021-10-15 DIAGNOSIS — I110 Hypertensive heart disease with heart failure: Secondary | ICD-10-CM | POA: Diagnosis present

## 2021-10-15 DIAGNOSIS — K625 Hemorrhage of anus and rectum: Secondary | ICD-10-CM | POA: Diagnosis not present

## 2021-10-15 DIAGNOSIS — Z7901 Long term (current) use of anticoagulants: Secondary | ICD-10-CM

## 2021-10-15 DIAGNOSIS — I5023 Acute on chronic systolic (congestive) heart failure: Secondary | ICD-10-CM | POA: Diagnosis present

## 2021-10-15 DIAGNOSIS — I10 Essential (primary) hypertension: Secondary | ICD-10-CM | POA: Diagnosis not present

## 2021-10-15 DIAGNOSIS — K643 Fourth degree hemorrhoids: Secondary | ICD-10-CM | POA: Diagnosis not present

## 2021-10-15 DIAGNOSIS — Z7984 Long term (current) use of oral hypoglycemic drugs: Secondary | ICD-10-CM

## 2021-10-15 DIAGNOSIS — Z794 Long term (current) use of insulin: Secondary | ICD-10-CM

## 2021-10-15 DIAGNOSIS — E782 Mixed hyperlipidemia: Secondary | ICD-10-CM | POA: Diagnosis present

## 2021-10-15 DIAGNOSIS — I4819 Other persistent atrial fibrillation: Secondary | ICD-10-CM | POA: Diagnosis not present

## 2021-10-15 DIAGNOSIS — R0602 Shortness of breath: Secondary | ICD-10-CM | POA: Diagnosis not present

## 2021-10-15 DIAGNOSIS — Z7985 Long-term (current) use of injectable non-insulin antidiabetic drugs: Secondary | ICD-10-CM

## 2021-10-15 DIAGNOSIS — Z91041 Radiographic dye allergy status: Secondary | ICD-10-CM

## 2021-10-15 DIAGNOSIS — E872 Acidosis, unspecified: Secondary | ICD-10-CM | POA: Diagnosis present

## 2021-10-15 DIAGNOSIS — K635 Polyp of colon: Secondary | ICD-10-CM | POA: Diagnosis not present

## 2021-10-15 DIAGNOSIS — K644 Residual hemorrhoidal skin tags: Secondary | ICD-10-CM | POA: Diagnosis present

## 2021-10-15 DIAGNOSIS — K921 Melena: Secondary | ICD-10-CM | POA: Diagnosis present

## 2021-10-15 DIAGNOSIS — N400 Enlarged prostate without lower urinary tract symptoms: Secondary | ICD-10-CM | POA: Diagnosis present

## 2021-10-15 DIAGNOSIS — I2510 Atherosclerotic heart disease of native coronary artery without angina pectoris: Secondary | ICD-10-CM | POA: Diagnosis present

## 2021-10-15 DIAGNOSIS — K2270 Barrett's esophagus without dysplasia: Secondary | ICD-10-CM | POA: Diagnosis not present

## 2021-10-15 DIAGNOSIS — Z8249 Family history of ischemic heart disease and other diseases of the circulatory system: Secondary | ICD-10-CM

## 2021-10-15 DIAGNOSIS — Z833 Family history of diabetes mellitus: Secondary | ICD-10-CM

## 2021-10-15 DIAGNOSIS — R778 Other specified abnormalities of plasma proteins: Secondary | ICD-10-CM | POA: Diagnosis not present

## 2021-10-15 DIAGNOSIS — Z96643 Presence of artificial hip joint, bilateral: Secondary | ICD-10-CM | POA: Diagnosis present

## 2021-10-15 DIAGNOSIS — K649 Unspecified hemorrhoids: Secondary | ICD-10-CM | POA: Diagnosis not present

## 2021-10-15 DIAGNOSIS — I509 Heart failure, unspecified: Secondary | ICD-10-CM | POA: Diagnosis not present

## 2021-10-15 LAB — CBC WITH DIFFERENTIAL/PLATELET
Absolute Monocytes: 396 cells/uL (ref 200–950)
Basophils Absolute: 32 cells/uL (ref 0–200)
Basophils Relative: 0.7 %
Eosinophils Absolute: 110 cells/uL (ref 15–500)
Eosinophils Relative: 2.4 %
HCT: 23.6 % — ABNORMAL LOW (ref 38.5–50.0)
Hemoglobin: 7.6 g/dL — ABNORMAL LOW (ref 13.2–17.1)
Lymphs Abs: 1375 cells/uL (ref 850–3900)
MCH: 29.2 pg (ref 27.0–33.0)
MCHC: 32.2 g/dL (ref 32.0–36.0)
MCV: 90.8 fL (ref 80.0–100.0)
MPV: 10.4 fL (ref 7.5–12.5)
Monocytes Relative: 8.6 %
Neutro Abs: 2686 cells/uL (ref 1500–7800)
Neutrophils Relative %: 58.4 %
Platelets: 255 10*3/uL (ref 140–400)
RBC: 2.6 10*6/uL — ABNORMAL LOW (ref 4.20–5.80)
RDW: 14.1 % (ref 11.0–15.0)
Total Lymphocyte: 29.9 %
WBC: 4.6 10*3/uL (ref 3.8–10.8)

## 2021-10-15 NOTE — Telephone Encounter (Signed)
Phoned and LMOVM for the pt to return call 

## 2021-10-15 NOTE — Telephone Encounter (Signed)
FYI:  Phoned and advised the pt of your note and (he is actively bleeding) to proceed to the ED. Pt may need a stat CTA. Pt advised "so we are putting him off on the ED". I advised the pt that if he is actively bleeding they can do test there we cannot do. Pt expressed understanding.

## 2021-10-15 NOTE — Addendum Note (Signed)
Addended by: Annitta Needs on: 10/15/2021 12:06 PM   Modules accepted: Orders

## 2021-10-15 NOTE — Telephone Encounter (Signed)
If he is feeling short of breath and significant bleeding, I recommend ED evaluation. He may ultimately need repeat colonoscopy as last was with fair prep and had stool in entire colon. I would be considered about diverticular bleed and could possibly need stat CTA.   As he is bleeding actively, it would be best to catch this in real time and be seen in the ED so he can be evaluated.

## 2021-10-15 NOTE — Telephone Encounter (Signed)
Noted and labs released

## 2021-10-15 NOTE — Telephone Encounter (Signed)
Called and spoke with patient as well.   He notes worsening shortness of breath for the past week. Bright red blood per rectum with bowel movements. He wants to avoid the emergency room. I discussed with him the ED would be the quickest route, but he would like to try and manage this with blood work first and then present to the ED if needed.   Dena:   Please release the CBC order I have placed. I have ordered it as stat.  Patient is aware to present to the ED if any worsening symptoms.  Ultimately, I discussed with patient that he may end up needing to present there after review of blood work. IF hemoglobin is stable from last value, we can arrange a colonoscopy as outpatient. IF not, he will need to present to the ED. He will hold his Eliquis starting now until further notice.

## 2021-10-16 ENCOUNTER — Encounter (HOSPITAL_COMMUNITY): Payer: Self-pay

## 2021-10-16 ENCOUNTER — Inpatient Hospital Stay (HOSPITAL_COMMUNITY)
Admission: EM | Admit: 2021-10-16 | Discharge: 2021-10-18 | DRG: 393 | Disposition: A | Discharge status: Home / Self Care | Payer: Medicare Other | Attending: Internal Medicine | Admitting: Internal Medicine

## 2021-10-16 ENCOUNTER — Emergency Department (HOSPITAL_COMMUNITY): Payer: Medicare Other

## 2021-10-16 ENCOUNTER — Other Ambulatory Visit: Payer: Self-pay

## 2021-10-16 DIAGNOSIS — K921 Melena: Secondary | ICD-10-CM | POA: Diagnosis present

## 2021-10-16 DIAGNOSIS — I5023 Acute on chronic systolic (congestive) heart failure: Secondary | ICD-10-CM | POA: Diagnosis present

## 2021-10-16 DIAGNOSIS — D649 Anemia, unspecified: Secondary | ICD-10-CM | POA: Diagnosis present

## 2021-10-16 DIAGNOSIS — E782 Mixed hyperlipidemia: Secondary | ICD-10-CM | POA: Diagnosis present

## 2021-10-16 DIAGNOSIS — Z85828 Personal history of other malignant neoplasm of skin: Secondary | ICD-10-CM | POA: Diagnosis not present

## 2021-10-16 DIAGNOSIS — K644 Residual hemorrhoidal skin tags: Secondary | ICD-10-CM | POA: Diagnosis present

## 2021-10-16 DIAGNOSIS — I4819 Other persistent atrial fibrillation: Secondary | ICD-10-CM | POA: Diagnosis present

## 2021-10-16 DIAGNOSIS — K625 Hemorrhage of anus and rectum: Secondary | ICD-10-CM | POA: Diagnosis not present

## 2021-10-16 DIAGNOSIS — D126 Benign neoplasm of colon, unspecified: Secondary | ICD-10-CM | POA: Diagnosis not present

## 2021-10-16 DIAGNOSIS — E66813 Obesity, class 3: Secondary | ICD-10-CM

## 2021-10-16 DIAGNOSIS — K643 Fourth degree hemorrhoids: Secondary | ICD-10-CM | POA: Diagnosis present

## 2021-10-16 DIAGNOSIS — Z79899 Other long term (current) drug therapy: Secondary | ICD-10-CM | POA: Diagnosis not present

## 2021-10-16 DIAGNOSIS — Z23 Encounter for immunization: Secondary | ICD-10-CM | POA: Diagnosis not present

## 2021-10-16 DIAGNOSIS — I110 Hypertensive heart disease with heart failure: Secondary | ICD-10-CM | POA: Diagnosis present

## 2021-10-16 DIAGNOSIS — K219 Gastro-esophageal reflux disease without esophagitis: Secondary | ICD-10-CM | POA: Diagnosis present

## 2021-10-16 DIAGNOSIS — Z6841 Body Mass Index (BMI) 40.0 and over, adult: Secondary | ICD-10-CM | POA: Diagnosis not present

## 2021-10-16 DIAGNOSIS — E1165 Type 2 diabetes mellitus with hyperglycemia: Secondary | ICD-10-CM | POA: Diagnosis present

## 2021-10-16 DIAGNOSIS — E872 Acidosis, unspecified: Secondary | ICD-10-CM

## 2021-10-16 DIAGNOSIS — K635 Polyp of colon: Secondary | ICD-10-CM | POA: Diagnosis not present

## 2021-10-16 DIAGNOSIS — I509 Heart failure, unspecified: Secondary | ICD-10-CM | POA: Diagnosis not present

## 2021-10-16 DIAGNOSIS — M1990 Unspecified osteoarthritis, unspecified site: Secondary | ICD-10-CM | POA: Diagnosis present

## 2021-10-16 DIAGNOSIS — Z794 Long term (current) use of insulin: Secondary | ICD-10-CM

## 2021-10-16 DIAGNOSIS — H409 Unspecified glaucoma: Secondary | ICD-10-CM | POA: Diagnosis present

## 2021-10-16 DIAGNOSIS — N400 Enlarged prostate without lower urinary tract symptoms: Secondary | ICD-10-CM | POA: Diagnosis present

## 2021-10-16 DIAGNOSIS — R0602 Shortness of breath: Secondary | ICD-10-CM | POA: Diagnosis not present

## 2021-10-16 DIAGNOSIS — D62 Acute posthemorrhagic anemia: Secondary | ICD-10-CM | POA: Diagnosis present

## 2021-10-16 DIAGNOSIS — K2270 Barrett's esophagus without dysplasia: Secondary | ICD-10-CM | POA: Diagnosis not present

## 2021-10-16 DIAGNOSIS — K649 Unspecified hemorrhoids: Secondary | ICD-10-CM

## 2021-10-16 DIAGNOSIS — Z8261 Family history of arthritis: Secondary | ICD-10-CM | POA: Diagnosis not present

## 2021-10-16 DIAGNOSIS — R778 Other specified abnormalities of plasma proteins: Secondary | ICD-10-CM

## 2021-10-16 DIAGNOSIS — G4733 Obstructive sleep apnea (adult) (pediatric): Secondary | ICD-10-CM | POA: Diagnosis present

## 2021-10-16 DIAGNOSIS — R7989 Other specified abnormal findings of blood chemistry: Secondary | ICD-10-CM

## 2021-10-16 DIAGNOSIS — I2510 Atherosclerotic heart disease of native coronary artery without angina pectoris: Secondary | ICD-10-CM | POA: Diagnosis present

## 2021-10-16 LAB — GLUCOSE, CAPILLARY
Glucose-Capillary: 101 mg/dL — ABNORMAL HIGH (ref 70–99)
Glucose-Capillary: 105 mg/dL — ABNORMAL HIGH (ref 70–99)
Glucose-Capillary: 99 mg/dL (ref 70–99)

## 2021-10-16 LAB — COMPREHENSIVE METABOLIC PANEL
ALT: 20 U/L (ref 0–44)
AST: 21 U/L (ref 15–41)
Albumin: 3.6 g/dL (ref 3.5–5.0)
Alkaline Phosphatase: 44 U/L (ref 38–126)
Anion gap: 9 (ref 5–15)
BUN: 11 mg/dL (ref 8–23)
CO2: 21 mmol/L — ABNORMAL LOW (ref 22–32)
Calcium: 8.3 mg/dL — ABNORMAL LOW (ref 8.9–10.3)
Chloride: 106 mmol/L (ref 98–111)
Creatinine, Ser: 0.86 mg/dL (ref 0.61–1.24)
GFR, Estimated: >60 mL/min (ref >60)
Glucose, Bld: 242 mg/dL — ABNORMAL HIGH (ref 70–99)
Potassium: 3.5 mmol/L (ref 3.5–5.1)
Sodium: 136 mmol/L (ref 135–145)
Total Bilirubin: 0.5 mg/dL (ref 0.3–1.2)
Total Protein: 6.5 g/dL (ref 6.5–8.1)

## 2021-10-16 LAB — CBC
HCT: 22.4 % — ABNORMAL LOW (ref 39.0–52.0)
Hemoglobin: 7 g/dL — ABNORMAL LOW (ref 13.0–17.0)
MCH: 29.3 pg (ref 26.0–34.0)
MCHC: 31.3 g/dL (ref 30.0–36.0)
MCV: 93.7 fL (ref 80.0–100.0)
Platelets: 229 10*3/uL (ref 150–400)
RBC: 2.39 MIL/uL — ABNORMAL LOW (ref 4.22–5.81)
RDW: 14.4 % (ref 11.5–15.5)
WBC: 4.7 10*3/uL (ref 4.0–10.5)
nRBC: 0.4 % — ABNORMAL HIGH (ref 0.0–0.2)

## 2021-10-16 LAB — TROPONIN I (HIGH SENSITIVITY)
Troponin I (High Sensitivity): 27 ng/L — ABNORMAL HIGH (ref <18)
Troponin I (High Sensitivity): 89 ng/L — ABNORMAL HIGH (ref <18)
Troponin I (High Sensitivity): 59 ng/L — ABNORMAL HIGH (ref <18)
Troponin I (High Sensitivity): 52 ng/L — ABNORMAL HIGH (ref <18)

## 2021-10-16 LAB — POC OCCULT BLOOD, ED: Fecal Occult Bld: POSITIVE — AB

## 2021-10-16 LAB — HEMOGLOBIN AND HEMATOCRIT, BLOOD
HCT: 27.3 % — ABNORMAL LOW (ref 39.0–52.0)
Hemoglobin: 8.8 g/dL — ABNORMAL LOW (ref 13.0–17.0)

## 2021-10-16 LAB — PREPARE RBC (CROSSMATCH)

## 2021-10-16 LAB — BRAIN NATRIURETIC PEPTIDE: B Natriuretic Peptide: 30 pg/mL (ref 0.0–100.0)

## 2021-10-16 LAB — CBG MONITORING, ED
Glucose-Capillary: 132 mg/dL — ABNORMAL HIGH (ref 70–99)
Glucose-Capillary: 139 mg/dL — ABNORMAL HIGH (ref 70–99)

## 2021-10-16 MED ORDER — FUROSEMIDE 10 MG/ML IJ SOLN
20.0000 mg | Freq: Once | INTRAMUSCULAR | Status: AC
  Administered 2021-10-16: 20 mg via INTRAVENOUS
  Filled 2021-10-16: qty 2

## 2021-10-16 MED ORDER — ADULT MULTIVITAMIN W/MINERALS CH
1.0000 | ORAL_TABLET | Freq: Every day | ORAL | Status: DC
  Administered 2021-10-17 – 2021-10-18 (×2): 1 via ORAL
  Filled 2021-10-16 (×2): qty 1

## 2021-10-16 MED ORDER — INFLUENZA VAC A&B SA ADJ QUAD 0.5 ML IM PRSY: 0.5000 mL | PREFILLED_SYRINGE | INTRAMUSCULAR | Status: DC

## 2021-10-16 MED ORDER — SIMVASTATIN 20 MG PO TABS
20.0000 mg | ORAL_TABLET | Freq: Every day | ORAL | Status: DC
  Administered 2021-10-16 – 2021-10-17 (×2): 20 mg via ORAL
  Filled 2021-10-16 (×2): qty 1

## 2021-10-16 MED ORDER — BISACODYL 5 MG PO TBEC: 10.0000 mg | DELAYED_RELEASE_TABLET | Freq: Every day | ORAL | Status: DC | PRN

## 2021-10-16 MED ORDER — INSULIN GLARGINE-YFGN 100 UNIT/ML ~~LOC~~ SOLN
10.0000 [IU] | Freq: Every day | SUBCUTANEOUS | Status: DC
  Administered 2021-10-16 – 2021-10-17 (×2): 10 [IU] via SUBCUTANEOUS
  Filled 2021-10-16 (×3): qty 0.1

## 2021-10-16 MED ORDER — INSULIN ASPART 100 UNIT/ML IJ SOLN: 0.0000 [IU] | Freq: Every day | INTRAMUSCULAR | Status: DC

## 2021-10-16 MED ORDER — SODIUM CHLORIDE 0.9 % IV SOLN
10.0000 mL/h | Freq: Once | INTRAVENOUS | Status: AC
  Administered 2021-10-16: 10 mL/h via INTRAVENOUS

## 2021-10-16 MED ORDER — INSULIN ASPART 100 UNIT/ML IJ SOLN
0.0000 [IU] | Freq: Three times a day (TID) | INTRAMUSCULAR | Status: DC
  Administered 2021-10-16 (×2): 3 [IU] via SUBCUTANEOUS
  Administered 2021-10-17: 4 [IU] via SUBCUTANEOUS
  Administered 2021-10-17: 3 [IU] via SUBCUTANEOUS
  Administered 2021-10-18: 4 [IU] via SUBCUTANEOUS
  Administered 2021-10-18: 3 [IU] via SUBCUTANEOUS
  Filled 2021-10-16 (×2): qty 1

## 2021-10-16 MED ORDER — LATANOPROST 0.005 % OP SOLN
1.0000 [drp] | Freq: Every day | OPHTHALMIC | Status: DC
  Filled 2021-10-16 (×2): qty 2.5

## 2021-10-16 MED ORDER — PANTOPRAZOLE SODIUM 40 MG IV SOLR
40.0000 mg | Freq: Two times a day (BID) | INTRAVENOUS | Status: DC
Start: 2021-10-16 — End: 2021-10-18
  Administered 2021-10-16 – 2021-10-18 (×5): 40 mg via INTRAVENOUS
  Filled 2021-10-16 (×5): qty 10

## 2021-10-16 MED ORDER — PEG 3350-KCL-NA BICARB-NACL 420 G PO SOLR
4000.0000 mL | Freq: Once | ORAL | Status: AC
  Administered 2021-10-16: 4000 mL via ORAL

## 2021-10-16 MED ORDER — ACETAMINOPHEN 500 MG PO TABS: 1000.0000 mg | ORAL_TABLET | Freq: Three times a day (TID) | ORAL | Status: DC | PRN

## 2021-10-16 MED ORDER — SODIUM CHLORIDE 0.9 % IV SOLN: INTRAVENOUS | Status: DC

## 2021-10-16 NOTE — ED Notes (Signed)
Per Venetia Night, NP, pt to get colonoscopy tomorrow morning as well as tap water enema tomorrow morning. Prep ordered to start around 1600 today
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
## 2021-10-16 NOTE — ED Provider Notes (Signed)
[facility] EMERGENCY DEPARTMENT  Provider Note  CSN: [id number] Arrival date & time: 10/15/21 2338  History Chief Complaint  Patient presents with   Rectal Bleeding    Lawrence Jordan is a 71 y.o. male with history of afib on eliquis, CHF on diuretics, reports about 2 weeks of dyspnea with exertion, leg swelling. No CP, fever or cough. He has had bright red blood per rectum for the last several days, went to see GI this morning and was noted to be anemic on labs, scheduled for transfusion in the AM but became more short of breath tonight prompting his wife to bring him in for evaluation.    Home Medications Prior to Admission medications   Medication Sig Start Date End Date Taking? Authorizing Provider  acetaminophen (TYLENOL) 500 MG tablet Take 1,000 mg by mouth every 8 (eight) hours as needed for moderate pain.    [provider]  Blood Glucose Monitoring Suppl (ONETOUCH VERIO) w/Device KIT Use to check blood sugar twice a day 07/31/21   Elayne Snare, MD  calcium carbonate (TUMS EX) 750 MG chewable tablet Chew 1,500 mg by mouth at bedtime.    [provider]  Calcium Carbonate-Vitamin D (CALCIUM-D PO) Take 1 tablet by mouth daily.    [provider]  celecoxib (CELEBREX) 200 MG capsule Take 200 mg by mouth as needed (Takes 3 times weekly). 06/20/21   [provider]  cyclobenzaprine (FLEXERIL) 5 MG tablet SMARTSIG:1 Tablet(s) By Mouth Every 12 Hours PRN 09/05/21   [provider]  ELIQUIS 5 MG TABS tablet TAKE 1 TABLET BY MOUTH TWICE A DAY 08/31/21   [name], MD  ferrous sulfate 325 (65 FE) MG tablet Take 325 mg by mouth daily. 03/09/21   [provider]  furosemide (LASIX) 20 MG tablet TAKE 1 TABLET (20 MG TOTAL) BY MOUTH DAILY. MAY TAKE AN EXTRA TABLET DAILY AS NEEDED FOR WEIGHT GAIN 06/23/21   Allred, Jeneen Rinks, MD  glucose blood (ONETOUCH VERIO) test strip USE ONETOUCH VERIO TEST STRIPS AS INSTRUCTED TO CHECK BLOOD SUGAR 2 TIMES  DAILY.DX:E11.65 01/21/21   Elayne Snare, MD  glucose blood (ONETOUCH VERIO) test strip Use to check blood sugar 2 times daily 07/30/21   Elayne Snare, MD  hydrocortisone 2.5 % cream APPLY TOPICALLY 2 (TWO) TIMES DAILY AS NEEDED (RASH). 10/02/20   Lavonna Monarch, MD  insulin degludec (TRESIBA FLEXTOUCH) 200 UNIT/ML FlexTouch Pen Inject 40 Units into the skin daily. At dinner time Patient taking differently: Inject 44 Units into the skin daily. At dinner time 09/21/21   Elayne Snare, MD  Insulin Pen Needle (BD PEN NEEDLE NANO 2ND GEN) 32G X 4 MM MISC USE PEN NEEDLE AS INSTRUCTED TO INJECT INSULIN 4 TIMES DAILY. Patient taking differently: Inject 30 g into the skin daily. USE PEN NEEDLE AS INSTRUCTED TO INJECT INSULIN 4 TIMES DAILY. 01/21/21   Elayne Snare, MD  JARDIANCE 25 MG TABS tablet TAKE 1 TABLET BY MOUTH EVERY DAY 06/23/21   Elayne Snare, MD  latanoprost (XALATAN) 0.005 % ophthalmic solution Place 1 drop into both eyes at bedtime.  11/14/17   [provider]  levocetirizine (XYZAL) 5 MG tablet Take 5 mg by mouth every evening.    [provider]  losartan (COZAAR) 50 MG tablet Take 50 mg by mouth daily. 06/25/21   [provider]  Magnesium 200 MG TABS Take 1 tablet (200 mg total) by mouth daily. 07/27/16   Sherran Needs, NP  metFORMIN (GLUCOPHAGE)  1000 MG tablet TAKE 1 TABLET TWICE DAILY WITH FOOD, PLEASE MAKE FOLLOW-UP APPOINTMENT 06/23/21   Elayne Snare, MD  metoprolol tartrate (LOPRESSOR) 25 MG tablet Take 1 tablet (25 mg total) by mouth every morning. 09/30/21   Sherran Needs, NP  Multiple Vitamin (MULTIVITAMIN) tablet Take 1 tablet by mouth daily.      [provider]  OneTouch Delica Lancets 08U MISC Use to check blood sugar 2 times daily 06/23/21   Elayne Snare, MD  [facility] DELICA LANCETS FINE MISC USE TO CHECK BLOOD SUGAR 2 TIMES PER DAY dx code E11.9 05/10/17   Elayne Snare, MD  pantoprazole (PROTONIX) 40 MG tablet Take one tablet by mouth once daily 09/30/21    Sherran Needs, NP  potassium chloride SA (KLOR-CON M20) 20 MEQ tablet Take 1 tablet (20 mEq total) by mouth daily. 09/30/21   Sherran Needs, NP  Semaglutide, 2 MG/DOSE, (OZEMPIC, 2 MG/DOSE,) 8 MG/3ML SOPN INJECT TWO MG ONCE WEEKLY 07/07/21   Elayne Snare, MD  simvastatin (ZOCOR) 20 MG tablet TAKE 1 TABLET BY MOUTH DAILY AT 6 PM 06/04/21   Allred, Jeneen Rinks, MD  tadalafil (CIALIS) 20 MG tablet Take 20 mg by mouth daily.    [provider]  tamsulosin (FLOMAX) 0.4 MG CAPS capsule Take 0.4 mg by mouth 2 (two) times daily. 03/29/19   [provider]  tirzepatide Darcel Bayley) 10 MG/0.5ML Pen Inject 10 mg into the skin once a week. 10/05/21   Elayne Snare, MD     Allergies    Iodinated contrast media, Sulfa antibiotics, Adhesive [tape], and Amoxicillin   Review of Systems   Review of Systems Please see HPI for pertinent positives and negatives  Physical Exam BP 120/85   Pulse 95   Temp 98 F (36.7 C) (Oral)   Resp 18   Ht _0  (1.803 m)   Wt (!) 145.2 kg   SpO2 97%   BMI 44.63 kg/m   Physical Exam Vitals and nursing note reviewed.  Constitutional:      Appearance: Normal appearance. He is obese.  HENT:     Head: Normocephalic and atraumatic.     Nose: Nose normal.     Mouth/Throat:     Mouth: Mucous membranes are moist.  Eyes:     Extraocular Movements: Extraocular movements intact.     Conjunctiva/sclera: Conjunctivae normal.  Cardiovascular:     Rate and Rhythm: Normal rate. Rhythm irregular.  Pulmonary:     Effort: Tachypnea present.     Breath sounds: Normal breath sounds.  Abdominal:     General: Abdomen is flat.     Palpations: Abdomen is soft.     Tenderness: There is no abdominal tenderness.  Musculoskeletal:        General: No swelling. Normal range of motion.     Cervical back: Neck supple.     Right lower leg: Edema present.     Left lower leg: Edema present.  Skin:    General: Skin is warm and dry.  Neurological:     General: No focal  deficit present.     Mental Status: He is alert.  Psychiatric:        Mood and Affect: Mood normal.     ED Results / Procedures / Treatments   EKG EKG Interpretation  Date/Time:  Friday October 16 2021 00:10:31 EDT Ventricular Rate:  120 PR Interval:    QRS Duration: 95 QT Interval:  389 QTC Calculation: 550 R Axis:   38  Text Interpretation: Atrial fibrillation Prolonged QT interval Since last tracing Rate faster afib has replaced paced rhythm Confirmed by Calvert Cantor [phone]) on 10/16/2021 12:19:35 AM  Procedures Procedures  Medications Ordered in the ED Medications  0.9 %  sodium chloride infusion (10 mL/hr Intravenous New Bag/Given 10/16/21 0256)    Initial Impression and Plan  Patient on Eliquis has had BRBPR for the last 10 days, now increasing DOE and anemia on outpatient labs. Will recheck labs now, including BNP, add CXR and EKG. Likely will need transfusion for symptomatic anemia but will also need assessment of fluid status.   ED Course   Clinical Course as of 10/16/21 0321  Fri Oct 16, 2021  0042 Patient's hgb continues to trend down. Will order PRBC [CS]  0102 BNP is not elevated.  [CS]  0102 I personally viewed the images from radiology studies and agree with radiologist interpretation: Mild vascular congestion.   [CS]  0111 Rectal: chaperone present, no hemorrhoids or fissures, scant stool in vault. Brown stool. Heme positive [CS]  9480 Will discuss with GI, anticipate admission.  [CS]  0117 CMP with mildly elevated Glucose, otherwise unremarkable. Mildly elevated Trop likely due to anemia. Will continue to trend. Spoke with Dr. Abbey Chatters, GI who agrees with plan for transfusion, admission and he will see the patient tomorrow. Hospitalist paged.  [CS]  0137 Spoke with Dr. Josephine Cables,  Hospitalist, who will evaluate the patient for admission.  [CS]    Clinical Course User Index [CS] Truddie Hidden, MD     MDM Rules/Calculators/A&P Medical Decision  Making Problems Addressed: Rectal bleeding: chronic illness or injury with exacerbation, progression, or side effects of treatment Symptomatic anemia: acute illness or injury  Amount and/or Complexity of Data Reviewed Labs: ordered. Decision-making details documented in ED Course. Radiology: ordered and independent interpretation performed. Decision-making details documented in ED Course.  Risk Prescription drug management. Decision regarding hospitalization.    Final Clinical Impression(s) / ED Diagnoses Final diagnoses:  Rectal bleeding  Symptomatic anemia    Rx / DC Orders ED Discharge Orders     None        Truddie Hidden, MD 10/16/21 [phone]
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
## 2021-10-16 NOTE — Progress Notes (Signed)
  Transition of Care [facility]) Screening Note   Patient Details  Name: CASHTYN POULIOT Date of Birth: 1950-11-17   Transition of Care [facility]) CM/SW Contact:    Iona Beard, [place] Phone Number: 10/16/2021, 11:24 AM    Transition of Care Department [facility]) has reviewed patient and no TOC needs have been identified at this time. We will continue to monitor patient advancement through interdisciplinary progression rounds. If new patient transition needs arise, please place a TOC consult.

## 2021-10-16 NOTE — ED Notes (Signed)
Beside toilet brought to pt room
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
## 2021-10-16 NOTE — ED Notes (Signed)
Pt disconnected from monitor- pt up to bedside toilet
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
## 2021-10-16 NOTE — ED Triage Notes (Addendum)
Pt presents with rectal bleeding that started x1 week ago. Pt states that pt bleeding has gotten worse in last 2 days. Pt was supposed to come to hospital for blood transfusion tomorrow but pt became dizzy, SOB and weakness. Pt hemoglobin 7.2 this morning.  Pt with pacemaker. A fib patient.

## 2021-10-17 ENCOUNTER — Inpatient Hospital Stay (HOSPITAL_COMMUNITY): Payer: Medicare Other | Admitting: Anesthesiology

## 2021-10-17 ENCOUNTER — Encounter (HOSPITAL_COMMUNITY)
Admission: EM | Disposition: A | Discharge status: Home / Self Care | Payer: Self-pay | Source: Home / Self Care | Attending: Internal Medicine

## 2021-10-17 ENCOUNTER — Encounter (HOSPITAL_COMMUNITY): Payer: Self-pay | Admitting: Internal Medicine

## 2021-10-17 DIAGNOSIS — K625 Hemorrhage of anus and rectum: Secondary | ICD-10-CM | POA: Diagnosis not present

## 2021-10-17 DIAGNOSIS — R778 Other specified abnormalities of plasma proteins: Secondary | ICD-10-CM | POA: Diagnosis not present

## 2021-10-17 DIAGNOSIS — K649 Unspecified hemorrhoids: Secondary | ICD-10-CM

## 2021-10-17 DIAGNOSIS — D649 Anemia, unspecified: Secondary | ICD-10-CM | POA: Diagnosis not present

## 2021-10-17 DIAGNOSIS — K921 Melena: Secondary | ICD-10-CM

## 2021-10-17 DIAGNOSIS — K2270 Barrett's esophagus without dysplasia: Secondary | ICD-10-CM | POA: Diagnosis not present

## 2021-10-17 HISTORY — PX: POLYPECTOMY: SHX5525

## 2021-10-17 HISTORY — PX: COLONOSCOPY WITH PROPOFOL: SHX5780

## 2021-10-17 LAB — CBC WITH DIFFERENTIAL/PLATELET
Abs Immature Granulocytes: 0.02 10*3/uL (ref 0.00–0.07)
Abs Immature Granulocytes: 0.02 10*3/uL (ref 0.00–0.07)
Basophils Absolute: 0 10*3/uL (ref 0.0–0.1)
Basophils Absolute: 0 10*3/uL (ref 0.0–0.1)
Basophils Relative: 1 %
Basophils Relative: 0 %
Eosinophils Absolute: 0.1 10*3/uL (ref 0.0–0.5)
Eosinophils Absolute: 0.1 10*3/uL (ref 0.0–0.5)
Eosinophils Relative: 2 %
Eosinophils Relative: 2 %
HCT: 26 % — ABNORMAL LOW (ref 39.0–52.0)
HCT: 28.1 % — ABNORMAL LOW (ref 39.0–52.0)
Hemoglobin: 8.4 g/dL — ABNORMAL LOW (ref 13.0–17.0)
Hemoglobin: 9 g/dL — ABNORMAL LOW (ref 13.0–17.0)
Immature Granulocytes: 0 %
Immature Granulocytes: 0 %
Lymphocytes Relative: 25 %
Lymphocytes Relative: 25 %
Lymphs Abs: 1.4 10*3/uL (ref 0.7–4.0)
Lymphs Abs: 1.8 10*3/uL (ref 0.7–4.0)
MCH: 29.6 pg (ref 26.0–34.0)
MCH: 29.7 pg (ref 26.0–34.0)
MCHC: 32.3 g/dL (ref 30.0–36.0)
MCHC: 32 g/dL (ref 30.0–36.0)
MCV: 91.5 fL (ref 80.0–100.0)
MCV: 92.7 fL (ref 80.0–100.0)
Monocytes Absolute: 0.5 10*3/uL (ref 0.1–1.0)
Monocytes Absolute: 0.6 10*3/uL (ref 0.1–1.0)
Monocytes Relative: 10 %
Monocytes Relative: 8 %
Neutro Abs: 3.4 10*3/uL (ref 1.7–7.7)
Neutro Abs: 4.6 10*3/uL (ref 1.7–7.7)
Neutrophils Relative %: 62 %
Neutrophils Relative %: 65 %
Platelets: 221 10*3/uL (ref 150–400)
Platelets: 244 10*3/uL (ref 150–400)
RBC: 2.84 MIL/uL — ABNORMAL LOW (ref 4.22–5.81)
RBC: 3.03 MIL/uL — ABNORMAL LOW (ref 4.22–5.81)
RDW: 14.6 % (ref 11.5–15.5)
RDW: 14.7 % (ref 11.5–15.5)
WBC: 5.5 10*3/uL (ref 4.0–10.5)
WBC: 7.1 10*3/uL (ref 4.0–10.5)
nRBC: 0 % (ref 0.0–0.2)
nRBC: 0 % (ref 0.0–0.2)

## 2021-10-17 LAB — COMPREHENSIVE METABOLIC PANEL
ALT: 24 U/L (ref 0–44)
AST: 22 U/L (ref 15–41)
Albumin: 3.7 g/dL (ref 3.5–5.0)
Alkaline Phosphatase: 34 U/L — ABNORMAL LOW (ref 38–126)
Anion gap: 7 (ref 5–15)
BUN: 10 mg/dL (ref 8–23)
CO2: 23 mmol/L (ref 22–32)
Calcium: 8.4 mg/dL — ABNORMAL LOW (ref 8.9–10.3)
Chloride: 107 mmol/L (ref 98–111)
Creatinine, Ser: 0.67 mg/dL (ref 0.61–1.24)
GFR, Estimated: >60 mL/min (ref >60)
Glucose, Bld: 129 mg/dL — ABNORMAL HIGH (ref 70–99)
Potassium: 3.5 mmol/L (ref 3.5–5.1)
Sodium: 137 mmol/L (ref 135–145)
Total Bilirubin: 0.9 mg/dL (ref 0.3–1.2)
Total Protein: 6.3 g/dL — ABNORMAL LOW (ref 6.5–8.1)

## 2021-10-17 LAB — GLUCOSE, CAPILLARY
Glucose-Capillary: 130 mg/dL — ABNORMAL HIGH (ref 70–99)
Glucose-Capillary: 145 mg/dL — ABNORMAL HIGH (ref 70–99)
Glucose-Capillary: 136 mg/dL — ABNORMAL HIGH (ref 70–99)
Glucose-Capillary: 116 mg/dL — ABNORMAL HIGH (ref 70–99)
Glucose-Capillary: 96 mg/dL (ref 70–99)

## 2021-10-17 LAB — TYPE AND SCREEN
ABO/RH(D): O NEG
Antibody Screen: NEGATIVE
Unit division: 0
Unit division: 0

## 2021-10-17 LAB — BPAM RBC
Blood Product Expiration Date: 202310242359
Blood Product Expiration Date: 202310242359
ISSUE DATE / TIME: 202309220245
ISSUE DATE / TIME: 202309220556
Unit Type and Rh: 9500
Unit Type and Rh: 9500

## 2021-10-17 LAB — MAGNESIUM: Magnesium: 1.9 mg/dL (ref 1.7–2.4)

## 2021-10-17 LAB — PHOSPHORUS: Phosphorus: 3.2 mg/dL (ref 2.5–4.6)

## 2021-10-17 SURGERY — COLONOSCOPY WITH PROPOFOL
Anesthesia: Monitor Anesthesia Care

## 2021-10-17 MED ORDER — LACTATED RINGERS IV SOLN
INTRAVENOUS | Status: AC | PRN
  Administered 2021-10-17: 1000 mL via INTRAVENOUS

## 2021-10-17 MED ORDER — FUROSEMIDE 10 MG/ML IJ SOLN
20.0000 mg | Freq: Once | INTRAMUSCULAR | Status: AC
  Administered 2021-10-17: 20 mg via INTRAVENOUS
  Filled 2021-10-17: qty 2

## 2021-10-17 MED ORDER — LACTATED RINGERS IV SOLN: INTRAVENOUS | Status: DC | PRN

## 2021-10-17 MED ORDER — ORAL CARE MOUTH RINSE: 15.0000 mL | OROMUCOSAL | Status: DC | PRN

## 2021-10-17 MED ORDER — GLUCAGON HCL RDNA (DIAGNOSTIC) 1 MG IJ SOLR
INTRAMUSCULAR | Status: AC
  Filled 2021-10-17: qty 1

## 2021-10-17 MED ORDER — DOCUSATE SODIUM 100 MG PO CAPS
100.0000 mg | ORAL_CAPSULE | Freq: Two times a day (BID) | ORAL | Status: DC
  Administered 2021-10-17 – 2021-10-18 (×3): 100 mg via ORAL
  Filled 2021-10-17 (×3): qty 1

## 2021-10-17 MED ORDER — PROPOFOL 10 MG/ML IV BOLUS
INTRAVENOUS | Status: DC | PRN
  Administered 2021-10-17 (×3): 200 mg via INTRAVENOUS

## 2021-10-17 MED ORDER — LACTATED RINGERS IV SOLN: INTRAVENOUS | Status: DC

## 2021-10-17 MED ORDER — STERILE WATER FOR IRRIGATION IR SOLN
Status: DC | PRN
  Administered 2021-10-17: .6 mL

## 2021-10-17 NOTE — Progress Notes (Signed)
Completed prep and two tap water enemas.  Output still yellow and cloudy. In shower now
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
## 2021-10-17 NOTE — Transfer of Care (Signed)
Immediate Anesthesia Transfer of Care Note  Patient: Lawrence Jordan  Procedure(s) Performed: COLONOSCOPY WITH PROPOFOL POLYPECTOMY  Patient Location: PACU  Anesthesia Type:General  Level of Consciousness: alert  and drowsy  Airway & Oxygen Therapy: Patient Spontanous Breathing  Post-op Assessment: Report given to RN and Post -op Vital signs reviewed and stable  Post vital signs: Reviewed and stable  Last Vitals:  Vitals Value Taken Time  BP 110/75   Temp 97.9   Pulse 64 10/17/21 1034  Resp 17 10/17/21 1034  SpO2 97 % 10/17/21 1034  Vitals shown include unvalidated device data.  Last Pain:  Vitals:   10/17/21 0958  TempSrc: Oral  PainSc: 3          Complications: No notable events documented.

## 2021-10-17 NOTE — Anesthesia Preprocedure Evaluation (Addendum)
Anesthesia Evaluation  Patient identified by MRN, date of birth, ID band Patient awake    Reviewed: Allergy & Precautions, NPO status , Patient's Chart, lab work & pertinent test results  Airway        Dental   Pulmonary neg pulmonary ROS, shortness of breath, sleep apnea ,           Cardiovascular hypertension, + CAD and +CHF  negative cardio ROS  + pacemaker      Neuro/Psych negative neurological ROS  negative psych ROS   GI/Hepatic negative GI ROS, Neg liver ROS, GERD  Medicated,  Endo/Other  negative endocrine ROSdiabetesMorbid obesity  Renal/GU negative Renal ROS  negative genitourinary   Musculoskeletal negative musculoskeletal ROS (+)   Abdominal   Peds negative pediatric ROS (+)  Hematology negative hematology ROS (+) Blood dyscrasia, anemia ,   Anesthesia Other Findings   Reproductive/Obstetrics negative OB ROS                            Anesthesia Physical Anesthesia Plan  ASA: 3 and emergent  Anesthesia Plan:    Post-op Pain Management:    Induction:   PONV Risk Score and Plan: Propofol infusion  Airway Management Planned:   Additional Equipment:   Intra-op Plan:   Post-operative Plan:   Informed Consent:   Plan Discussed with:   Anesthesia Plan Comments:         Anesthesia Quick Evaluation

## 2021-10-17 NOTE — Op Note (Signed)
[facility] Patient Name: Lawrence Jordan Procedure Date: 10/17/2021 9:20 AM MRN: [id number] Date of Birth: 05-Mar-1950 Attending MD: Norvel Richards , MD CSN: [id number] Age: 71 Admit Type: Inpatient Procedure:                Colonoscopy Indications:              Hematochezia Providers:                Norvel Richards, MD, Gwynneth Albright RN,                            RN, Wynonia Musty Tech, Technician Referring MD:              Medicines:                Midazolam mg IV Complications:            No immediate complications. Estimated Blood Loss:     Estimated blood loss was minimal. Procedure:                Pre-Anesthesia Assessment:                           - Prior to the procedure, a History and Physical                            was performed, and patient medications and                            allergies were reviewed. The patient's tolerance of                            previous anesthesia was also reviewed. The risks                            and benefits of the procedure and the sedation                            options and risks were discussed with the patient.                            All questions were answered, and informed consent                            was obtained. Prior Anticoagulants: The patient                            last took Eliquis (apixaban) 3 days prior to the                            procedure. ASA Grade Assessment: III - A patient                            with severe systemic disease. After reviewing the  risks and benefits, the patient was deemed in                            satisfactory condition to undergo the procedure.                           After obtaining informed consent, the colonoscope                            was passed under direct vision. Throughout the                            procedure, the patient's blood pressure, pulse, and                            oxygen  saturations were monitored continuously. The                            [id number]) scope was introduced through the                            anus and advanced to the 20 cm into the ileum. The                            colonoscopy was performed without difficulty. The                            patient tolerated the procedure well. The quality                            of the bowel preparation was adequate. The                            ileocecal valve, appendiceal orifice, and rectum                            were photographed. Scope In: 10:02:40 AM Scope Out: 10:31:17 AM Scope Withdrawal Time: 0 hours 20 minutes 9 seconds  Total Procedure Duration: 0 hours 28 minutes 37 seconds  Findings:      Multilobulated large, engorged, grade 4 hemorrhoids present. Multiple       areas of thrombus in these veins. 2 areas of excoriation/ulceration       overlying the mucosa in the anal canal.      Rectum well seen and appeared normal. Hemorrhoids fixed and external.       Single mid sigmoid diverticulum appeared innocent. No blood in the       colon. Otherwise, the colonic mucosa appeared entirely normal aside from       a 5 mm cecal polyp which was cold snare removed. Distal 20 cm of TI       inspected. Mucosa appeared normal. No blood or clot in this segment of       the GI tract. Impression:               - Large multilobulated grade 4 hemorrhoids with  stigmata of recent bleeding. Normal rectum. Single                            innocent appearing sigmoid diverticulum and cecal                            polyp removed. Remainder of the colonic mucosa,                            distal 20 cm of terminal ileum appeared normal.                           -No evidence of bleeding in the rectum, colon or                            terminal ileum.                           -I suspect major bleeding from very vascular                            hemorrhoidal  plexus in the setting of                            anticoagulation.. I doubt an occult vascular lesion                            such as a Dieulafoy at this point in his                            evaluation..                           -Recurrent bleeding likely in the setting of future                            anticoagulation. Moderate Sedation:      Moderate (conscious) sedation was personally administered by an       anesthesia professional. The following parameters were monitored: oxygen       saturation, heart rate, blood pressure, respiratory rate, EKG, adequacy       of pulmonary ventilation, and response to care. Recommendation:           - - Advance diet as tolerated.                           - Continue present medications.                           - Repeat colonoscopy date to be determined after                            pending pathology results are reviewed for                            surveillance.                           -  Return patient to hospital ward for observation.                            Colace twice daily. Avoid straining. Definitive                            therapy here would be an extensive                            hemorrhoidectomy. Consider surgical consultation.                           -I discussed my findings with the patient in PACU                            as well as Lawrence Jordan, patient's wife, at [phone] Procedure Code(s):        --- Professional ---                           [phone], Colonoscopy, flexible; diagnostic, including                            collection of specimen(s) by brushing or washing,                            when performed (separate procedure) Diagnosis Code(s):        --- Professional ---                           K64.9, Unspecified hemorrhoids                           K92.1, Melena (includes Hematochezia)                           K57.30, Diverticulosis of large intestine without                             perforation or abscess without bleeding CPT copyright 2019 [employer]. All rights reserved. The codes documented in this report are preliminary and upon coder review may  be revised to meet current compliance requirements. Lawrence Estimable. Muskaan Smet, MD Norvel Richards, MD 10/17/2021 11:03:35 AM This report has been signed electronically. Number of Addenda: 0

## 2021-10-17 NOTE — Interval H&P Note (Signed)
History and Physical Interval Note:  10/17/2021 9:55 AM  Lawrence Jordan  has presented today for surgery, with the diagnosis of Rectal bleeding, acute blood loss anemia.  The various methods of treatment have been discussed with the patient and family. After consideration of risks, benefits and other options for treatment, the patient has consented to  Procedure(s): COLONOSCOPY WITH PROPOFOL (N/A) as a surgical intervention.  The patient's history has been reviewed, patient examined, no change in status, stable for surgery.  I have reviewed the patient's chart and labs.  Questions were answered to the patient's satisfaction.     Lawrence Jordan  Patient seen and examined in short stay.  Total of 2 units infused since admission hemoglobin 8.4 this morning.  Reportedly did well with prep.  Had a moderately large volume fresh bloody stool overnight with prep.  He remains hemodynamically stable.  BUN remains well within normal limits.  Reviewed plans for diagnostic colonoscopy with therapeutic intent as feasible/appropriate this morning.  Questions answered.  All parties agreeable.  Further recommendations to follow.

## 2021-10-17 NOTE — Anesthesia Postprocedure Evaluation (Signed)
Anesthesia Post Note  Patient: Lawrence Jordan  Procedure(s) Performed: COLONOSCOPY WITH PROPOFOL POLYPECTOMY  Patient location during evaluation: Phase II Anesthesia Type: General Level of consciousness: patient remains intubated per anesthesia plan Pain management: pain level controlled Vital Signs Assessment: post-procedure vital signs reviewed and stable Respiratory status: spontaneous breathing and respiratory function stable Cardiovascular status: blood pressure returned to baseline and stable Postop Assessment: no headache and no apparent nausea or vomiting Anesthetic complications: no Comments: Late entry   No notable events documented.   Last Vitals:  Vitals:   10/17/21 0958 10/17/21 1033  BP: (!) 144/104   Pulse: (!) 114 64  Resp: 20 17  Temp: 36.8 C 36.6 C  SpO2: 98% 97%    Last Pain:  Vitals:   10/17/21 0958  TempSrc: Oral  PainSc: [place]

## 2021-10-18 DIAGNOSIS — K2270 Barrett's esophagus without dysplasia: Secondary | ICD-10-CM | POA: Diagnosis not present

## 2021-10-18 DIAGNOSIS — K649 Unspecified hemorrhoids: Secondary | ICD-10-CM

## 2021-10-18 DIAGNOSIS — D649 Anemia, unspecified: Secondary | ICD-10-CM | POA: Diagnosis not present

## 2021-10-18 DIAGNOSIS — K625 Hemorrhage of anus and rectum: Secondary | ICD-10-CM | POA: Diagnosis not present

## 2021-10-18 DIAGNOSIS — K643 Fourth degree hemorrhoids: Principal | ICD-10-CM

## 2021-10-18 DIAGNOSIS — I5023 Acute on chronic systolic (congestive) heart failure: Secondary | ICD-10-CM | POA: Diagnosis not present

## 2021-10-18 DIAGNOSIS — E872 Acidosis, unspecified: Secondary | ICD-10-CM

## 2021-10-18 DIAGNOSIS — K219 Gastro-esophageal reflux disease without esophagitis: Secondary | ICD-10-CM

## 2021-10-18 LAB — MAGNESIUM: Magnesium: 1.8 mg/dL (ref 1.7–2.4)

## 2021-10-18 LAB — COMPREHENSIVE METABOLIC PANEL
ALT: 29 U/L (ref 0–44)
AST: 27 U/L (ref 15–41)
Albumin: 4 g/dL (ref 3.5–5.0)
Alkaline Phosphatase: 36 U/L — ABNORMAL LOW (ref 38–126)
Anion gap: 8 (ref 5–15)
BUN: 9 mg/dL (ref 8–23)
CO2: 24 mmol/L (ref 22–32)
Calcium: 8.9 mg/dL (ref 8.9–10.3)
Chloride: 106 mmol/L (ref 98–111)
Creatinine, Ser: 0.73 mg/dL (ref 0.61–1.24)
GFR, Estimated: >60 mL/min (ref >60)
Glucose, Bld: 184 mg/dL — ABNORMAL HIGH (ref 70–99)
Potassium: 3.6 mmol/L (ref 3.5–5.1)
Sodium: 138 mmol/L (ref 135–145)
Total Bilirubin: 0.9 mg/dL (ref 0.3–1.2)
Total Protein: 7 g/dL (ref 6.5–8.1)

## 2021-10-18 LAB — PHOSPHORUS: Phosphorus: 3.4 mg/dL (ref 2.5–4.6)

## 2021-10-18 LAB — GLUCOSE, CAPILLARY: Glucose-Capillary: 141 mg/dL — ABNORMAL HIGH (ref 70–99)

## 2021-10-18 MED ORDER — TIRZEPATIDE 10 MG/0.5ML ~~LOC~~ SOAJ: 10.0000 mg | SUBCUTANEOUS | 0 refills | Status: DC

## 2021-10-18 MED ORDER — BISACODYL 5 MG PO TBEC: 10.0000 mg | DELAYED_RELEASE_TABLET | Freq: Every day | ORAL | 0 refills | Status: DC | PRN

## 2021-10-18 MED ORDER — MAGNESIUM SULFATE 2 GM/50ML IV SOLN
2.0000 g | Freq: Once | INTRAVENOUS | Status: AC
  Administered 2021-10-18: 2 g via INTRAVENOUS
  Filled 2021-10-18: qty 50

## 2021-10-18 MED ORDER — APIXABAN 5 MG PO TABS: 5.0000 mg | ORAL_TABLET | Freq: Two times a day (BID) | ORAL | 1 refills | Status: DC

## 2021-10-18 MED ORDER — DOCUSATE SODIUM 100 MG PO CAPS: 100.0000 mg | ORAL_CAPSULE | Freq: Two times a day (BID) | ORAL | 0 refills | Status: DC
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
## 2021-10-18 NOTE — Discharge Instructions (Signed)
Do not restart your Eliquis until after surgery.  Do not take Ozempic or Mounjuro before surgery. You have to be off of these medications for at least 7 days before.

## 2021-10-18 NOTE — Progress Notes (Signed)
Patient doing well this morning hemoglobin 9.  No further bleeding. Discussed with Drs Alfredia Ferguson and Constance Haw. Plans for hemorrhoidectomy the first of next month.  Agree with patient staying off Eliquis until surgery has been performed. 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   Thank you very much for allowing me to participate in this nice gentleman's care.

## 2021-10-18 NOTE — Progress Notes (Signed)
Pt has discharge orders, discharge teaching given and no further questions at this time. Pt wheeled down to main entrance by w/c via staff to vehicle accompanied by her wife.

## 2021-10-19 LAB — GLUCOSE, CAPILLARY
Glucose-Capillary: 160 mg/dL — ABNORMAL HIGH (ref 70–99)
Glucose-Capillary: 170 mg/dL — ABNORMAL HIGH (ref 70–99)
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
## 2021-10-20 LAB — SURGICAL PATHOLOGY

## 2021-10-21 ENCOUNTER — Encounter: Payer: Self-pay | Admitting: Internal Medicine

## 2021-10-21 DIAGNOSIS — L905 Scar conditions and fibrosis of skin: Secondary | ICD-10-CM | POA: Diagnosis not present

## 2021-10-22 ENCOUNTER — Encounter (HOSPITAL_COMMUNITY)
Admission: RE | Admit: 2021-10-22 | Discharge: 2021-10-22 | Disposition: A | Discharge status: Home / Self Care | Payer: Medicare Other | Source: Ambulatory Visit | Attending: General Surgery | Admitting: General Surgery

## 2021-10-22 ENCOUNTER — Encounter (HOSPITAL_COMMUNITY): Payer: Self-pay

## 2021-10-22 HISTORY — DX: Presence of cardiac pacemaker: Z95.0

## 2021-10-26 ENCOUNTER — Other Ambulatory Visit: Payer: Self-pay

## 2021-10-26 ENCOUNTER — Ambulatory Visit (HOSPITAL_COMMUNITY)
Admission: RE | Admit: 2021-10-26 | Discharge: 2021-10-26 | Disposition: A | Discharge status: Home / Self Care | Payer: Medicare Other | Source: Ambulatory Visit | Attending: General Surgery | Admitting: General Surgery

## 2021-10-26 ENCOUNTER — Encounter (HOSPITAL_COMMUNITY)
Admission: RE | Disposition: A | Discharge status: Home / Self Care | Payer: Self-pay | Source: Ambulatory Visit | Attending: General Surgery

## 2021-10-26 ENCOUNTER — Encounter (HOSPITAL_COMMUNITY): Payer: Self-pay | Admitting: General Surgery

## 2021-10-26 ENCOUNTER — Ambulatory Visit (HOSPITAL_BASED_OUTPATIENT_CLINIC_OR_DEPARTMENT_OTHER): Payer: Medicare Other | Admitting: Anesthesiology

## 2021-10-26 ENCOUNTER — Ambulatory Visit (HOSPITAL_COMMUNITY): Payer: Medicare Other | Admitting: Anesthesiology

## 2021-10-26 DIAGNOSIS — K219 Gastro-esophageal reflux disease without esophagitis: Secondary | ICD-10-CM | POA: Diagnosis not present

## 2021-10-26 DIAGNOSIS — K648 Other hemorrhoids: Secondary | ICD-10-CM | POA: Diagnosis not present

## 2021-10-26 DIAGNOSIS — Z950 Presence of cardiac pacemaker: Secondary | ICD-10-CM | POA: Insufficient documentation

## 2021-10-26 DIAGNOSIS — Z7984 Long term (current) use of oral hypoglycemic drugs: Secondary | ICD-10-CM | POA: Diagnosis not present

## 2021-10-26 DIAGNOSIS — G4730 Sleep apnea, unspecified: Secondary | ICD-10-CM | POA: Diagnosis not present

## 2021-10-26 DIAGNOSIS — M1990 Unspecified osteoarthritis, unspecified site: Secondary | ICD-10-CM | POA: Insufficient documentation

## 2021-10-26 DIAGNOSIS — I509 Heart failure, unspecified: Secondary | ICD-10-CM | POA: Insufficient documentation

## 2021-10-26 DIAGNOSIS — K643 Fourth degree hemorrhoids: Secondary | ICD-10-CM | POA: Insufficient documentation

## 2021-10-26 DIAGNOSIS — Z6841 Body Mass Index (BMI) 40.0 and over, adult: Secondary | ICD-10-CM | POA: Diagnosis not present

## 2021-10-26 DIAGNOSIS — I5032 Chronic diastolic (congestive) heart failure: Secondary | ICD-10-CM

## 2021-10-26 DIAGNOSIS — K644 Residual hemorrhoidal skin tags: Secondary | ICD-10-CM | POA: Diagnosis not present

## 2021-10-26 DIAGNOSIS — I4891 Unspecified atrial fibrillation: Secondary | ICD-10-CM | POA: Diagnosis not present

## 2021-10-26 DIAGNOSIS — I110 Hypertensive heart disease with heart failure: Secondary | ICD-10-CM

## 2021-10-26 DIAGNOSIS — K625 Hemorrhage of anus and rectum: Secondary | ICD-10-CM | POA: Diagnosis not present

## 2021-10-26 DIAGNOSIS — I080 Rheumatic disorders of both mitral and aortic valves: Secondary | ICD-10-CM | POA: Insufficient documentation

## 2021-10-26 DIAGNOSIS — K649 Unspecified hemorrhoids: Secondary | ICD-10-CM | POA: Diagnosis not present

## 2021-10-26 DIAGNOSIS — I2510 Atherosclerotic heart disease of native coronary artery without angina pectoris: Secondary | ICD-10-CM

## 2021-10-26 DIAGNOSIS — E119 Type 2 diabetes mellitus without complications: Secondary | ICD-10-CM | POA: Diagnosis not present

## 2021-10-26 DIAGNOSIS — D649 Anemia, unspecified: Secondary | ICD-10-CM | POA: Diagnosis not present

## 2021-10-26 DIAGNOSIS — Z7901 Long term (current) use of anticoagulants: Secondary | ICD-10-CM | POA: Insufficient documentation

## 2021-10-26 DIAGNOSIS — R0602 Shortness of breath: Secondary | ICD-10-CM | POA: Diagnosis not present

## 2021-10-26 HISTORY — PX: HEMORRHOID SURGERY: SHX153

## 2021-10-26 LAB — GLUCOSE, CAPILLARY: Glucose-Capillary: 121 mg/dL — ABNORMAL HIGH (ref 70–99)

## 2021-10-26 SURGERY — HEMORRHOIDECTOMY
Anesthesia: General | Site: Rectum

## 2021-10-26 MED ORDER — PROPOFOL 10 MG/ML IV BOLUS
INTRAVENOUS | Status: DC | PRN
  Administered 2021-10-26: 150 mg via INTRAVENOUS

## 2021-10-26 MED ORDER — LIDOCAINE VISCOUS HCL 2 % MT SOLN
OROMUCOSAL | Status: AC
  Filled 2021-10-26: qty 15

## 2021-10-26 MED ORDER — BUPIVACAINE LIPOSOME 1.3 % IJ SUSP
INTRAMUSCULAR | Status: AC
  Filled 2021-10-26: qty 20

## 2021-10-26 MED ORDER — FENTANYL CITRATE (PF) 100 MCG/2ML IJ SOLN
INTRAMUSCULAR | Status: AC
  Filled 2021-10-26: qty 2

## 2021-10-26 MED ORDER — DOCUSATE SODIUM 100 MG PO CAPS: 100.0000 mg | ORAL_CAPSULE | Freq: Two times a day (BID) | ORAL | 0 refills | Status: DC

## 2021-10-26 MED ORDER — SODIUM CHLORIDE 0.9 % IR SOLN
Status: DC | PRN
  Administered 2021-10-26: 1000 mL

## 2021-10-26 MED ORDER — METRONIDAZOLE 500 MG/100ML IV SOLN
500.0000 mg | INTRAVENOUS | Status: AC
  Administered 2021-10-26: 500 mg via INTRAVENOUS

## 2021-10-26 MED ORDER — ORAL CARE MOUTH RINSE: 15.0000 mL | Freq: Once | OROMUCOSAL | Status: AC

## 2021-10-26 MED ORDER — ROCURONIUM BROMIDE 10 MG/ML (PF) SYRINGE
PREFILLED_SYRINGE | INTRAVENOUS | Status: AC
  Filled 2021-10-26: qty 10

## 2021-10-26 MED ORDER — CHLORHEXIDINE GLUCONATE CLOTH 2 % EX PADS
6.0000 | MEDICATED_PAD | Freq: Once | CUTANEOUS | Status: AC
  Administered 2021-10-26: 6 via TOPICAL

## 2021-10-26 MED ORDER — ONDANSETRON HCL 4 MG/2ML IJ SOLN
INTRAMUSCULAR | Status: DC | PRN
  Administered 2021-10-26: 4 mg via INTRAVENOUS

## 2021-10-26 MED ORDER — DOCUSATE SODIUM 100 MG PO CAPS: 100.0000 mg | ORAL_CAPSULE | Freq: Two times a day (BID) | ORAL | 2 refills | Status: DC

## 2021-10-26 MED ORDER — ONDANSETRON HCL 4 MG/2ML IJ SOLN
INTRAMUSCULAR | Status: AC
  Filled 2021-10-26: qty 2

## 2021-10-26 MED ORDER — SUGAMMADEX SODIUM 500 MG/5ML IV SOLN
INTRAVENOUS | Status: AC
  Filled 2021-10-26: qty 5

## 2021-10-26 MED ORDER — CHLORHEXIDINE GLUCONATE CLOTH 2 % EX PADS: 6.0000 | MEDICATED_PAD | Freq: Once | CUTANEOUS | Status: DC

## 2021-10-26 MED ORDER — CHLORHEXIDINE GLUCONATE 0.12 % MT SOLN
OROMUCOSAL | Status: AC
  Administered 2021-10-26: 15 mL via OROMUCOSAL
  Filled 2021-10-26: qty 15

## 2021-10-26 MED ORDER — LIDOCAINE HCL (PF) 2 % IJ SOLN
INTRAMUSCULAR | Status: AC
  Filled 2021-10-26: qty 5

## 2021-10-26 MED ORDER — ONDANSETRON HCL 4 MG/2ML IJ SOLN: 4.0000 mg | Freq: Once | INTRAMUSCULAR | Status: DC | PRN

## 2021-10-26 MED ORDER — FENTANYL CITRATE (PF) 100 MCG/2ML IJ SOLN
INTRAMUSCULAR | Status: DC | PRN
  Administered 2021-10-26 (×4): 50 ug via INTRAVENOUS

## 2021-10-26 MED ORDER — LACTATED RINGERS IV SOLN: INTRAVENOUS | Status: DC

## 2021-10-26 MED ORDER — SUGAMMADEX SODIUM 500 MG/5ML IV SOLN
INTRAVENOUS | Status: DC | PRN
  Administered 2021-10-26: 569.6 mg via INTRAVENOUS

## 2021-10-26 MED ORDER — LIDOCAINE VISCOUS HCL 2 % MT SOLN
OROMUCOSAL | Status: DC | PRN
  Administered 2021-10-26: 1 via OROMUCOSAL

## 2021-10-26 MED ORDER — CIPROFLOXACIN IN D5W 400 MG/200ML IV SOLN
INTRAVENOUS | Status: AC
  Filled 2021-10-26: qty 200

## 2021-10-26 MED ORDER — HYDROMORPHONE HCL 1 MG/ML IJ SOLN: 0.2500 mg | INTRAMUSCULAR | Status: DC | PRN

## 2021-10-26 MED ORDER — METRONIDAZOLE 500 MG/100ML IV SOLN
INTRAVENOUS | Status: AC
  Filled 2021-10-26: qty 100

## 2021-10-26 MED ORDER — CIPROFLOXACIN IN D5W 400 MG/200ML IV SOLN
400.0000 mg | INTRAVENOUS | Status: AC
  Administered 2021-10-26: 400 mg via INTRAVENOUS

## 2021-10-26 MED ORDER — DEXAMETHASONE SODIUM PHOSPHATE 10 MG/ML IJ SOLN
INTRAMUSCULAR | Status: DC | PRN
  Administered 2021-10-26: 10 mg via INTRAVENOUS

## 2021-10-26 MED ORDER — CHLORHEXIDINE GLUCONATE 0.12 % MT SOLN: 15.0000 mL | Freq: Once | OROMUCOSAL | Status: AC

## 2021-10-26 MED ORDER — MEPERIDINE HCL 50 MG/ML IJ SOLN: 6.2500 mg | INTRAMUSCULAR | Status: DC | PRN

## 2021-10-26 MED ORDER — BUPIVACAINE LIPOSOME 1.3 % IJ SUSP
INTRAMUSCULAR | Status: DC | PRN
  Administered 2021-10-26: 20 mL

## 2021-10-26 MED ORDER — OXYCODONE HCL 5 MG PO TABS: 5.0000 mg | ORAL_TABLET | ORAL | 0 refills | Status: DC | PRN

## 2021-10-26 MED ORDER — ROCURONIUM BROMIDE 100 MG/10ML IV SOLN
INTRAVENOUS | Status: DC | PRN
  Administered 2021-10-26: 30 mg via INTRAVENOUS
  Administered 2021-10-26: 50 mg via INTRAVENOUS

## 2021-10-26 MED ORDER — LIDOCAINE HCL (CARDIAC) PF 50 MG/5ML IV SOSY
PREFILLED_SYRINGE | INTRAVENOUS | Status: DC | PRN
  Administered 2021-10-26: 80 mg via INTRAVENOUS

## 2021-10-26 MED ORDER — HEMOSTATIC AGENTS (NO CHARGE) OPTIME
TOPICAL | Status: DC | PRN
  Administered 2021-10-26 (×2): 1 via TOPICAL

## 2021-10-26 SURGICAL SUPPLY — 31 items
BAG HAMPER (MISCELLANEOUS) ×1 IMPLANT
CLOTH BEACON ORANGE TIMEOUT ST (SAFETY) ×1 IMPLANT
COVER LIGHT HANDLE STERIS (MISCELLANEOUS) ×2 IMPLANT
COVER MAYO STAND XLG (MISCELLANEOUS) IMPLANT
DISSECTOR SURG LIGASURE 21 (MISCELLANEOUS) ×1 IMPLANT
DRAPE HALF SHEET 40X57 (DRAPES) ×2 IMPLANT
DRSG TEGADERM 4X4.75 (GAUZE/BANDAGES/DRESSINGS) IMPLANT
ELECT REM PT RETURN 9FT ADLT (ELECTROSURGICAL) ×1
ELECTRODE REM PT RTRN 9FT ADLT (ELECTROSURGICAL) ×1 IMPLANT
GAUZE 4X4 16PLY ~~LOC~~+RFID DBL (SPONGE) ×1 IMPLANT
GAUZE SPONGE 4X4 12PLY STRL (GAUZE/BANDAGES/DRESSINGS) ×1 IMPLANT
GLOVE BIO SURGEON STRL SZ 6.5 (GLOVE) ×1 IMPLANT
GLOVE BIOGEL PI IND STRL 6.5 (GLOVE) ×1 IMPLANT
GLOVE BIOGEL PI IND STRL 7.0 (GLOVE) ×2 IMPLANT
GOWN STRL REUS W/TWL LRG LVL3 (GOWN DISPOSABLE) ×2 IMPLANT
HEMOSTAT SURGICEL 4X8 (HEMOSTASIS) ×1 IMPLANT
KIT TURNOVER CYSTO (KITS) ×1 IMPLANT
MANIFOLD NEPTUNE II (INSTRUMENTS) ×1 IMPLANT
NDL HYPO 18GX1.5 BLUNT FILL (NEEDLE) ×1 IMPLANT
NDL HYPO 21X1.5 SAFETY (NEEDLE) ×1 IMPLANT
NEEDLE HYPO 18GX1.5 BLUNT FILL (NEEDLE) ×1 IMPLANT
NEEDLE HYPO 21X1.5 SAFETY (NEEDLE) ×1 IMPLANT
NS IRRIG 1000ML POUR BTL (IV SOLUTION) ×1 IMPLANT
PACK PERI GYN (CUSTOM PROCEDURE TRAY) ×1 IMPLANT
PAD ARMBOARD 7.5X6 YLW CONV (MISCELLANEOUS) ×1 IMPLANT
SET BASIN LINEN APH (SET/KITS/TRAYS/PACK) ×1 IMPLANT
SPONGE SURGIFOAM ABS GEL 100 (HEMOSTASIS) ×1 IMPLANT
SURGILUBE 2OZ TUBE FLIPTOP (MISCELLANEOUS) ×1 IMPLANT
SUT SILK 0 FSL (SUTURE) ×1 IMPLANT
SYR 20ML LL LF (SYRINGE) ×2 IMPLANT
SYR BULB IRRIG 60ML STRL (SYRINGE) IMPLANT

## 2021-10-26 NOTE — Interval H&P Note (Signed)
History and Physical Interval Note:  10/26/2021 11:06 AM  Lawrence Jordan  has presented today for surgery, with the diagnosis of GRADE IV HEMORRHOIDS.  The various methods of treatment have been discussed with the patient and family. After consideration of risks, benefits and other options for treatment, the patient has consented to  Procedure(s): HEMORRHOIDECTOMY (N/A) as a surgical intervention.  The patient's history has been reviewed, patient examined, no change in status, stable for surgery.  I have reviewed the patient's chart and labs.  Questions were answered to the patient's satisfaction.     Virl Cagey

## 2021-10-26 NOTE — Discharge Instructions (Signed)
Discharge Instructions: Please take your roxicodone as prescribed and alternate with tylenol 1000 mg every 4-6 hours.   Do not take any aspirin or NSAIDs, ibuprofen, aleve, BC powder for 5 days.   You can restart your Eliquis on 10/29/2021 if you are not having any bleeding. If you are bleeding call the office and we will discuss when to start the blood thinner back.  Please keep the area clean and dry and take Sitz baths (swallow warm water baths) for comfort and after bowel movements.  You can use Epsom salt in the water if desired.  If you cannot get in a bath tub, you can purchase a Sitz bath that goes on the toilet at the pharmacy.  Please keep your stools soft and take fiber daily (metamucil) and colace (over the counter) to help prevent constipation.  If you have not had a BM in 2 days, please take Miralax, and if you have not had a BM after this, please notify Lawrence Jordan.  Expect some bleeding following the hemorrhoid surgery and significant pain.  Go to the ED with extensive bleeding (soaking 2 large pads in < 1 hour), fevers, or chills.    Shower per your regular routine daily.   Rest and listen to your body, but do not remain in bed all day.  Walk everyday for at least 15-20 minutes. Deep cough and move around every 1-2 hours in the first few days after surgery.  Do not lift > 10 lbs, perform excessive bending, pushing, pulling, squatting. You will have pressure and pain at your anus and this is common. You may want to obtain a donut pillow from your pharmacy to sit on.   Some nausea is common and poor appetite. The main goal is to stay hydrated the first few days after surgery.   Pain Expectations and Narcotics: -After surgery you will have pain associated with your surgery and this is normal. The pain is muscular and nerve pain, and will get better with time. -You are encouraged and expected to take non narcotic medications like tylenol and ibuprofen (when able) to treat pain as  multiple modalities can aid with pain treatment. -Narcotics are only used when pain is severe or there is breakthrough pain. -You are not expected to have a pain score of 0 after surgery, as we cannot prevent pain. A pain score of 3-4 that allows you to be functional, move, walk, and tolerate some activity is the goal. The pain will continue to improve over the days after surgery and is dependent on your surgery. -Due to [place] law, we are only able to give a certain amount of pain medication to treat post operative pain, and we only give additional narcotics on a patient by patient basis.  -For most laparoscopic surgery, studies have shown that the majority of patients only need 10-15 narcotic pills, and for open surgeries or anal surgeries most patients only need 15-20.   -Having appropriate expectations of pain and knowledge of pain management with non narcotics is important as we do not want anyone to become addicted to narcotic pain medication.  -Doing your Sitz baths will help with pain and pressure discomfort.  -Simple acts like meditation and mindfulness practices after surgery can also help with pain control and research has proven the benefit of these practices.  Contact Information: If you have questions or concerns, please call our office, [phone], Monday- Thursday 8AM-5PM and Friday 8AM-12Noon.  If it is after hours or on the weekend,  please call [facility], [phone], [phone], and ask to speak to the surgeon on call for Lawrence Jordan at [facility].

## 2021-10-26 NOTE — Anesthesia Procedure Notes (Signed)
Procedure Name: Intubation Date/Time: 10/26/2021 12:44 PM  Performed by: Vista Deck, CRNAPre-anesthesia Checklist: Patient identified, Patient being monitored, Timeout performed, Emergency Drugs available and Suction available Patient Re-evaluated:Patient Re-evaluated prior to induction Oxygen Delivery Method: Circle system utilized Preoxygenation: Pre-oxygenation with 100% oxygen Induction Type: IV induction Ventilation: Mask ventilation without difficulty Laryngoscope Size: Mac and 3 Grade View: Grade II Tube type: Oral Tube size: 7.5 mm Number of attempts: 1 Airway Equipment and Method: Stylet Placement Confirmation: ETT inserted through vocal cords under direct vision, positive ETCO2 and breath sounds checked- equal and bilateral Secured at: 22 cm Tube secured with: Tape Dental Injury: Teeth and Oropharynx as per pre-operative assessment

## 2021-10-26 NOTE — Anesthesia Preprocedure Evaluation (Addendum)
Anesthesia Evaluation  Patient identified by MRN, date of birth, ID band Patient awake    Reviewed: Allergy & Precautions, NPO status , Patient's Chart, lab work & pertinent test results  Airway Mallampati: II  TM Distance: >3 FB Neck ROM: Full    Dental  (+) Dental Advisory Given, Teeth Intact, Caps   Pulmonary shortness of breath and with exertion, sleep apnea and Continuous Positive Airway Pressure Ventilation ,    Pulmonary exam normal breath sounds clear to auscultation       Cardiovascular Exercise Tolerance: Good hypertension, Pt. on medications + CAD and +CHF  + dysrhythmias Atrial Fibrillation + pacemaker  Rhythm:Regular Rate:Normal + Systolic murmurs 1. Left ventricular ejection fraction, by estimation, is 60 to 65%. The left ventricle has normal function. The left ventricle has no regional wall motion abnormalities. The left ventricular internal cavity size was  mildly dilated. There is mild left ventricular hypertrophy. Left ventricular diastolic parameters were normal.  2. Right ventricular systolic function is normal. The right ventricular size is normal. There is mildly elevated pulmonary artery systolic pressure. The estimated right ventricular systolic pressure is 92.4 mmHg.  3. The mitral valve is grossly normal. Mild mitral valve regurgitation.  4. The aortic valve is tricuspid. Aortic valve regurgitation is mild. Aortic regurgitation PHT measures 1124 msec.  5. Aortic dilatation noted. There is mild dilatation of the aortic root, measuring 40 mm.  6. The inferior vena cava is normal in size with greater than 50% respiratory variability, suggesting right atrial pressure of 3 mmHg.    Neuro/Psych    GI/Hepatic Neg liver ROS, GERD  Medicated and Controlled,  Endo/Other  diabetes, Well Controlled, Type 2, Oral Hypoglycemic AgentsMorbid obesity  Renal/GU negative Renal ROS     Musculoskeletal  (+) Arthritis  , Osteoarthritis,    Abdominal   Peds  Hematology  (+) Blood dyscrasia, anemia ,   Anesthesia Other Findings   Reproductive/Obstetrics                           Anesthesia Physical  Anesthesia Plan  ASA: 3  Anesthesia Plan: General   Post-op Pain Management: Dilaudid IV   Induction: Intravenous  PONV Risk Score and Plan: 3 and Ondansetron and Dexamethasone  Airway Management Planned: Oral ETT  Additional Equipment:   Intra-op Plan:   Post-operative Plan: Extubation in OR  Informed Consent: I have reviewed the patients History and Physical, chart, labs and discussed the procedure including the risks, benefits and alternatives for the proposed anesthesia with the patient or authorized representative who has indicated his/her understanding and acceptance.     Dental advisory given  Plan Discussed with: CRNA and Surgeon  Anesthesia Plan Comments:         Anesthesia Quick Evaluation

## 2021-10-26 NOTE — Op Note (Signed)
[facility] Operative Note  10/26/21  Preoperative Diagnosis: Grade IV hemorrhoids, rectal bleeding    Postoperative Diagnosis: Same   Procedure(s) Performed: Extensive hemorrhoidectomy (internal and external component X 3 columns)    Surgeon: Lanell Matar. Constance Haw, MD   Assistants: No qualified resident was available    Anesthesia: General endotracheal   Anesthesiologist: Denese Killings, MD    Specimens: Right posterior, right anterior, left lateral   Estimated Blood Loss: Minimal   Blood Replacement: None    Complications: None   Wound Class: Contaminated    Operative Indications: Mr. Esquivel is a 71 yo on Eliquis who had recent bleeding and symptomatic anemia from his large grade IV hemorrhoids. We discussed extensive hemorrhoidectomy and risk of bleeding, infection, injury to sphincter complex, anal stenosis from scarring and potential of incontinence due to his lax tone on exam and potential hemorrhoids were preventing incontinence with volume.   Findings: Large extensive hemorrhoid columns right anterior, right posterior and left lateral (posterior left lateral and right anterior running into each other), some hemorrhoidal tissue left due to extent and concern for any potential stenosis    Procedure: The patient was taken to the operating room and placed supine. General endotracheal anesthesia was induced. Intravenous antibiotics were administered per protocol.  He was then placed in lithotomy with all pressure points padded. The anal region and perineal region were prepared and draped in the usual sterile fashion.   An internal exam revealed a lax tone and large hemorrhoids with some evidence of small thrombus inside the hemorrhoid columns. No internal masses noted.   The right anterior and posterior were the largest columns with the left lateral and right posterior running together posteriorly due to the size.    With care an anoscope was inserted and  the large right anterior column was retracted from the sphincter complex with a Debakey and cautery was used to open the skin at the anal verge. A small Ligasure was used to take out the external and internal component of the column, taking care to protect the sphincter complex. A similar thing was done on the right posterior column leaving a sufficient skin bridge between the two columns.    The left lateral and right posterior columns were so extensive they essentially touched and some hemorrhoidal tissue posterior had to be left in order to leave an adequate skin bridge.  The left lateral column was then taken again in the similar fashion stated above using cautery and the Ligasure. The muscle was protected the entire time.  Exparel was injected. Hemostasis was achieved. A gelfoam and Surgicel roll tagged with a 2-0 silk suture and soaked in viscous lidocaine was inserted into the anus.    Final inspection revealed acceptable hemostasis. All counts were correct at the end of the case. The patient was awakened from anesthesia and extubated without complication.  The patient went to the PACU in stable condition.   Curlene Labrum, MD [facility] [address] [place], [place] [address] [phone] (office)

## 2021-10-26 NOTE — Anesthesia Postprocedure Evaluation (Signed)
Anesthesia Post Note  Patient: Lawrence Jordan  Procedure(s) Performed: HEMORRHOIDECTOMY (Rectum)  Patient location during evaluation: PACU Anesthesia Type: General Level of consciousness: awake and alert and oriented Pain management: pain level controlled Vital Signs Assessment: post-procedure vital signs reviewed and stable Respiratory status: spontaneous breathing, nonlabored ventilation and respiratory function stable Cardiovascular status: blood pressure returned to baseline and stable Postop Assessment: no apparent nausea or vomiting Anesthetic complications: no   No notable events documented.   Last Vitals:  Vitals:   10/26/21 1345 10/26/21 1351  BP: (!) 149/81   Pulse: 65 63  Resp: 19 19  Temp:    SpO2: 95% 96%    Last Pain:  Vitals:   10/26/21 1340  TempSrc:   PainSc: 0-No pain                 Thamar Holik C Willey Due

## 2021-10-26 NOTE — Transfer of Care (Signed)
Immediate Anesthesia Transfer of Care Note  Patient: Lawrence Jordan  Procedure(s) Performed: HEMORRHOIDECTOMY (Rectum)  Patient Location: PACU  Anesthesia Type:General  Level of Consciousness: awake, alert  and oriented  Airway & Oxygen Therapy: Patient Spontanous Breathing  Post-op Assessment: Report given to RN and Post -op Vital signs reviewed and stable  Post vital signs: Reviewed and stable  Last Vitals:  Vitals Value Taken Time  BP 149/81 10/26/21 1345  Temp    Pulse 59 10/26/21 1346  Resp 14 10/26/21 1346  SpO2 95 % 10/26/21 1346  Vitals shown include unvalidated device data.  Last Pain:  Vitals:   10/26/21 1110  TempSrc: Oral  PainSc: 0-No pain         Complications: No notable events documented.

## 2021-10-26 NOTE — Progress Notes (Signed)
[facility]  Updated wife. Warned about pain and need for sitz bath. He can purchase one at the pharmacy if unable to get in the tub. Discussed risk of incontinence due to his lax tone and large hemorrhoids. I am not sure how much the hemorrhoids were giving volume to prevent leakage of stool with sneezing, pressure etc.   During this time when he is keeping his stool soft he is highly likely to leak and should wear depends. As he heals will see how much the hemorrhoids were giving some volume control.   Restart Eliquis 10/5 if no bleeding. If bleeding call the office and will decide on a day to restart.   Expect some minor bleeding.   Curlene Labrum, MD [facility] [address] [place], [place] [address] [phone] (office)

## 2021-10-28 LAB — SURGICAL PATHOLOGY

## 2021-10-29 ENCOUNTER — Telehealth: Payer: Self-pay | Admitting: General Surgery

## 2021-10-29 DIAGNOSIS — K643 Fourth degree hemorrhoids: Secondary | ICD-10-CM

## 2021-10-29 MED ORDER — OXYCODONE HCL 5 MG PO TABS: 5.0000 mg | ORAL_TABLET | ORAL | 0 refills | Status: DC | PRN

## 2021-10-29 NOTE — Telephone Encounter (Signed)
Noted  

## 2021-10-29 NOTE — Telephone Encounter (Signed)
[facility]  Talked to wife yesterday as patient was having pain with standing 8-9 but better with sitting. He was cleansing in the shower and not truly sitting on the water but is doing stiz baths now and this is helping.   He tried 2 Roxicodone last night and that helped too. He is having Bms. He did have a low grade fever to 99 but this is better today and she knows if worsening fevers or anything getting close to 101 that he needs to go to ED to be evaluated.  He also had some bilateral feet swelling and I told them to do an extra dose of lasix for the next few days (he does this some when having excess fluid)/  This morning pain was better and overall things improving.  Will send in refill for Roxicodone to be filled tomorrow 10/6 so they will have it over the weekend since he is having to do 2 tabs now.   Curlene Labrum, MD [facility] [address] [place], [place] [address] [phone] (office)

## 2021-10-30 ENCOUNTER — Encounter (HOSPITAL_COMMUNITY): Payer: Self-pay | Admitting: General Surgery

## 2021-11-03 ENCOUNTER — Encounter: Payer: Self-pay | Admitting: Cardiovascular Disease

## 2021-11-04 ENCOUNTER — Encounter: Payer: Self-pay | Admitting: Cardiovascular Disease

## 2021-11-13 ENCOUNTER — Encounter: Payer: Self-pay | Admitting: Cardiovascular Disease

## 2021-11-19 ENCOUNTER — Ambulatory Visit: Payer: Medicare Other | Admitting: Endocrinology

## 2021-11-25 ENCOUNTER — Encounter: Payer: Self-pay | Admitting: General Surgery

## 2021-11-25 ENCOUNTER — Ambulatory Visit (INDEPENDENT_AMBULATORY_CARE_PROVIDER_SITE_OTHER): Payer: Medicare Other | Admitting: General Surgery

## 2021-11-25 VITALS — BP 138/79 | HR 58 | Temp 97.6°F | Resp 16 | Ht 71.0 in | Wt 311.0 lb

## 2021-11-25 DIAGNOSIS — R112 Nausea with vomiting, unspecified: Secondary | ICD-10-CM | POA: Diagnosis not present

## 2021-11-25 DIAGNOSIS — K643 Fourth degree hemorrhoids: Secondary | ICD-10-CM

## 2021-11-25 NOTE — Progress Notes (Signed)
[facility]  Doing well. Says he is sore at times. No bleeding. Says he has regular Bms with the stool softener. Does have some issue with leakage at times but nothing major.   Having a lot of muscle cramps and is worried about his anemia due to eating more ice/ PICA symptoms.   BP 138/79   Pulse (!) 58   Temp 97.6 F (36.4 C) (Oral)   Resp 16   Ht '5\' 11"'$  (1.803 m)   Wt (!) 311 lb (141.1 kg)   SpO2 97%   BMI 43.38 kg/m  Perianal region with minor drainage, no erythema, healing hemorrhoid sites  Patient s/p hemorrhoidectomy. Doing well overall.  Get labs Keep stools regular soft. Call with concerns. Will call with results.   Future Appointments  Date Time Provider [place]  12/01/2021  7:00 AM [facility] DEVICE REMOTES [facility]  12/01/2021  1:30 PM Mealor, Yetta Barre, MD [facility]  01/12/2022 11:45 AM Virl Cagey, MD [facility] None  02/23/2022 10:45 AM Elayne Snare, MD [facility] None  03/02/2022  7:00 AM [facility] DEVICE REMOTES [facility]  06/01/2022  7:00 AM [facility] DEVICE REMOTES [facility]   Curlene Labrum, MD [facility] [address] [place], [place] [address] [phone] (office)

## 2021-11-25 NOTE — Patient Instructions (Signed)
Get labs Keep stools regular soft. Call with concerns. Will call with results.

## 2021-11-26 LAB — BASIC METABOLIC PANEL
BUN: 14 mg/dL (ref 7–25)
CO2: 25 mmol/L (ref 20–32)
Calcium: 9.7 mg/dL (ref 8.6–10.3)
Chloride: 102 mmol/L (ref 98–110)
Creat: 0.81 mg/dL (ref 0.70–1.28)
Glucose, Bld: 90 mg/dL (ref 65–99)
Potassium: 4.4 mmol/L (ref 3.5–5.3)
Sodium: 136 mmol/L (ref 135–146)

## 2021-11-26 LAB — CBC WITH DIFFERENTIAL/PLATELET
Absolute Monocytes: 539 cells/uL (ref 200–950)
Basophils Absolute: 52 cells/uL (ref 0–200)
Basophils Relative: 0.9 %
Eosinophils Absolute: 168 cells/uL (ref 15–500)
Eosinophils Relative: 2.9 %
HCT: 30.2 % — ABNORMAL LOW (ref 38.5–50.0)
Hemoglobin: 9.1 g/dL — ABNORMAL LOW (ref 13.2–17.1)
Lymphs Abs: 2071 cells/uL (ref 850–3900)
MCH: 23.9 pg — ABNORMAL LOW (ref 27.0–33.0)
MCHC: 30.1 g/dL — ABNORMAL LOW (ref 32.0–36.0)
MCV: 79.3 fL — ABNORMAL LOW (ref 80.0–100.0)
MPV: 10.6 fL (ref 7.5–12.5)
Monocytes Relative: 9.3 %
Neutro Abs: 2970 cells/uL (ref 1500–7800)
Neutrophils Relative %: 51.2 %
Platelets: 289 10*3/uL (ref 140–400)
RBC: 3.81 10*6/uL — ABNORMAL LOW (ref 4.20–5.80)
RDW: 18.9 % — ABNORMAL HIGH (ref 11.0–15.0)
Total Lymphocyte: 35.7 %
WBC: 5.8 10*3/uL (ref 3.8–10.8)

## 2021-11-26 LAB — PHOSPHORUS: Phosphorus: 4.2 mg/dL (ref 2.1–4.3)

## 2021-11-26 LAB — MAGNESIUM: Magnesium: 1.7 mg/dL (ref 1.5–2.5)

## 2021-11-26 NOTE — Progress Notes (Signed)
Let Lawrence Jordan know his electrolytes all look good, Anemia is stable at 9. He will probably need iron infusions again I am guessing. I think he was getting them with Hematology? Can we find out and get him referred back for the iron. Any oral iron could constipate him.

## 2021-11-27 ENCOUNTER — Telehealth: Payer: Self-pay | Admitting: Physician Assistant

## 2021-11-27 ENCOUNTER — Encounter: Payer: Medicare Other | Admitting: Internal Medicine

## 2021-11-27 NOTE — Telephone Encounter (Signed)
Called patient regarding upcoming appointment, patient is notified. °
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
## 2021-12-01 ENCOUNTER — Ambulatory Visit: Payer: Medicare Other | Admitting: Physician Assistant

## 2021-12-01 ENCOUNTER — Ambulatory Visit (INDEPENDENT_AMBULATORY_CARE_PROVIDER_SITE_OTHER): Payer: Medicare Other | Admitting: Cardiovascular Disease

## 2021-12-01 ENCOUNTER — Encounter: Payer: Self-pay | Admitting: Cardiovascular Disease

## 2021-12-01 ENCOUNTER — Inpatient Hospital Stay: Payer: Medicare Other

## 2021-12-01 ENCOUNTER — Inpatient Hospital Stay: Payer: Medicare Other | Attending: Physician Assistant | Admitting: Physician Assistant

## 2021-12-01 ENCOUNTER — Other Ambulatory Visit: Payer: Medicare Other

## 2021-12-01 ENCOUNTER — Ambulatory Visit (INDEPENDENT_AMBULATORY_CARE_PROVIDER_SITE_OTHER): Payer: Medicare Other

## 2021-12-01 VITALS — BP 120/80 | HR 58 | Ht 71.0 in | Wt 307.0 lb

## 2021-12-01 VITALS — BP 133/70 | HR 56 | Temp 98.3°F | Resp 20 | Ht 71.0 in | Wt 307.0 lb

## 2021-12-01 DIAGNOSIS — Z950 Presence of cardiac pacemaker: Secondary | ICD-10-CM | POA: Diagnosis not present

## 2021-12-01 DIAGNOSIS — I110 Hypertensive heart disease with heart failure: Secondary | ICD-10-CM | POA: Insufficient documentation

## 2021-12-01 DIAGNOSIS — Z7901 Long term (current) use of anticoagulants: Secondary | ICD-10-CM | POA: Insufficient documentation

## 2021-12-01 DIAGNOSIS — D509 Iron deficiency anemia, unspecified: Secondary | ICD-10-CM

## 2021-12-01 DIAGNOSIS — I495 Sick sinus syndrome: Secondary | ICD-10-CM

## 2021-12-01 DIAGNOSIS — I4819 Other persistent atrial fibrillation: Secondary | ICD-10-CM | POA: Insufficient documentation

## 2021-12-01 DIAGNOSIS — Z79899 Other long term (current) drug therapy: Secondary | ICD-10-CM | POA: Diagnosis not present

## 2021-12-01 DIAGNOSIS — I2510 Atherosclerotic heart disease of native coronary artery without angina pectoris: Secondary | ICD-10-CM

## 2021-12-01 DIAGNOSIS — D500 Iron deficiency anemia secondary to blood loss (chronic): Secondary | ICD-10-CM | POA: Diagnosis not present

## 2021-12-01 LAB — CUP PACEART REMOTE DEVICE CHECK
Battery Remaining Longevity: 106 mo
Battery Remaining Percentage: 93 %
Battery Voltage: 3.02 V
Brady Statistic AP VP Percent: 1.2 %
Brady Statistic AP VS Percent: 74 %
Brady Statistic AS VP Percent: 1.1 %
Brady Statistic AS VS Percent: 22 %
Brady Statistic RA Percent Paced: 71 %
Brady Statistic RV Percent Paced: 2.2 %
Date Time Interrogation Session: 20231107033338
Implantable Lead Connection Status: 753985
Implantable Lead Connection Status: 753985
Implantable Lead Implant Date: 20221108
Implantable Lead Implant Date: 20221108
Implantable Lead Location: 753859
Implantable Lead Location: 753860
Implantable Pulse Generator Implant Date: 20221108
Lead Channel Impedance Value: 440 Ohm
Lead Channel Impedance Value: 450 Ohm
Lead Channel Pacing Threshold Amplitude: 0.75 V
Lead Channel Pacing Threshold Amplitude: 0.75 V
Lead Channel Pacing Threshold Pulse Width: 0.5 ms
Lead Channel Pacing Threshold Pulse Width: 0.5 ms
Lead Channel Sensing Intrinsic Amplitude: 12 mV
Lead Channel Sensing Intrinsic Amplitude: 5 mV
Lead Channel Setting Pacing Amplitude: 2 V
Lead Channel Setting Pacing Amplitude: 2.5 V
Lead Channel Setting Pacing Pulse Width: 0.5 ms
Lead Channel Setting Sensing Sensitivity: 2 mV
Pulse Gen Model: 2272
Pulse Gen Serial Number: 3968031

## 2021-12-01 LAB — CBC WITH DIFFERENTIAL (CANCER CENTER ONLY)
Abs Immature Granulocytes: 0 10*3/uL (ref 0.00–0.07)
Basophils Absolute: 0 10*3/uL (ref 0.0–0.1)
Basophils Relative: 1 %
Eosinophils Absolute: 0.2 10*3/uL (ref 0.0–0.5)
Eosinophils Relative: 3 %
HCT: 29.7 % — ABNORMAL LOW (ref 39.0–52.0)
Hemoglobin: 8.9 g/dL — ABNORMAL LOW (ref 13.0–17.0)
Immature Granulocytes: 0 %
Lymphocytes Relative: 31 %
Lymphs Abs: 1.6 10*3/uL (ref 0.7–4.0)
MCH: 23.1 pg — ABNORMAL LOW (ref 26.0–34.0)
MCHC: 30 g/dL (ref 30.0–36.0)
MCV: 77.1 fL — ABNORMAL LOW (ref 80.0–100.0)
Monocytes Absolute: 0.5 10*3/uL (ref 0.1–1.0)
Monocytes Relative: 9 %
Neutro Abs: 2.9 10*3/uL (ref 1.7–7.7)
Neutrophils Relative %: 56 %
Platelet Count: 279 10*3/uL (ref 150–400)
RBC: 3.85 MIL/uL — ABNORMAL LOW (ref 4.22–5.81)
RDW: 18.8 % — ABNORMAL HIGH (ref 11.5–15.5)
WBC Count: 5.2 10*3/uL (ref 4.0–10.5)
nRBC: 0 % (ref 0.0–0.2)

## 2021-12-01 LAB — IRON AND IRON BINDING CAPACITY (CC-WL,HP ONLY)
Iron: 14 ug/dL — ABNORMAL LOW (ref 45–182)
Saturation Ratios: 3 % — ABNORMAL LOW (ref 17.9–39.5)
TIBC: 559 ug/dL — ABNORMAL HIGH (ref 250–450)
UIBC: 545 ug/dL — ABNORMAL HIGH (ref 117–376)

## 2021-12-01 LAB — FERRITIN: Ferritin: 3 ng/mL — ABNORMAL LOW (ref 24–336)
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
## 2021-12-01 NOTE — Patient Instructions (Signed)
Medication Instructions:  Continue all current medications.  Labwork: none  Testing/Procedures: none  Follow-Up: 6 months   Any Other Special Instructions Will Be Listed Below (If Applicable). You have been referred to Dr. Quentin Ore for [facility] evaluation.    If you need a refill on your cardiac medications before your next appointment, please call your pharmacy.

## 2021-12-03 ENCOUNTER — Telehealth: Payer: Self-pay | Admitting: Pharmacy Technician

## 2021-12-03 ENCOUNTER — Other Ambulatory Visit: Payer: Self-pay | Admitting: Pharmacy Technician

## 2021-12-03 NOTE — Telephone Encounter (Signed)
Auth Submission: NO AUTH NEEDED Payer: MEDICARE A/B Medication & CPT/J Code(s) submitted: Venofer (Iron Sucrose) [id number] Route of submission (phone, fax, portal):  Phone # Fax # Auth type: Buy/Bill Units/visits requested: X3 Reference number:  Approval from: 12/03/21 to 01/24/22   Patient will be scheduled as soon as possible.

## 2021-12-04 ENCOUNTER — Other Ambulatory Visit: Payer: Self-pay | Admitting: Pharmacy Technician

## 2021-12-05 DIAGNOSIS — Z6841 Body Mass Index (BMI) 40.0 and over, adult: Secondary | ICD-10-CM | POA: Diagnosis not present

## 2021-12-05 DIAGNOSIS — B3789 Other sites of candidiasis: Secondary | ICD-10-CM | POA: Diagnosis not present

## 2021-12-05 DIAGNOSIS — E1165 Type 2 diabetes mellitus with hyperglycemia: Secondary | ICD-10-CM | POA: Diagnosis not present

## 2021-12-05 DIAGNOSIS — R030 Elevated blood-pressure reading, without diagnosis of hypertension: Secondary | ICD-10-CM | POA: Diagnosis not present

## 2021-12-05 DIAGNOSIS — R300 Dysuria: Secondary | ICD-10-CM | POA: Diagnosis not present

## 2021-12-05 DIAGNOSIS — I482 Chronic atrial fibrillation, unspecified: Secondary | ICD-10-CM | POA: Diagnosis not present

## 2021-12-05 DIAGNOSIS — Z8719 Personal history of other diseases of the digestive system: Secondary | ICD-10-CM | POA: Diagnosis not present

## 2021-12-05 DIAGNOSIS — Z950 Presence of cardiac pacemaker: Secondary | ICD-10-CM | POA: Diagnosis not present

## 2021-12-05 DIAGNOSIS — R001 Bradycardia, unspecified: Secondary | ICD-10-CM | POA: Diagnosis not present

## 2021-12-05 DIAGNOSIS — Z9889 Other specified postprocedural states: Secondary | ICD-10-CM | POA: Diagnosis not present

## 2021-12-08 ENCOUNTER — Ambulatory Visit (INDEPENDENT_AMBULATORY_CARE_PROVIDER_SITE_OTHER): Payer: Medicare Other

## 2021-12-08 VITALS — BP 132/78 | HR 65 | Temp 98.0°F | Resp 20 | Ht 71.0 in | Wt 313.2 lb

## 2021-12-08 DIAGNOSIS — D500 Iron deficiency anemia secondary to blood loss (chronic): Secondary | ICD-10-CM

## 2021-12-08 MED ORDER — SODIUM CHLORIDE 0.9 % IV SOLN
300.0000 mg | Freq: Once | INTRAVENOUS | Status: AC
  Administered 2021-12-08: 300 mg via INTRAVENOUS
  Filled 2021-12-08: qty 15

## 2021-12-08 MED ORDER — ACETAMINOPHEN 325 MG PO TABS
650.0000 mg | ORAL_TABLET | Freq: Once | ORAL | Status: AC
  Administered 2021-12-08: 650 mg via ORAL
  Filled 2021-12-08: qty 2

## 2021-12-08 MED ORDER — DIPHENHYDRAMINE HCL 25 MG PO CAPS
25.0000 mg | ORAL_CAPSULE | Freq: Once | ORAL | Status: AC
  Administered 2021-12-08: 25 mg via ORAL
  Filled 2021-12-08: qty 1

## 2021-12-08 NOTE — Progress Notes (Signed)
Diagnosis: Iron Deficiency Anemia  Provider:  Marshell Garfinkel MD  Procedure: Infusion  IV Type: Peripheral, IV Location: R Hand  Venofer (Iron Sucrose), Dose: 300 mg  Infusion Start Time: 0940  Infusion Stop Time: 1527  Post Infusion IV Care: Observation period completed and Peripheral IV Discontinued  Discharge: Condition: Good, Destination: Home . AVS provided to patient.   Performed by:  Arnoldo Morale, RN

## 2021-12-15 ENCOUNTER — Ambulatory Visit (INDEPENDENT_AMBULATORY_CARE_PROVIDER_SITE_OTHER): Payer: Medicare Other

## 2021-12-15 VITALS — BP 126/72 | HR 63 | Temp 98.4°F | Resp 18 | Ht 71.0 in | Wt 311.2 lb

## 2021-12-15 DIAGNOSIS — D500 Iron deficiency anemia secondary to blood loss (chronic): Secondary | ICD-10-CM | POA: Diagnosis not present

## 2021-12-15 MED ORDER — SODIUM CHLORIDE 0.9 % IV SOLN
300.0000 mg | Freq: Once | INTRAVENOUS | Status: AC
  Administered 2021-12-15: 300 mg via INTRAVENOUS
  Filled 2021-12-15: qty 15

## 2021-12-15 MED ORDER — ACETAMINOPHEN 325 MG PO TABS
650.0000 mg | ORAL_TABLET | Freq: Once | ORAL | Status: AC
  Administered 2021-12-15: 650 mg via ORAL
  Filled 2021-12-15: qty 2

## 2021-12-15 MED ORDER — DIPHENHYDRAMINE HCL 25 MG PO CAPS
25.0000 mg | ORAL_CAPSULE | Freq: Once | ORAL | Status: AC
  Administered 2021-12-15: 25 mg via ORAL
  Filled 2021-12-15: qty 1

## 2021-12-15 NOTE — Progress Notes (Signed)
Diagnosis: Infusion  Provider:  Marshell Garfinkel MD  Procedure: Infusion  IV Type: Peripheral, IV Location: R Hand  Venofer (Iron Sucrose), Dose: 300 mg  Infusion Start Time: 4799  Infusion Stop Time: 8721  Post Infusion IV Care: Peripheral IV Discontinued  Discharge: Condition: Good, Destination: Home . AVS provided to patient.   Performed by:  Adelina Mings, LPN

## 2021-12-16 ENCOUNTER — Other Ambulatory Visit: Payer: Self-pay | Admitting: Endocrinology

## 2021-12-16 DIAGNOSIS — E1165 Type 2 diabetes mellitus with hyperglycemia: Secondary | ICD-10-CM

## 2021-12-21 ENCOUNTER — Telehealth: Payer: Self-pay

## 2021-12-21 NOTE — Telephone Encounter (Signed)
CV Remote Solution Alert Received.   Pacemaker Alert transmission received for High V. Rates during AT/AF, High Ventricular Rate detected. 17 AMS episodes recorded.  Burden 1.0%.  Longest duration 1 hour 3 minutes 32 seconds.  On [place] per Epic. 1 HVR episode of 4 minutes 56 seconds duration.  EGM shows AF with 1:1 conduction at 203 bpm. Presenting EGM shows ongoing AF with average V rate 128 bpm. To Triage.  Attempted to call patient to send manual transmission and review s/s. No answer, LMTCB.

## 2021-12-22 ENCOUNTER — Ambulatory Visit (INDEPENDENT_AMBULATORY_CARE_PROVIDER_SITE_OTHER): Payer: Medicare Other

## 2021-12-22 VITALS — BP 140/77 | HR 60 | Temp 98.5°F | Resp 20 | Ht 71.0 in | Wt 315.4 lb

## 2021-12-22 DIAGNOSIS — D500 Iron deficiency anemia secondary to blood loss (chronic): Secondary | ICD-10-CM

## 2021-12-22 MED ORDER — ACETAMINOPHEN 325 MG PO TABS
650.0000 mg | ORAL_TABLET | Freq: Once | ORAL | Status: AC
  Administered 2021-12-22: 650 mg via ORAL
  Filled 2021-12-22: qty 2

## 2021-12-22 MED ORDER — SODIUM CHLORIDE 0.9 % IV SOLN
300.0000 mg | Freq: Once | INTRAVENOUS | Status: AC
  Administered 2021-12-22: 300 mg via INTRAVENOUS
  Filled 2021-12-22: qty 15

## 2021-12-22 MED ORDER — DIPHENHYDRAMINE HCL 25 MG PO CAPS
25.0000 mg | ORAL_CAPSULE | Freq: Once | ORAL | Status: AC
  Administered 2021-12-22: 25 mg via ORAL
  Filled 2021-12-22: qty 1

## 2021-12-22 NOTE — Progress Notes (Signed)
Diagnosis: Iron Deficiency Anemia  Provider:  Marshell Garfinkel MD  Procedure: Infusion  IV Type: Peripheral, IV Location: R Hand  Venofer (Iron Sucrose), Dose: 300 mg  Infusion Start Time: 1100  Infusion Stop Time: 3496  Post Infusion IV Care: Peripheral IV Discontinued  Discharge: Condition: Good, Destination: Home . AVS provided to patient.   Performed by:  Adelina Mings, LPN

## 2021-12-23 NOTE — Telephone Encounter (Signed)
Patient reports of increased fatigue and shortness of breath with ambulation for several months. States he saw Dr. Myles Gip in [place] a few weeks ago and has consulted with Dr. Quentin Ore for [place] [facility]. Reports he had his third iron infusion yesterday with no changes in symptoms. Requested patient send manual transmission for updated information. Patient is now back in [place] with controlled rates. Advised patient I will forward to Dr. Myles Gip since rates were elevated. Patient voiced understanding. Patient aware to call if any questions or concerns.

## 2021-12-24 DIAGNOSIS — H401213 Low-tension glaucoma, right eye, severe stage: Secondary | ICD-10-CM | POA: Diagnosis not present

## 2021-12-24 NOTE — Telephone Encounter (Signed)
CV Remote Solution alert received for "Nonbillable remote reviewed. Normal device function.  There were three atrial arrhythmias detected,, on [place], AF burden is 1% of the time. There was one long ventricular high rate detected that may be a dualing arrhythmia from the atrium and the ventricle, this lasted 6 minutes and 42 seconds."  Routing to Dr. Myles Gip as update alert was noted.

## 2021-12-25 NOTE — Progress Notes (Signed)
Remote pacemaker transmission.   

## 2021-12-28 MED ORDER — METOPROLOL TARTRATE 25 MG PO TABS: 25.0000 mg | ORAL_TABLET | Freq: Two times a day (BID) | ORAL | Status: DC

## 2021-12-28 NOTE — Telephone Encounter (Signed)
Pt was returning nurse call. His phone number is [phone].

## 2021-12-28 NOTE — Telephone Encounter (Signed)
Order obtained from Dr. Myles Gip to change Metoprolol tartrate to 25 mg BID. Patient called to advise. Script sent to pharmacy.

## 2021-12-28 NOTE — Telephone Encounter (Signed)
Attempted to contact patient to advise Dr. Karel Jarvis recommendation. No answer, LMTCB.

## 2022-01-12 ENCOUNTER — Encounter: Payer: Self-pay | Admitting: General Surgery

## 2022-01-12 ENCOUNTER — Ambulatory Visit (INDEPENDENT_AMBULATORY_CARE_PROVIDER_SITE_OTHER): Payer: Medicare Other | Admitting: General Surgery

## 2022-01-12 VITALS — BP 119/80 | HR 67 | Temp 98.5°F | Resp 18 | Ht 71.0 in | Wt 315.0 lb

## 2022-01-12 DIAGNOSIS — Z09 Encounter for follow-up examination after completed treatment for conditions other than malignant neoplasm: Secondary | ICD-10-CM

## 2022-01-12 DIAGNOSIS — K643 Fourth degree hemorrhoids: Secondary | ICD-10-CM

## 2022-01-12 NOTE — Patient Instructions (Signed)
Will talk to GI about Linzess and constipation. Activity as tolerated. Diet as tolerated.  Keep stools regular and soft.

## 2022-01-13 NOTE — Progress Notes (Signed)
[facility]  Having some minor bleeding at times. Having constipation at baseline and using water, fiber and stool softeners. He has never talked to GI about options for his constipation like Linzess.  He is otherwise doing well. Hygiene is much easier. No pain. He feels weak at times and is eating ice still with his anemia. He is due to see Hematology again in January. He has been getting iron infusions.   BP 119/80   Pulse 67   Temp 98.5 F (36.9 C) (Oral)   Resp 18   Ht '5\' 11"'$  (1.803 m)   Wt (!) 315 lb (142.9 kg)   SpO2 97%   BMI 43.93 kg/m  Hemorrhoid incisions healed.   Patient s/p Extensive hemorrhoidectomy. Doing well.  Follow up with hematology about the anemia and iron infusions, they will repeat labs when they are ready Continue to try to keep stools soft and regular I will talk to GI about Linzess and constipation. Activity as tolerated. Diet as tolerated.  PRN Follow up  Curlene Labrum, MD [facility] [address] [place], [place] [address] [phone] (office)

## 2022-01-29 ENCOUNTER — Telehealth: Payer: Self-pay | Admitting: Internal Medicine

## 2022-01-29 DIAGNOSIS — R030 Elevated blood-pressure reading, without diagnosis of hypertension: Secondary | ICD-10-CM | POA: Diagnosis not present

## 2022-01-29 DIAGNOSIS — Z6841 Body Mass Index (BMI) 40.0 and over, adult: Secondary | ICD-10-CM | POA: Diagnosis not present

## 2022-01-29 DIAGNOSIS — J0101 Acute recurrent maxillary sinusitis: Secondary | ICD-10-CM | POA: Diagnosis not present

## 2022-01-29 DIAGNOSIS — J101 Influenza due to other identified influenza virus with other respiratory manifestations: Secondary | ICD-10-CM | POA: Diagnosis not present

## 2022-01-29 DIAGNOSIS — Z20828 Contact with and (suspected) exposure to other viral communicable diseases: Secondary | ICD-10-CM | POA: Diagnosis not present

## 2022-01-29 DIAGNOSIS — M545 Low back pain, unspecified: Secondary | ICD-10-CM | POA: Diagnosis not present

## 2022-01-29 NOTE — Telephone Encounter (Signed)
Called patient regarding upcoming January appointments, left a voicemail. 

## 2022-02-02 ENCOUNTER — Other Ambulatory Visit: Payer: Self-pay

## 2022-02-02 ENCOUNTER — Inpatient Hospital Stay: Payer: Medicare Other

## 2022-02-02 ENCOUNTER — Other Ambulatory Visit: Payer: Self-pay | Admitting: Internal Medicine

## 2022-02-02 ENCOUNTER — Inpatient Hospital Stay: Payer: Medicare Other | Attending: Physician Assistant | Admitting: Internal Medicine

## 2022-02-02 VITALS — BP 148/77 | HR 68 | Temp 98.0°F | Resp 18 | Wt 315.3 lb

## 2022-02-02 DIAGNOSIS — Q2730 Arteriovenous malformation, site unspecified: Secondary | ICD-10-CM | POA: Insufficient documentation

## 2022-02-02 DIAGNOSIS — D509 Iron deficiency anemia, unspecified: Secondary | ICD-10-CM

## 2022-02-02 DIAGNOSIS — Z79899 Other long term (current) drug therapy: Secondary | ICD-10-CM | POA: Insufficient documentation

## 2022-02-02 DIAGNOSIS — D500 Iron deficiency anemia secondary to blood loss (chronic): Secondary | ICD-10-CM | POA: Diagnosis not present

## 2022-02-02 LAB — CBC WITH DIFFERENTIAL (CANCER CENTER ONLY)
Abs Immature Granulocytes: 0.02 10*3/uL (ref 0.00–0.07)
Basophils Absolute: 0.1 10*3/uL (ref 0.0–0.1)
Basophils Relative: 1 %
Eosinophils Absolute: 0.1 10*3/uL (ref 0.0–0.5)
Eosinophils Relative: 1 %
HCT: 38.6 % — ABNORMAL LOW (ref 39.0–52.0)
Hemoglobin: 11.7 g/dL — ABNORMAL LOW (ref 13.0–17.0)
Immature Granulocytes: 0 %
Lymphocytes Relative: 28 %
Lymphs Abs: 2 10*3/uL (ref 0.7–4.0)
MCH: 24 pg — ABNORMAL LOW (ref 26.0–34.0)
MCHC: 30.3 g/dL (ref 30.0–36.0)
MCV: 79.1 fL — ABNORMAL LOW (ref 80.0–100.0)
Monocytes Absolute: 0.5 10*3/uL (ref 0.1–1.0)
Monocytes Relative: 7 %
Neutro Abs: 4.6 10*3/uL (ref 1.7–7.7)
Neutrophils Relative %: 63 %
Platelet Count: 293 10*3/uL (ref 150–400)
RBC: 4.88 MIL/uL (ref 4.22–5.81)
RDW: 22.5 % — ABNORMAL HIGH (ref 11.5–15.5)
WBC Count: 7.3 10*3/uL (ref 4.0–10.5)
nRBC: 0 % (ref 0.0–0.2)

## 2022-02-02 LAB — FERRITIN: Ferritin: 8 ng/mL — ABNORMAL LOW (ref 24–336)

## 2022-02-02 LAB — IRON AND IRON BINDING CAPACITY (CC-WL,HP ONLY)
Iron: 33 ug/dL — ABNORMAL LOW (ref 45–182)
Saturation Ratios: 6 % — ABNORMAL LOW (ref 17.9–39.5)
TIBC: 524 ug/dL — ABNORMAL HIGH (ref 250–450)
UIBC: 491 ug/dL — ABNORMAL HIGH (ref 117–376)

## 2022-02-02 LAB — SAMPLE TO BLOOD BANK
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
## 2022-02-08 ENCOUNTER — Encounter: Payer: Self-pay | Admitting: Internal Medicine

## 2022-02-08 ENCOUNTER — Encounter: Payer: Self-pay | Admitting: Cardiology

## 2022-02-08 ENCOUNTER — Ambulatory Visit: Payer: Medicare Other | Attending: Cardiology | Admitting: Cardiology

## 2022-02-08 VITALS — BP 140/80 | HR 70 | Ht 71.0 in | Wt 318.0 lb

## 2022-02-08 DIAGNOSIS — I484 Atypical atrial flutter: Secondary | ICD-10-CM

## 2022-02-08 DIAGNOSIS — K649 Unspecified hemorrhoids: Secondary | ICD-10-CM | POA: Diagnosis not present

## 2022-02-08 DIAGNOSIS — I4819 Other persistent atrial fibrillation: Secondary | ICD-10-CM

## 2022-02-08 NOTE — Patient Instructions (Signed)
Medication Instructions:  Your physician recommends that you continue on your current medications as directed. Please refer to the Current Medication list given to you today.  *If you need a refill on your cardiac medications before your next appointment, please call your pharmacy*   Lab Work: None ordered   Testing/Procedures: None ordered   Follow-Up: At [facility], you and your health needs are our priority.  As part of our continuing mission to provide you with exceptional heart care, we have created designated Provider Care Teams.  These Care Teams include your primary Cardiologist (physician) and Advanced Practice Providers (APPs -  Physician Assistants and Nurse Practitioners) who all work together to provide you with the care you need, when you need it.  Your next appointment:   as  needed  The format for your next appointment:   In Person  Provider:   Lars Mage, MD    Thank you for choosing [facility]!!    [phone]  Other Instructions
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
## 2022-02-09 ENCOUNTER — Other Ambulatory Visit: Payer: Self-pay | Admitting: Physician Assistant

## 2022-02-09 ENCOUNTER — Encounter: Payer: Self-pay | Admitting: Medical Oncology

## 2022-02-09 LAB — CUP PACEART INCLINIC DEVICE CHECK
Battery Remaining Longevity: 105 mo
Battery Voltage: 3.01 V
Brady Statistic RA Percent Paced: 88 %
Brady Statistic RV Percent Paced: 2.5 %
Date Time Interrogation Session: 20240115154144
Implantable Lead Connection Status: 753985
Implantable Lead Connection Status: 753985
Implantable Lead Implant Date: 20221108
Implantable Lead Implant Date: 20221108
Implantable Lead Location: 753859
Implantable Lead Location: 753860
Implantable Pulse Generator Implant Date: 20221108
Lead Channel Impedance Value: 475 Ohm
Lead Channel Impedance Value: 475 Ohm
Lead Channel Pacing Threshold Amplitude: 0.5 V
Lead Channel Pacing Threshold Amplitude: 0.5 V
Lead Channel Pacing Threshold Amplitude: 0.75 V
Lead Channel Pacing Threshold Amplitude: 0.75 V
Lead Channel Pacing Threshold Pulse Width: 0.5 ms
Lead Channel Pacing Threshold Pulse Width: 0.5 ms
Lead Channel Pacing Threshold Pulse Width: 0.5 ms
Lead Channel Pacing Threshold Pulse Width: 0.5 ms
Lead Channel Sensing Intrinsic Amplitude: 12 mV
Lead Channel Sensing Intrinsic Amplitude: 5 mV
Lead Channel Setting Pacing Amplitude: 2 V
Lead Channel Setting Pacing Amplitude: 2.5 V
Lead Channel Setting Pacing Pulse Width: 0.5 ms
Lead Channel Setting Sensing Sensitivity: 2 mV
Pulse Gen Model: 2272
Pulse Gen Serial Number: 3968031

## 2022-02-10 ENCOUNTER — Other Ambulatory Visit: Payer: Self-pay | Admitting: Physician Assistant

## 2022-02-10 ENCOUNTER — Telehealth: Payer: Self-pay | Admitting: Pharmacy Technician

## 2022-02-10 ENCOUNTER — Other Ambulatory Visit: Payer: Self-pay | Admitting: Pharmacy Technician

## 2022-02-10 NOTE — Telephone Encounter (Signed)
Lawrence Jordan,  Medicare will not cover Venofer with DX code D50.0. They will only cover if patient has CKD.  Preferred medication is Feraheme.  Would you like to use Feraheme?

## 2022-02-16 ENCOUNTER — Ambulatory Visit (INDEPENDENT_AMBULATORY_CARE_PROVIDER_SITE_OTHER): Payer: Medicare Other

## 2022-02-16 VITALS — BP 148/86 | HR 68 | Temp 97.7°F | Resp 18 | Ht 71.0 in | Wt 325.6 lb

## 2022-02-16 DIAGNOSIS — K922 Gastrointestinal hemorrhage, unspecified: Secondary | ICD-10-CM | POA: Diagnosis not present

## 2022-02-16 DIAGNOSIS — D500 Iron deficiency anemia secondary to blood loss (chronic): Secondary | ICD-10-CM | POA: Diagnosis not present

## 2022-02-16 MED ORDER — DIPHENHYDRAMINE HCL 25 MG PO CAPS
25.0000 mg | ORAL_CAPSULE | Freq: Once | ORAL | Status: AC
  Administered 2022-02-16: 25 mg via ORAL
  Filled 2022-02-16: qty 1

## 2022-02-16 MED ORDER — ACETAMINOPHEN 325 MG PO TABS
650.0000 mg | ORAL_TABLET | Freq: Once | ORAL | Status: AC
  Administered 2022-02-16: 650 mg via ORAL
  Filled 2022-02-16: qty 2

## 2022-02-16 MED ORDER — SODIUM CHLORIDE 0.9 % IV SOLN
510.0000 mg | Freq: Once | INTRAVENOUS | Status: AC
  Administered 2022-02-16: 510 mg via INTRAVENOUS
  Filled 2022-02-16: qty 17

## 2022-02-16 NOTE — Progress Notes (Signed)
Diagnosis: Iron Deficiency Anemia  Provider:  Marshell Garfinkel MD  Procedure: Infusion  IV Type: Peripheral, IV Location: L Hand  Feraheme (Ferumoxytol), Dose: 510 mg  Infusion Start Time: 4235  Infusion Stop Time: 1430  Post Infusion IV Care: Observation period completed and Peripheral IV Discontinued  Discharge: Condition: Good, Destination: Home . AVS provided to patient.   Performed by:  Cleophus Molt, RN

## 2022-02-18 DIAGNOSIS — H401222 Low-tension glaucoma, left eye, moderate stage: Secondary | ICD-10-CM | POA: Diagnosis not present

## 2022-02-23 ENCOUNTER — Ambulatory Visit (INDEPENDENT_AMBULATORY_CARE_PROVIDER_SITE_OTHER): Payer: Medicare Other | Admitting: Endocrinology

## 2022-02-23 ENCOUNTER — Ambulatory Visit (INDEPENDENT_AMBULATORY_CARE_PROVIDER_SITE_OTHER): Payer: Medicare Other

## 2022-02-23 VITALS — BP 147/94 | HR 66 | Temp 98.1°F | Resp 18 | Ht 71.0 in | Wt 322.4 lb

## 2022-02-23 VITALS — BP 144/86 | HR 68 | Ht 71.0 in | Wt 324.4 lb

## 2022-02-23 DIAGNOSIS — Z794 Long term (current) use of insulin: Secondary | ICD-10-CM | POA: Diagnosis not present

## 2022-02-23 DIAGNOSIS — E1165 Type 2 diabetes mellitus with hyperglycemia: Secondary | ICD-10-CM

## 2022-02-23 DIAGNOSIS — D500 Iron deficiency anemia secondary to blood loss (chronic): Secondary | ICD-10-CM | POA: Diagnosis not present

## 2022-02-23 LAB — COMPREHENSIVE METABOLIC PANEL
ALT: 40 U/L (ref 0–53)
AST: 29 U/L (ref 0–37)
Albumin: 4.6 g/dL (ref 3.5–5.2)
Alkaline Phosphatase: 61 U/L (ref 39–117)
BUN: 14 mg/dL (ref 6–23)
CO2: 23 mEq/L (ref 19–32)
Calcium: 10.3 mg/dL (ref 8.4–10.5)
Chloride: 101 mEq/L (ref 96–112)
Creatinine, Ser: 0.75 mg/dL (ref 0.40–1.50)
GFR: 90.99 mL/min (ref >60.00)
Glucose, Bld: 214 mg/dL — ABNORMAL HIGH (ref 70–99)
Potassium: 4.1 mEq/L (ref 3.5–5.1)
Sodium: 137 mEq/L (ref 135–145)
Total Bilirubin: 0.3 mg/dL (ref 0.2–1.2)
Total Protein: 7.9 g/dL (ref 6.0–8.3)

## 2022-02-23 LAB — POCT GLYCOSYLATED HEMOGLOBIN (HGB A1C): Hemoglobin A1C: 6.5 % — AB (ref 4.0–5.6)

## 2022-02-23 MED ORDER — ACETAMINOPHEN 325 MG PO TABS
650.0000 mg | ORAL_TABLET | Freq: Once | ORAL | Status: AC
  Administered 2022-02-23: 650 mg via ORAL
  Filled 2022-02-23: qty 2

## 2022-02-23 MED ORDER — SODIUM CHLORIDE 0.9 % IV SOLN
510.0000 mg | Freq: Once | INTRAVENOUS | Status: AC
  Administered 2022-02-23: 510 mg via INTRAVENOUS
  Filled 2022-02-23: qty 17

## 2022-02-23 MED ORDER — DIPHENHYDRAMINE HCL 25 MG PO CAPS
25.0000 mg | ORAL_CAPSULE | Freq: Once | ORAL | Status: AC
  Administered 2022-02-23: 25 mg via ORAL
  Filled 2022-02-23: qty 1

## 2022-02-23 NOTE — Progress Notes (Signed)
Diagnosis: Iron Deficiency Anemia  Provider:  Marshell Garfinkel MD  Procedure: Infusion  IV Type: Peripheral, IV Location: L Hand  Feraheme (Ferumoxytol), Dose: 510 mg  Infusion Start Time: 2297  Infusion Stop Time: 9892  Post Infusion IV Care: Peripheral IV Discontinued  Discharge: Condition: Good, Destination: Home . AVS provided to patient.   Performed by:  Cleophus Molt, RN
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
## 2022-02-24 ENCOUNTER — Other Ambulatory Visit (HOSPITAL_COMMUNITY): Payer: Self-pay | Admitting: Internal Medicine

## 2022-02-24 ENCOUNTER — Encounter: Payer: Self-pay | Admitting: Gastroenterology

## 2022-02-24 ENCOUNTER — Ambulatory Visit (INDEPENDENT_AMBULATORY_CARE_PROVIDER_SITE_OTHER): Payer: Medicare Other | Admitting: Gastroenterology

## 2022-02-24 VITALS — BP 144/84 | HR 73 | Temp 97.5°F | Ht 71.0 in | Wt 319.2 lb

## 2022-02-24 DIAGNOSIS — K5900 Constipation, unspecified: Secondary | ICD-10-CM | POA: Diagnosis not present

## 2022-02-24 DIAGNOSIS — Z6841 Body Mass Index (BMI) 40.0 and over, adult: Secondary | ICD-10-CM | POA: Diagnosis not present

## 2022-02-24 DIAGNOSIS — R030 Elevated blood-pressure reading, without diagnosis of hypertension: Secondary | ICD-10-CM | POA: Diagnosis not present

## 2022-02-24 DIAGNOSIS — G8929 Other chronic pain: Secondary | ICD-10-CM

## 2022-02-24 DIAGNOSIS — M549 Dorsalgia, unspecified: Secondary | ICD-10-CM | POA: Diagnosis not present

## 2022-02-24 DIAGNOSIS — Z20828 Contact with and (suspected) exposure to other viral communicable diseases: Secondary | ICD-10-CM | POA: Diagnosis not present

## 2022-02-24 DIAGNOSIS — I10 Essential (primary) hypertension: Secondary | ICD-10-CM | POA: Diagnosis not present

## 2022-02-24 DIAGNOSIS — J329 Chronic sinusitis, unspecified: Secondary | ICD-10-CM | POA: Diagnosis not present

## 2022-02-24 MED ORDER — TIRZEPATIDE 15 MG/0.5ML ~~LOC~~ SOAJ: 15.0000 mg | SUBCUTANEOUS | 1 refills | Status: DC
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
## 2022-02-24 NOTE — Patient Instructions (Signed)
Let's start Linzess 145 mcg once each morning, 30 minutes before breakfast.  It is normal to have some looser stool starting out for the first few days, but this should improve. If it does not, please call us, as we will need to adjust the dosage.   Let me know how this works for you! We can adjust the Linzess up or down if needed. Once we find the right dosage, I will send to your pharmacy.  We will see you in 3 months!  I enjoyed seeing you again today! At our first visit, I mentioned how I value our relationship and want to provide genuine, compassionate, and quality care. You may receive a survey regarding your visit with me, and I welcome your feedback! Thanks so much for taking the time to complete this. I look forward to seeing you again.   Annitta Needs, PhD, ANP-BC [facility] Gastroenter[facility]

## 2022-02-25 DIAGNOSIS — H02532 Eyelid retraction right lower eyelid: Secondary | ICD-10-CM | POA: Diagnosis not present

## 2022-02-25 DIAGNOSIS — H02112 Cicatricial ectropion of right lower eyelid: Secondary | ICD-10-CM | POA: Diagnosis not present

## 2022-02-25 DIAGNOSIS — C441192 Basal cell carcinoma of skin of left lower eyelid, including canthus: Secondary | ICD-10-CM | POA: Diagnosis not present

## 2022-02-25 DIAGNOSIS — H0279 Other degenerative disorders of eyelid and periocular area: Secondary | ICD-10-CM | POA: Diagnosis not present

## 2022-03-01 DIAGNOSIS — M5416 Radiculopathy, lumbar region: Secondary | ICD-10-CM | POA: Diagnosis not present

## 2022-03-01 DIAGNOSIS — Z6841 Body Mass Index (BMI) 40.0 and over, adult: Secondary | ICD-10-CM | POA: Diagnosis not present

## 2022-03-02 ENCOUNTER — Other Ambulatory Visit: Payer: Self-pay | Admitting: Cardiology

## 2022-03-02 ENCOUNTER — Ambulatory Visit: Payer: Medicare Other

## 2022-03-02 ENCOUNTER — Other Ambulatory Visit: Payer: Self-pay | Admitting: Neurosurgery

## 2022-03-02 DIAGNOSIS — I4819 Other persistent atrial fibrillation: Secondary | ICD-10-CM

## 2022-03-02 DIAGNOSIS — G8929 Other chronic pain: Secondary | ICD-10-CM

## 2022-03-02 DIAGNOSIS — I495 Sick sinus syndrome: Secondary | ICD-10-CM

## 2022-03-02 LAB — CUP PACEART REMOTE DEVICE CHECK
Battery Remaining Longevity: 98 mo
Battery Remaining Percentage: 90 %
Battery Voltage: 3.01 V
Brady Statistic AP VP Percent: 2.4 %
Brady Statistic AP VS Percent: 87 %
Brady Statistic AS VP Percent: 1 %
Brady Statistic AS VS Percent: 8.6 %
Brady Statistic RA Percent Paced: 87 %
Brady Statistic RV Percent Paced: 3.5 %
Date Time Interrogation Session: 20240206021109
Implantable Lead Connection Status: 753985
Implantable Lead Connection Status: 753985
Implantable Lead Implant Date: 20221108
Implantable Lead Implant Date: 20221108
Implantable Lead Location: 753859
Implantable Lead Location: 753860
Implantable Pulse Generator Implant Date: 20221108
Lead Channel Impedance Value: 480 Ohm
Lead Channel Impedance Value: 530 Ohm
Lead Channel Pacing Threshold Amplitude: 0.5 V
Lead Channel Pacing Threshold Amplitude: 0.75 V
Lead Channel Pacing Threshold Pulse Width: 0.5 ms
Lead Channel Pacing Threshold Pulse Width: 0.5 ms
Lead Channel Sensing Intrinsic Amplitude: 12 mV
Lead Channel Sensing Intrinsic Amplitude: 5 mV
Lead Channel Setting Pacing Amplitude: 2 V
Lead Channel Setting Pacing Amplitude: 2.5 V
Lead Channel Setting Pacing Pulse Width: 0.5 ms
Lead Channel Setting Sensing Sensitivity: 2 mV
Pulse Gen Model: 2272
Pulse Gen Serial Number: 3968031

## 2022-03-02 NOTE — Telephone Encounter (Signed)
Prescription refill request for Eliquis received. Indication: Afib  Last office visit: 1//15/24 Lawrence Jordan)  Scr: 0.75 (02/23/22)  Age: 72 Weight: 144.8kg  Appropriate dose. Refill sent.

## 2022-03-04 ENCOUNTER — Encounter (HOSPITAL_COMMUNITY): Payer: Self-pay | Admitting: *Deleted

## 2022-03-25 ENCOUNTER — Encounter: Payer: Self-pay | Admitting: Radiology

## 2022-03-30 NOTE — Progress Notes (Signed)
Remote pacemaker transmission.   

## 2022-04-05 ENCOUNTER — Ambulatory Visit (INDEPENDENT_AMBULATORY_CARE_PROVIDER_SITE_OTHER): Payer: Medicare Other | Admitting: Orthopedic Surgery

## 2022-04-05 ENCOUNTER — Encounter: Payer: Self-pay | Admitting: Orthopedic Surgery

## 2022-04-05 DIAGNOSIS — M1712 Unilateral primary osteoarthritis, left knee: Secondary | ICD-10-CM

## 2022-04-05 DIAGNOSIS — M25562 Pain in left knee: Secondary | ICD-10-CM

## 2022-04-05 DIAGNOSIS — G8929 Other chronic pain: Secondary | ICD-10-CM | POA: Diagnosis not present

## 2022-04-05 MED ORDER — METHYLPREDNISOLONE ACETATE 40 MG/ML IJ SUSP
40.0000 mg | Freq: Once | INTRAMUSCULAR | Status: AC
  Administered 2022-04-05: 40 mg via INTRA_ARTICULAR

## 2022-04-05 NOTE — Progress Notes (Signed)
Chief Complaint  Patient presents with   Knee Pain    Left     Lawrence Jordan had an episode of bilateral hip pain status post bilateral total hips I believe in 2008 and 2016 which was associated with some left leg and knee pain however, that has subsided except for the knee pain and he is requesting injection left knee  Encounter Diagnoses  Name Primary?   Chronic pain of left knee Yes   Primary osteoarthritis of left knee     Procedure note left knee injection   verbal consent was obtained to inject left knee joint  Timeout was completed to confirm the site of injection  The medications used were depomedrol 40 mg and 1% lidocaine 3 cc Anesthesia was provided by ethyl chloride and the skin was prepped with alcohol.  After cleaning the skin with alcohol a 20-gauge needle was used to inject the left knee joint. There were no complications. A sterile bandage was applied.

## 2022-04-06 DIAGNOSIS — H401213 Low-tension glaucoma, right eye, severe stage: Secondary | ICD-10-CM | POA: Diagnosis not present

## 2022-04-06 DIAGNOSIS — H04123 Dry eye syndrome of bilateral lacrimal glands: Secondary | ICD-10-CM | POA: Diagnosis not present

## 2022-04-06 DIAGNOSIS — H25813 Combined forms of age-related cataract, bilateral: Secondary | ICD-10-CM | POA: Diagnosis not present

## 2022-04-06 DIAGNOSIS — H02125 Mechanical ectropion of left lower eyelid: Secondary | ICD-10-CM | POA: Diagnosis not present

## 2022-04-06 DIAGNOSIS — H401222 Low-tension glaucoma, left eye, moderate stage: Secondary | ICD-10-CM | POA: Diagnosis not present

## 2022-04-07 DIAGNOSIS — L600 Ingrowing nail: Secondary | ICD-10-CM | POA: Diagnosis not present

## 2022-04-07 DIAGNOSIS — Z6841 Body Mass Index (BMI) 40.0 and over, adult: Secondary | ICD-10-CM | POA: Diagnosis not present

## 2022-04-07 DIAGNOSIS — R030 Elevated blood-pressure reading, without diagnosis of hypertension: Secondary | ICD-10-CM | POA: Diagnosis not present

## 2022-04-07 DIAGNOSIS — N390 Urinary tract infection, site not specified: Secondary | ICD-10-CM | POA: Diagnosis not present

## 2022-04-07 DIAGNOSIS — B372 Candidiasis of skin and nail: Secondary | ICD-10-CM | POA: Diagnosis not present

## 2022-04-09 ENCOUNTER — Encounter: Payer: Self-pay | Admitting: Physician Assistant

## 2022-04-14 DIAGNOSIS — B372 Candidiasis of skin and nail: Secondary | ICD-10-CM | POA: Diagnosis not present

## 2022-04-14 DIAGNOSIS — R030 Elevated blood-pressure reading, without diagnosis of hypertension: Secondary | ICD-10-CM | POA: Diagnosis not present

## 2022-04-14 DIAGNOSIS — L600 Ingrowing nail: Secondary | ICD-10-CM | POA: Diagnosis not present

## 2022-04-14 DIAGNOSIS — Z6841 Body Mass Index (BMI) 40.0 and over, adult: Secondary | ICD-10-CM | POA: Diagnosis not present

## 2022-04-29 ENCOUNTER — Ambulatory Visit (HOSPITAL_COMMUNITY): Payer: Medicare Other

## 2022-05-04 ENCOUNTER — Inpatient Hospital Stay (HOSPITAL_BASED_OUTPATIENT_CLINIC_OR_DEPARTMENT_OTHER): Payer: Medicare Other | Admitting: Internal Medicine

## 2022-05-04 ENCOUNTER — Other Ambulatory Visit: Payer: Self-pay

## 2022-05-04 ENCOUNTER — Inpatient Hospital Stay: Payer: Medicare Other | Attending: Physician Assistant

## 2022-05-04 VITALS — BP 131/69 | HR 82 | Temp 97.7°F | Resp 19 | Ht 71.0 in | Wt 323.6 lb

## 2022-05-04 DIAGNOSIS — D500 Iron deficiency anemia secondary to blood loss (chronic): Secondary | ICD-10-CM | POA: Insufficient documentation

## 2022-05-04 DIAGNOSIS — D649 Anemia, unspecified: Secondary | ICD-10-CM

## 2022-05-04 DIAGNOSIS — K5520 Angiodysplasia of colon without hemorrhage: Secondary | ICD-10-CM | POA: Insufficient documentation

## 2022-05-04 LAB — IRON AND IRON BINDING CAPACITY (CC-WL,HP ONLY)
Iron: 107 ug/dL (ref 45–182)
Saturation Ratios: 24 % (ref 17.9–39.5)
TIBC: 455 ug/dL — ABNORMAL HIGH (ref 250–450)
UIBC: 348 ug/dL (ref 117–376)

## 2022-05-04 LAB — CBC WITH DIFFERENTIAL (CANCER CENTER ONLY)
Abs Immature Granulocytes: 0.01 10*3/uL (ref 0.00–0.07)
Basophils Absolute: 0.1 10*3/uL (ref 0.0–0.1)
Basophils Relative: 1 %
Eosinophils Absolute: 0.2 10*3/uL (ref 0.0–0.5)
Eosinophils Relative: 2 %
HCT: 46.3 % (ref 39.0–52.0)
Hemoglobin: 15.9 g/dL (ref 13.0–17.0)
Immature Granulocytes: 0 %
Lymphocytes Relative: 26 %
Lymphs Abs: 1.7 10*3/uL (ref 0.7–4.0)
MCH: 30.5 pg (ref 26.0–34.0)
MCHC: 34.3 g/dL (ref 30.0–36.0)
MCV: 88.9 fL (ref 80.0–100.0)
Monocytes Absolute: 0.5 10*3/uL (ref 0.1–1.0)
Monocytes Relative: 8 %
Neutro Abs: 4.1 10*3/uL (ref 1.7–7.7)
Neutrophils Relative %: 63 %
Platelet Count: 160 10*3/uL (ref 150–400)
RBC: 5.21 MIL/uL (ref 4.22–5.81)
RDW: 18.6 % — ABNORMAL HIGH (ref 11.5–15.5)
WBC Count: 6.5 10*3/uL (ref 4.0–10.5)
nRBC: 0 % (ref 0.0–0.2)

## 2022-05-04 LAB — FERRITIN: Ferritin: 21 ng/mL — ABNORMAL LOW (ref 24–336)
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
## 2022-05-05 ENCOUNTER — Encounter: Payer: Self-pay | Admitting: Podiatry

## 2022-05-05 ENCOUNTER — Ambulatory Visit (INDEPENDENT_AMBULATORY_CARE_PROVIDER_SITE_OTHER): Payer: Medicare Other | Admitting: Podiatry

## 2022-05-05 DIAGNOSIS — E1165 Type 2 diabetes mellitus with hyperglycemia: Secondary | ICD-10-CM | POA: Diagnosis not present

## 2022-05-05 DIAGNOSIS — M79674 Pain in right toe(s): Secondary | ICD-10-CM

## 2022-05-05 DIAGNOSIS — Z794 Long term (current) use of insulin: Secondary | ICD-10-CM | POA: Diagnosis not present

## 2022-05-05 DIAGNOSIS — B351 Tinea unguium: Secondary | ICD-10-CM | POA: Insufficient documentation

## 2022-05-05 NOTE — Progress Notes (Signed)
This patient presents to the office with chief complaint of long thick nails and diabetic feet.  This patient  says there  is  no pain and discomfort in their feet.  This patient says there are long thick painful nails.  These nails are painful walking and wearing shoes.  Patient has no history of infection or drainage from both feet.  Patient is unable to  self treat his own nails . This patient presents  to the office today for treatment of the  long nails and a foot evaluation due to history of  diabetes.  General Appearance  Alert, conversant and in no acute stress.  Vascular  Dorsalis pedis and posterior tibial  pulses are palpable  bilaterally.  Capillary return is within normal limits  bilaterally. Temperature is within normal limits  bilaterally.  Neurologic  Senn-Weinstein monofilament wire test within normal limits  bilaterally. Muscle power within normal limits bilaterally.  Nails Thick disfigured discolored nails with subungual debris  from hallux to fifth toes right foot. No evidence of bacterial infection or drainage bilaterally.  Orthopedic  No limitations of motion of motion feet .  No crepitus or effusions noted.  No bony pathology or digital deformities noted.  Skin  normotropic skin with no porokeratosis noted bilaterally.  No signs of infections or ulcers noted.     Onychomycosis  Diabetes with no foot complications  IE  Debride nails x 10.  A diabetic foot exam was performed and there is no evidence of any vascular or neurologic pathology.   RTC 3 months.   Helane Gunther DPM

## 2022-05-21 ENCOUNTER — Telehealth: Payer: Self-pay

## 2022-05-21 MED ORDER — METOPROLOL TARTRATE 25 MG PO TABS: 50.0000 mg | ORAL_TABLET | Freq: Two times a day (BID) | ORAL | 3 refills | Status: DC

## 2022-05-21 NOTE — Telephone Encounter (Signed)
Alert received from [employer]:  Device alert for HVR Event occurred 4/26 @ 00:08, EGM shows AFL, duration , 19sec, mean HR 221 - route to triage AFL ongoing from 4/25, poor rates per trends Burden <1%, Eliquis per [facility]  Outreach made to Pt.  His BP today is 140/91 and heart rate 130 bpm. He states he has noticed fatigue, but he also says he has not been sleeping well lately.  He also notices some shortness of breath with exertion.  Discussed with DOD.  Will increase metoprolol to 50 mg PO BID until follow up appt next week with Dr. Nelly Laurence.  Pt indicates understanding.  He will call back on Monday if he continues to have elevated heart rates.

## 2022-05-24 ENCOUNTER — Ambulatory Visit (HOSPITAL_COMMUNITY)
Admission: RE | Admit: 2022-05-24 | Discharge: 2022-05-24 | Disposition: A | Discharge status: Home / Self Care | Payer: Medicare Other | Source: Ambulatory Visit | Attending: Internal Medicine | Admitting: Internal Medicine

## 2022-05-24 VITALS — BP 126/108 | HR 153 | Ht 71.0 in | Wt 327.4 lb

## 2022-05-24 DIAGNOSIS — Z6841 Body Mass Index (BMI) 40.0 and over, adult: Secondary | ICD-10-CM | POA: Insufficient documentation

## 2022-05-24 DIAGNOSIS — I4892 Unspecified atrial flutter: Secondary | ICD-10-CM

## 2022-05-24 DIAGNOSIS — Z794 Long term (current) use of insulin: Secondary | ICD-10-CM | POA: Diagnosis not present

## 2022-05-24 DIAGNOSIS — E119 Type 2 diabetes mellitus without complications: Secondary | ICD-10-CM | POA: Diagnosis not present

## 2022-05-24 DIAGNOSIS — I4819 Other persistent atrial fibrillation: Secondary | ICD-10-CM | POA: Diagnosis not present

## 2022-05-24 DIAGNOSIS — D6869 Other thrombophilia: Secondary | ICD-10-CM

## 2022-05-24 DIAGNOSIS — Z8249 Family history of ischemic heart disease and other diseases of the circulatory system: Secondary | ICD-10-CM | POA: Insufficient documentation

## 2022-05-24 DIAGNOSIS — Z950 Presence of cardiac pacemaker: Secondary | ICD-10-CM | POA: Insufficient documentation

## 2022-05-24 DIAGNOSIS — Z7901 Long term (current) use of anticoagulants: Secondary | ICD-10-CM | POA: Insufficient documentation

## 2022-05-24 DIAGNOSIS — I10 Essential (primary) hypertension: Secondary | ICD-10-CM | POA: Diagnosis not present

## 2022-05-24 DIAGNOSIS — G4733 Obstructive sleep apnea (adult) (pediatric): Secondary | ICD-10-CM | POA: Diagnosis not present

## 2022-05-24 DIAGNOSIS — Z7984 Long term (current) use of oral hypoglycemic drugs: Secondary | ICD-10-CM | POA: Insufficient documentation

## 2022-05-24 DIAGNOSIS — K219 Gastro-esophageal reflux disease without esophagitis: Secondary | ICD-10-CM | POA: Diagnosis not present

## 2022-05-24 DIAGNOSIS — E669 Obesity, unspecified: Secondary | ICD-10-CM | POA: Insufficient documentation

## 2022-05-24 LAB — BASIC METABOLIC PANEL
Anion gap: 8 (ref 5–15)
BUN: 16 mg/dL (ref 8–23)
CO2: 22 mmol/L (ref 22–32)
Calcium: 9.1 mg/dL (ref 8.9–10.3)
Chloride: 105 mmol/L (ref 98–111)
Creatinine, Ser: 0.76 mg/dL (ref 0.61–1.24)
GFR, Estimated: >60 mL/min (ref >60)
Glucose, Bld: 197 mg/dL — ABNORMAL HIGH (ref 70–99)
Potassium: 4 mmol/L (ref 3.5–5.1)
Sodium: 135 mmol/L (ref 135–145)

## 2022-05-24 LAB — CBC
HCT: 48.4 % (ref 39.0–52.0)
Hemoglobin: 15.9 g/dL (ref 13.0–17.0)
MCH: 30.6 pg (ref 26.0–34.0)
MCHC: 32.9 g/dL (ref 30.0–36.0)
MCV: 93.3 fL (ref 80.0–100.0)
Platelets: 167 10*3/uL (ref 150–400)
RBC: 5.19 MIL/uL (ref 4.22–5.81)
RDW: 15.8 % — ABNORMAL HIGH (ref 11.5–15.5)
WBC: 6.1 10*3/uL (ref 4.0–10.5)
nRBC: 0 % (ref 0.0–0.2)
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
## 2022-05-24 NOTE — Patient Instructions (Addendum)
Increase metoprolol 75mg  twice a day -- day of cardioversion reduce to normal dosing 25mg  twice a day   [facility] May 6th   Cardioversion scheduled for: Tuesday, May 7th   - Arrive at the [employer] and go to admitting at 930am   - Do not eat or drink anything after midnight the night prior to your procedure.   - Take all your morning medication (except diabetic medications) with a sip of water prior to arrival.  - You will not be able to drive home after your procedure.    - Do NOT miss any doses of your blood thinner - if you should miss a dose please notify our office immediately.   - If you feel as if you go back into normal rhythm prior to scheduled cardioversion, please notify our office immediately.   If your procedure is canceled in the cardioversion suite you will be charged a cancellation fee.    Hold medication 7 days prior to scheduled procedure/anesthesia.  Restart medication on the normal dosing day after scheduled procedure/anesthesia  Dulaglutide (Trulicity) Exenatide extended release (Bydureon bcise) Semaglutide (Ozempic) (WEGOVY)  Tirzepatide (Mounjaro)     Hold medication 24 hours prior to scheduled procedure/anesthesia.   Restart medication on the following day after scheduled procedure/anesthesia   Exenatide (Byetta)  Liraglutide (Victoza, Saxenda)  Lixisenatide (Adlyxin)  Semaglutide (Rybelsus) Polyethylene Glycol Loxenatide   For those patients who have a scheduled procedure/anesthesia on the same day of the week as their dose, hold the medication on the day of surgery.  They can take their scheduled dose the week before.  **Patients on the above medications scheduled for elective procedures that have not held the medication for the appropriate amount of time are at risk of cancellation or change in the anesthetic plan.

## 2022-05-24 NOTE — Telephone Encounter (Signed)
Patient called back stating he is still having elevated hr of 150. Pt  would like a call back as to what to do next
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
## 2022-05-24 NOTE — Telephone Encounter (Addendum)
Patient calls in today with reports of fatigue, SOB with exertion at times and feeling heart fluttering.  Remote transmission sent in. Patient's presenting shows AF with RVR rates in the 150's. Having hx of Afib with RVR for several days.  Dr. Nelly Laurence is in the hospital today.  Reviewed with Afib clinic and was able to get patient in with Landry Mellow at 3:30pm today to evaluate.

## 2022-05-25 ENCOUNTER — Other Ambulatory Visit (HOSPITAL_COMMUNITY): Payer: Self-pay | Admitting: *Deleted

## 2022-05-25 ENCOUNTER — Ambulatory Visit: Payer: Medicare Other | Admitting: Gastroenterology

## 2022-05-25 ENCOUNTER — Encounter: Payer: Self-pay | Admitting: Gastroenterology

## 2022-05-25 ENCOUNTER — Ambulatory Visit (INDEPENDENT_AMBULATORY_CARE_PROVIDER_SITE_OTHER): Payer: Medicare Other | Admitting: Gastroenterology

## 2022-05-25 ENCOUNTER — Ambulatory Visit: Payer: Medicare Other | Admitting: Endocrinology

## 2022-05-25 VITALS — BP 140/80 | HR 88 | Temp 98.2°F | Ht 71.0 in | Wt 328.8 lb

## 2022-05-25 DIAGNOSIS — K2270 Barrett's esophagus without dysplasia: Secondary | ICD-10-CM | POA: Diagnosis not present

## 2022-05-25 DIAGNOSIS — E1165 Type 2 diabetes mellitus with hyperglycemia: Secondary | ICD-10-CM

## 2022-05-25 DIAGNOSIS — K2100 Gastro-esophageal reflux disease with esophagitis, without bleeding: Secondary | ICD-10-CM | POA: Diagnosis not present

## 2022-05-25 NOTE — Patient Instructions (Signed)
Continue pantoprazole 40 mg daily before breakfast.  We will have you come back in 01/2024, at that time you will be due for your next upper endoscopy.  I will look at your upcoming MRI results as available. If there is any abnormality from a GI standpoint, we will let you know.  Continue to have your Hemoglobin checked periodically with your PCP.

## 2022-05-26 ENCOUNTER — Other Ambulatory Visit (HOSPITAL_COMMUNITY): Payer: Self-pay | Admitting: *Deleted

## 2022-05-26 MED ORDER — METOPROLOL TARTRATE 25 MG PO TABS: 25.0000 mg | ORAL_TABLET | Freq: Two times a day (BID) | ORAL | 3 refills | Status: DC

## 2022-05-28 ENCOUNTER — Other Ambulatory Visit: Payer: Self-pay

## 2022-05-28 ENCOUNTER — Encounter: Payer: Self-pay | Admitting: Cardiovascular Disease

## 2022-05-28 ENCOUNTER — Inpatient Hospital Stay (HOSPITAL_COMMUNITY)
Admission: EM | Admit: 2022-05-28 | Discharge: 2022-06-01 | DRG: 309 | Disposition: A | Discharge status: Home / Self Care | Payer: Medicare Other | Attending: Cardiovascular Disease | Admitting: Cardiovascular Disease

## 2022-05-28 ENCOUNTER — Ambulatory Visit (INDEPENDENT_AMBULATORY_CARE_PROVIDER_SITE_OTHER): Payer: Medicare Other | Admitting: Cardiovascular Disease

## 2022-05-28 ENCOUNTER — Encounter (HOSPITAL_COMMUNITY): Payer: Self-pay

## 2022-05-28 ENCOUNTER — Emergency Department (HOSPITAL_COMMUNITY): Payer: Medicare Other

## 2022-05-28 VITALS — BP 124/98 | HR 144 | Ht 71.0 in | Wt 323.2 lb

## 2022-05-28 DIAGNOSIS — Z8249 Family history of ischemic heart disease and other diseases of the circulatory system: Secondary | ICD-10-CM

## 2022-05-28 DIAGNOSIS — I110 Hypertensive heart disease with heart failure: Secondary | ICD-10-CM | POA: Diagnosis present

## 2022-05-28 DIAGNOSIS — I4819 Other persistent atrial fibrillation: Secondary | ICD-10-CM

## 2022-05-28 DIAGNOSIS — Z823 Family history of stroke: Secondary | ICD-10-CM

## 2022-05-28 DIAGNOSIS — Z91041 Radiographic dye allergy status: Secondary | ICD-10-CM

## 2022-05-28 DIAGNOSIS — Z7984 Long term (current) use of oral hypoglycemic drugs: Secondary | ICD-10-CM | POA: Diagnosis not present

## 2022-05-28 DIAGNOSIS — I2510 Atherosclerotic heart disease of native coronary artery without angina pectoris: Secondary | ICD-10-CM | POA: Diagnosis present

## 2022-05-28 DIAGNOSIS — Z96643 Presence of artificial hip joint, bilateral: Secondary | ICD-10-CM | POA: Diagnosis present

## 2022-05-28 DIAGNOSIS — Z880 Allergy status to penicillin: Secondary | ICD-10-CM | POA: Diagnosis not present

## 2022-05-28 DIAGNOSIS — G4733 Obstructive sleep apnea (adult) (pediatric): Secondary | ICD-10-CM | POA: Diagnosis not present

## 2022-05-28 DIAGNOSIS — Z794 Long term (current) use of insulin: Secondary | ICD-10-CM

## 2022-05-28 DIAGNOSIS — I4892 Unspecified atrial flutter: Principal | ICD-10-CM

## 2022-05-28 DIAGNOSIS — K219 Gastro-esophageal reflux disease without esophagitis: Secondary | ICD-10-CM | POA: Diagnosis present

## 2022-05-28 DIAGNOSIS — Z882 Allergy status to sulfonamides status: Secondary | ICD-10-CM | POA: Diagnosis not present

## 2022-05-28 DIAGNOSIS — Z8261 Family history of arthritis: Secondary | ICD-10-CM

## 2022-05-28 DIAGNOSIS — H409 Unspecified glaucoma: Secondary | ICD-10-CM | POA: Diagnosis present

## 2022-05-28 DIAGNOSIS — D509 Iron deficiency anemia, unspecified: Secondary | ICD-10-CM | POA: Diagnosis present

## 2022-05-28 DIAGNOSIS — Z7985 Long-term (current) use of injectable non-insulin antidiabetic drugs: Secondary | ICD-10-CM

## 2022-05-28 DIAGNOSIS — E7800 Pure hypercholesterolemia, unspecified: Secondary | ICD-10-CM | POA: Diagnosis not present

## 2022-05-28 DIAGNOSIS — I5032 Chronic diastolic (congestive) heart failure: Secondary | ICD-10-CM | POA: Diagnosis not present

## 2022-05-28 DIAGNOSIS — R001 Bradycardia, unspecified: Secondary | ICD-10-CM | POA: Diagnosis present

## 2022-05-28 DIAGNOSIS — I495 Sick sinus syndrome: Secondary | ICD-10-CM | POA: Diagnosis not present

## 2022-05-28 DIAGNOSIS — Z79899 Other long term (current) drug therapy: Secondary | ICD-10-CM

## 2022-05-28 DIAGNOSIS — E119 Type 2 diabetes mellitus without complications: Secondary | ICD-10-CM | POA: Diagnosis not present

## 2022-05-28 DIAGNOSIS — R5381 Other malaise: Secondary | ICD-10-CM | POA: Diagnosis present

## 2022-05-28 DIAGNOSIS — Z7901 Long term (current) use of anticoagulants: Secondary | ICD-10-CM

## 2022-05-28 DIAGNOSIS — Z85828 Personal history of other malignant neoplasm of skin: Secondary | ICD-10-CM

## 2022-05-28 DIAGNOSIS — I493 Ventricular premature depolarization: Secondary | ICD-10-CM | POA: Diagnosis present

## 2022-05-28 DIAGNOSIS — I484 Atypical atrial flutter: Secondary | ICD-10-CM | POA: Diagnosis not present

## 2022-05-28 DIAGNOSIS — Z6841 Body Mass Index (BMI) 40.0 and over, adult: Secondary | ICD-10-CM

## 2022-05-28 DIAGNOSIS — Z833 Family history of diabetes mellitus: Secondary | ICD-10-CM

## 2022-05-28 DIAGNOSIS — Z45018 Encounter for adjustment and management of other part of cardiac pacemaker: Secondary | ICD-10-CM

## 2022-05-28 DIAGNOSIS — I4891 Unspecified atrial fibrillation: Secondary | ICD-10-CM | POA: Diagnosis present

## 2022-05-28 LAB — CUP PACEART INCLINIC DEVICE CHECK
Battery Remaining Longevity: 97 mo
Battery Voltage: 3.01 V
Brady Statistic RA Percent Paced: 84 %
Brady Statistic RV Percent Paced: 1.6 %
Date Time Interrogation Session: 20240503130119
Implantable Lead Connection Status: 753985
Implantable Lead Connection Status: 753985
Implantable Lead Implant Date: 20221108
Implantable Lead Implant Date: 20221108
Implantable Lead Location: 753859
Implantable Lead Location: 753860
Implantable Pulse Generator Implant Date: 20221108
Lead Channel Impedance Value: 475 Ohm
Lead Channel Impedance Value: 487.5 Ohm
Lead Channel Sensing Intrinsic Amplitude: 12 mV
Lead Channel Sensing Intrinsic Amplitude: 4 mV
Lead Channel Setting Pacing Amplitude: 2 V
Lead Channel Setting Pacing Amplitude: 2.5 V
Lead Channel Setting Pacing Pulse Width: 0.5 ms
Lead Channel Setting Sensing Sensitivity: 2 mV
Pulse Gen Model: 2272
Pulse Gen Serial Number: 3968031

## 2022-05-28 LAB — CBC
HCT: 47.4 % (ref 39.0–52.0)
Hemoglobin: 15.9 g/dL (ref 13.0–17.0)
MCH: 31.1 pg (ref 26.0–34.0)
MCHC: 33.5 g/dL (ref 30.0–36.0)
MCV: 92.6 fL (ref 80.0–100.0)
Platelets: 168 10*3/uL (ref 150–400)
RBC: 5.12 MIL/uL (ref 4.22–5.81)
RDW: 15.5 % (ref 11.5–15.5)
WBC: 6.7 10*3/uL (ref 4.0–10.5)
nRBC: 0 % (ref 0.0–0.2)

## 2022-05-28 LAB — COMPREHENSIVE METABOLIC PANEL
ALT: 36 U/L (ref 0–44)
AST: 28 U/L (ref 15–41)
Albumin: 3.9 g/dL (ref 3.5–5.0)
Alkaline Phosphatase: 51 U/L (ref 38–126)
Anion gap: 12 (ref 5–15)
BUN: 15 mg/dL (ref 8–23)
CO2: 21 mmol/L — ABNORMAL LOW (ref 22–32)
Calcium: 9.2 mg/dL (ref 8.9–10.3)
Chloride: 104 mmol/L (ref 98–111)
Creatinine, Ser: 0.81 mg/dL (ref 0.61–1.24)
GFR, Estimated: >60 mL/min (ref >60)
Glucose, Bld: 134 mg/dL — ABNORMAL HIGH (ref 70–99)
Potassium: 3.8 mmol/L (ref 3.5–5.1)
Sodium: 137 mmol/L (ref 135–145)
Total Bilirubin: 0.4 mg/dL (ref 0.3–1.2)
Total Protein: 7.3 g/dL (ref 6.5–8.1)

## 2022-05-28 LAB — TROPONIN I (HIGH SENSITIVITY)
Troponin I (High Sensitivity): 11 ng/L (ref <18)
Troponin I (High Sensitivity): 10 ng/L (ref <18)

## 2022-05-28 LAB — CBG MONITORING, ED: Glucose-Capillary: 126 mg/dL — ABNORMAL HIGH (ref 70–99)

## 2022-05-28 LAB — MAGNESIUM
Magnesium: 1.7 mg/dL (ref 1.7–2.4)
Magnesium: 2.5 mg/dL — ABNORMAL HIGH (ref 1.7–2.4)

## 2022-05-28 LAB — GLUCOSE, CAPILLARY: Glucose-Capillary: 192 mg/dL — ABNORMAL HIGH (ref 70–99)

## 2022-05-28 LAB — BRAIN NATRIURETIC PEPTIDE: B Natriuretic Peptide: 60.6 pg/mL (ref 0.0–100.0)

## 2022-05-28 MED ORDER — LOSARTAN POTASSIUM 50 MG PO TABS
50.0000 mg | ORAL_TABLET | Freq: Every day | ORAL | Status: DC
  Administered 2022-05-29 – 2022-06-01 (×4): 50 mg via ORAL
  Filled 2022-05-28 (×4): qty 1

## 2022-05-28 MED ORDER — MAGNESIUM SULFATE 2 GM/50ML IV SOLN
2.0000 g | Freq: Once | INTRAVENOUS | Status: AC
  Administered 2022-05-28: 2 g via INTRAVENOUS
  Filled 2022-05-28: qty 50

## 2022-05-28 MED ORDER — FUROSEMIDE 10 MG/ML IJ SOLN
40.0000 mg | Freq: Once | INTRAMUSCULAR | Status: AC
  Administered 2022-05-28: 40 mg via INTRAVENOUS
  Filled 2022-05-28: qty 4

## 2022-05-28 MED ORDER — INSULIN ASPART 100 UNIT/ML IJ SOLN
0.0000 [IU] | Freq: Three times a day (TID) | INTRAMUSCULAR | Status: DC
  Administered 2022-05-29 (×2): 3 [IU] via SUBCUTANEOUS
  Administered 2022-05-29 – 2022-05-30 (×3): 2 [IU] via SUBCUTANEOUS
  Administered 2022-05-31: 5 [IU] via SUBCUTANEOUS
  Administered 2022-05-31 – 2022-06-01 (×2): 2 [IU] via SUBCUTANEOUS

## 2022-05-28 MED ORDER — ETOMIDATE 2 MG/ML IV SOLN
10.0000 mg | Freq: Once | INTRAVENOUS | Status: AC | PRN
  Administered 2022-05-28: 10 mg via INTRAVENOUS
  Filled 2022-05-28: qty 10

## 2022-05-28 MED ORDER — MIDAZOLAM HCL 2 MG/2ML IJ SOLN
2.0000 mg | INTRAMUSCULAR | Status: DC | PRN
  Filled 2022-05-28: qty 2

## 2022-05-28 MED ORDER — POTASSIUM CHLORIDE CRYS ER 20 MEQ PO TBCR
40.0000 meq | EXTENDED_RELEASE_TABLET | Freq: Once | ORAL | Status: AC
  Administered 2022-05-28: 40 meq via ORAL
  Filled 2022-05-28: qty 2

## 2022-05-28 MED ORDER — SODIUM CHLORIDE 0.9% FLUSH
3.0000 mL | Freq: Two times a day (BID) | INTRAVENOUS | Status: DC
  Administered 2022-05-28 – 2022-06-01 (×8): 3 mL via INTRAVENOUS

## 2022-05-28 MED ORDER — CALCIUM CARBONATE ANTACID 500 MG PO CHEW
1500.0000 mg | CHEWABLE_TABLET | Freq: Every day | ORAL | Status: DC
  Filled 2022-05-28: qty 8
  Filled 2022-05-28 (×3): qty 3

## 2022-05-28 MED ORDER — APIXABAN 5 MG PO TABS
5.0000 mg | ORAL_TABLET | Freq: Two times a day (BID) | ORAL | Status: DC
  Administered 2022-05-28 – 2022-06-01 (×8): 5 mg via ORAL
  Filled 2022-05-28 (×8): qty 1

## 2022-05-28 MED ORDER — EMPAGLIFLOZIN 25 MG PO TABS
25.0000 mg | ORAL_TABLET | Freq: Every day | ORAL | Status: DC
  Administered 2022-05-29 – 2022-06-01 (×4): 25 mg via ORAL
  Filled 2022-05-28 (×4): qty 1

## 2022-05-28 MED ORDER — SODIUM CHLORIDE 0.9 % IV SOLN: 250.0000 mL | INTRAVENOUS | Status: DC | PRN

## 2022-05-28 MED ORDER — METOPROLOL TARTRATE 25 MG PO TABS
25.0000 mg | ORAL_TABLET | Freq: Two times a day (BID) | ORAL | Status: DC
  Administered 2022-05-28 – 2022-06-01 (×8): 25 mg via ORAL
  Filled 2022-05-28 (×8): qty 1

## 2022-05-28 MED ORDER — METFORMIN HCL 500 MG PO TABS
1000.0000 mg | ORAL_TABLET | Freq: Two times a day (BID) | ORAL | Status: DC
  Administered 2022-05-29 – 2022-06-01 (×7): 1000 mg via ORAL
  Filled 2022-05-28 (×8): qty 2

## 2022-05-28 MED ORDER — SODIUM CHLORIDE 0.9% FLUSH: 3.0000 mL | INTRAVENOUS | Status: DC | PRN

## 2022-05-28 MED ORDER — ACETAMINOPHEN 500 MG PO TABS
1000.0000 mg | ORAL_TABLET | Freq: Three times a day (TID) | ORAL | Status: DC | PRN
  Administered 2022-05-31 (×2): 1000 mg via ORAL
  Filled 2022-05-28 (×3): qty 2

## 2022-05-28 MED ORDER — SIMVASTATIN 20 MG PO TABS
20.0000 mg | ORAL_TABLET | Freq: Every day | ORAL | Status: DC
  Administered 2022-05-29 – 2022-05-31 (×3): 20 mg via ORAL
  Filled 2022-05-28 (×3): qty 1

## 2022-05-28 MED ORDER — TRAMADOL HCL 50 MG PO TABS
50.0000 mg | ORAL_TABLET | Freq: Three times a day (TID) | ORAL | Status: DC | PRN
  Administered 2022-05-31 (×2): 50 mg via ORAL
  Filled 2022-05-28 (×3): qty 1

## 2022-05-28 MED ORDER — DOFETILIDE 500 MCG PO CAPS
500.0000 ug | ORAL_CAPSULE | Freq: Two times a day (BID) | ORAL | Status: DC
  Administered 2022-05-28 – 2022-05-29 (×2): 500 ug via ORAL
  Filled 2022-05-28 (×2): qty 1

## 2022-05-28 MED ORDER — PANTOPRAZOLE SODIUM 40 MG PO TBEC
40.0000 mg | DELAYED_RELEASE_TABLET | Freq: Every day | ORAL | Status: DC
  Administered 2022-05-29 – 2022-06-01 (×4): 40 mg via ORAL
  Filled 2022-05-28 (×4): qty 1
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
## 2022-05-28 NOTE — ED Triage Notes (Addendum)
Pt arrived POV sent my his cardiologist for cardioversion and 3 day admission for a new drug they are going to start pt on. Pt is A-flutter w/ rate of 150's. Pt has significant cardiac hx including 3 cardioversions, ablations, and has a pacemaker. Pt feeling shob, increased work of breathing and tachypneic

## 2022-05-28 NOTE — Significant Event (Signed)
Received page from pharmacy regarding initiation of Tikosyn. Concern regarding Mg level and Qtc on EKG. The patient's Mg was 1.7 at prior check, he has received a dose of lasix and 4GM of MgSO4 repletion, but per protocol his recheck level is pending. He was also given of Kcl this evening.  Overall, low suspicion that patient would have hypomagnesemia or hypokalemia. Pharmacy requesting delay of dose to allow for patient's Mg to finish infusing - plan for initiation at 930pm.

## 2022-05-28 NOTE — Progress Notes (Signed)
Pharmacy: Dofetilide (Tikosyn) - Initial Consult Assessment and Electrolyte Replacement  Pharmacy consulted to assist in monitoring and replacing electrolytes in this 72 y.o. male admitted on 05/28/2022 undergoing dofetilide initiation. First dofetilide dose: 500 mcg BID  Assessment:  Patient Exclusion Criteria: If any screening criteria checked as "Yes", then  patient  should NOT receive dofetilide until criteria item is corrected.  If "Yes" please indicate correction plan.  YES  NO Patient  Exclusion Criteria Correction Plan   []   [x]   Baseline QTc interval is greater than or equal to 440 msec. IF above YES box checked dofetilide contraindicated unless patient has ICD; then may proceed if QTc 500-550 msec or with known ventricular conduction abnormalities may proceed with QTc 550-600 msec. QTc =   Per EP note, "post DCCV EKG is reviewed with Dr. Elberta Fortis, QTc is OK for Tikosyn start"    []   [x]   Patient is known or suspected to have a digoxin level greater than 2 ng/ml: No results found for: "DIGOXIN"     []   [x]   Creatinine clearance less than 20 ml/min (calculated using Cockcroft-Gault, actual body weight and serum creatinine): Estimated Creatinine Clearance: 122.8 mL/min (by C-G formula based on SCr of 0.81 mg/dL).     []   [x]  Patient has received drugs known to prolong the QT intervals within the last 48 hours (phenothiazines, tricyclics or tetracyclic antidepressants, erythromycin, H-1 antihistamines, cisapride, fluoroquinolones, azithromycin, ondansetron).   Updated information on QT prolonging agents is available to be searched on the following database:QT prolonging agents     []   [x]   Patient received a dose of hydrochlorothiazide (Oretic) alone or in any combination including triamterene (Dyazide, Maxzide) in the last 48 hours.    []   [x]  Patient received a medication known to increase dofetilide plasma concentrations prior to initial dofetilide dose:  Trimethoprim  (Primsol, Proloprim) in the last 36 hours Verapamil (Calan, Verelan) in the last 36 hours or a sustained release dose in the last 72 hours Megestrol (Megace) in the last 5 days  Cimetidine (Tagamet) in the last 6 hours Ketoconazole (Nizoral) in the last 24 hours Itraconazole (Sporanox) in the last 48 hours  Prochlorperazine (Compazine) in the last 36 hours     []   [x]   Patient is known to have a history of torsades de pointes; congenital or acquired long QT syndromes.    []   [x]   Patient has received a Class 1 antiarrhythmic with less than 2 half-lives since last dose. (Disopyramide, Quinidine, Procainamide, Lidocaine, Mexiletine, Flecainide, Propafenone)    []   [x]   Patient has received amiodarone therapy in the past 3 months or amiodarone level is greater than 0.3 ng/ml.    Labs:    Component Value Date/Time   K 3.8 05/28/2022 1507   MG 1.7 05/28/2022 1605     Plan: Select One Calculated CrCl  Dose q12h  [x]  > 60 ml/min 500 mcg  []  40-60 ml/min 250 mcg  []  20-40 ml/min 125 mcg   [x]   Physician selected initial dose within range recommended for patients level of renal function - will monitor for response.  []   Physician selected initial dose outside of range recommended for patients level of renal function - will discuss if the dose should be altered at this time.   Patient has been appropriately anticoagulated with Apixaban (last dose taken 5/3 am prior to arrival).  Potassium: K 3.8-3.9:  Hold Tikosyn initiation and give KCl 40 mEq po x1 then begin Tikosyn at least 2hr  after KCl dose - do not need to recheck K  --- supplementation ordered and given  Magnesium: Mg 1.3-1.7: Hold Tikosyn initiation and give Mg 4 gm x1 IV, recheck Mg 4hr after end of infusion - repeat appropriate dose if not > 1.8   --- 2g initially given repeat 2g dose ordered per protocol. 4 hr mag level from initial dose ordered. Discussed with Dr. Paulino Rily from Cardiology who recommends proceeding with  Tikosyn initiation tonight given current plan.   Thank you for allowing pharmacy to participate in this patient's care   Cathie Hoops 05/28/2022  6:55 PM

## 2022-05-28 NOTE — Patient Instructions (Addendum)
Medication Instructions:  Continue all current medications.  Labwork: none  Testing/Procedures: none  Follow-Up: Going to [facility] ED   Any Other Special Instructions Will Be Listed Below (If Applicable).   If you need a refill on your cardiac medications before your next appointment, please call your pharmacy.

## 2022-05-28 NOTE — ED Notes (Signed)
Iv team still at the bedside.  

## 2022-05-28 NOTE — ED Notes (Signed)
Notified CN of need for room 

## 2022-05-28 NOTE — Sedation Documentation (Signed)
For an unknown reason the chart never  documented the medication space that was once in the sedation  area.  So this had to be documented outside the sedation area  the pt was given etomidate 10 mg iv At 1740  1742 pt cardioverted at 100 by dr long at the bedside.  Currently the pt is in nsr with pacs
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
## 2022-05-28 NOTE — ED Provider Notes (Signed)
[place] EMERGENCY DEPARTMENT AT [facility] Provider Note  Medical Decision Making   HPI: Lawrence Jordan is a 72 y.o. male with history perinent HFpEF with EF of 60 to 65% (09/2019), HDL, HTN, CAD, atrial fibrillation and atrial flutter on Eliquis, OSA, obesity, Barrett's esophagus, GERD, obesity who presents complaining of atrial flutter. Patient arrived via POV companied by wife.  History provided by patient.  No interpreter required for this encounter.  Reports that he has had a fast heart rate and worsening shortness of breath for approximately 3 to 4 weeks.  Reports that shortness of breath is worse with exertion, improves with rest.  Reports that he will at times have brief minutes of intermittent chest pain that occurred and resolved spontaneously.  Reports that he has been taking all of his medications, however has noted some increased lower extremity edema.  Reports overall malaise and fatigue over the same time period.  Reports that due to the symptoms he presented to his cardiologist's office earlier today, was found to be in atrial flutter, and was recommended to present to the ED for further evaluation.  Patient reports that he has previously been cardioverted, last p.o. was supper on the evening of 5/2.  Reports that he has not missed a dose of Eliquis in months.  Most recent dose was this a.m.  ROS: As per HPI. Please see MAR for complete past medical history, surgical history, and social history.   Physical exam is pertinent for 30-1 40s, regular rhythm, trace edema bilateral lower extremities otherwise normotensive.   The differential includes but is not limited to a flutter, arrhythmia, ACS, heart failure exacerbation.  Additional history obtained from: Chart review External records from outside source obtained and reviewed including: Reviewed patient's cardiology note from [facility] cardiology, states that patient was found to be in atrial flutter at EP follow-up visit,  was recommended to present to the ED for cardioversion with admission thereafter for Tikosyn load.  ED provider interpretation of ECG: Rate 140s, a flutter with RVR, no appreciable ST elevations or depressions or T wave inversions  ED provider interpretation of radiology/imaging:  Chest x-ray without focal airspace opacification, cardiomediastinal silhouette derangement, pleural effusion, pneumothorax, bony displacement  Labs ordered were interpreted by myself as well as my attending and were incorporated into the medical decision making process for this patient.  ED provider interpretation of labs: CBC without leukocytosis, anemia, thrombocytopenia.  CMP without AKI or emergent electrolyte derangement.  BNP WNL.  Magnesium low end of normal at 1.7.  Initial troponin 11, delta 10.  Interventions: Etomidate  See the EMR for full details regarding lab and imaging results.  Overall well-appearing, normotensive on physical exam.  Patient is saturating well on room air, it patient is in atrial flutter on EKG with rate in the 140s.  Appears that patient has been consistent with his medications, no missed doses of prescribed medications.  After evaluation of the patient, medications, and outpatient cardiology notes, believe that cardioversion is reasonable, discussed this at the bedside with the patient, wife was also at bedside during this conversation, patient is amenable to cardioversion.  Labs came back reassuring, did not indicate other underlying etiology of atrial flutter.  Mag at the lower end of normal, will replete with 1 g IV mag.  Patient cardioverted as per procedure note below, spoke with cardiology, they are amenable to admission of the patient for Tikosyn load.  No additional acute events while patient was under my care in the  ED.     Consults: Cardiology   Disposition: ADMIT: I believe the patient requires admission for further care and management. The patient was admitted to  cardiology. Please see inpatient provider note for additional treatment plan details.   The plan for this patient was discussed with Dr. Jacqulyn Bath, who voiced agreement and who oversaw evaluation and treatment of this patient.  Clinical Impression:  1. Atrial flutter with rapid ventricular response (HCC)    Admit  Therapies: These medications and interventions were provided for the patient while in the ED. Medications  midazolam (VERSED) injection 2 mg (has no administration in time range)  magnesium sulfate IVPB 2 g 50 mL (2 g Intravenous New Bag/Given 05/28/22 1807)  potassium chloride SA (KLOR-CON M) CR tablet 40 mEq (has no administration in time range)  furosemide (LASIX) injection 40 mg (has no administration in time range)  sodium chloride flush (NS) 0.9 % injection 3 mL (has no administration in time range)  sodium chloride flush (NS) 0.9 % injection 3 mL (has no administration in time range)  0.9 %  sodium chloride infusion (has no administration in time range)  apixaban (ELIQUIS) tablet 5 mg (has no administration in time range)  dofetilide (TIKOSYN) capsule 500 mcg (has no administration in time range)  insulin aspart (novoLOG) injection 0-15 Units (has no administration in time range)  etomidate (AMIDATE) injection 10 mg (10 mg Intravenous Given 05/28/22 1735)    MDM generated using voice dictation software and may contain dictation errors.  Please contact me for any clarification or with any questions.  Clinical Complexity A medically appropriate history, review of systems, and physical exam was performed.  Collateral history obtained from: Wife, chart review I personally reviewed the labs, EKG, imaging as discussed above. Patient's presentation is most consistent with acute presentation with potential threat to life or bodily function Treatment: Hospitalization Medications: Parenteral controlled substances Discussed patient's care with providers from the following different  specialties: Cardiology  Physical Exam   ED Triage Vitals  Enc Vitals Group     BP 05/28/22 1422 (!) 131/110     Pulse Rate 05/28/22 1422 75     Resp 05/28/22 1422 (!) 22     Temp 05/28/22 1422 97.9 F (36.6 C)     Temp src --      SpO2 05/28/22 1422 96 %     Weight 05/28/22 1430 (!) 323 lb 3.2 oz (146.6 kg)     Height 05/28/22 1430 5\' 11"  (1.803 m)     Head Circumference --      Peak Flow --      Pain Score 05/28/22 1430 3     Pain Loc --      Pain Edu? --      Excl. in GC? --      Physical Exam Vitals and nursing note reviewed.  Constitutional:      General: He is not in acute distress.    Appearance: He is well-developed.  HENT:     Head: Normocephalic and atraumatic.     Mouth/Throat:     Mouth: Mucous membranes are moist.  Eyes:     Conjunctiva/sclera: Conjunctivae normal.  Cardiovascular:     Rate and Rhythm: Regular rhythm. Tachycardia present.     Heart sounds: No murmur heard. Pulmonary:     Effort: Pulmonary effort is normal. No respiratory distress.     Breath sounds: Normal breath sounds.  Abdominal:     Palpations: Abdomen is soft.  Tenderness: There is no abdominal tenderness.  Musculoskeletal:        General: No swelling.     Cervical back: Neck supple.     Right lower leg: Edema (1+) present.     Left lower leg: Edema (1+) present.  Skin:    General: Skin is warm and dry.     Capillary Refill: Capillary refill takes less than 2 seconds.  Neurological:     Mental Status: He is alert and oriented to person, place, and time.  Psychiatric:        Mood and Affect: Mood normal.       Procedure Note  .Cardioversion  Date/Time: 05/28/2022 3:08 PM  Performed by: Curley Spice, MD Authorized by: Maia Plan, MD   Consent:    Consent obtained:  Verbal   Consent given by:  Patient Pre-procedure details:    Cardioversion basis:  Elective   Rhythm:  Atrial flutter   Electrode placement:  Anterior-posterior Patient sedated: Yes. Refer to  sedation procedure documentation for details of sedation.  Attempt one:    Cardioversion mode:  Synchronous   Waveform:  Biphasic   Shock (Joules):  100   Shock outcome:  Conversion to normal sinus rhythm Post-procedure details:    Patient status:  Alert   Patient tolerance of procedure:  Tolerated well, no immediate complications .Sedation  Date/Time: 05/28/2022 5:49 PM  Performed by: Curley Spice, MD Authorized by: Maia Plan, MD   Consent:    Consent obtained:  Verbal   Consent given by:  Patient   Risks discussed:  Allergic reaction, dysrhythmia, inadequate sedation, nausea, prolonged hypoxia resulting in organ damage, prolonged sedation necessitating reversal and respiratory compromise necessitating ventilatory assistance and intubation   Alternatives discussed:  Anxiolysis and analgesia without sedation Universal protocol:    Immediately prior to procedure, a time out was called: yes     Patient identity confirmed:  Hospital-assigned identification number and verbally with patient Indications:    Procedure performed:  Cardioversion   Procedure necessitating sedation performed by:  Physician performing sedation Pre-sedation assessment:    Time since last food or drink:  18hrs   ASA classification: class 2 - patient with mild systemic disease     Mallampati score:  II - soft palate, uvula, fauces visible   Neck mobility: normal     Pre-sedation assessments completed and reviewed: airway patency, cardiovascular function, hydration status, mental status, nausea/vomiting, pain level, respiratory function and temperature   Immediate pre-procedure details:    Reviewed: vital signs   Procedure details (see MAR for exact dosages):    Preoxygenation:  Room air   Sedation:  Etomidate   Intended level of sedation: deep   Analgesia:  None   Intra-procedure monitoring:  Blood pressure monitoring, cardiac monitor, continuous pulse oximetry, continuous capnometry, frequent LOC  assessments and frequent vital sign checks   Intra-procedure events: none     Intra-procedure management:  Airway repositioning   Total Provider sedation time (minutes):  10 Post-procedure details:    Attendance: Constant attendance by certified staff until patient recovered     Recovery: Patient returned to pre-procedure baseline     Post-sedation assessments completed and reviewed: airway patency, cardiovascular function, hydration status, mental status, nausea/vomiting, pain level, respiratory function and temperature     Patient is stable for discharge or admission: yes     Procedure completion:  Tolerated well, no immediate complications   DG Chest [facility] 1 View  Final Result  Julianne Rice, MD Emergency Medicine, PGY-2   Curley Spice, MD 05/28/22 Elder Cyphers, MD 06/01/22 [phone]

## 2022-05-29 ENCOUNTER — Other Ambulatory Visit: Payer: Self-pay

## 2022-05-29 DIAGNOSIS — I4819 Other persistent atrial fibrillation: Secondary | ICD-10-CM | POA: Diagnosis not present

## 2022-05-29 LAB — BASIC METABOLIC PANEL
Anion gap: 11 (ref 5–15)
BUN: 18 mg/dL (ref 8–23)
CO2: 21 mmol/L — ABNORMAL LOW (ref 22–32)
Calcium: 8.9 mg/dL (ref 8.9–10.3)
Chloride: 102 mmol/L (ref 98–111)
Creatinine, Ser: 0.78 mg/dL (ref 0.61–1.24)
GFR, Estimated: >60 mL/min (ref >60)
Glucose, Bld: 129 mg/dL — ABNORMAL HIGH (ref 70–99)
Potassium: 3.6 mmol/L (ref 3.5–5.1)
Sodium: 134 mmol/L — ABNORMAL LOW (ref 135–145)

## 2022-05-29 LAB — MAGNESIUM: Magnesium: 2.2 mg/dL (ref 1.7–2.4)

## 2022-05-29 LAB — GLUCOSE, CAPILLARY
Glucose-Capillary: 187 mg/dL — ABNORMAL HIGH (ref 70–99)
Glucose-Capillary: 148 mg/dL — ABNORMAL HIGH (ref 70–99)

## 2022-05-29 MED ORDER — POTASSIUM CHLORIDE CRYS ER 20 MEQ PO TBCR
40.0000 meq | EXTENDED_RELEASE_TABLET | Freq: Once | ORAL | Status: AC
  Administered 2022-05-29: 40 meq via ORAL
  Filled 2022-05-29: qty 2

## 2022-05-29 MED ORDER — POTASSIUM CHLORIDE CRYS ER 20 MEQ PO TBCR
20.0000 meq | EXTENDED_RELEASE_TABLET | Freq: Once | ORAL | Status: AC
  Administered 2022-05-29: 20 meq via ORAL
  Filled 2022-05-29: qty 1

## 2022-05-29 NOTE — Progress Notes (Signed)
Pharmacy: Dofetilide (Tikosyn) - Follow Up Assessment and Electrolyte Replacement  Pharmacy consulted to assist in monitoring and replacing electrolytes in this 72 y.o. male admitted on 05/28/2022 undergoing dofetilide initiation. Initiated on 500 mcg Q12 hours, first dofetilide dose: 05/28/2022 @ 2139.  Labs:    Component Value Date/Time   K 3.6 05/29/2022 0157   MG 2.2 05/29/2022 0157    Plan: Potassium: K 3.5-3.7:  Give KCl 60 mEq po x1   Magnesium: Mg > 2: No additional supplementation needed  Patient already received 40 mEq of Kcl this AM, will add on an additional 20 mEq x1, for a total of 60 mEq replacement per protocol.   Thank you for involving pharmacy in this patient's care.   Rockwell Alexandria, PharmD PGY1 Pharmacy Resident 05/29/2022 8:44 AM

## 2022-05-29 NOTE — Progress Notes (Signed)
   Rounding Note    Patient Name: Lawrence Jordan Date of Encounter: 05/29/2022  [facility] Cardiologist: None   Subjective   NAEO. Remains in sinus.  Vital Signs    Vitals:   05/28/22 2017 05/28/22 2350 05/29/22 0444 05/29/22 0724  BP: (!) 157/97 (!) 137/97 119/76 (!) 148/75  Pulse: 69 75 65 65  Resp: 18  18 18   Temp: 98.1 F (36.7 C) 98.3 F (36.8 C)  98.6 F (37 C)  TempSrc: Oral Oral  Oral  SpO2: 96% 96% 96% 96%  Weight:      Height:        Intake/Output Summary (Last 24 hours) at 05/29/2022 0955 Last data filed at 05/29/2022 4098 Gross per 24 hour  Intake 3 ml  Output --  Net 3 ml      05/28/2022    2:30 PM 05/28/2022   11:40 AM 05/25/2022    2:53 PM  Last 3 Weights  Weight (lbs) 323 lb 3.2 oz 323 lb 3.2 oz 328 lb 12.8 oz  Weight (kg) 146.603 kg 146.603 kg 149.143 kg      Telemetry    Sinus. Personally Reviewed  ECG    QTc . Sinus rhythm. - Personally Reviewed  Physical Exam    GEN: No acute distress.  Obese. Cardiac: RRR, no murmurs, rubs, or gallops.  Respiratory: Clear to auscultation bilaterally. Psych: Normal affect   Assessment & Plan    #Persistent AF #AFL Back in sinus on dofetilide. Takes eliquis for stroke ppx. QTc remains acceptable for ongoing tikosyn loading.  #Chronic diastolic HF  #HTN Cont home regimen  #OSA Cont CPAP     [name], MD, [facility], [facility] Cardiac Electrophysiology

## 2022-05-29 NOTE — Progress Notes (Signed)
Brief EP Note  QTc and Cr acceptable for continued dofetilide dosing. K repletion ordered.  QTc . Cr 0.78 K 3.6 Mg 2.2  [name], MD, [facility], [facility] Cardiac Electrophysiology

## 2022-05-29 NOTE — Progress Notes (Signed)
EP BRIEF UPDATE NOTE  QTc after AM dose. Hold tonight's dose. Plan to restart 05/29/2021 AM at reduced dose pending pre dose ECG.  [name], MD, [facility], [facility] Cardiac Electrophysiology

## 2022-05-29 NOTE — Progress Notes (Signed)
QTC is 532 ms after 2nd dose. Dr. Lalla Brothers made aware.

## 2022-05-30 ENCOUNTER — Other Ambulatory Visit: Payer: Self-pay

## 2022-05-30 DIAGNOSIS — I4819 Other persistent atrial fibrillation: Secondary | ICD-10-CM | POA: Diagnosis not present

## 2022-05-30 LAB — BASIC METABOLIC PANEL
Anion gap: 8 (ref 5–15)
BUN: 21 mg/dL (ref 8–23)
CO2: 21 mmol/L — ABNORMAL LOW (ref 22–32)
Calcium: 8.7 mg/dL — ABNORMAL LOW (ref 8.9–10.3)
Chloride: 107 mmol/L (ref 98–111)
Creatinine, Ser: 0.72 mg/dL (ref 0.61–1.24)
GFR, Estimated: >60 mL/min (ref >60)
Glucose, Bld: 125 mg/dL — ABNORMAL HIGH (ref 70–99)
Potassium: 3.7 mmol/L (ref 3.5–5.1)
Sodium: 136 mmol/L (ref 135–145)

## 2022-05-30 LAB — GLUCOSE, CAPILLARY
Glucose-Capillary: 137 mg/dL — ABNORMAL HIGH (ref 70–99)
Glucose-Capillary: 118 mg/dL — ABNORMAL HIGH (ref 70–99)

## 2022-05-30 LAB — MAGNESIUM: Magnesium: 2.1 mg/dL (ref 1.7–2.4)

## 2022-05-30 MED ORDER — POTASSIUM CHLORIDE CRYS ER 20 MEQ PO TBCR
60.0000 meq | EXTENDED_RELEASE_TABLET | Freq: Once | ORAL | Status: AC
  Administered 2022-05-30: 60 meq via ORAL
  Filled 2022-05-30: qty 3

## 2022-05-30 MED ORDER — DOFETILIDE 250 MCG PO CAPS
250.0000 ug | ORAL_CAPSULE | Freq: Two times a day (BID) | ORAL | Status: DC
  Administered 2022-05-30 – 2022-06-01 (×5): 250 ug via ORAL
  Filled 2022-05-30 (×5): qty 1

## 2022-05-30 NOTE — Progress Notes (Signed)
   Rounding Note    Patient Name: ARMEND BAUMHARDT Date of Encounter: 05/30/2022  [facility] Cardiologist: None   Subjective   NAEO.  QTc improved this AM.  Vital Signs    Vitals:   05/29/22 1605 05/29/22 2011 05/30/22 0551 05/30/22 0700  BP: 139/86 (!) 156/82 133/83 111/75  Pulse: 96 65 70 71  Resp: 18 18 16 16   Temp: 98.6 F (37 C) 98.8 F (37.1 C) 98.6 F (37 C) 97.7 F (36.5 C)  TempSrc: Oral Oral Oral Axillary  SpO2:  97%    Weight:      Height:       No intake or output data in the 24 hours ending 05/30/22 0940     05/28/2022    2:30 PM 05/28/2022   11:40 AM 05/25/2022    2:53 PM  Last 3 Weights  Weight (lbs) 323 lb 3.2 oz 323 lb 3.2 oz 328 lb 12.8 oz  Weight (kg) 146.603 kg 146.603 kg 149.143 kg      Telemetry    Sinus. Personally Reviewed  ECG    QTc . Sinus rhythm. - Personally Reviewed  Physical Exam    GEN: No acute distress.  Obese. Cardiac: RRR, no murmurs, rubs, or gallops.  Respiratory: Clear to auscultation bilaterally. Psych: Normal affect   Assessment & Plan    #Persistent AF #AFL Back in sinus on dofetilide. Takes eliquis for stroke ppx. QTc remains acceptable for ongoing tikosyn loading. Plan to reduce dose today to .  #Chronic diastolic HF  #HTN Cont home regimen  #OSA Cont CPAP     [name], MD, [facility], [facility] Cardiac Electrophysiology

## 2022-05-30 NOTE — Progress Notes (Signed)
Pharmacy: Dofetilide (Tikosyn) - Follow Up Assessment and Electrolyte Replacement  Pharmacy consulted to assist in monitoring and replacing electrolytes in this 72 y.o. male admitted on 05/28/2022 undergoing dofetilide initiation. Initiated on 500 mcg Q12 hours, first dofetilide dose: 05/28/2022 @ 2139.  Labs:    Component Value Date/Time   K 3.7 05/30/2022 0151   MG 2.1 05/30/2022 0151    05/29/22 PM dose held due to Qtc of 520. Plan to resume this morning pending ECG, will replace potassium per protocol.  Plan: Potassium: K 3.5-3.7:  Give KCl 60 mEq po x1   Magnesium: Mg > 2: No additional supplementation needed  Thank you for involving pharmacy in this patient's care.   Rockwell Alexandria, PharmD PGY1 Pharmacy Resident 05/30/2022 8:44 AM

## 2022-05-30 NOTE — Progress Notes (Signed)
EP BRIEF UPDATE  Post AM dose ECG shows QTc . OK to continue with Dofetilide PO BID.  [name], MD, [facility], [facility] Cardiac Electrophysiology

## 2022-05-31 ENCOUNTER — Encounter (HOSPITAL_COMMUNITY): Payer: Self-pay | Admitting: Anesthesiology

## 2022-05-31 ENCOUNTER — Other Ambulatory Visit (HOSPITAL_COMMUNITY): Payer: Self-pay

## 2022-05-31 ENCOUNTER — Other Ambulatory Visit: Payer: Self-pay

## 2022-05-31 ENCOUNTER — Telehealth (HOSPITAL_COMMUNITY): Payer: Self-pay | Admitting: Pharmacy Technician

## 2022-05-31 DIAGNOSIS — I4819 Other persistent atrial fibrillation: Secondary | ICD-10-CM | POA: Diagnosis not present

## 2022-05-31 LAB — GLUCOSE, CAPILLARY
Glucose-Capillary: 156 mg/dL — ABNORMAL HIGH (ref 70–99)
Glucose-Capillary: 170 mg/dL — ABNORMAL HIGH (ref 70–99)
Glucose-Capillary: 130 mg/dL — ABNORMAL HIGH (ref 70–99)
Glucose-Capillary: 211 mg/dL — ABNORMAL HIGH (ref 70–99)
Glucose-Capillary: 121 mg/dL — ABNORMAL HIGH (ref 70–99)
Glucose-Capillary: 144 mg/dL — ABNORMAL HIGH (ref 70–99)
Glucose-Capillary: 180 mg/dL — ABNORMAL HIGH (ref 70–99)

## 2022-05-31 LAB — BASIC METABOLIC PANEL
Anion gap: 10 (ref 5–15)
BUN: 17 mg/dL (ref 8–23)
CO2: 21 mmol/L — ABNORMAL LOW (ref 22–32)
Calcium: 8.8 mg/dL — ABNORMAL LOW (ref 8.9–10.3)
Chloride: 104 mmol/L (ref 98–111)
Creatinine, Ser: 0.78 mg/dL (ref 0.61–1.24)
GFR, Estimated: >60 mL/min (ref >60)
Glucose, Bld: 132 mg/dL — ABNORMAL HIGH (ref 70–99)
Potassium: 3.5 mmol/L (ref 3.5–5.1)
Sodium: 135 mmol/L (ref 135–145)

## 2022-05-31 LAB — MAGNESIUM: Magnesium: 1.9 mg/dL (ref 1.7–2.4)

## 2022-05-31 MED ORDER — POTASSIUM CHLORIDE CRYS ER 20 MEQ PO TBCR
60.0000 meq | EXTENDED_RELEASE_TABLET | Freq: Once | ORAL | Status: AC
  Administered 2022-05-31: 60 meq via ORAL
  Filled 2022-05-31: qty 3

## 2022-05-31 MED ORDER — MAGNESIUM SULFATE 2 GM/50ML IV SOLN
2.0000 g | Freq: Once | INTRAVENOUS | Status: AC
  Administered 2022-05-31: 2 g via INTRAVENOUS
  Filled 2022-05-31: qty 50

## 2022-05-31 NOTE — Progress Notes (Signed)
EKG from yesterday evening 05/30/2022 reviewed     Shows remains in NSR with stable QTc at ~460-470 ms.  Continue  Tikosyn 250 mcg BID.   Potassium3.5 (05/06 0435) Magnesium  1.9 (05/06 0435) Creatinine, ser  0.78 (05/06 0435)  Plan for home tomorrow if QTc remains stable.   Graciella Freer, PA-C  05/31/2022 12:05 PM

## 2022-05-31 NOTE — Progress Notes (Signed)
Electrophysiology Rounding Note  Patient Name: Lawrence Jordan Date of Encounter: 05/31/2022  Primary Cardiologist: None  Electrophysiologist: [name], MD    Subjective   Pt remains in NSR on Tikosyn 250 mcg BID   QTc from EKG last pm shows stable QTc at  ~470  The patient is doing well today.  At this time, the patient denies chest pain, shortness of breath, or any new concerns.  Inpatient Medications    Scheduled Meds:  apixaban  5 mg Oral BID   calcium carbonate  1,500 mg Oral QHS   dofetilide  250 mcg Oral BID   empagliflozin  25 mg Oral Daily   insulin aspart  0-15 Units Subcutaneous TID WC   losartan  50 mg Oral Daily   metFORMIN  1,000 mg Oral BID WC   metoprolol tartrate  25 mg Oral BID   pantoprazole  40 mg Oral Daily   potassium chloride  60 mEq Oral Once   simvastatin  20 mg Oral q1800   sodium chloride flush  3 mL Intravenous Q12H   Continuous Infusions:  sodium chloride     magnesium sulfate bolus IVPB     PRN Meds: sodium chloride, acetaminophen, midazolam, sodium chloride flush, traMADol   Vital Signs    Vitals:   05/30/22 1710 05/30/22 2036 05/30/22 2310 05/31/22 0401  BP: (!) 152/90 129/85 (!) 155/80 126/79  Pulse:  65 67 66  Resp: 18 18 18 18   Temp: 98.3 F (36.8 C) 98.9 F (37.2 C) 97.8 F (36.6 C) 99.1 F (37.3 C)  TempSrc: Oral Oral Oral Oral  SpO2:      Weight:      Height:        Intake/Output Summary (Last 24 hours) at 05/31/2022 0817 Last data filed at 05/30/2022 2109 Gross per 24 hour  Intake 483 ml  Output 700 ml  Net -217 ml   Filed Weights   05/28/22 1430  Weight: (!) 146.6 kg    Physical Exam    GEN- NAD, A&O x 3. Normal affect.  Lungs- CTAB, Normal effort.  Heart- Regular rate and rhythm. No M/G/R GI- Soft, NT, ND Extremities- No clubbing, cyanosis, or edema Skin- no rash or lesion  Labs    CBC Recent Labs    05/28/22 1509  WBC 6.7  HGB 15.9  HCT 47.4  MCV 92.6  PLT 168   Basic Metabolic  Panel Recent Labs    05/30/22 0151 05/31/22 0435  NA 136 135  K 3.7 3.5  CL 107 104  CO2 21* 21*  GLUCOSE 125* 132*  BUN 21 17  CREATININE 0.72 0.78  CALCIUM 8.7* 8.8*  MG 2.1 1.9    Telemetry    NSR 60s, occasional PVCs (personally reviewed)  Patient Profile     Lawrence Jordan is a 72 y.o. male with a past medical history significant for persistent atrial fibrillation.  They were admitted for tikosyn load.   Assessment & Plan    Persistent atrial fibrillation Atrial flutter Pt remains in NSR on Tikosyn 250 mcg BID  Continue Eliquis Creatinine, ser  0.78 (05/06 0435) Magnesium  1.9 (05/06 0435) Potassium3.5 (05/06 0435) No electrolyte supplementation needed  Plan for home Tomorrow if QTc remains stable.  OSA  Encouraged nightly CPAP   HTN Stable on current regimen   For questions or updates, please contact [facility] Please consult [email or web address] for contact info under Cardiology/STEMI.  Signed, Graciella Freer, PA-C  05/31/2022, 8:17  AM

## 2022-05-31 NOTE — Care Management Important Message (Signed)
Important Message  Patient Details  Name: Lawrence Jordan MRN: [id number] Date of Birth: 1950-08-06   Medicare Important Message Given:  Yes     Renie Ora 05/31/2022, 11:12 AM

## 2022-05-31 NOTE — Care Management (Addendum)
  Transition of Care [facility]) Screening Note   Patient Details  Name: Lawrence Jordan Date of Birth: 10-Aug-1950   Transition of Care [facility]) CM/SW Contact:    Gala Lewandowsky, RN Phone Number: 05/31/2022, 10:15 AM    Transition of Care Department [facility]) has reviewed patient. The patient presented for Tikosyn Load. Benefits check submitted for cost. Case Manager will follow for cost and pharmacy of choice as the patient progresses.   Co pay: $22.93   1621 05-31-22 Case Manager spoke with patient regarding Tikosyn cost. Patient is agreeable to cost. Initial Rx to be filled via [facility] and Rx refills to be escribed to [facility] on [address].

## 2022-05-31 NOTE — Progress Notes (Signed)
Pharmacy: Dofetilide (Tikosyn) - Follow Up Assessment and Electrolyte Replacement  Pharmacy consulted to assist in monitoring and replacing electrolytes in this 72 y.o. male admitted on 05/28/2022 undergoing dofetilide initiation.   Labs:    Component Value Date/Time   K 3.5 05/31/2022 0435   MG 1.9 05/31/2022 0435     Plan: Potassium: -Replaced per MD  Magnesium: Replaced per MD   As patient has required on average 55 mEq of potassium replacement every day. He will likely need a potassium supplement going home -Will follow morning labs   Harland German, PharmD Clinical Pharmacist **Pharmacist phone directory can now be found on [email or web address] ([email or web address]).  Listed under [facility].

## 2022-05-31 NOTE — Plan of Care (Signed)
  Problem: Education: Goal: Ability to describe self-care measures that may prevent or decrease complications (Diabetes Survival Skills Education) will improve Outcome: Progressing Goal: Individualized Educational Video(s) Outcome: Progressing   

## 2022-05-31 NOTE — Telephone Encounter (Signed)
Pharmacy Patient Advocate Encounter  Insurance verification completed.    The patient is insured through [employer] Part D   The patient is currently admitted and ran test claims for the following: dofetilide (Tikosyn).  Copays and coinsurance results were relayed to Inpatient clinical team.   

## 2022-05-31 NOTE — Progress Notes (Signed)
Pharmacy: Dofetilide (Tikosyn) - Follow Up Assessment and Electrolyte Replacement  Pharmacy consulted to assist in monitoring and replacing electrolytes in this 72 y.o. male admitted on 05/28/2022 undergoing dofetilide initiation. Initiated on 500 mcg Q12 hours, first dofetilide dose: 05/28/2022 @ 2139.  Labs:    Component Value Date/Time   K 3.5 05/31/2022 0435   MG 1.9 05/31/2022 0435     Plan: Potassium: K 3.5-3.7:  Give KCl 60 mEq po x1   Magnesium: Mg 1.8-2: Give Mg 2 gm IV x1   Thank you for allowing pharmacy to participate in this patient's care,  Sherron Monday, PharmD, BCCCP Clinical Pharmacist  Phone: [phone] 05/31/2022 7:25 AM  Please check AMION for all [facility] Pharm[facility] phone numbers After 10:00 PM, call Main Pharmacy [phone]

## 2022-05-31 NOTE — Progress Notes (Signed)
Pt had 1 minute of sustained V-tach at 2220 that ended at 2221, strip saved, pt resting comfortably in bed with no pain or shortness of breath. Attempted to notify Okey Dupre, MD via text page.  Bari Edward, RN

## 2022-05-31 NOTE — TOC Benefit Eligibility Note (Signed)
Patient Product/process development scientist completed.    The patient is currently admitted and upon discharge could be taking dofetilide (Tikosyn) 250 mcg capsules.  The current 30 day co-pay is $22.93.   The patient is insured through [employer] Part D   This test claim was processed through [facility]- copay amounts may vary at other pharmacies due to pharmacy/plan contracts, or as the patient moves through the different stages of their insurance plan.  Roland Earl, CPHT Pharmacy Patient Advocate Specialist [facility] Patient Advocate Team Direct Number: [phone]  Fax: [phone]

## 2022-06-01 ENCOUNTER — Ambulatory Visit (INDEPENDENT_AMBULATORY_CARE_PROVIDER_SITE_OTHER): Payer: Medicare Other

## 2022-06-01 ENCOUNTER — Ambulatory Visit (HOSPITAL_COMMUNITY): Admission: RE | Admit: 2022-06-01 | Payer: Medicare Other | Source: Home / Self Care | Admitting: Cardiology

## 2022-06-01 ENCOUNTER — Other Ambulatory Visit: Payer: Self-pay

## 2022-06-01 ENCOUNTER — Other Ambulatory Visit (HOSPITAL_COMMUNITY): Payer: Self-pay

## 2022-06-01 DIAGNOSIS — I495 Sick sinus syndrome: Secondary | ICD-10-CM

## 2022-06-01 DIAGNOSIS — I484 Atypical atrial flutter: Secondary | ICD-10-CM | POA: Diagnosis not present

## 2022-06-01 DIAGNOSIS — I4819 Other persistent atrial fibrillation: Secondary | ICD-10-CM | POA: Diagnosis not present

## 2022-06-01 LAB — BASIC METABOLIC PANEL
Anion gap: 9 (ref 5–15)
BUN: 19 mg/dL (ref 8–23)
CO2: 21 mmol/L — ABNORMAL LOW (ref 22–32)
Calcium: 8.9 mg/dL (ref 8.9–10.3)
Chloride: 104 mmol/L (ref 98–111)
Creatinine, Ser: 0.63 mg/dL (ref 0.61–1.24)
GFR, Estimated: >60 mL/min (ref >60)
Glucose, Bld: 112 mg/dL — ABNORMAL HIGH (ref 70–99)
Potassium: 3.7 mmol/L (ref 3.5–5.1)
Sodium: 134 mmol/L — ABNORMAL LOW (ref 135–145)

## 2022-06-01 LAB — MAGNESIUM: Magnesium: 1.8 mg/dL (ref 1.7–2.4)

## 2022-06-01 LAB — GLUCOSE, CAPILLARY: Glucose-Capillary: 123 mg/dL — ABNORMAL HIGH (ref 70–99)

## 2022-06-01 SURGERY — CARDIOVERSION
Anesthesia: General

## 2022-06-01 MED ORDER — MAGNESIUM OXIDE 400 MG PO TABS
400.0000 mg | ORAL_TABLET | Freq: Every day | ORAL | 6 refills | Status: DC
  Filled 2022-06-01: qty 30, 30d supply, fill #0

## 2022-06-01 MED ORDER — POTASSIUM CHLORIDE CRYS ER 20 MEQ PO TBCR
60.0000 meq | EXTENDED_RELEASE_TABLET | Freq: Once | ORAL | Status: AC
  Administered 2022-06-01: 60 meq via ORAL
  Filled 2022-06-01: qty 3

## 2022-06-01 MED ORDER — MAGNESIUM SULFATE 2 GM/50ML IV SOLN
2.0000 g | Freq: Once | INTRAVENOUS | Status: AC
  Administered 2022-06-01: 2 g via INTRAVENOUS
  Filled 2022-06-01: qty 50

## 2022-06-01 MED ORDER — DOFETILIDE 250 MCG PO CAPS
250.0000 ug | ORAL_CAPSULE | Freq: Two times a day (BID) | ORAL | 6 refills | Status: DC
  Filled 2022-06-01: qty 60, 30d supply, fill #0

## 2022-06-01 MED ORDER — POTASSIUM CHLORIDE CRYS ER 20 MEQ PO TBCR
60.0000 meq | EXTENDED_RELEASE_TABLET | Freq: Every day | ORAL | 3 refills | Status: DC
  Filled 2022-06-01: qty 90, 30d supply, fill #0

## 2022-06-01 NOTE — Progress Notes (Signed)
Discharge instructions (including medications) discussed with and copy provided to patient/caregiver Picked up patients new prescriptions from [facility] and delivered to patient.

## 2022-06-01 NOTE — Progress Notes (Addendum)
Pharmacy: Dofetilide (Tikosyn) - Follow Up Assessment and Electrolyte Replacement  Pharmacy consulted to assist in monitoring and replacing electrolytes in this 72 y.o. male admitted on 05/28/2022 undergoing dofetilide initiation. Initiated on 500 mcg Q12 hours, first dofetilide dose: 05/28/2022 @ 2139.  Labs:    Component Value Date/Time   K 3.7 06/01/2022 0244   MG 1.8 06/01/2022 0244     Plan: Potassium: K 3.5-3.7:  Give KCl 60 mEq po x1   Magnesium: Mg 1.8-2: Give Mg 2 gm IV x1   As patient has required on average ~56 mEq of potassium replacement every day, recommend discharging patient with prescription for:  Potassium chloride 60 mEq  daily  Also required 8g IV total of Magnesium this admission - could consider discharge on Mag-oxide 400 mg daily.   Thank you for allowing pharmacy to participate in this patient's care,  Sherron Monday, PharmD, BCCCP Clinical Pharmacist  Phone: [phone] 06/01/2022 8:01 AM  Please check AMION for all [facility] Pharmacy phone numbers After 10:00 PM, call Main Pharmacy [phone]

## 2022-06-01 NOTE — Plan of Care (Signed)

## 2022-06-01 NOTE — Discharge Summary (Addendum)
ELECTROPHYSIOLOGY PROCEDURE DISCHARGE SUMMARY    Patient ID: Lawrence Jordan,  MRN: [id number], DOB/AGE: 72-Apr-1952 72 y.o.  Admit date: 05/28/2022 Discharge date: 06/01/2022  Primary Care Physician: [name], MD  Primary Cardiologist: None  Electrophysiologist: Dr. Nelly Laurence   Primary Discharge Diagnosis:  Persistent atrial flutter status post Tikosyn loading this admission  Secondary Discharge Diagnosis:  OSA HTN  Allergies  Allergen Reactions   Iodinated Contrast Media Anaphylaxis   Sulfa Antibiotics Anaphylaxis and Rash   Adhesive [Tape] Other (See Comments)    Blisters PAPER TAPE ONLY   Amoxicillin Hives and Other (See Comments)    Has patient had a PCN reaction causing immediate rash, facial/tongue/throat swelling, SOB or lightheadedness with hypotension: No Has patient had a PCN reaction causing severe rash involving mucus membranes or skin necrosis:Yes--blisters around mouth Has patient had a PCN reaction that required hospitalization: No Has patient had a PCN reaction occurring within the last 10 years: Yes If all of the above answers are "NO", then may proceed with Cephalosporin use.      Procedures This Admission:  1.  Tikosyn loading   Brief HPI: Lawrence Jordan is a 72 y.o. male with a past medical history as noted above.  Pt was seen in EP clinic 5/3 with rapid AFL and was directed to the ER where he underwent urgent [facility].  Risks, benefits, and alternatives to Tikosyn were reviewed with the patient who wished to proceed with admission for loading.  Hospital Course:  The patient was admitted and Tikosyn was initiated.  Renal function and electrolytes were followed during the hospitalization.  Their QTc remained stable. The pt was cardioverted in the ED prior to tikosyn initiation. The patients QT prolonged, requiring dose reduction to 250 mcg BID. They were monitored on telemetry up to discharge. On the day of discharge, they were examined by Dr.  Nelly Laurence  who considered them stable for discharge to home.  Follow-up has been arranged with the Atrial Fibrillation clinic in approximately 1 week.   Physical Exam: Vitals:   05/31/22 0401 05/31/22 1256 05/31/22 2013 06/01/22 0601  BP: 126/79 131/86 132/83 121/62  Pulse: 66 70 70 67  Resp: 18 19  18   Temp: 99.1 F (37.3 C) 98.7 F (37.1 C)  98.1 F (36.7 C)  TempSrc: Oral Oral  Oral  SpO2:      Weight:      Height:        GEN- NAD, A&O x 3. Normal affect.  Lungs- CTAB, Normal effort.  Heart- Regular rate and rhythm. No M/G/R GI- Soft, NT, ND Extremities- No clubbing, cyanosis, or edema Skin- no rash or lesion  Labs:   Lab Results  Component Value Date   WBC 6.7 05/28/2022   HGB 15.9 05/28/2022   HCT 47.4 05/28/2022   MCV 92.6 05/28/2022   PLT 168 05/28/2022    Recent Labs  Lab 05/28/22 1507 05/29/22 0157 06/01/22 0244  NA 137   < > 134*  K 3.8   < > 3.7  CL 104   < > 104  CO2 21*   < > 21*  BUN 15   < > 19  CREATININE 0.81   < > 0.63  CALCIUM 9.2   < > 8.9  PROT 7.3  --   --   BILITOT 0.4  --   --   ALKPHOS 51  --   --   ALT 36  --   --   AST  28  --   --   GLUCOSE 134*   < > 112*   < > = values in this interval not displayed.    Discharge Medications:  Allergies as of 06/01/2022       Reactions   Iodinated Contrast Media Anaphylaxis   Sulfa Antibiotics Anaphylaxis, Rash   Adhesive [tape] Other (See Comments)   Blisters PAPER TAPE ONLY   Amoxicillin Hives, Other (See Comments)   Has patient had a PCN reaction causing immediate rash, facial/tongue/throat swelling, SOB or lightheadedness with hypotension: No Has patient had a PCN reaction causing severe rash involving mucus membranes or skin necrosis:Yes--blisters around mouth Has patient had a PCN reaction that required hospitalization: No Has patient had a PCN reaction occurring within the last 10 years: Yes If all of the above answers are "NO", then may proceed with Cephalosporin use.         Medication List     STOP taking these medications    Magnesium 200 MG Tabs       TAKE these medications    acetaminophen 500 MG tablet Commonly known as: TYLENOL Take 1,000 mg by mouth every 8 (eight) hours as needed for moderate pain.   BD Pen Needle Nano 2nd Gen 32G X 4 MM Misc Generic drug: Insulin Pen Needle USE PEN NEEDLE AS INSTRUCTED TO INJECT INSULIN 4 TIMES DAILY. What changed:  how much to take how to take this when to take this   calcium carbonate 750 MG chewable tablet Commonly known as: TUMS EX Chew 1,500 mg by mouth at bedtime.   CALCIUM-D PO Take 1 tablet by mouth daily.   docusate sodium 100 MG capsule Commonly known as: COLACE Take 100 mg by mouth daily.   dofetilide 250 MCG capsule Commonly known as: TIKOSYN Take 1 capsule (250 mcg total) by mouth 2 (two) times daily.   Eliquis 5 MG Tabs tablet Generic drug: apixaban TAKE 1 TABLET BY MOUTH TWICE A DAY   ferrous sulfate 325 (65 FE) MG tablet Take 325 mg by mouth every other day.   furosemide 20 MG tablet Commonly known as: LASIX TAKE 1 TABLET (20 MG TOTAL) BY MOUTH DAILY. MAY TAKE AN EXTRA TABLET DAILY AS NEEDED FOR WEIGHT GAIN   Jardiance 25 MG Tabs tablet Generic drug: empagliflozin TAKE 1 TABLET BY MOUTH EVERY DAY   latanoprost 0.005 % ophthalmic solution Commonly known as: XALATAN Place 1 drop into both eyes at bedtime.   levocetirizine 5 MG tablet Commonly known as: XYZAL Take 5 mg by mouth every evening.   losartan 50 MG tablet Commonly known as: COZAAR Take 50 mg by mouth daily.   magnesium oxide 400 MG tablet Commonly known as: MAG-OX Take 1 tablet (400 mg total) by mouth daily.   metFORMIN 1000 MG tablet Commonly known as: GLUCOPHAGE TAKE 1 TABLET TWICE DAILY WITH FOOD, PLEASE MAKE FOLLOW-UP APPOINTMENT   metoprolol tartrate 25 MG tablet Commonly known as: LOPRESSOR Take 1 tablet (25 mg total) by mouth 2 (two) times daily. May take extra tablet twice daily as  needed for breakthrough afib   multivitamin tablet Take 1 tablet by mouth daily.   OneTouch Delica Lancets Fine Misc USE TO CHECK BLOOD SUGAR 2 TIMES PER DAY dx code E11.9   OneTouch Delica Lancets 33G Misc Use to check blood sugar 2 times daily   OneTouch Verio test strip Generic drug: glucose blood USE ONETOUCH VERIO TEST STRIPS AS INSTRUCTED TO CHECK BLOOD SUGAR 2 TIMES DAILY.DX:E11.65  OneTouch Verio w/Device Kit Use to check blood sugar twice a day   pantoprazole 40 MG tablet Commonly known as: PROTONIX Take one tablet by mouth once daily   potassium chloride SA 20 MEQ tablet Commonly known as: Klor-Con M20 Take 3 tablets (60 mEq total) by mouth daily. What changed: how much to take   sildenafil 100 MG tablet Commonly known as: VIAGRA Take 100 mg by mouth daily as needed.   simvastatin 20 MG tablet Commonly known as: ZOCOR TAKE 1 TABLET BY MOUTH DAILY AT 6 PM   tamsulosin 0.4 MG Caps capsule Commonly known as: FLOMAX Take 0.4 mg by mouth 2 (two) times daily.   tirzepatide 15 MG/0.5ML Pen Commonly known as: MOUNJARO Inject 15 mg into the skin once a week. For diabetes type 2   traMADol 50 MG tablet Commonly known as: ULTRAM Take 50 mg by mouth 3 (three) times daily as needed.   Evaristo Bury FlexTouch 200 UNIT/ML FlexTouch Pen Generic drug: insulin degludec Inject 40 Units into the skin daily. At dinner time What changed: how much to take        Disposition:    Follow-up Information     [facility] Follow up.   Specialty: Cardiology Why: on 5/14 at 330 for post tikosyn follow up Contact information: [address] [id number] [place] [address] [phone]                Duration of Discharge Encounter: Greater than 30 minutes including physician time.  Dustin Flock, PA-C  06/01/2022 9:06 AM

## 2022-06-02 ENCOUNTER — Ambulatory Visit (HOSPITAL_COMMUNITY)
Admission: RE | Admit: 2022-06-02 | Discharge: 2022-06-02 | Disposition: A | Discharge status: Home / Self Care | Payer: Medicare Other | Source: Ambulatory Visit | Attending: Neurosurgery | Admitting: Neurosurgery

## 2022-06-02 DIAGNOSIS — G8929 Other chronic pain: Secondary | ICD-10-CM | POA: Diagnosis not present

## 2022-06-02 DIAGNOSIS — M549 Dorsalgia, unspecified: Secondary | ICD-10-CM | POA: Insufficient documentation

## 2022-06-02 DIAGNOSIS — M545 Low back pain, unspecified: Secondary | ICD-10-CM | POA: Diagnosis not present

## 2022-06-02 DIAGNOSIS — M47816 Spondylosis without myelopathy or radiculopathy, lumbar region: Secondary | ICD-10-CM | POA: Diagnosis not present

## 2022-06-02 DIAGNOSIS — M4316 Spondylolisthesis, lumbar region: Secondary | ICD-10-CM | POA: Diagnosis not present

## 2022-06-02 LAB — CUP PACEART REMOTE DEVICE CHECK
Battery Remaining Longevity: 94 mo
Battery Remaining Percentage: 88 %
Battery Voltage: 3.01 V
Brady Statistic AP VP Percent: 1 %
Brady Statistic AP VS Percent: 94 %
Brady Statistic AS VP Percent: 1 %
Brady Statistic AS VS Percent: 5.3 %
Brady Statistic RA Percent Paced: 89 %
Brady Statistic RV Percent Paced: 1 %
Date Time Interrogation Session: 20240507235754
Implantable Lead Connection Status: 753985
Implantable Lead Connection Status: 753985
Implantable Lead Implant Date: 20221108
Implantable Lead Implant Date: 20221108
Implantable Lead Location: 753859
Implantable Lead Location: 753860
Implantable Pulse Generator Implant Date: 20221108
Lead Channel Impedance Value: 480 Ohm
Lead Channel Impedance Value: 530 Ohm
Lead Channel Pacing Threshold Amplitude: 0.5 V
Lead Channel Pacing Threshold Amplitude: 0.75 V
Lead Channel Pacing Threshold Pulse Width: 0.5 ms
Lead Channel Pacing Threshold Pulse Width: 0.5 ms
Lead Channel Sensing Intrinsic Amplitude: 12 mV
Lead Channel Sensing Intrinsic Amplitude: 3.3 mV
Lead Channel Setting Pacing Amplitude: 2 V
Lead Channel Setting Pacing Amplitude: 2.5 V
Lead Channel Setting Pacing Pulse Width: 0.5 ms
Lead Channel Setting Sensing Sensitivity: 2 mV
Pulse Gen Model: 2272
Pulse Gen Serial Number: 3968031

## 2022-06-04 DIAGNOSIS — Z6841 Body Mass Index (BMI) 40.0 and over, adult: Secondary | ICD-10-CM | POA: Diagnosis not present

## 2022-06-04 DIAGNOSIS — G8929 Other chronic pain: Secondary | ICD-10-CM | POA: Diagnosis not present

## 2022-06-07 ENCOUNTER — Telehealth: Payer: Self-pay

## 2022-06-07 NOTE — Telephone Encounter (Signed)
Pt left a voicemail returning nurse call. His phone number is  [phone].

## 2022-06-07 NOTE — Telephone Encounter (Signed)
Following alert received from [employer] received for HVR event occurred 5/10 @ 23:57, EGM's shows likely flutter with RVR, duration 38sec, mean HR 201.  DCCV 5/3, Eliquis per EPIC. Presenting AF/AFL poor rate control - route to triage for return of atrial arrhythmia.  Attempted to contact patient to assess and send another transmission. No answer, LMTCB.        Lawrence Jordan

## 2022-06-07 NOTE — Telephone Encounter (Signed)
Patient reports mild increased fatigue and shortness of breath Saturday but was not enough for him to feel the need to check his heart rate. Requested patient send a remote transmission to review if he is back in SR or rates controlled. States he will send inn 15-20 minutes.

## 2022-06-08 ENCOUNTER — Ambulatory Visit (HOSPITAL_COMMUNITY)
Admit: 2022-06-08 | Discharge: 2022-06-08 | Disposition: A | Discharge status: Home / Self Care | Payer: Medicare Other | Attending: Physician Assistant | Admitting: Physician Assistant

## 2022-06-08 VITALS — BP 152/82 | HR 69 | Ht 71.0 in | Wt 325.0 lb

## 2022-06-08 DIAGNOSIS — D6859 Other primary thrombophilia: Secondary | ICD-10-CM | POA: Diagnosis not present

## 2022-06-08 DIAGNOSIS — I110 Hypertensive heart disease with heart failure: Secondary | ICD-10-CM | POA: Diagnosis not present

## 2022-06-08 DIAGNOSIS — I5032 Chronic diastolic (congestive) heart failure: Secondary | ICD-10-CM | POA: Diagnosis not present

## 2022-06-08 DIAGNOSIS — E669 Obesity, unspecified: Secondary | ICD-10-CM | POA: Diagnosis not present

## 2022-06-08 DIAGNOSIS — I2510 Atherosclerotic heart disease of native coronary artery without angina pectoris: Secondary | ICD-10-CM | POA: Insufficient documentation

## 2022-06-08 DIAGNOSIS — I4819 Other persistent atrial fibrillation: Secondary | ICD-10-CM | POA: Diagnosis not present

## 2022-06-08 DIAGNOSIS — Z79899 Other long term (current) drug therapy: Secondary | ICD-10-CM | POA: Diagnosis not present

## 2022-06-08 DIAGNOSIS — I4892 Unspecified atrial flutter: Secondary | ICD-10-CM | POA: Insufficient documentation

## 2022-06-08 DIAGNOSIS — Z6841 Body Mass Index (BMI) 40.0 and over, adult: Secondary | ICD-10-CM | POA: Insufficient documentation

## 2022-06-08 DIAGNOSIS — Z5181 Encounter for therapeutic drug level monitoring: Secondary | ICD-10-CM | POA: Diagnosis not present

## 2022-06-08 DIAGNOSIS — G4733 Obstructive sleep apnea (adult) (pediatric): Secondary | ICD-10-CM | POA: Insufficient documentation

## 2022-06-08 DIAGNOSIS — D6869 Other thrombophilia: Secondary | ICD-10-CM | POA: Diagnosis not present

## 2022-06-08 LAB — MAGNESIUM: Magnesium: 2 mg/dL (ref 1.7–2.4)

## 2022-06-08 LAB — BASIC METABOLIC PANEL
Anion gap: 13 (ref 5–15)
BUN: 14 mg/dL (ref 8–23)
CO2: 24 mmol/L (ref 22–32)
Calcium: 10.3 mg/dL (ref 8.9–10.3)
Chloride: 98 mmol/L (ref 98–111)
Creatinine, Ser: 0.72 mg/dL (ref 0.61–1.24)
GFR, Estimated: >60 mL/min (ref >60)
Glucose, Bld: 105 mg/dL — ABNORMAL HIGH (ref 70–99)
Potassium: 4.1 mmol/L (ref 3.5–5.1)
Sodium: 135 mmol/L (ref 135–145)

## 2022-06-08 MED ORDER — POTASSIUM CHLORIDE CRYS ER 20 MEQ PO TBCR: 60.0000 meq | EXTENDED_RELEASE_TABLET | Freq: Every day | ORAL | 3 refills | Status: DC

## 2022-06-08 MED ORDER — MAGNESIUM OXIDE 400 MG PO TABS: 400.0000 mg | ORAL_TABLET | Freq: Every day | ORAL | 3 refills | Status: DC

## 2022-06-08 MED ORDER — DOFETILIDE 250 MCG PO CAPS: 250.0000 ug | ORAL_CAPSULE | Freq: Two times a day (BID) | ORAL | 3 refills | Status: DC
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
## 2022-06-08 NOTE — Telephone Encounter (Signed)
Remote transmission received.   Patient is now back in SR.

## 2022-06-09 DIAGNOSIS — M545 Low back pain, unspecified: Secondary | ICD-10-CM | POA: Diagnosis not present

## 2022-06-09 DIAGNOSIS — G8929 Other chronic pain: Secondary | ICD-10-CM | POA: Diagnosis not present

## 2022-06-09 DIAGNOSIS — R262 Difficulty in walking, not elsewhere classified: Secondary | ICD-10-CM | POA: Diagnosis not present

## 2022-06-09 DIAGNOSIS — M6281 Muscle weakness (generalized): Secondary | ICD-10-CM | POA: Diagnosis not present

## 2022-06-13 DIAGNOSIS — M546 Pain in thoracic spine: Secondary | ICD-10-CM | POA: Diagnosis not present

## 2022-06-13 DIAGNOSIS — M545 Low back pain, unspecified: Secondary | ICD-10-CM | POA: Diagnosis not present

## 2022-06-13 DIAGNOSIS — M6281 Muscle weakness (generalized): Secondary | ICD-10-CM | POA: Diagnosis not present

## 2022-06-13 DIAGNOSIS — M25562 Pain in left knee: Secondary | ICD-10-CM | POA: Diagnosis not present

## 2022-06-14 ENCOUNTER — Encounter: Payer: Self-pay | Admitting: Endocrinology

## 2022-06-15 ENCOUNTER — Ambulatory Visit (HOSPITAL_COMMUNITY): Payer: Medicare Other | Admitting: Internal Medicine

## 2022-06-15 DIAGNOSIS — M545 Low back pain, unspecified: Secondary | ICD-10-CM | POA: Diagnosis not present

## 2022-06-15 DIAGNOSIS — M6281 Muscle weakness (generalized): Secondary | ICD-10-CM | POA: Diagnosis not present

## 2022-06-15 DIAGNOSIS — R262 Difficulty in walking, not elsewhere classified: Secondary | ICD-10-CM | POA: Diagnosis not present

## 2022-06-15 DIAGNOSIS — G8929 Other chronic pain: Secondary | ICD-10-CM | POA: Diagnosis not present

## 2022-06-22 DIAGNOSIS — M6281 Muscle weakness (generalized): Secondary | ICD-10-CM | POA: Diagnosis not present

## 2022-06-22 DIAGNOSIS — M25562 Pain in left knee: Secondary | ICD-10-CM | POA: Diagnosis not present

## 2022-06-22 DIAGNOSIS — M545 Low back pain, unspecified: Secondary | ICD-10-CM | POA: Diagnosis not present

## 2022-06-22 DIAGNOSIS — M546 Pain in thoracic spine: Secondary | ICD-10-CM | POA: Diagnosis not present

## 2022-06-23 NOTE — Progress Notes (Signed)
Remote pacemaker transmission.   

## 2022-06-24 DIAGNOSIS — M546 Pain in thoracic spine: Secondary | ICD-10-CM | POA: Diagnosis not present

## 2022-06-24 DIAGNOSIS — S233XXA Sprain of ligaments of thoracic spine, initial encounter: Secondary | ICD-10-CM | POA: Diagnosis not present

## 2022-06-24 DIAGNOSIS — M9902 Segmental and somatic dysfunction of thoracic region: Secondary | ICD-10-CM | POA: Diagnosis not present

## 2022-06-24 DIAGNOSIS — S335XXA Sprain of ligaments of lumbar spine, initial encounter: Secondary | ICD-10-CM | POA: Diagnosis not present

## 2022-06-24 DIAGNOSIS — M6281 Muscle weakness (generalized): Secondary | ICD-10-CM | POA: Diagnosis not present

## 2022-06-24 DIAGNOSIS — M545 Low back pain, unspecified: Secondary | ICD-10-CM | POA: Diagnosis not present

## 2022-06-24 DIAGNOSIS — M25562 Pain in left knee: Secondary | ICD-10-CM | POA: Diagnosis not present

## 2022-06-28 ENCOUNTER — Encounter: Payer: Self-pay | Admitting: Cardiovascular Disease

## 2022-06-28 ENCOUNTER — Other Ambulatory Visit: Payer: Self-pay

## 2022-06-28 MED ORDER — TIRZEPATIDE 15 MG/0.5ML ~~LOC~~ SOAJ: 15.0000 mg | SUBCUTANEOUS | 1 refills | Status: DC

## 2022-06-29 DIAGNOSIS — S335XXA Sprain of ligaments of lumbar spine, initial encounter: Secondary | ICD-10-CM | POA: Diagnosis not present

## 2022-06-29 DIAGNOSIS — M6281 Muscle weakness (generalized): Secondary | ICD-10-CM | POA: Diagnosis not present

## 2022-06-29 DIAGNOSIS — M9902 Segmental and somatic dysfunction of thoracic region: Secondary | ICD-10-CM | POA: Diagnosis not present

## 2022-06-29 DIAGNOSIS — S233XXA Sprain of ligaments of thoracic spine, initial encounter: Secondary | ICD-10-CM | POA: Diagnosis not present

## 2022-06-29 DIAGNOSIS — M25562 Pain in left knee: Secondary | ICD-10-CM | POA: Diagnosis not present

## 2022-06-29 DIAGNOSIS — M545 Low back pain, unspecified: Secondary | ICD-10-CM | POA: Diagnosis not present

## 2022-06-29 DIAGNOSIS — M546 Pain in thoracic spine: Secondary | ICD-10-CM | POA: Diagnosis not present

## 2022-07-01 DIAGNOSIS — M6281 Muscle weakness (generalized): Secondary | ICD-10-CM | POA: Diagnosis not present

## 2022-07-01 DIAGNOSIS — M9902 Segmental and somatic dysfunction of thoracic region: Secondary | ICD-10-CM | POA: Diagnosis not present

## 2022-07-01 DIAGNOSIS — S233XXA Sprain of ligaments of thoracic spine, initial encounter: Secondary | ICD-10-CM | POA: Diagnosis not present

## 2022-07-01 DIAGNOSIS — S335XXA Sprain of ligaments of lumbar spine, initial encounter: Secondary | ICD-10-CM | POA: Diagnosis not present

## 2022-07-01 DIAGNOSIS — M546 Pain in thoracic spine: Secondary | ICD-10-CM | POA: Diagnosis not present

## 2022-07-01 DIAGNOSIS — M25562 Pain in left knee: Secondary | ICD-10-CM | POA: Diagnosis not present

## 2022-07-01 DIAGNOSIS — M545 Low back pain, unspecified: Secondary | ICD-10-CM | POA: Diagnosis not present

## 2022-07-06 DIAGNOSIS — M9902 Segmental and somatic dysfunction of thoracic region: Secondary | ICD-10-CM | POA: Diagnosis not present

## 2022-07-06 DIAGNOSIS — G8929 Other chronic pain: Secondary | ICD-10-CM | POA: Diagnosis not present

## 2022-07-06 DIAGNOSIS — R262 Difficulty in walking, not elsewhere classified: Secondary | ICD-10-CM | POA: Diagnosis not present

## 2022-07-06 DIAGNOSIS — S335XXA Sprain of ligaments of lumbar spine, initial encounter: Secondary | ICD-10-CM | POA: Diagnosis not present

## 2022-07-06 DIAGNOSIS — S233XXA Sprain of ligaments of thoracic spine, initial encounter: Secondary | ICD-10-CM | POA: Diagnosis not present

## 2022-07-06 DIAGNOSIS — M545 Low back pain, unspecified: Secondary | ICD-10-CM | POA: Diagnosis not present

## 2022-07-06 DIAGNOSIS — M6281 Muscle weakness (generalized): Secondary | ICD-10-CM | POA: Diagnosis not present

## 2022-07-07 DIAGNOSIS — R262 Difficulty in walking, not elsewhere classified: Secondary | ICD-10-CM | POA: Diagnosis not present

## 2022-07-07 DIAGNOSIS — M6281 Muscle weakness (generalized): Secondary | ICD-10-CM | POA: Diagnosis not present

## 2022-07-07 DIAGNOSIS — G8929 Other chronic pain: Secondary | ICD-10-CM | POA: Diagnosis not present

## 2022-07-07 DIAGNOSIS — M545 Low back pain, unspecified: Secondary | ICD-10-CM | POA: Diagnosis not present

## 2022-07-08 ENCOUNTER — Ambulatory Visit (HOSPITAL_COMMUNITY): Payer: Medicare Other | Admitting: Internal Medicine

## 2022-07-08 ENCOUNTER — Ambulatory Visit (HOSPITAL_COMMUNITY)
Admission: RE | Admit: 2022-07-08 | Discharge: 2022-07-08 | Disposition: A | Discharge status: Home / Self Care | Payer: Medicare Other | Source: Ambulatory Visit | Attending: Internal Medicine | Admitting: Internal Medicine

## 2022-07-08 ENCOUNTER — Encounter (HOSPITAL_COMMUNITY): Payer: Self-pay | Admitting: Internal Medicine

## 2022-07-08 VITALS — BP 124/74 | HR 65 | Ht 71.0 in | Wt 324.6 lb

## 2022-07-08 DIAGNOSIS — I4819 Other persistent atrial fibrillation: Secondary | ICD-10-CM | POA: Insufficient documentation

## 2022-07-08 DIAGNOSIS — K219 Gastro-esophageal reflux disease without esophagitis: Secondary | ICD-10-CM | POA: Diagnosis not present

## 2022-07-08 DIAGNOSIS — I5032 Chronic diastolic (congestive) heart failure: Secondary | ICD-10-CM | POA: Diagnosis not present

## 2022-07-08 DIAGNOSIS — I2510 Atherosclerotic heart disease of native coronary artery without angina pectoris: Secondary | ICD-10-CM | POA: Insufficient documentation

## 2022-07-08 DIAGNOSIS — Z794 Long term (current) use of insulin: Secondary | ICD-10-CM | POA: Insufficient documentation

## 2022-07-08 DIAGNOSIS — E785 Hyperlipidemia, unspecified: Secondary | ICD-10-CM | POA: Insufficient documentation

## 2022-07-08 DIAGNOSIS — G4733 Obstructive sleep apnea (adult) (pediatric): Secondary | ICD-10-CM | POA: Diagnosis not present

## 2022-07-08 DIAGNOSIS — I4892 Unspecified atrial flutter: Secondary | ICD-10-CM | POA: Insufficient documentation

## 2022-07-08 DIAGNOSIS — Z7984 Long term (current) use of oral hypoglycemic drugs: Secondary | ICD-10-CM | POA: Insufficient documentation

## 2022-07-08 DIAGNOSIS — Z7901 Long term (current) use of anticoagulants: Secondary | ICD-10-CM | POA: Diagnosis not present

## 2022-07-08 DIAGNOSIS — E669 Obesity, unspecified: Secondary | ICD-10-CM | POA: Insufficient documentation

## 2022-07-08 DIAGNOSIS — I110 Hypertensive heart disease with heart failure: Secondary | ICD-10-CM | POA: Diagnosis not present

## 2022-07-08 DIAGNOSIS — Z5181 Encounter for therapeutic drug level monitoring: Secondary | ICD-10-CM

## 2022-07-08 DIAGNOSIS — Z79899 Other long term (current) drug therapy: Secondary | ICD-10-CM | POA: Insufficient documentation

## 2022-07-08 DIAGNOSIS — E118 Type 2 diabetes mellitus with unspecified complications: Secondary | ICD-10-CM | POA: Insufficient documentation

## 2022-07-08 DIAGNOSIS — Z6841 Body Mass Index (BMI) 40.0 and over, adult: Secondary | ICD-10-CM | POA: Insufficient documentation

## 2022-07-08 DIAGNOSIS — D6869 Other thrombophilia: Secondary | ICD-10-CM | POA: Insufficient documentation

## 2022-07-08 LAB — BASIC METABOLIC PANEL
Anion gap: 12 (ref 5–15)
BUN: 14 mg/dL (ref 8–23)
CO2: 24 mmol/L (ref 22–32)
Calcium: 9.5 mg/dL (ref 8.9–10.3)
Chloride: 101 mmol/L (ref 98–111)
Creatinine, Ser: 0.85 mg/dL (ref 0.61–1.24)
GFR, Estimated: >60 mL/min (ref >60)
Glucose, Bld: 270 mg/dL — ABNORMAL HIGH (ref 70–99)
Potassium: 4.6 mmol/L (ref 3.5–5.1)
Sodium: 137 mmol/L (ref 135–145)

## 2022-07-08 LAB — MAGNESIUM: Magnesium: 2.1 mg/dL (ref 1.7–2.4)
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
## 2022-07-19 DIAGNOSIS — H02125 Mechanical ectropion of left lower eyelid: Secondary | ICD-10-CM | POA: Diagnosis not present

## 2022-07-19 DIAGNOSIS — H401222 Low-tension glaucoma, left eye, moderate stage: Secondary | ICD-10-CM | POA: Diagnosis not present

## 2022-07-19 DIAGNOSIS — H25813 Combined forms of age-related cataract, bilateral: Secondary | ICD-10-CM | POA: Diagnosis not present

## 2022-07-19 DIAGNOSIS — H401213 Low-tension glaucoma, right eye, severe stage: Secondary | ICD-10-CM | POA: Diagnosis not present

## 2022-07-19 DIAGNOSIS — H04123 Dry eye syndrome of bilateral lacrimal glands: Secondary | ICD-10-CM | POA: Diagnosis not present

## 2022-07-20 DIAGNOSIS — M170 Bilateral primary osteoarthritis of knee: Secondary | ICD-10-CM | POA: Diagnosis not present

## 2022-07-21 ENCOUNTER — Ambulatory Visit (INDEPENDENT_AMBULATORY_CARE_PROVIDER_SITE_OTHER): Payer: Medicare Other | Admitting: Endocrinology

## 2022-07-21 VITALS — BP 132/78 | HR 67 | Ht 71.0 in | Wt 325.2 lb

## 2022-07-21 DIAGNOSIS — E1169 Type 2 diabetes mellitus with other specified complication: Secondary | ICD-10-CM

## 2022-07-21 DIAGNOSIS — I10 Essential (primary) hypertension: Secondary | ICD-10-CM

## 2022-07-21 DIAGNOSIS — Z794 Long term (current) use of insulin: Secondary | ICD-10-CM | POA: Diagnosis not present

## 2022-07-21 DIAGNOSIS — R300 Dysuria: Secondary | ICD-10-CM | POA: Diagnosis not present

## 2022-07-21 DIAGNOSIS — Z20828 Contact with and (suspected) exposure to other viral communicable diseases: Secondary | ICD-10-CM | POA: Diagnosis not present

## 2022-07-21 DIAGNOSIS — R059 Cough, unspecified: Secondary | ICD-10-CM | POA: Diagnosis not present

## 2022-07-21 DIAGNOSIS — E1165 Type 2 diabetes mellitus with hyperglycemia: Secondary | ICD-10-CM

## 2022-07-21 DIAGNOSIS — E669 Obesity, unspecified: Secondary | ICD-10-CM

## 2022-07-21 DIAGNOSIS — Z6841 Body Mass Index (BMI) 40.0 and over, adult: Secondary | ICD-10-CM | POA: Diagnosis not present

## 2022-07-21 DIAGNOSIS — R030 Elevated blood-pressure reading, without diagnosis of hypertension: Secondary | ICD-10-CM | POA: Diagnosis not present

## 2022-07-21 LAB — URINALYSIS, ROUTINE W REFLEX MICROSCOPIC
Bilirubin Urine: NEGATIVE
Hgb urine dipstick: NEGATIVE
Ketones, ur: NEGATIVE
Leukocytes,Ua: NEGATIVE
Nitrite: NEGATIVE
Specific Gravity, Urine: 1.01 (ref 1.000–1.030)
Total Protein, Urine: NEGATIVE
Urine Glucose: >=1000 — AB
Urobilinogen, UA: 0.2 (ref 0.0–1.0)
pH: 6 (ref 5.0–8.0)

## 2022-07-21 LAB — POCT GLYCOSYLATED HEMOGLOBIN (HGB A1C): Hemoglobin A1C: 8.6 % — AB (ref 4.0–5.6)

## 2022-07-21 LAB — MICROALBUMIN / CREATININE URINE RATIO
Creatinine,U: 33.3 mg/dL
Microalb Creat Ratio: 3.6 mg/g (ref 0.0–30.0)
Microalb, Ur: 1.2 mg/dL (ref 0.0–1.9)

## 2022-07-21 MED ORDER — NYSTATIN 100000 UNIT/GM EX CREA: 1.0000 | TOPICAL_CREAM | Freq: Two times a day (BID) | CUTANEOUS | 0 refills | Status: DC

## 2022-07-21 MED ORDER — OZEMPIC (1 MG/DOSE) 4 MG/3ML ~~LOC~~ SOPN: PEN_INJECTOR | SUBCUTANEOUS | 0 refills | Status: DC

## 2022-07-21 MED ORDER — OZEMPIC (2 MG/DOSE) 8 MG/3ML ~~LOC~~ SOPN: PEN_INJECTOR | SUBCUTANEOUS | 1 refills | Status: DC
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
## 2022-07-25 DIAGNOSIS — I4891 Unspecified atrial fibrillation: Secondary | ICD-10-CM | POA: Diagnosis not present

## 2022-07-25 DIAGNOSIS — E782 Mixed hyperlipidemia: Secondary | ICD-10-CM | POA: Diagnosis not present

## 2022-07-25 DIAGNOSIS — E1165 Type 2 diabetes mellitus with hyperglycemia: Secondary | ICD-10-CM | POA: Diagnosis not present

## 2022-07-25 DIAGNOSIS — G8929 Other chronic pain: Secondary | ICD-10-CM | POA: Diagnosis not present

## 2022-07-29 ENCOUNTER — Other Ambulatory Visit: Payer: Self-pay | Admitting: Gastroenterology

## 2022-07-29 DIAGNOSIS — K219 Gastro-esophageal reflux disease without esophagitis: Secondary | ICD-10-CM

## 2022-07-29 DIAGNOSIS — K2970 Gastritis, unspecified, without bleeding: Secondary | ICD-10-CM

## 2022-08-11 ENCOUNTER — Encounter: Payer: Self-pay | Admitting: Podiatry

## 2022-08-11 ENCOUNTER — Ambulatory Visit (INDEPENDENT_AMBULATORY_CARE_PROVIDER_SITE_OTHER): Payer: Medicare Other | Admitting: Podiatry

## 2022-08-11 DIAGNOSIS — Z794 Long term (current) use of insulin: Secondary | ICD-10-CM

## 2022-08-11 DIAGNOSIS — M79674 Pain in right toe(s): Secondary | ICD-10-CM

## 2022-08-11 DIAGNOSIS — B351 Tinea unguium: Secondary | ICD-10-CM

## 2022-08-11 DIAGNOSIS — E1165 Type 2 diabetes mellitus with hyperglycemia: Secondary | ICD-10-CM

## 2022-08-11 DIAGNOSIS — Z6841 Body Mass Index (BMI) 40.0 and over, adult: Secondary | ICD-10-CM | POA: Diagnosis not present

## 2022-08-11 DIAGNOSIS — R030 Elevated blood-pressure reading, without diagnosis of hypertension: Secondary | ICD-10-CM | POA: Diagnosis not present

## 2022-08-11 DIAGNOSIS — R252 Cramp and spasm: Secondary | ICD-10-CM | POA: Diagnosis not present

## 2022-08-11 NOTE — Progress Notes (Signed)
This patient returns to my office for at risk foot care.  This patient requires this care by a professional since this patient will be at risk due to having diabetes.  This patient is unable to cut nails himself since the patient cannot reach his nails.These nails are painful walking and wearing shoes.  This patient presents for at risk foot care today.  General Appearance  Alert, conversant and in no acute stress.  Vascular  Dorsalis pedis and posterior tibial  pulses are palpable  bilaterally.  Capillary return is within normal limits  bilaterally. Temperature is within normal limits  bilaterally.  Neurologic  Senn-Weinstein monofilament wire test within normal limits  bilaterally. Muscle power within normal limits bilaterally.  Nails Thick disfigured discolored nails with subungual debris  from hallux to fifth toes right foot. No evidence of bacterial infection or drainage bilaterally. Absent 1,2 left toenails.  Orthopedic  No limitations of motion  feet .  No crepitus or effusions noted.  No bony pathology or digital deformities noted.  Skin  normotropic skin with no porokeratosis noted bilaterally.  No signs of infections or ulcers noted.     Onychomycosis  Pain in right toes    Consent was obtained for treatment procedures.   Mechanical debridement of nails 1-5  right and 3-5 left foot. performed with a nail nipper.  Filed with dremel without incident.    Return office visit                     Told patient to return for periodic foot care and evaluation due to potential at risk complications.   Helane Gunther DPM

## 2022-08-13 ENCOUNTER — Other Ambulatory Visit: Payer: Self-pay | Admitting: Cardiology

## 2022-08-13 DIAGNOSIS — I4819 Other persistent atrial fibrillation: Secondary | ICD-10-CM

## 2022-08-13 NOTE — Telephone Encounter (Signed)
Eliquis 5mg  refill request received. Patient is 72 years old, weight-147.5kg, Crea-0.85 on 07/08/22, Diagnosis-Afib, and last seen by [place] on 07/08/22. Dose is appropriate based on dosing criteria. Will send in refill to requested pharmacy.

## 2022-08-18 ENCOUNTER — Other Ambulatory Visit: Payer: Self-pay | Admitting: Endocrinology

## 2022-08-18 DIAGNOSIS — E1165 Type 2 diabetes mellitus with hyperglycemia: Secondary | ICD-10-CM

## 2022-08-20 ENCOUNTER — Telehealth: Payer: Self-pay | Admitting: Endocrinology

## 2022-08-20 DIAGNOSIS — E1165 Type 2 diabetes mellitus with hyperglycemia: Secondary | ICD-10-CM

## 2022-08-20 MED ORDER — BD PEN NEEDLE NANO 2ND GEN 32G X 4 MM MISC: 4 refills | Status: DC

## 2022-08-20 NOTE — Telephone Encounter (Signed)
Done and patient has been advised

## 2022-08-20 NOTE — Telephone Encounter (Signed)
Patient advising he need needles for his NANO 32mm needles ASAP.  PT states the pharmacy called but has gotten no response. Please call patient when done([facility],[place])

## 2022-08-31 ENCOUNTER — Ambulatory Visit: Payer: Medicare Other

## 2022-08-31 DIAGNOSIS — I495 Sick sinus syndrome: Secondary | ICD-10-CM | POA: Diagnosis not present

## 2022-09-01 ENCOUNTER — Other Ambulatory Visit: Payer: Self-pay | Admitting: Endocrinology

## 2022-09-02 ENCOUNTER — Ambulatory Visit: Payer: Medicare Other | Admitting: Endocrinology

## 2022-09-02 ENCOUNTER — Other Ambulatory Visit (HOSPITAL_COMMUNITY): Payer: Self-pay | Admitting: Physician Assistant

## 2022-09-06 DIAGNOSIS — E7801 Familial hypercholesterolemia: Secondary | ICD-10-CM | POA: Diagnosis not present

## 2022-09-06 DIAGNOSIS — E1169 Type 2 diabetes mellitus with other specified complication: Secondary | ICD-10-CM | POA: Diagnosis not present

## 2022-09-06 DIAGNOSIS — E559 Vitamin D deficiency, unspecified: Secondary | ICD-10-CM | POA: Diagnosis not present

## 2022-09-06 DIAGNOSIS — Z1329 Encounter for screening for other suspected endocrine disorder: Secondary | ICD-10-CM | POA: Diagnosis not present

## 2022-09-06 DIAGNOSIS — E1165 Type 2 diabetes mellitus with hyperglycemia: Secondary | ICD-10-CM | POA: Diagnosis not present

## 2022-09-09 ENCOUNTER — Telehealth: Payer: Self-pay

## 2022-09-09 MED ORDER — METOPROLOL SUCCINATE ER 25 MG PO TB24: 25.0000 mg | ORAL_TABLET | Freq: Every day | ORAL | 3 refills | Status: DC

## 2022-09-09 NOTE — Telephone Encounter (Signed)
Spoke with patient, he has not been taking the metoprolol twice daily as stated in his MAR. Instructed patient of Dr Morrie Sheldon findings on interrogation and medication changes. Sent in prescription for metoprolol succinate 25 mg once daily. Patient agrees with changes, no questions at this time.    Mealor, Roberts Gaudy, MD  Festus Holts, RN His last interrogation showed several episodes that look like atrial flutter, sometimes conducting 1:1 (rates > 200 bpm). His metoprolol is listed as being PRN for breakthrough. I think we need to get him on scheduled metop if he's not on it. Can start with XL 25 daily. If's he's already taking it scheduled, we should bump it up to the next dose. Thx! Have a good day!

## 2022-09-15 NOTE — Progress Notes (Signed)
Remote pacemaker transmission.   

## 2022-09-16 DIAGNOSIS — Z6841 Body Mass Index (BMI) 40.0 and over, adult: Secondary | ICD-10-CM | POA: Diagnosis not present

## 2022-09-16 DIAGNOSIS — R531 Weakness: Secondary | ICD-10-CM | POA: Diagnosis not present

## 2022-09-16 DIAGNOSIS — K922 Gastrointestinal hemorrhage, unspecified: Secondary | ICD-10-CM | POA: Diagnosis not present

## 2022-09-16 DIAGNOSIS — E1169 Type 2 diabetes mellitus with other specified complication: Secondary | ICD-10-CM | POA: Diagnosis not present

## 2022-09-16 DIAGNOSIS — R0602 Shortness of breath: Secondary | ICD-10-CM | POA: Diagnosis not present

## 2022-09-16 DIAGNOSIS — I4891 Unspecified atrial fibrillation: Secondary | ICD-10-CM | POA: Diagnosis not present

## 2022-09-16 DIAGNOSIS — E7801 Familial hypercholesterolemia: Secondary | ICD-10-CM | POA: Diagnosis not present

## 2022-09-16 DIAGNOSIS — Z1329 Encounter for screening for other suspected endocrine disorder: Secondary | ICD-10-CM | POA: Diagnosis not present

## 2022-09-16 DIAGNOSIS — R030 Elevated blood-pressure reading, without diagnosis of hypertension: Secondary | ICD-10-CM | POA: Diagnosis not present

## 2022-09-16 DIAGNOSIS — Z23 Encounter for immunization: Secondary | ICD-10-CM | POA: Diagnosis not present

## 2022-09-21 DIAGNOSIS — R0981 Nasal congestion: Secondary | ICD-10-CM | POA: Diagnosis not present

## 2022-09-21 DIAGNOSIS — R030 Elevated blood-pressure reading, without diagnosis of hypertension: Secondary | ICD-10-CM | POA: Diagnosis not present

## 2022-09-21 DIAGNOSIS — J029 Acute pharyngitis, unspecified: Secondary | ICD-10-CM | POA: Diagnosis not present

## 2022-09-21 DIAGNOSIS — R3915 Urgency of urination: Secondary | ICD-10-CM | POA: Diagnosis not present

## 2022-09-21 DIAGNOSIS — J302 Other seasonal allergic rhinitis: Secondary | ICD-10-CM | POA: Diagnosis not present

## 2022-09-21 DIAGNOSIS — Z6841 Body Mass Index (BMI) 40.0 and over, adult: Secondary | ICD-10-CM | POA: Diagnosis not present

## 2022-09-21 DIAGNOSIS — N529 Male erectile dysfunction, unspecified: Secondary | ICD-10-CM | POA: Diagnosis not present

## 2022-09-21 DIAGNOSIS — R3912 Poor urinary stream: Secondary | ICD-10-CM | POA: Diagnosis not present

## 2022-09-29 ENCOUNTER — Ambulatory Visit (INDEPENDENT_AMBULATORY_CARE_PROVIDER_SITE_OTHER): Payer: Medicare Other | Admitting: Endocrinology

## 2022-09-29 ENCOUNTER — Encounter: Payer: Self-pay | Admitting: Endocrinology

## 2022-09-29 VITALS — BP 140/85 | HR 80 | Ht 71.0 in | Wt 330.4 lb

## 2022-09-29 DIAGNOSIS — N400 Enlarged prostate without lower urinary tract symptoms: Secondary | ICD-10-CM | POA: Diagnosis not present

## 2022-09-29 DIAGNOSIS — E1165 Type 2 diabetes mellitus with hyperglycemia: Secondary | ICD-10-CM

## 2022-09-29 DIAGNOSIS — B379 Candidiasis, unspecified: Secondary | ICD-10-CM | POA: Diagnosis not present

## 2022-09-29 DIAGNOSIS — N419 Inflammatory disease of prostate, unspecified: Secondary | ICD-10-CM | POA: Diagnosis not present

## 2022-09-29 DIAGNOSIS — Z794 Long term (current) use of insulin: Secondary | ICD-10-CM | POA: Diagnosis not present

## 2022-09-29 MED ORDER — OZEMPIC (2 MG/DOSE) 8 MG/3ML ~~LOC~~ SOPN: PEN_INJECTOR | SUBCUTANEOUS | 3 refills | Status: DC
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
## 2022-09-29 NOTE — Patient Instructions (Addendum)
Stop jardiance due to yeast infection.  Increase Ozempic to 2 mg weekly. Continue metformin 1000 mg two times a day.  Continue Tresiba 64 units daily.   If you notice high sugar call our clinic.   Empagliflozin Tablets What is this medication? EMPAGLIFLOZIN (EM pa gli FLOE zin) treats type 2 diabetes. It works by helping your kidneys remove sugar (glucose) from your blood through the urine, which decreases your blood sugar.  It may also be used to lower the risk of worsening disease and death caused by kidney disease and heart failure. It works by helping your kidneys remove salt (sodium) from your blood through the urine. This decreases the amount of work the kidneys and heart have to do. Changes to diet and exercise are often combined with this medication. This medicine may be used for other purposes; ask your health care provider or pharmacist if you have questions. COMMON BRAND NAME(S): Jardiance What should I tell my care team before I take this medication? They need to know if you have any of these conditions: Change in diet, eating less Changes to your insulin dose Dehydration Diet low in salt Frequently drink alcohol Having surgery History of amputation History of diabetic ketoacidosis (DKA) History of foot sores caused by diabetes History of genital infections History of pancreatitis or pancreas problems History of urinary tract infections (UTI) Kidney disease Liver disease Nerve condition that causes pain, tingling, or numbness in the hands or feet Peripheral vascular disease, narrowing of the blood vessels Serious infection Trouble passing urine Type 1 diabetes An unusual or allergic reaction to empagliflozin, other medications, foods, dyes, or preservatives Pregnant or trying to get pregnant Breastfeeding How should I use this medication? Take this medication by mouth with water. Take it as directed on the prescription label at the same time every day. You may take it  with or without food. Keep taking it unless your care team tells you to stop. A special MedGuide will be given to you by the pharmacist with each prescription and refill. Be sure to read this information carefully each time. Talk to your care team about the use of this medication in children. While it may be prescribed for children as young as 10 years for selected conditions, precautions do apply. Overdosage: If you think you have taken too much of this medicine contact a poison control center or emergency room at once. NOTE: This medicine is only for you. Do not share this medicine with others. What if I miss a dose? If you miss a dose, take it as soon as you can. If it is almost time for your next dose, take only that dose. Do not take double or extra doses. What may interact with this medication? Alcohol Diuretics Insulin Lithium This list may not describe all possible interactions. Give your health care provider a list of all the medicines, herbs, non-prescription drugs, or dietary supplements you use. Also tell them if you smoke, drink alcohol, or use illegal drugs. Some items may interact with your medicine. What should I watch for while using this medication? Visit your care team for regular checks on your progress. Tell your care team if your symptoms do not start to get better or if they get worse. You may need blood work done while you are taking this medication. If you have diabetes, your care team will monitor your HbA1C (A1C). This test shows what your average blood sugar (glucose) level was over the past 2 to 3 months. This medication  may increase the risk of diabetic ketoacidosis (DKA), a serious condition in which high ketone levels make your blood too acidic. Your care team may ask you to check for ketones in your urine or blood while you are taking this medication. If you develop nausea, vomiting, stomach pain, unusual weakness or fatigue, or trouble breathing, stop taking this  medication and call your care team right away. Check with your care team if you have severe diarrhea, nausea, and vomiting, or if you sweat a lot. The loss of too much body fluid may make it dangerous for you to take this medication. Know the symptoms of low blood sugar and know how to treat it. Always carry a source of quick sugar with you. Examples include hard sugar candy or glucose tablets. Make sure others know that you can choke if you eat or drink if your blood sugar is too low and you are unable to care for yourself. Get medical help at once. Tell your care team if you have high blood sugar. Your medication dose may change if your body is under stress. Some types of stress that may affect your blood sugar include fever, infection, and surgery. If you have diabetes, you may get a false-positive result for sugar in your urine while you are taking this medication. Check with your care team. What side effects may I notice from receiving this medication? Side effects that you should report to your care team as soon as possible: Allergic reactions--skin rash, itching, hives, swelling of the face, lips, tongue, or throat Dehydration--increased thirst, dry mouth, feeling faint or lightheaded, headache, dark yellow or brown urine Diabetic ketoacidosis (DKA)--increased thirst or amount of urine, dry mouth, fatigue, fruity odor to breath, trouble breathing, stomach pain, nausea, vomiting Genital yeast infection--redness, swelling, pain, or itchiness, odor, thick or lumpy discharge New pain or tenderness, change in skin color, sores or ulcers, infection of the leg or foot Infection or redness, swelling, tenderness, or pain in the genitals, or area from the genitals to the back of the rectum Urinary tract infection (UTI)--burning when passing urine, passing frequent small amounts of urine, bloody or cloudy urine, pain in the lower back or sides This list may not describe all possible side effects. Call your  doctor for medical advice about side effects. You may report side effects to FDA at 1-800-FDA-1088. Where should I keep my medication? Keep out of the reach of children and pets. Store at room temperature between 20 and 25 degrees C (68 and 77 degrees F). Get rid of any unused medication after the expiration date. To get rid of medications that are no longer needed or have expired: Take the medication to a medication take-back program. Check with your pharmacy or law enforcement to find a location. If you cannot return the medication, check the label or package insert to see if the medication should be thrown out in the garbage or flushed down the toilet. If you are not sure, ask your care team. If it is safe to put it in the trash, take the medication out of the container. Mix the medication with cat litter, dirt, coffee grounds, or other unwanted substance. Seal the mixture in a bag or container. Put it in the trash. NOTE: This sheet is a summary. It may not cover all possible information. If you have questions about this medicine, talk to your doctor, pharmacist, or health care provider.  2024 [employer]vier/Gold Standard (2022-04-22 00:00:00)

## 2022-10-01 ENCOUNTER — Ambulatory Visit: Payer: Medicare Other | Attending: Cardiovascular Disease | Admitting: Cardiovascular Disease

## 2022-10-01 ENCOUNTER — Encounter: Payer: Self-pay | Admitting: Cardiovascular Disease

## 2022-10-01 VITALS — BP 138/88 | HR 85 | Ht 71.0 in | Wt 330.0 lb

## 2022-10-01 DIAGNOSIS — I4819 Other persistent atrial fibrillation: Secondary | ICD-10-CM | POA: Diagnosis not present

## 2022-10-01 LAB — CUP PACEART INCLINIC DEVICE CHECK
Battery Remaining Longevity: 98 mo
Battery Voltage: 3.01 V
Brady Statistic RA Percent Paced: 88 %
Brady Statistic RV Percent Paced: 1.5 %
Date Time Interrogation Session: 20240906144725
Implantable Lead Connection Status: 753985
Implantable Lead Connection Status: 753985
Implantable Lead Implant Date: 20221108
Implantable Lead Implant Date: 20221108
Implantable Lead Location: 753859
Implantable Lead Location: 753860
Implantable Pulse Generator Implant Date: 20221108
Lead Channel Impedance Value: 475 Ohm
Lead Channel Impedance Value: 487.5 Ohm
Lead Channel Pacing Threshold Amplitude: 0.75 V
Lead Channel Pacing Threshold Amplitude: 0.75 V
Lead Channel Pacing Threshold Amplitude: 0.75 V
Lead Channel Pacing Threshold Amplitude: 0.75 V
Lead Channel Pacing Threshold Pulse Width: 0.5 ms
Lead Channel Pacing Threshold Pulse Width: 0.5 ms
Lead Channel Pacing Threshold Pulse Width: 0.5 ms
Lead Channel Pacing Threshold Pulse Width: 0.5 ms
Lead Channel Sensing Intrinsic Amplitude: 12 mV
Lead Channel Sensing Intrinsic Amplitude: 5 mV
Lead Channel Setting Pacing Amplitude: 2 V
Lead Channel Setting Pacing Amplitude: 2.5 V
Lead Channel Setting Pacing Pulse Width: 0.5 ms
Lead Channel Setting Sensing Sensitivity: 2 mV
Pulse Gen Model: 2272
Pulse Gen Serial Number: 3968031

## 2022-10-01 NOTE — Progress Notes (Signed)
    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  Since last being seen in our clinic, the patient reports doing reasonably well.    He was very fatigued due to recurrent anemia due to GI losses. This appears to be under control now. I had referred him for a Watchman, but the procedure was deferred since his GI bleed appeared resolved.  He presents today as an urgent visit.  He noted to an atypical atrial flutter with 2-1 ventricular conduction about a week ago.  He has been short of breath, fatigued.  Recently he has noticed some slight ankle edema.  He notes that he had a similar experience years ago that led to decompensated heart failure and a prolonged admission.  He has had 2 ablations for atrial fibrillation by [name].  The most recent was in October 2021.  Pulmonary vein isolation was reinforced and a posterior wall ablation performed.  [name] noted that the patient had a complex atypical flutter with variable cycle length that was not mappable.  DC cardioversion was scheduled for Tuesday, May 7.  The delay was due to the fact that the patient had taken [facility], increasing the risk of aspiration.  He underwent Tikosyn load in May 2024.  Despite the Tikosyn, he has had a few breakthrough episodes of atrial flutter, sometimes with rapid rates.  He had discontinue metoprolol, so this was resumed. He reports that he is doing well.       Physical Exam: Vitals:   10/01/22 1137  BP: 138/88  Pulse: 85  SpO2: 95%  Weight: (!) 330 lb (149.7 kg)  Height: 5\' 11"  (1.803 m)     Gen: Appears comfortable, well-nourished CV: tachy, regular, + dependent edema. No murmurs, rubs. Basilar rales clear with deep breaths. No JVD sitting upright Pulm: breathing easily   Pacemaker interrogation- reviewed in detail today,  See PACEART report    Assessment and Plan:  Persistent afib  and atrial flutter Now with atypical flutter with RVR He has had acute CHF in the past due to AF/flutter, Now on dofetilide, tolerating it well I reviewed labs from June.  EKG today shows appropriate QTc Has not tolerated metoprolol in the past due to fatigue; tolerating 25 mg daily now Chads2vasc score is 6.     Symptomatic sinus bradycardia  Normal pacemaker function See [facility] report he is not device dependant today  HTN Stable No change required today  Anemia Secondary to GI bleeding/ iron deficiency anemia Follows closely with GI (Dr Marletta Lor)  Obesity Body mass index is 46.03 kg/m. Lifestyle modification advised  OSA Compliance with CPAP advised  Chronic diastolic dysfunction Stable No change required today   [name], MD  10/01/2022 11:57 AM

## 2022-10-01 NOTE — Patient Instructions (Signed)
Medication Instructions:  Continue all current medications.   Labwork: none  Testing/Procedures: none  Follow-Up: 6 months   Any Other Special Instructions Will Be Listed Below (If Applicable).   If you need a refill on your cardiac medications before your next appointment, please call your pharmacy.  

## 2022-10-03 ENCOUNTER — Other Ambulatory Visit (HOSPITAL_COMMUNITY): Payer: Self-pay | Admitting: Physician Assistant

## 2022-10-07 ENCOUNTER — Ambulatory Visit (HOSPITAL_COMMUNITY)
Admission: RE | Admit: 2022-10-07 | Discharge: 2022-10-07 | Disposition: A | Discharge status: Home / Self Care | Payer: Medicare Other | Source: Ambulatory Visit | Attending: Internal Medicine | Admitting: Internal Medicine

## 2022-10-07 VITALS — BP 160/84 | HR 68 | Ht 71.0 in | Wt 331.6 lb

## 2022-10-07 DIAGNOSIS — I4891 Unspecified atrial fibrillation: Secondary | ICD-10-CM

## 2022-10-07 DIAGNOSIS — E669 Obesity, unspecified: Secondary | ICD-10-CM | POA: Insufficient documentation

## 2022-10-07 DIAGNOSIS — I4892 Unspecified atrial flutter: Secondary | ICD-10-CM | POA: Insufficient documentation

## 2022-10-07 DIAGNOSIS — Z6841 Body Mass Index (BMI) 40.0 and over, adult: Secondary | ICD-10-CM | POA: Diagnosis not present

## 2022-10-07 DIAGNOSIS — Z823 Family history of stroke: Secondary | ICD-10-CM | POA: Diagnosis not present

## 2022-10-07 DIAGNOSIS — I4819 Other persistent atrial fibrillation: Secondary | ICD-10-CM

## 2022-10-07 DIAGNOSIS — Z79899 Other long term (current) drug therapy: Secondary | ICD-10-CM

## 2022-10-07 DIAGNOSIS — E785 Hyperlipidemia, unspecified: Secondary | ICD-10-CM | POA: Diagnosis not present

## 2022-10-07 DIAGNOSIS — I509 Heart failure, unspecified: Secondary | ICD-10-CM | POA: Diagnosis not present

## 2022-10-07 DIAGNOSIS — Z5181 Encounter for therapeutic drug level monitoring: Secondary | ICD-10-CM | POA: Diagnosis not present

## 2022-10-07 DIAGNOSIS — Z7182 Exercise counseling: Secondary | ICD-10-CM | POA: Diagnosis not present

## 2022-10-07 DIAGNOSIS — I110 Hypertensive heart disease with heart failure: Secondary | ICD-10-CM | POA: Insufficient documentation

## 2022-10-07 DIAGNOSIS — Z7901 Long term (current) use of anticoagulants: Secondary | ICD-10-CM | POA: Insufficient documentation

## 2022-10-07 DIAGNOSIS — D6869 Other thrombophilia: Secondary | ICD-10-CM | POA: Insufficient documentation

## 2022-10-07 LAB — BASIC METABOLIC PANEL
Anion gap: 11 (ref 5–15)
BUN: 10 mg/dL (ref 8–23)
CO2: 23 mmol/L (ref 22–32)
Calcium: 9.9 mg/dL (ref 8.9–10.3)
Chloride: 102 mmol/L (ref 98–111)
Creatinine, Ser: 0.65 mg/dL (ref 0.61–1.24)
GFR, Estimated: >60 mL/min (ref >60)
Glucose, Bld: 164 mg/dL — ABNORMAL HIGH (ref 70–99)
Potassium: 3.9 mmol/L (ref 3.5–5.1)
Sodium: 136 mmol/L (ref 135–145)

## 2022-10-07 LAB — MAGNESIUM: Magnesium: 1.7 mg/dL (ref 1.7–2.4)

## 2022-10-07 MED ORDER — POTASSIUM CHLORIDE CRYS ER 20 MEQ PO TBCR: 60.0000 meq | EXTENDED_RELEASE_TABLET | Freq: Every day | ORAL | 1 refills | Status: DC
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
## 2022-10-08 ENCOUNTER — Other Ambulatory Visit (HOSPITAL_COMMUNITY): Payer: Self-pay | Admitting: *Deleted

## 2022-10-08 MED ORDER — MAGNESIUM OXIDE 400 MG PO TABS: 1.0000 | ORAL_TABLET | Freq: Two times a day (BID) | ORAL | 9 refills | Status: DC

## 2022-10-25 DIAGNOSIS — E1169 Type 2 diabetes mellitus with other specified complication: Secondary | ICD-10-CM | POA: Diagnosis not present

## 2022-10-25 DIAGNOSIS — G8929 Other chronic pain: Secondary | ICD-10-CM | POA: Diagnosis not present

## 2022-10-25 DIAGNOSIS — E7801 Familial hypercholesterolemia: Secondary | ICD-10-CM | POA: Diagnosis not present

## 2022-10-26 DIAGNOSIS — H401222 Low-tension glaucoma, left eye, moderate stage: Secondary | ICD-10-CM | POA: Diagnosis not present

## 2022-10-26 DIAGNOSIS — H25813 Combined forms of age-related cataract, bilateral: Secondary | ICD-10-CM | POA: Diagnosis not present

## 2022-10-26 DIAGNOSIS — H04123 Dry eye syndrome of bilateral lacrimal glands: Secondary | ICD-10-CM | POA: Diagnosis not present

## 2022-10-26 DIAGNOSIS — H02125 Mechanical ectropion of left lower eyelid: Secondary | ICD-10-CM | POA: Diagnosis not present

## 2022-10-26 DIAGNOSIS — H401213 Low-tension glaucoma, right eye, severe stage: Secondary | ICD-10-CM | POA: Diagnosis not present

## 2022-11-02 ENCOUNTER — Other Ambulatory Visit (HOSPITAL_COMMUNITY): Payer: Self-pay

## 2022-11-02 ENCOUNTER — Telehealth: Payer: Self-pay | Admitting: Pharmacy Technician

## 2022-11-02 NOTE — Telephone Encounter (Signed)
Pharmacy Patient Advocate Encounter   Received notification from CoverMyMeds that prior authorization for [facility] is required/requested.   Insurance verification completed.   The patient is insured through  [employer]  .   Per test claim: The current 28 day co-pay is, $0.  No PA needed at this time. This test claim was processed through [facility]- copay amounts may vary at other pharmacies due to pharmacy/plan contracts, or as the patient moves through the different stages of their insurance plan.

## 2022-11-17 ENCOUNTER — Ambulatory Visit (INDEPENDENT_AMBULATORY_CARE_PROVIDER_SITE_OTHER): Payer: Medicare Other | Admitting: Podiatry

## 2022-11-17 ENCOUNTER — Encounter: Payer: Self-pay | Admitting: Podiatry

## 2022-11-17 VITALS — Ht 71.0 in | Wt 331.6 lb

## 2022-11-17 DIAGNOSIS — Z794 Long term (current) use of insulin: Secondary | ICD-10-CM

## 2022-11-17 DIAGNOSIS — B351 Tinea unguium: Secondary | ICD-10-CM | POA: Diagnosis not present

## 2022-11-17 DIAGNOSIS — E1165 Type 2 diabetes mellitus with hyperglycemia: Secondary | ICD-10-CM | POA: Diagnosis not present

## 2022-11-17 DIAGNOSIS — M79674 Pain in right toe(s): Secondary | ICD-10-CM

## 2022-11-17 NOTE — Progress Notes (Signed)
This patient returns to my office for at risk foot care.  This patient requires this care by a professional since this patient will be at risk due to having diabetes.  This patient is unable to cut nails himself since the patient cannot reach his nails.These nails are painful walking and wearing shoes.  This patient presents for at risk foot care today.  General Appearance  Alert, conversant and in no acute stress.  Vascular  Dorsalis pedis and posterior tibial  pulses are palpable  bilaterally.  Capillary return is within normal limits  bilaterally. Temperature is within normal limits  bilaterally.  Neurologic  Senn-Weinstein monofilament wire test within normal limits  bilaterally. Muscle power within normal limits bilaterally.  Nails Thick disfigured discolored nails with subungual debris  from hallux to fifth toes right foot. No evidence of bacterial infection or drainage bilaterally. Absent 1,2 left toenails.  Orthopedic  No limitations of motion  feet .  No crepitus or effusions noted.  No bony pathology or digital deformities noted.  Skin  normotropic skin with no porokeratosis noted bilaterally.  No signs of infections or ulcers noted.     Onychomycosis  Pain in right toes    Consent was obtained for treatment procedures.   Mechanical debridement of nails 1-5  right and 3-5 left foot. performed with a nail nipper.  Filed with dremel without incident.    Return office visit    3 months                  Told patient to return for periodic foot care and evaluation due to potential at risk complications.   Helane Gunther DPM

## 2022-11-29 ENCOUNTER — Encounter: Payer: Self-pay | Admitting: Endocrinology

## 2022-11-29 ENCOUNTER — Other Ambulatory Visit: Payer: Self-pay

## 2022-11-29 DIAGNOSIS — E1165 Type 2 diabetes mellitus with hyperglycemia: Secondary | ICD-10-CM

## 2022-11-29 MED ORDER — OZEMPIC (2 MG/DOSE) 8 MG/3ML ~~LOC~~ SOPN: PEN_INJECTOR | SUBCUTANEOUS | 3 refills | Status: DC

## 2022-11-29 NOTE — Telephone Encounter (Signed)
Ozempic refill request complete, patient made aware via phone call

## 2022-11-30 ENCOUNTER — Ambulatory Visit (INDEPENDENT_AMBULATORY_CARE_PROVIDER_SITE_OTHER): Payer: Medicare Other

## 2022-11-30 DIAGNOSIS — I495 Sick sinus syndrome: Secondary | ICD-10-CM | POA: Diagnosis not present

## 2022-11-30 LAB — CUP PACEART REMOTE DEVICE CHECK
Battery Remaining Longevity: 89 mo
Battery Remaining Percentage: 83 %
Battery Voltage: 3.01 V
Brady Statistic AP VP Percent: 1.4 %
Brady Statistic AP VS Percent: 89 %
Brady Statistic AS VP Percent: 1 %
Brady Statistic AS VS Percent: 7.5 %
Brady Statistic RA Percent Paced: 89 %
Brady Statistic RV Percent Paced: 2.4 %
Date Time Interrogation Session: 20241105022011
Implantable Lead Connection Status: 753985
Implantable Lead Connection Status: 753985
Implantable Lead Implant Date: 20221108
Implantable Lead Implant Date: 20221108
Implantable Lead Location: 753859
Implantable Lead Location: 753860
Implantable Pulse Generator Implant Date: 20221108
Lead Channel Impedance Value: 450 Ohm
Lead Channel Impedance Value: 530 Ohm
Lead Channel Pacing Threshold Amplitude: 0.75 V
Lead Channel Pacing Threshold Amplitude: 0.75 V
Lead Channel Pacing Threshold Pulse Width: 0.5 ms
Lead Channel Pacing Threshold Pulse Width: 0.5 ms
Lead Channel Sensing Intrinsic Amplitude: 12 mV
Lead Channel Sensing Intrinsic Amplitude: 5 mV
Lead Channel Setting Pacing Amplitude: 2 V
Lead Channel Setting Pacing Amplitude: 2.5 V
Lead Channel Setting Pacing Pulse Width: 0.5 ms
Lead Channel Setting Sensing Sensitivity: 2 mV
Pulse Gen Model: 2272
Pulse Gen Serial Number: 3968031

## 2022-12-02 ENCOUNTER — Ambulatory Visit: Payer: Medicare Other | Admitting: Endocrinology

## 2022-12-02 ENCOUNTER — Encounter: Payer: Self-pay | Admitting: Endocrinology

## 2022-12-02 VITALS — BP 150/86 | HR 66 | Resp 20 | Ht 71.0 in | Wt 334.8 lb

## 2022-12-02 DIAGNOSIS — Z794 Long term (current) use of insulin: Secondary | ICD-10-CM | POA: Diagnosis not present

## 2022-12-02 DIAGNOSIS — E1165 Type 2 diabetes mellitus with hyperglycemia: Secondary | ICD-10-CM

## 2022-12-02 LAB — POCT GLYCOSYLATED HEMOGLOBIN (HGB A1C): Hemoglobin A1C: 7.7 % — AB (ref 4.0–5.6)

## 2022-12-02 MED ORDER — TRESIBA FLEXTOUCH 200 UNIT/ML ~~LOC~~ SOPN: 66.0000 [IU] | PEN_INJECTOR | Freq: Every day | SUBCUTANEOUS | 4 refills | Status: DC
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
## 2022-12-02 NOTE — Patient Instructions (Signed)
  Continue Ozempic 2 mg weekly. Continue metformin 1000 mg two times a day.  Increase Tresiba 66 units daily.

## 2022-12-10 DIAGNOSIS — E1169 Type 2 diabetes mellitus with other specified complication: Secondary | ICD-10-CM | POA: Diagnosis not present

## 2022-12-10 DIAGNOSIS — E7801 Familial hypercholesterolemia: Secondary | ICD-10-CM | POA: Diagnosis not present

## 2022-12-10 DIAGNOSIS — I10 Essential (primary) hypertension: Secondary | ICD-10-CM | POA: Diagnosis not present

## 2022-12-17 DIAGNOSIS — K922 Gastrointestinal hemorrhage, unspecified: Secondary | ICD-10-CM | POA: Diagnosis not present

## 2022-12-17 DIAGNOSIS — M549 Dorsalgia, unspecified: Secondary | ICD-10-CM | POA: Diagnosis not present

## 2022-12-17 DIAGNOSIS — Z6841 Body Mass Index (BMI) 40.0 and over, adult: Secondary | ICD-10-CM | POA: Diagnosis not present

## 2022-12-17 DIAGNOSIS — E1169 Type 2 diabetes mellitus with other specified complication: Secondary | ICD-10-CM | POA: Diagnosis not present

## 2022-12-17 DIAGNOSIS — R030 Elevated blood-pressure reading, without diagnosis of hypertension: Secondary | ICD-10-CM | POA: Diagnosis not present

## 2022-12-17 DIAGNOSIS — Z1329 Encounter for screening for other suspected endocrine disorder: Secondary | ICD-10-CM | POA: Diagnosis not present

## 2022-12-17 DIAGNOSIS — I4891 Unspecified atrial fibrillation: Secondary | ICD-10-CM | POA: Diagnosis not present

## 2022-12-17 DIAGNOSIS — R0602 Shortness of breath: Secondary | ICD-10-CM | POA: Diagnosis not present

## 2022-12-17 DIAGNOSIS — R531 Weakness: Secondary | ICD-10-CM | POA: Diagnosis not present

## 2022-12-17 DIAGNOSIS — M25569 Pain in unspecified knee: Secondary | ICD-10-CM | POA: Diagnosis not present

## 2022-12-17 DIAGNOSIS — E7800 Pure hypercholesterolemia, unspecified: Secondary | ICD-10-CM | POA: Diagnosis not present

## 2022-12-22 DIAGNOSIS — R0602 Shortness of breath: Secondary | ICD-10-CM | POA: Diagnosis not present

## 2022-12-23 ENCOUNTER — Other Ambulatory Visit (HOSPITAL_COMMUNITY): Payer: Self-pay | Admitting: Physician Assistant

## 2022-12-26 ENCOUNTER — Observation Stay (HOSPITAL_COMMUNITY)
Admission: EM | Admit: 2022-12-26 | Discharge: 2022-12-27 | Disposition: A | Discharge status: Home / Self Care | Payer: Medicare Other | Attending: Student | Admitting: Student

## 2022-12-26 ENCOUNTER — Emergency Department (HOSPITAL_COMMUNITY): Payer: Medicare Other

## 2022-12-26 ENCOUNTER — Other Ambulatory Visit: Payer: Self-pay

## 2022-12-26 DIAGNOSIS — H409 Unspecified glaucoma: Secondary | ICD-10-CM | POA: Insufficient documentation

## 2022-12-26 DIAGNOSIS — Z85828 Personal history of other malignant neoplasm of skin: Secondary | ICD-10-CM | POA: Diagnosis not present

## 2022-12-26 DIAGNOSIS — R079 Chest pain, unspecified: Secondary | ICD-10-CM

## 2022-12-26 DIAGNOSIS — I2510 Atherosclerotic heart disease of native coronary artery without angina pectoris: Secondary | ICD-10-CM | POA: Diagnosis present

## 2022-12-26 DIAGNOSIS — I471 Supraventricular tachycardia, unspecified: Secondary | ICD-10-CM | POA: Diagnosis not present

## 2022-12-26 DIAGNOSIS — I21A1 Myocardial infarction type 2: Secondary | ICD-10-CM | POA: Insufficient documentation

## 2022-12-26 DIAGNOSIS — R002 Palpitations: Secondary | ICD-10-CM | POA: Insufficient documentation

## 2022-12-26 DIAGNOSIS — G8929 Other chronic pain: Secondary | ICD-10-CM | POA: Diagnosis not present

## 2022-12-26 DIAGNOSIS — I10 Essential (primary) hypertension: Secondary | ICD-10-CM | POA: Diagnosis not present

## 2022-12-26 DIAGNOSIS — Z6841 Body Mass Index (BMI) 40.0 and over, adult: Secondary | ICD-10-CM | POA: Insufficient documentation

## 2022-12-26 DIAGNOSIS — Z7985 Long-term (current) use of injectable non-insulin antidiabetic drugs: Secondary | ICD-10-CM | POA: Insufficient documentation

## 2022-12-26 DIAGNOSIS — G629 Polyneuropathy, unspecified: Secondary | ICD-10-CM | POA: Diagnosis not present

## 2022-12-26 DIAGNOSIS — Z96643 Presence of artificial hip joint, bilateral: Secondary | ICD-10-CM | POA: Diagnosis not present

## 2022-12-26 DIAGNOSIS — G4733 Obstructive sleep apnea (adult) (pediatric): Secondary | ICD-10-CM | POA: Diagnosis not present

## 2022-12-26 DIAGNOSIS — Z7984 Long term (current) use of oral hypoglycemic drugs: Secondary | ICD-10-CM | POA: Insufficient documentation

## 2022-12-26 DIAGNOSIS — Z794 Long term (current) use of insulin: Secondary | ICD-10-CM | POA: Diagnosis not present

## 2022-12-26 DIAGNOSIS — R000 Tachycardia, unspecified: Secondary | ICD-10-CM | POA: Diagnosis present

## 2022-12-26 DIAGNOSIS — R7989 Other specified abnormal findings of blood chemistry: Secondary | ICD-10-CM | POA: Diagnosis not present

## 2022-12-26 DIAGNOSIS — Z7901 Long term (current) use of anticoagulants: Secondary | ICD-10-CM | POA: Diagnosis not present

## 2022-12-26 DIAGNOSIS — Z79899 Other long term (current) drug therapy: Secondary | ICD-10-CM | POA: Insufficient documentation

## 2022-12-26 DIAGNOSIS — K219 Gastro-esophageal reflux disease without esophagitis: Secondary | ICD-10-CM | POA: Diagnosis not present

## 2022-12-26 DIAGNOSIS — I4891 Unspecified atrial fibrillation: Secondary | ICD-10-CM | POA: Diagnosis not present

## 2022-12-26 DIAGNOSIS — R0789 Other chest pain: Secondary | ICD-10-CM | POA: Insufficient documentation

## 2022-12-26 DIAGNOSIS — E1142 Type 2 diabetes mellitus with diabetic polyneuropathy: Secondary | ICD-10-CM | POA: Insufficient documentation

## 2022-12-26 DIAGNOSIS — E1165 Type 2 diabetes mellitus with hyperglycemia: Secondary | ICD-10-CM | POA: Diagnosis not present

## 2022-12-26 DIAGNOSIS — M549 Dorsalgia, unspecified: Secondary | ICD-10-CM | POA: Insufficient documentation

## 2022-12-26 DIAGNOSIS — E782 Mixed hyperlipidemia: Secondary | ICD-10-CM | POA: Diagnosis not present

## 2022-12-26 DIAGNOSIS — Z950 Presence of cardiac pacemaker: Secondary | ICD-10-CM | POA: Insufficient documentation

## 2022-12-26 DIAGNOSIS — I4819 Other persistent atrial fibrillation: Secondary | ICD-10-CM | POA: Diagnosis not present

## 2022-12-26 DIAGNOSIS — R0602 Shortness of breath: Secondary | ICD-10-CM

## 2022-12-26 DIAGNOSIS — E66813 Obesity, class 3: Secondary | ICD-10-CM | POA: Diagnosis present

## 2022-12-26 DIAGNOSIS — R0989 Other specified symptoms and signs involving the circulatory and respiratory systems: Secondary | ICD-10-CM | POA: Diagnosis not present

## 2022-12-26 LAB — TSH: TSH: 2.437 u[IU]/mL (ref 0.350–4.500)

## 2022-12-26 LAB — GLUCOSE, CAPILLARY: Glucose-Capillary: 242 mg/dL — ABNORMAL HIGH (ref 70–99)

## 2022-12-26 LAB — MAGNESIUM: Magnesium: 1.5 mg/dL — ABNORMAL LOW (ref 1.7–2.4)

## 2022-12-26 LAB — TROPONIN I (HIGH SENSITIVITY)
Troponin I (High Sensitivity): 30 ng/L — ABNORMAL HIGH (ref <18)
Troponin I (High Sensitivity): 50 ng/L — ABNORMAL HIGH (ref <18)
Troponin I (High Sensitivity): 83 ng/L — ABNORMAL HIGH (ref <18)
Troponin I (High Sensitivity): 75 ng/L — ABNORMAL HIGH (ref <18)

## 2022-12-26 LAB — CBC
HCT: 46.6 % (ref 39.0–52.0)
Hemoglobin: 16 g/dL (ref 13.0–17.0)
MCH: 32.7 pg (ref 26.0–34.0)
MCHC: 34.3 g/dL (ref 30.0–36.0)
MCV: 95.1 fL (ref 80.0–100.0)
Platelets: 183 10*3/uL (ref 150–400)
RBC: 4.9 MIL/uL (ref 4.22–5.81)
RDW: 12.8 % (ref 11.5–15.5)
WBC: 9.4 10*3/uL (ref 4.0–10.5)
nRBC: 0 % (ref 0.0–0.2)

## 2022-12-26 LAB — BASIC METABOLIC PANEL
Anion gap: 15 (ref 5–15)
BUN: 13 mg/dL (ref 8–23)
CO2: 20 mmol/L — ABNORMAL LOW (ref 22–32)
Calcium: 9.8 mg/dL (ref 8.9–10.3)
Chloride: 101 mmol/L (ref 98–111)
Creatinine, Ser: 0.73 mg/dL (ref 0.61–1.24)
GFR, Estimated: >60 mL/min (ref >60)
Glucose, Bld: 302 mg/dL — ABNORMAL HIGH (ref 70–99)
Potassium: 4.3 mmol/L (ref 3.5–5.1)
Sodium: 136 mmol/L (ref 135–145)

## 2022-12-26 LAB — BRAIN NATRIURETIC PEPTIDE: B Natriuretic Peptide: 70 pg/mL (ref 0.0–100.0)

## 2022-12-26 MED ORDER — ADENOSINE 6 MG/2ML IV SOLN: 12.0000 mg | Freq: Once | INTRAVENOUS | Status: DC

## 2022-12-26 MED ORDER — APIXABAN 5 MG PO TABS
5.0000 mg | ORAL_TABLET | Freq: Two times a day (BID) | ORAL | Status: DC
  Administered 2022-12-26: 5 mg via ORAL
  Filled 2022-12-26 (×2): qty 1

## 2022-12-26 MED ORDER — ONDANSETRON HCL 4 MG PO TABS: 4.0000 mg | ORAL_TABLET | Freq: Four times a day (QID) | ORAL | Status: DC | PRN

## 2022-12-26 MED ORDER — ADENOSINE 6 MG/2ML IV SOLN
INTRAVENOUS | Status: AC
  Filled 2022-12-26: qty 4

## 2022-12-26 MED ORDER — PANTOPRAZOLE SODIUM 40 MG PO TBEC
40.0000 mg | DELAYED_RELEASE_TABLET | Freq: Every day | ORAL | Status: DC
  Filled 2022-12-26: qty 1

## 2022-12-26 MED ORDER — LATANOPROST 0.005 % OP SOLN
1.0000 [drp] | Freq: Every day | OPHTHALMIC | Status: DC
  Administered 2022-12-26: 1 [drp] via OPHTHALMIC
  Filled 2022-12-26: qty 2.5

## 2022-12-26 MED ORDER — BACLOFEN 10 MG PO TABS
10.0000 mg | ORAL_TABLET | Freq: Every day | ORAL | Status: DC
  Administered 2022-12-26: 10 mg via ORAL
  Filled 2022-12-26: qty 1

## 2022-12-26 MED ORDER — ADENOSINE 6 MG/2ML IV SOLN
INTRAVENOUS | Status: AC
  Filled 2022-12-26: qty 2

## 2022-12-26 MED ORDER — ACETAMINOPHEN 325 MG PO TABS
650.0000 mg | ORAL_TABLET | Freq: Four times a day (QID) | ORAL | Status: DC | PRN
  Administered 2022-12-26: 650 mg via ORAL

## 2022-12-26 MED ORDER — SIMVASTATIN 20 MG PO TABS
40.0000 mg | ORAL_TABLET | Freq: Every day | ORAL | Status: DC
  Filled 2022-12-26: qty 2

## 2022-12-26 MED ORDER — INSULIN ASPART 100 UNIT/ML IJ SOLN
0.0000 [IU] | Freq: Every day | INTRAMUSCULAR | Status: DC
  Administered 2022-12-27: 2 [IU] via SUBCUTANEOUS

## 2022-12-26 MED ORDER — ADENOSINE 6 MG/2ML IV SOLN
6.0000 mg | Freq: Once | INTRAVENOUS | Status: AC
  Administered 2022-12-26: 6 mg via INTRAVENOUS

## 2022-12-26 MED ORDER — INSULIN GLARGINE-YFGN 100 UNIT/ML ~~LOC~~ SOLN
10.0000 [IU] | Freq: Every day | SUBCUTANEOUS | Status: DC
  Administered 2022-12-27: 10 [IU] via SUBCUTANEOUS
  Filled 2022-12-26 (×2): qty 0.1

## 2022-12-26 MED ORDER — MAGNESIUM SULFATE 2 GM/50ML IV SOLN
2.0000 g | Freq: Once | INTRAVENOUS | Status: AC
Start: 2022-12-26 — End: 2022-12-26
  Administered 2022-12-26: 2 g via INTRAVENOUS
  Filled 2022-12-26: qty 50

## 2022-12-26 MED ORDER — DOFETILIDE 125 MCG PO CAPS
250.0000 ug | ORAL_CAPSULE | Freq: Two times a day (BID) | ORAL | Status: DC
  Administered 2022-12-27: 250 ug via ORAL
  Filled 2022-12-26 (×6): qty 1

## 2022-12-26 MED ORDER — MAGNESIUM OXIDE -MG SUPPLEMENT 400 (240 MG) MG PO TABS
400.0000 mg | ORAL_TABLET | Freq: Two times a day (BID) | ORAL | Status: DC
  Administered 2022-12-26: 400 mg via ORAL
  Filled 2022-12-26 (×2): qty 1

## 2022-12-26 MED ORDER — INSULIN ASPART 100 UNIT/ML IJ SOLN
0.0000 [IU] | Freq: Three times a day (TID) | INTRAMUSCULAR | Status: DC
Start: 2022-12-27 — End: 2022-12-27
  Administered 2022-12-27: 2 [IU] via SUBCUTANEOUS

## 2022-12-26 MED ORDER — ETOMIDATE 2 MG/ML IV SOLN
INTRAVENOUS | Status: AC
  Filled 2022-12-26: qty 10

## 2022-12-26 MED ORDER — LOSARTAN POTASSIUM 50 MG PO TABS
50.0000 mg | ORAL_TABLET | Freq: Every day | ORAL | Status: DC
  Filled 2022-12-26: qty 1

## 2022-12-26 MED ORDER — DOCUSATE SODIUM 100 MG PO CAPS
100.0000 mg | ORAL_CAPSULE | Freq: Every day | ORAL | Status: DC
  Filled 2022-12-26: qty 1

## 2022-12-26 MED ORDER — ONDANSETRON HCL 4 MG/2ML IJ SOLN: 4.0000 mg | Freq: Four times a day (QID) | INTRAMUSCULAR | Status: DC | PRN

## 2022-12-26 MED ORDER — METOPROLOL SUCCINATE ER 25 MG PO TB24
25.0000 mg | ORAL_TABLET | Freq: Every day | ORAL | Status: DC
  Filled 2022-12-26: qty 1

## 2022-12-26 MED ORDER — ACETAMINOPHEN 650 MG RE SUPP: 650.0000 mg | Freq: Four times a day (QID) | RECTAL | Status: DC | PRN

## 2022-12-26 MED ORDER — GABAPENTIN 100 MG PO CAPS
200.0000 mg | ORAL_CAPSULE | Freq: Every day | ORAL | Status: DC
  Administered 2022-12-26: 200 mg via ORAL
  Filled 2022-12-26: qty 2

## 2022-12-26 MED ORDER — ETOMIDATE 2 MG/ML IV SOLN
10.0000 mg | Freq: Once | INTRAVENOUS | Status: AC
  Administered 2022-12-26: 10 mg via INTRAVENOUS

## 2022-12-26 NOTE — ED Notes (Signed)
Pt requested to sit in a chair. Recliner brought into the room for pt. Pt is still hooked up to the monitor.
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
## 2022-12-26 NOTE — ED Provider Notes (Signed)
  Physical Exam  BP 134/83   Pulse 62   Temp 98.3 F (36.8 C) (Oral)   Resp 15   Ht 5\' 11"  (1.803 m)   Wt (!) 149.7 kg   SpO2 96%   BMI 46.03 kg/m   Physical Exam Vitals and nursing note reviewed.  HENT:     Head: Normocephalic and atraumatic.  Eyes:     Pupils: Pupils are equal, round, and reactive to light.  Cardiovascular:     Rate and Rhythm: Normal rate and regular rhythm.  Pulmonary:     Effort: Pulmonary effort is normal.     Breath sounds: Normal breath sounds.  Abdominal:     Palpations: Abdomen is soft.     Tenderness: There is no abdominal tenderness.  Skin:    General: Skin is warm and dry.  Neurological:     Mental Status: He is alert.  Psychiatric:        Mood and Affect: Mood normal.     Procedures  Procedures  ED Course / MDM   Clinical Course as of 12/26/22 2034  Wynelle Link Dec 26, 2022  1425 Full code verified with the patient and his wife [RP]  1533 Signed out to Dr Elayne Snare.  [RP]  1629 Delta troponin elevated from initial value up to 50 from initial of 30 following cardioversion.  Patient has remained hemodynamically stable without current chest pain.  Will obtain third troponin and reach out to cardiology [MP]  1932 Discussed case with cardiology on-call who agrees with plan for discharge with close outpatient cardiology follow-up so long as the patient remained stable and normal sinus rhythm [MP]  2033 Patient's third troponin is still uptrending now up to 83.  This is most likely in the setting of electrocardioversion from earlier today.  I informed the patient and his wife of this result.  Considering uptrending troponin and significant risk factors, will admit to medicine for continued monitoring on telemetry and to trend troponins to endpoint.  Repeat EKG shows normal sinus rhythm with no ischemic changes and the patient is resting comfortably and chest pain-free. [MP]    Clinical Course User Index [MP] Royanne Foots, DO [RP] Rondel Baton,  MD   Medical Decision Making I, Estelle June DO, have assumed care of this patient from the previous provider pending delta troponin after cardioversion, cardiology consult and disposition  Amount and/or Complexity of Data Reviewed Labs: ordered. Radiology: ordered.  Risk Prescription drug management.   Final diagnosis SVT Palpitations Chest pain Shortness of breath       Royanne Foots, DO 12/26/22 2034

## 2022-12-26 NOTE — ED Provider Notes (Signed)
[place] EMERGENCY DEPARTMENT AT [facility] Provider Note   CSN: [id number] Arrival date & time: 12/26/22  1334     History  Chief Complaint  Patient presents with   Tachycardia    Lawrence Jordan is a 72 y.o. male.  72 year old male with a history of A-fib on Eliquis and metoprolol, symptomatic bradycardia status post pacemaker, CAD, CHF, hypertension, and hyperlipidemia who presents emergency department palpitations, shortness of breath, and chest discomfort.  An hour prior to arrival patient was at home and he started having palpitations and shortness of breath.  Also started developing some substernal chest discomfort.  In triage he was noted to have a heart rate above 200 and was brought back immediately.       Home Medications Prior to Admission medications   Medication Sig Start Date End Date Taking? Authorizing Provider  acetaminophen (TYLENOL) 500 MG tablet Take 1,000 mg by mouth every 8 (eight) hours as needed for moderate pain.   Yes [provider]  baclofen (LIORESAL) 10 MG tablet Take 10 mg by mouth at bedtime. 09/16/22  Yes [provider]  calcium carbonate (TUMS EX) 750 MG chewable tablet Chew 1,500 mg by mouth as needed.   Yes [provider]  Calcium Carbonate-Vitamin D (CALCIUM-D PO) Take 1 tablet by mouth daily.   Yes [provider]  docusate sodium (COLACE) 100 MG capsule Take 100 mg by mouth daily.   Yes [provider]  dofetilide (TIKOSYN) 250 MCG capsule TAKE 1 CAPSULE BY MOUTH 2 TIMES DAILY. 09/02/22  Yes Fenton, Clint R, PA  ELIQUIS 5 MG TABS tablet TAKE 1 TABLET BY MOUTH TWICE A DAY 08/13/22  Yes Lanier Prude, MD  ferrous sulfate 325 (65 FE) MG tablet Take 325 mg by mouth every other day.   Yes [provider]  furosemide (LASIX) 20 MG tablet TAKE 1 TABLET (20 MG TOTAL) BY MOUTH DAILY. MAY TAKE AN EXTRA TABLET DAILY AS NEEDED FOR WEIGHT GAIN 06/23/21  Yes Allred, Fayrene Fearing, MD  gabapentin  (NEURONTIN) 100 MG capsule Take 200 mg by mouth at bedtime. 09/16/22  Yes [provider]  insulin degludec (TRESIBA FLEXTOUCH) 200 UNIT/ML FlexTouch Pen Inject 66 Units into the skin daily. At dinner time 12/02/22  Yes Thapa, [place], MD  latanoprost (XALATAN) 0.005 % ophthalmic solution Place 1 drop into both eyes at bedtime.  11/14/17  Yes [provider]  levocetirizine (XYZAL) 5 MG tablet Take 5 mg by mouth every evening.   Yes [provider]  losartan (COZAAR) 50 MG tablet Take 50 mg by mouth daily. 06/25/21  Yes [provider]  magnesium oxide (MAG-OX) 400 MG tablet Take 1 tablet (400 mg total) by mouth 2 (two) times daily. 10/08/22  Yes Eustace Pen, PA-C  metFORMIN (GLUCOPHAGE) 1000 MG tablet TAKE 1 TABLET TWICE DAILY WITH FOOD, PLEASE MAKE FOLLOW-UP APPOINTMENT 06/23/21  Yes Reather Littler, MD  metoprolol succinate (TOPROL XL) 25 MG 24 hr tablet Take 1 tablet (25 mg total) by mouth daily. 09/09/22  Yes Mealor, Roberts Gaudy, MD  Multiple Vitamin (MULTIVITAMIN) tablet Take 1 tablet by mouth daily.     Yes [provider]  pantoprazole (PROTONIX) 40 MG tablet TAKE 1 TABLET (40 MG) BY MOUTH ONCE DAILY BEFORE A MEAL 07/30/22  Yes Tiffany Kocher, PA-C  potassium chloride SA (KLOR-CON M20) 20 MEQ tablet Take 3 tablets (60 mEq total) by mouth daily. Patient taking differently: Take 20 mEq by mouth 3 (three) times  daily. 10/07/22  Yes Eustace Pen, PA-C  Semaglutide, 2 MG/DOSE, (OZEMPIC, 2 MG/DOSE,) 8 MG/3ML SOPN Inject 2 mg weekly 11/29/22  Yes Thapa, [place], MD  simvastatin (ZOCOR) 40 MG tablet Take 40 mg by mouth daily.   Yes [provider]  tamsulosin (FLOMAX) 0.4 MG CAPS capsule Take 0.4 mg by mouth 2 (two) times daily. 03/29/19  Yes [provider]  traMADol (ULTRAM) 50 MG tablet Take 50 mg by mouth as needed. 05/14/22  Yes [provider]  Blood Glucose Monitoring Suppl (ONETOUCH VERIO) w/Device KIT Use to check blood sugar twice a  day 07/31/21   Reather Littler, MD  glucose blood (ONETOUCH VERIO) test strip USE TO CHECK BLOOD SUGAR 2 TIMES DAILY 08/18/22   Reather Littler, MD  Insulin Pen Needle (BD PEN NEEDLE NANO 2ND GEN) 32G X 4 MM MISC USE PEN NEEDLE AS INSTRUCTED TO INJECT INSULIN 4 TIMES DAILY. 08/20/22   Reather Littler, MD      Allergies    Iodinated contrast media, Sulfa antibiotics, Tape, and Amoxicillin    Review of Systems   Review of Systems  Physical Exam Updated Vital Signs BP 123/76   Pulse 71   Temp 98.3 F (36.8 C) (Oral)   Resp 15   Ht 5\' 11"  (1.803 m)   Wt (!) 149.7 kg   SpO2 95%   BMI 46.03 kg/m  Physical Exam Vitals and nursing note reviewed.  Constitutional:      General: He is in acute distress.     Appearance: He is well-developed. He is ill-appearing.  HENT:     Head: Normocephalic and atraumatic.     Right Ear: External ear normal.     Left Ear: External ear normal.     Nose: Nose normal.  Eyes:     Extraocular Movements: Extraocular movements intact.     Conjunctiva/sclera: Conjunctivae normal.     Pupils: Pupils are equal, round, and reactive to light.  Cardiovascular:     Rate and Rhythm: Regular rhythm. Tachycardia present.     Heart sounds: Normal heart sounds.  Pulmonary:     Effort: Pulmonary effort is normal. No respiratory distress.     Breath sounds: Normal breath sounds.  Musculoskeletal:     Cervical back: Normal range of motion and neck supple.     Right lower leg: Edema present.     Left lower leg: Edema present.  Skin:    General: Skin is warm and dry.  Neurological:     Mental Status: He is alert. Mental status is at baseline.  Psychiatric:        Mood and Affect: Mood normal.        Behavior: Behavior normal.     ED Results / Procedures / Treatments   Labs (all labs ordered are listed, but only abnormal results are displayed) Labs Reviewed  BASIC METABOLIC PANEL - Abnormal; Notable for the following components:      Result Value   CO2 20 (*)     Glucose, Bld 302 (*)    All other components within normal limits  MAGNESIUM - Abnormal; Notable for the following components:   Magnesium 1.5 (*)    All other components within normal limits  TROPONIN I (HIGH SENSITIVITY) - Abnormal; Notable for the following components:   Troponin I (High Sensitivity) 30 (*)    All other components within normal limits  TROPONIN I (HIGH SENSITIVITY) - Abnormal; Notable for the following components:   Troponin I (High  Sensitivity) 50 (*)    All other components within normal limits  CBC  TSH  BRAIN NATRIURETIC PEPTIDE    EKG EKG Interpretation Date/Time:  Sunday December 26 2022 14:11:38 EST Ventricular Rate:  196 PR Interval:    QRS Duration:  83 QT Interval:  225 QTC Calculation: 407 R Axis:   52  Text Interpretation: Atrial fibrillation with rapid V-rate Repolarization abnormality, prob rate related Confirmed by Vonita Moss [phone]) on 12/26/2022 2:27:56 PM  Radiology DG Chest Port 1 View  Result Date: 12/26/2022 CLINICAL DATA:  Atrial fibrillation EXAM: PORTABLE CHEST 1 VIEW COMPARISON:  X-ray 12/22/2022.  Older exams as well FINDINGS: Underinflation. Overlapping cardiac leads and defibrillator pads. Enlarged cardiopericardial silhouette with vascular congestion. Left upper chest pacemaker with leads along the right side of the heart. No pneumothorax, effusion or consolidation. Film is under penetrated. IMPRESSION: Enlarged cardiopericardial silhouette with vascular congestion. Pacemaker. Under penetrated radiograph.  Underinflation.  Overlapping artifacts Electronically Signed   By: Karen Kays M.D.   On: 12/26/2022 15:01    Procedures .Cardioversion  Date/Time: 12/26/2022 5:20 PM  Performed by: Rondel Baton, MD Authorized by: Rondel Baton, MD   Consent:    Consent obtained:  Emergent situation and verbal   Consent given by:  Patient and spouse   Risks discussed:  Cutaneous burn, death, induced arrhythmia and  pain Pre-procedure details:    Cardioversion basis:  Emergent   Rhythm:  Supraventricular tachycardia   Electrode placement:  Anterior-posterior Patient sedated: Yes. Refer to sedation procedure documentation for details of sedation.  Attempt one:    Cardioversion mode:  Synchronous   Waveform:  Monophasic   Shock (Joules):  120   Shock outcome:  Conversion to normal sinus rhythm Post-procedure details:    Patient status:  Awake   Patient tolerance of procedure:  Tolerated well, no immediate complications Comments:     Patient cardioverted with 120 J.  Did receive some etomidate to facilitate the procedure and avoid significant pain.      Medications Ordered in ED Medications  adenosine (ADENOCARD) 6 MG/2ML injection (  Not Given 12/26/22 1425)  adenosine (ADENOCARD) 6 MG/2ML injection 12 mg (12 mg Intravenous Not Given 12/26/22 1424)  adenosine (ADENOCARD) 6 MG/2ML injection (  Not Given 12/26/22 1425)  etomidate (AMIDATE) 2 MG/ML injection (  Not Given 12/26/22 1418)  adenosine (ADENOCARD) 6 MG/2ML injection 6 mg (6 mg Intravenous Given 12/26/22 1410)  etomidate (AMIDATE) injection 10 mg (10 mg Intravenous Given 12/26/22 1418)  magnesium sulfate IVPB 2 g 50 mL (0 g Intravenous Stopped 12/26/22 1657)    ED Course/ Medical Decision Making/ A&P Clinical Course as of 12/26/22 1724  Sun Dec 26, 2022  1425 Full code verified with the patient and his wife [RP]  1533 Signed out to Dr Elayne Snare.  [RP]  1629 Delta troponin elevated from initial value up to 50 from initial of 30 following cardioversion.  Patient has remained hemodynamically stable without current chest pain.  Will obtain third troponin and reach out to cardiology [MP]    Clinical Course User Index [MP] Royanne Foots, DO [RP] Rondel Baton, MD                                 Medical Decision Making Amount and/or Complexity of Data Reviewed Labs: ordered. Radiology: ordered.  Risk Prescription drug  management.   Lawrence Jordan is a 72 y.o.  male with comorbidities that complicate the patient evaluation including A-fib on Eliquis and metoprolol, symptomatic bradycardia status post pacemaker, CAD, CHF, hypertension, and hyperlipidemia who presents emergency department palpitations, shortness of breath, and chest discomfort.   Initial Ddx:  SVT, A-fib RVR, electrolyte abnormality, MI, PE  MDM/Course:  Patient presents to the emergency department with palpitations as well as chest pain and some shortness of breath.  Was found to be in a rapid rate that appears to be SVT.  Patient is already anticoagulated at this time so feel that PE is highly unlikely.  However given the rapid rate also very difficult to distinguish whether or not there could be small changes in the RR interval reflective of A-fib with RVR.  He was given 6 mg of adenosine which led to occasional sinus beats with multiple runs of NSVT.  Given that he had runs of nonsustained ventricular tachycardia I do not feel that additional medication would be in the patient's best interest so he was cardioverted using 120 J of electricity.  He then converted to normal sinus rhythm with occasional paced beats.  After waking the patient was saying that he is feeling much better.  Sleep made to believe that his chest discomfort was from his arrhythmia; however, did have elevated troponin and we are awaiting a repeat at this time.  Magnesium was also found to be low and was replenished.  Signed out to the oncoming physician awaiting repeat troponin and discussion with cardiology.  This patient presents to the ED for concern of complaints listed in HPI, this involves an extensive number of treatment options, and is a complaint that carries with it a high risk of complications and morbidity. Disposition including potential need for admission considered.   Dispo: Pending remainder of workup  Additional history obtained from spouse Records reviewed  Outpatient Clinic Notes The following labs were independently interpreted: Chemistry and show  hyperglycemia without DKA I independently reviewed the following imaging with scope of interpretation limited to determining acute life threatening conditions related to emergency care: Chest x-ray and agree with the radiologist interpretation with the following exceptions: none I personally reviewed and interpreted cardiac monitoring:  SVT I personally reviewed and interpreted the pt's EKG: see above for interpretation  I have reviewed the patients home medications and made adjustments as needed Social Determinants of health:  Elderly  Portions of this note were generated with Scientist, clinical (histocompatibility and immunogenetics). Dictation errors may occur despite best attempts at proofreading.    CRITICAL CARE Performed by: Rondel Baton   Total critical care time: 60 minutes  Critical care time was exclusive of separately billable procedures and treating other patients.  Critical care was necessary to treat or prevent imminent or life-threatening deterioration.  Critical care was time spent personally by me on the following activities: development of treatment plan with patient and/or surrogate as well as nursing, discussions with consultants, evaluation of patient's response to treatment, examination of patient, obtaining history from patient or surrogate, ordering and performing treatments and interventions, ordering and review of laboratory studies, ordering and review of radiographic studies, pulse oximetry and re-evaluation of patient's condition.   Final Clinical Impression(s) / ED Diagnoses Final diagnoses:  SVT (supraventricular tachycardia) (HCC)  Palpitations  Chest pain, unspecified type  Shortness of breath    Rx / DC Orders ED Discharge Orders          Ordered    Amb referral to AFIB Clinic        12/26/22 1405  Rondel Baton, MD 12/26/22 [phone]

## 2022-12-26 NOTE — ED Notes (Signed)
Bedside cardioversion completed. 120J shocked delivered at 1419. Pt. HR now 69, sinus rhythm.

## 2022-12-26 NOTE — ED Notes (Signed)
[facility] staff called and stated only " event" was flutter". Stated pacemaker was working correctly and just need rate control. [facility] staff aware of cardioversion.EDP aware.

## 2022-12-26 NOTE — ED Triage Notes (Addendum)
Pt. Brought in by wife. Complaining of "high heart rate" on his pacemaker. Pt.'s HR is in the low 200's. Pt. Has Afib.

## 2022-12-27 ENCOUNTER — Encounter (HOSPITAL_COMMUNITY): Payer: Self-pay | Admitting: Internal Medicine

## 2022-12-27 DIAGNOSIS — G4733 Obstructive sleep apnea (adult) (pediatric): Secondary | ICD-10-CM | POA: Diagnosis not present

## 2022-12-27 DIAGNOSIS — I4819 Other persistent atrial fibrillation: Secondary | ICD-10-CM | POA: Diagnosis not present

## 2022-12-27 DIAGNOSIS — I2510 Atherosclerotic heart disease of native coronary artery without angina pectoris: Secondary | ICD-10-CM | POA: Diagnosis not present

## 2022-12-27 DIAGNOSIS — I21A1 Myocardial infarction type 2: Secondary | ICD-10-CM | POA: Diagnosis not present

## 2022-12-27 DIAGNOSIS — R7989 Other specified abnormal findings of blood chemistry: Secondary | ICD-10-CM | POA: Diagnosis not present

## 2022-12-27 DIAGNOSIS — Z794 Long term (current) use of insulin: Secondary | ICD-10-CM | POA: Diagnosis not present

## 2022-12-27 DIAGNOSIS — E1165 Type 2 diabetes mellitus with hyperglycemia: Secondary | ICD-10-CM | POA: Diagnosis not present

## 2022-12-27 DIAGNOSIS — I10 Essential (primary) hypertension: Secondary | ICD-10-CM | POA: Diagnosis not present

## 2022-12-27 DIAGNOSIS — I471 Supraventricular tachycardia, unspecified: Secondary | ICD-10-CM | POA: Diagnosis not present

## 2022-12-27 LAB — CBC
HCT: 42.9 % (ref 39.0–52.0)
Hemoglobin: 14.3 g/dL (ref 13.0–17.0)
MCH: 32.1 pg (ref 26.0–34.0)
MCHC: 33.3 g/dL (ref 30.0–36.0)
MCV: 96.4 fL (ref 80.0–100.0)
Platelets: 178 10*3/uL (ref 150–400)
RBC: 4.45 MIL/uL (ref 4.22–5.81)
RDW: 13.1 % (ref 11.5–15.5)
WBC: 8.7 10*3/uL (ref 4.0–10.5)
nRBC: 0 % (ref 0.0–0.2)

## 2022-12-27 LAB — MAGNESIUM: Magnesium: 1.8 mg/dL (ref 1.7–2.4)

## 2022-12-27 LAB — COMPREHENSIVE METABOLIC PANEL
ALT: 40 U/L (ref 0–44)
AST: 34 U/L (ref 15–41)
Albumin: 3.8 g/dL (ref 3.5–5.0)
Alkaline Phosphatase: 47 U/L (ref 38–126)
Anion gap: 13 (ref 5–15)
BUN: 14 mg/dL (ref 8–23)
CO2: 21 mmol/L — ABNORMAL LOW (ref 22–32)
Calcium: 9.3 mg/dL (ref 8.9–10.3)
Chloride: 103 mmol/L (ref 98–111)
Creatinine, Ser: 0.65 mg/dL (ref 0.61–1.24)
GFR, Estimated: >60 mL/min (ref >60)
Glucose, Bld: 139 mg/dL — ABNORMAL HIGH (ref 70–99)
Potassium: 3.8 mmol/L (ref 3.5–5.1)
Sodium: 137 mmol/L (ref 135–145)
Total Bilirubin: 0.6 mg/dL (ref <1.2)
Total Protein: 7.2 g/dL (ref 6.5–8.1)

## 2022-12-27 LAB — GLUCOSE, CAPILLARY: Glucose-Capillary: 142 mg/dL — ABNORMAL HIGH (ref 70–99)

## 2022-12-27 LAB — PHOSPHORUS: Phosphorus: 3.6 mg/dL (ref 2.5–4.6)

## 2022-12-27 NOTE — Plan of Care (Signed)
  Problem: Education: Goal: Knowledge of General Education information will improve Description: Including pain rating scale, medication(s)/side effects and non-pharmacologic comfort measures Outcome: Progressing   Problem: Health Behavior/Discharge Planning: Goal: Ability to manage health-related needs will improve Outcome: Progressing   Problem: Clinical Measurements: Goal: Ability to maintain clinical measurements within normal limits will improve Outcome: Progressing Goal: Diagnostic test results will improve Outcome: Progressing Goal: Respiratory complications will improve Outcome: Progressing Goal: Cardiovascular complication will be avoided Outcome: Progressing   Problem: Activity: Goal: Risk for activity intolerance will decrease Outcome: Progressing   Problem: Elimination: Goal: Will not experience complications related to urinary retention Outcome: Progressing

## 2022-12-27 NOTE — TOC CM/SW Note (Signed)
Transition of Care [facility]) - Inpatient Brief Assessment   Patient Details  Name: AZARI KADEN MRN: [id number] Date of Birth: 04-02-1950  [facility] of [facility]) CM/SW Contact:    Villa Herb, LCSWA Phone Number: 12/27/2022, 8:46 AM   Clinical Narrative: Transition of Care Department [facility]) has reviewed patient and no TOC needs have been identified at this time. We will continue to monitor patient advancement through interdisciplinary progression rounds. If new patient transition needs arise, please place a TOC consult.  Transition of Care Asessment: Insurance and Status: Insurance coverage has been reviewed Patient has primary care physician: Yes Home environment has been reviewed: from home Prior level of function:: independent Prior/Current Home Services: No current home services Social Determinants of Health Reivew: SDOH reviewed no interventions necessary Readmission risk has been reviewed: Yes Transition of care needs: no transition of care needs at this time

## 2022-12-27 NOTE — Progress Notes (Signed)
Patient has discharged orders, discharge teaching given and no further questions at this time. Pt wheeled down to main lobby by staff to vehicle accompanied by wife.

## 2022-12-27 NOTE — Discharge Summary (Signed)
Physician Discharge Summary  Lawrence Jordan ZOX:[id number] DOB: 1951-01-07 DOA: 12/26/2022  PCP: [name], MD  Admit date: 12/26/2022 Discharge date: 12/27/2022  9:54 AM  Admitted From: Home Disposition: Home Recommendations for Outpatient Follow-up:  Outpatient follow-up with PCP and cardiology in 1 to 2 weeks Check CMP and CBC at follow-up Please follow up on the following pending results: None  Home Health: Not indicated Equipment/Devices: Not indicated  Discharge Condition: Stable CODE STATUS: Full code  Follow-up Information     [name], MD. Schedule an appointment as soon as possible for a visit in 1 week(s).   Specialty: Internal Medicine Contact information: [address] [place] [address] [phone]                 Hospital course 72 y.o. male with medical history significant of hypertension, hyperlipidemia, A-fib on Eliquis, symptomatic bradycardia status post pacemaker, CAD who presents to the emergency department due to palpitations, chest discomfort and shortness of breath.  Patient complained of palpitations and shortness of breath which started about 1 hour prior to arrival to the ED, this was associated with discomfort in substernal chest and wife took him to the ED In ED, HR was 217.  Basic labs without significant finding other than hyperglycemia to 302.  Mg 1.5.  TSH was 2.4.  Troponin 30 > 50 > 83.  Magnesium 1.5.  Patient was given IV adenosine 6 mg without any improvement.he received additional 12 mg and converted to A-fib with RVR for which she had cardioversion with 120 J.  Eventually, he converted to normal sinus rhythm at the rate of 60 bpm with prolonged PR interval.  Cardiology was consulted by EDP and recommended discharging patient home with outpatient follow-up.  However, patient's troponin trended from 30 to 50 then to 80 for which admission was requested for overnight observation.   Patient troponin trended down to  75.  Remained asymptomatic and in normal sinus rhythm, and discharged the next day.  Patient to follow-up with his primary cardiologist and PCP.  See individual problem list below for more.   Problems addressed during this hospitalization Principal Problem:   SVT (supraventricular tachycardia) (HCC) Active Problems:   Essential hypertension   Coronary artery disease   Uncontrolled type 2 diabetes mellitus with hyperglycemia, with long-term current use of insulin (HCC)   OSA on CPAP   Persistent atrial fibrillation (HCC)   Mixed hyperlipidemia   Elevated troponin   GERD (gastroesophageal reflux disease)   Obesity, Class III, BMI 40-49.9 (morbid obesity) (HCC)   Hypomagnesemia   Glaucoma   Peripheral neuropathy   Chronic back pain   Myocardial infarction type 2 (HCC)              Time spent 35 minutes  Vital signs Vitals:   12/26/22 1945 12/26/22 2333 12/27/22 0533 12/27/22 0752  BP: 139/77 137/80 126/69 (!) 173/76  Pulse: 65 66 65 68  Temp:  98.1 F (36.7 C) 98.1 F (36.7 C) 98.3 F (36.8 C)  Resp: (!) 22 20 20  (!) 22  Height:      Weight:      SpO2: 95% 96% 95% 97%  TempSrc:  Oral Oral Oral  BMI (Calculated):         Discharge exam  GENERAL: No apparent distress.  Nontoxic. HEENT: MMM.  Vision and hearing grossly intact.  NECK: Supple.  No apparent JVD.  RESP:  No IWOB.  Fair aeration bilaterally. CVS:  RRR. Heart  sounds normal.  ABD/GI/GU: BS+. Abd soft, NTND.  MSK/EXT:  Moves extremities. No apparent deformity.  Trace BLE edema. SKIN: no apparent skin lesion or wound NEURO: Awake and alert. Oriented appropriately.  No apparent focal neuro deficit. PSYCH: Calm. Normal affect.   Discharge Instructions Discharge Instructions     Amb referral to AFIB Clinic   Complete by: As directed    Diet - low sodium heart healthy   Complete by: As directed    Diet Carb Modified   Complete by: As directed    Discharge instructions   Complete by: As directed     It has been a pleasure taking care of you!  You were hospitalized due to supraventricular tachycardia (rapid heart rate) and atrial fibrillation for which you have been treated with medications and cardioversion.  Your heart rate has normalized.  Please take your medications as prescribed.  Follow-up with your primary care doctor and cardiologist in 1 to 2 weeks or sooner if needed.   Take care,   Increase activity slowly   Complete by: As directed       Allergies as of 12/27/2022       Reactions   Iodinated Contrast Media Anaphylaxis, Other (See Comments), Shortness Of Breath   Sulfa Antibiotics Anaphylaxis, Rash, Other (See Comments)   Tape Other (See Comments)   Blisters PAPER TAPE ONLY   Amoxicillin Hives, Rash        Medication List     TAKE these medications    acetaminophen 500 MG tablet Commonly known as: TYLENOL Take 1,000 mg by mouth every 8 (eight) hours as needed for moderate pain.   baclofen 10 MG tablet Commonly known as: LIORESAL Take 10 mg by mouth at bedtime.   BD Pen Needle Nano 2nd Gen 32G X 4 MM Misc Generic drug: Insulin Pen Needle USE PEN NEEDLE AS INSTRUCTED TO INJECT INSULIN 4 TIMES DAILY.   calcium carbonate 750 MG chewable tablet Commonly known as: TUMS EX Chew 1,500 mg by mouth as needed.   CALCIUM-D PO Take 1 tablet by mouth daily.   docusate sodium 100 MG capsule Commonly known as: COLACE Take 100 mg by mouth daily.   Eliquis 5 MG Tabs tablet Generic drug: apixaban TAKE 1 TABLET BY MOUTH TWICE A DAY   ferrous sulfate 325 (65 FE) MG tablet Take 325 mg by mouth every other day.   furosemide 20 MG tablet Commonly known as: LASIX TAKE 1 TABLET (20 MG TOTAL) BY MOUTH DAILY. MAY TAKE AN EXTRA TABLET DAILY AS NEEDED FOR WEIGHT GAIN   gabapentin 100 MG capsule Commonly known as: NEURONTIN Take 200 mg by mouth at bedtime.   latanoprost 0.005 % ophthalmic solution Commonly known as: XALATAN Place 1 drop into both eyes at  bedtime.   levocetirizine 5 MG tablet Commonly known as: XYZAL Take 5 mg by mouth every evening.   losartan 50 MG tablet Commonly known as: COZAAR Take 50 mg by mouth daily.   magnesium oxide 400 MG tablet Commonly known as: MAG-OX Take 1 tablet (400 mg total) by mouth 2 (two) times daily.   metFORMIN 1000 MG tablet Commonly known as: GLUCOPHAGE TAKE 1 TABLET TWICE DAILY WITH FOOD, PLEASE MAKE FOLLOW-UP APPOINTMENT   metoprolol succinate 25 MG 24 hr tablet Commonly known as: Toprol XL Take 1 tablet (25 mg total) by mouth daily.   multivitamin tablet Take 1 tablet by mouth daily.   OneTouch Verio test strip Generic drug: glucose blood USE TO CHECK BLOOD  SUGAR 2 TIMES DAILY   OneTouch Verio w/Device Kit Use to check blood sugar twice a day   Ozempic (2 MG/DOSE) 8 MG/3ML Sopn Generic drug: Semaglutide (2 MG/DOSE) Inject 2 mg weekly   pantoprazole 40 MG tablet Commonly known as: PROTONIX TAKE 1 TABLET (40 MG) BY MOUTH ONCE DAILY BEFORE A MEAL   potassium chloride SA 20 MEQ tablet Commonly known as: Klor-Con M20 Take 3 tablets (60 mEq total) by mouth daily. What changed:  how much to take when to take this   simvastatin 40 MG tablet Commonly known as: ZOCOR Take 40 mg by mouth daily.   tamsulosin 0.4 MG Caps capsule Commonly known as: FLOMAX Take 0.4 mg by mouth 2 (two) times daily.   traMADol 50 MG tablet Commonly known as: ULTRAM Take 50 mg by mouth as needed.   Evaristo Bury FlexTouch 200 UNIT/ML FlexTouch Pen Generic drug: insulin degludec Inject 66 Units into the skin daily. At dinner time        Consultations: Cardiology consulted by EDP  Procedures/Studies: Cardioversion for atrial fibrillation   DG Chest Port 1 View  Result Date: 12/26/2022 CLINICAL DATA:  Atrial fibrillation EXAM: PORTABLE CHEST 1 VIEW COMPARISON:  X-ray 12/22/2022.  Older exams as well FINDINGS: Underinflation. Overlapping cardiac leads and defibrillator pads. Enlarged  cardiopericardial silhouette with vascular congestion. Left upper chest pacemaker with leads along the right side of the heart. No pneumothorax, effusion or consolidation. Film is under penetrated. IMPRESSION: Enlarged cardiopericardial silhouette with vascular congestion. Pacemaker. Under penetrated radiograph.  Underinflation.  Overlapping artifacts Electronically Signed   By: Karen Kays M.D.   On: 12/26/2022 15:01   CUP PACEART REMOTE DEVICE CHECK  Result Date: 11/30/2022 Scheduled remote reviewed. Normal device function.  1 HVR 10/28 @ 17:45, EGM appears retrograde NSVT, 7sec in duration, HR 198, > 20 beats, previously reviewed and routed 3 AMS, <31min, PAT Next remote 91 days. LA, CVRS      The results of significant diagnostics from this hospitalization (including imaging, microbiology, ancillary and laboratory) are listed below for reference.     Microbiology: No results found for this or any previous visit (from the past 240 hour(s)).   Labs:  CBC: Recent Labs  Lab 12/26/22 1402 12/27/22 0509  WBC 9.4 8.7  HGB 16.0 14.3  HCT 46.6 42.9  MCV 95.1 96.4  PLT 183 178   BMP &GFR Recent Labs  Lab 12/26/22 1402 12/27/22 0509  NA 136 137  K 4.3 3.8  CL 101 103  CO2 20* 21*  GLUCOSE 302* 139*  BUN 13 14  CREATININE 0.73 0.65  CALCIUM 9.8 9.3  MG 1.5* 1.8  PHOS  --  3.6   Estimated Creatinine Clearance: 124.1 mL/min (by C-G formula based on SCr of 0.65 mg/dL). Liver & Pancreas: Recent Labs  Lab 12/27/22 0509  AST 34  ALT 40  ALKPHOS 47  BILITOT 0.6  PROT 7.2  ALBUMIN 3.8   No results for input(s): "LIPASE", "AMYLASE" in the last 168 hours. No results for input(s): "AMMONIA" in the last 168 hours. Diabetic: No results for input(s): "HGBA1C" in the last 72 hours. Recent Labs  Lab 12/26/22 2323 12/27/22 0749  GLUCAP 242* 142*   Cardiac Enzymes: No results for input(s): "CKTOTAL", "CKMB", "CKMBINDEX", "TROPONINI" in the last 168 hours. No results for  input(s): "PROBNP" in the last 8760 hours. Coagulation Profile: No results for input(s): "INR", "PROTIME" in the last 168 hours. Thyroid Function Tests: Recent Labs    12/26/22 1402  TSH 2.437   Lipid Profile: No results for input(s): "CHOL", "HDL", "LDLCALC", "TRIG", "CHOLHDL", "LDLDIRECT" in the last 72 hours. Anemia Panel: No results for input(s): "VITAMINB12", "FOLATE", "FERRITIN", "TIBC", "IRON", "RETICCTPCT" in the last 72 hours. Urine analysis:    Component Value Date/Time   COLORURINE YELLOW 07/21/2022 1209   APPEARANCEUR CLEAR 07/21/2022 1209   LABSPEC 1.010 07/21/2022 1209   PHURINE 6.0 07/21/2022 1209   GLUCOSEU >=1000 (A) 07/21/2022 1209   HGBUR NEGATIVE 07/21/2022 1209   BILIRUBINUR NEGATIVE 07/21/2022 1209   KETONESUR NEGATIVE 07/21/2022 1209   PROTEINUR 30 (A) 03/05/2013 1052   UROBILINOGEN 0.2 07/21/2022 1209   NITRITE NEGATIVE 07/21/2022 1209   LEUKOCYTESUR NEGATIVE 07/21/2022 1209   Sepsis Labs: Invalid input(s): "PROCALCITONIN", "LACTICIDVEN"   SIGNED:  Almon Hercules, MD  [facility] 12/27/2022, 10:25 PM

## 2022-12-28 NOTE — Progress Notes (Signed)
Remote pacemaker transmission.   

## 2022-12-30 DIAGNOSIS — R3912 Poor urinary stream: Secondary | ICD-10-CM | POA: Diagnosis not present

## 2022-12-30 DIAGNOSIS — N529 Male erectile dysfunction, unspecified: Secondary | ICD-10-CM | POA: Diagnosis not present

## 2022-12-30 DIAGNOSIS — R3915 Urgency of urination: Secondary | ICD-10-CM | POA: Diagnosis not present

## 2022-12-31 ENCOUNTER — Ambulatory Visit: Payer: Medicare Other | Attending: Cardiovascular Disease | Admitting: Cardiovascular Disease

## 2022-12-31 ENCOUNTER — Encounter: Payer: Self-pay | Admitting: Cardiovascular Disease

## 2022-12-31 VITALS — BP 130/80 | HR 67 | Ht 71.0 in | Wt 336.4 lb

## 2022-12-31 DIAGNOSIS — I10 Essential (primary) hypertension: Secondary | ICD-10-CM | POA: Insufficient documentation

## 2022-12-31 MED ORDER — METOPROLOL SUCCINATE ER 25 MG PO TB24: 25.0000 mg | ORAL_TABLET | Freq: Two times a day (BID) | ORAL | 1 refills | Status: DC

## 2022-12-31 NOTE — Progress Notes (Signed)
    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  Since last being seen in our clinic, the patient reports doing reasonably well.    He was very fatigued due to recurrent anemia due to GI losses. This appears to be under control now. I had referred him for a Watchman, but the procedure was deferred since his GI bleed appeared resolved.  He presents today as an urgent visit.  He noted to an atypical atrial flutter with 2-1 ventricular conduction about a week ago.  He has been short of breath, fatigued.  Recently he has noticed some slight ankle edema.  He notes that he had a similar experience years ago that led to decompensated heart failure and a prolonged admission.  He has had 2 ablations for atrial fibrillation by [name].  The most recent was in October 2021.  Pulmonary vein isolation was reinforced and a posterior wall ablation performed.  [name] noted that the patient had a complex atypical flutter with variable cycle length that was not mappable.  DC cardioversion was scheduled for Tuesday, May 7.  The delay was due to the fact that the patient had taken [facility], increasing the risk of aspiration.  He underwent Tikosyn load in May 2024.  Despite the Tikosyn, he has had a few breakthrough episodes of atrial flutter, sometimes with rapid rates.  He had discontinue metoprolol, so this was resumed. He reports that he is doing well.   He was admitted on December 1 with severe tachycardia, heart rates in excess of 200 bpm.  This occurred at about noon.  He usually takes his metoprolol XL in the morning, but had delayed the dose and only taking it about 30 minutes before the onset of this episode.  EGM's most consistent with one-to-one flutter.  The arrhythmia was not responsive to adenosine.  He was eventually DC cardioverted with a single 100 J shock.    Physical Exam: Vitals:    12/31/22 1346  BP: 130/80  Pulse: 67  SpO2: 96%  Weight: (!) 336 lb 6.4 oz (152.6 kg)  Height: 5\' 11"  (1.803 m)     Gen: Appears comfortable, well-nourished CV: tachy, regular, + dependent edema. No murmurs, rubs. Basilar rales clear with deep breaths. No JVD sitting upright Pulm: breathing easily   Pacemaker interrogation- reviewed in detail today,  See PACEART report    Assessment and Plan:  Persistent afib and atrial flutter Now with atypical flutter with RVR He has had acute CHF in the past due to AF/flutter, Now on dofetilide, tolerating it well I reviewed labs from 12/2.  EKG today shows appropriate QTc Has not tolerated metoprolol in the past due to fatigue; tolerating 25 mg daily now With recent episode of one-to-one conduction we will increase metoprolol XL to 25 mg twice daily Chads2vasc score is 6.   Continue Eliquis 5 mg twice daily  Symptomatic sinus bradycardia  Normal pacemaker function See Pace Art report he is not device dependant today  HTN Stable No change required today  Anemia Secondary to GI bleeding/ iron deficiency anemia Follows closely with GI (Dr Marletta Lor)  Obesity Body mass index is 46.92 kg/m. Lifestyle modification advised  OSA Compliance with CPAP advised  Chronic diastolic dysfunction Stable No change required today  Follow-up 3 months  [name], MD  12/31/2022 2:07 PM

## 2022-12-31 NOTE — Patient Instructions (Addendum)
Medication Instructions:   Increase Toprol XL to 25mg  twice a day   Continue all other medications.     Labwork:  none  Testing/Procedures:  none  Follow-Up:  3 months   Any Other Special Instructions Will Be Listed Below (If Applicable).   If you need a refill on your cardiac medications before your next appointment, please call your pharma

## 2023-01-05 ENCOUNTER — Ambulatory Visit (HOSPITAL_COMMUNITY): Payer: Medicare Other | Admitting: Internal Medicine

## 2023-01-05 LAB — CUP PACEART INCLINIC DEVICE CHECK
Date Time Interrogation Session: 20241206153229
Implantable Lead Connection Status: 753985
Implantable Lead Connection Status: 753985
Implantable Lead Implant Date: 20221108
Implantable Lead Implant Date: 20221108
Implantable Lead Location: 753859
Implantable Lead Location: 753860
Implantable Pulse Generator Implant Date: 20221108
Pulse Gen Model: 2272
Pulse Gen Serial Number: 3968031
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
## 2023-01-06 DIAGNOSIS — I10 Essential (primary) hypertension: Secondary | ICD-10-CM | POA: Diagnosis not present

## 2023-01-06 DIAGNOSIS — I4891 Unspecified atrial fibrillation: Secondary | ICD-10-CM | POA: Diagnosis not present

## 2023-01-06 DIAGNOSIS — D649 Anemia, unspecified: Secondary | ICD-10-CM | POA: Diagnosis not present

## 2023-01-12 DIAGNOSIS — R7401 Elevation of levels of liver transaminase levels: Secondary | ICD-10-CM | POA: Diagnosis not present

## 2023-01-12 DIAGNOSIS — Z6841 Body Mass Index (BMI) 40.0 and over, adult: Secondary | ICD-10-CM | POA: Diagnosis not present

## 2023-01-12 DIAGNOSIS — N281 Cyst of kidney, acquired: Secondary | ICD-10-CM | POA: Diagnosis not present

## 2023-01-13 ENCOUNTER — Encounter: Payer: Self-pay | Admitting: Endocrinology

## 2023-01-14 NOTE — Telephone Encounter (Signed)
I would recommend to talk with primary care provider for the treatment of yeast infection.

## 2023-01-17 ENCOUNTER — Telehealth: Payer: Self-pay | Admitting: Internal Medicine

## 2023-01-17 NOTE — Telephone Encounter (Signed)
Pt is on the recall for January, but he called earlier to say that Dr Mayford Knife at [facility] said he has a blocked gallstone and his liver was enlarged and needed to follow up with [place]. I told him to have his PCP fax [place] a referral or notes. He is aware of OV on 1/8 with [facility]. Pt asked would he be ok [place] a few days or should he stick around. I told him once we get records from [facility] for the provider to review they could make recommendations about travel. [phone]

## 2023-01-17 NOTE — Telephone Encounter (Signed)
I would need to have access to the records to be able to provide recommendations.   Is he having any abdominal pain, n/v?

## 2023-01-17 NOTE — Telephone Encounter (Signed)
The proficient referral from PCP is in epic for review. See previous note.

## 2023-01-17 NOTE — Telephone Encounter (Signed)
FYI: Lawrence Jordan  °

## 2023-01-17 NOTE — Telephone Encounter (Signed)
Pt states that he has no abdominal pain or n/v. Pt wanted to let you know that every time he coughs or sneezes he has a severe pain in his chest.

## 2023-01-20 NOTE — Telephone Encounter (Signed)
Reviewed records. He has a large gallbladder stone at the neck of gallbladder but no signs of acute gallbladder infection or inflammation on u/s. Also with enlarged liver. Minimally elevated liver enzymes, AST/ALT. As long as he is asymptomatic with no abdominal pain, n/v, he does not need urgent evaluation. Recommend low fat diet as this can reduce gallbladder contractions and chance of lodged stone.

## 2023-01-24 NOTE — Telephone Encounter (Signed)
Pt is aware and appt is scheduled.

## 2023-01-25 DIAGNOSIS — E1169 Type 2 diabetes mellitus with other specified complication: Secondary | ICD-10-CM | POA: Diagnosis not present

## 2023-01-26 DIAGNOSIS — I10 Essential (primary) hypertension: Secondary | ICD-10-CM | POA: Diagnosis not present

## 2023-01-26 DIAGNOSIS — D649 Anemia, unspecified: Secondary | ICD-10-CM | POA: Diagnosis not present

## 2023-01-26 DIAGNOSIS — I4891 Unspecified atrial fibrillation: Secondary | ICD-10-CM | POA: Diagnosis not present

## 2023-01-28 ENCOUNTER — Encounter: Payer: Self-pay | Admitting: Endocrinology

## 2023-01-31 ENCOUNTER — Encounter: Payer: Self-pay | Admitting: Gastroenterology

## 2023-01-31 ENCOUNTER — Other Ambulatory Visit: Payer: Self-pay

## 2023-01-31 ENCOUNTER — Ambulatory Visit (INDEPENDENT_AMBULATORY_CARE_PROVIDER_SITE_OTHER): Payer: Medicare Other | Admitting: Gastroenterology

## 2023-01-31 ENCOUNTER — Telehealth: Payer: Self-pay | Admitting: Endocrinology

## 2023-01-31 VITALS — BP 150/87 | HR 68 | Temp 98.2°F | Ht 71.0 in | Wt 336.8 lb

## 2023-01-31 DIAGNOSIS — K2270 Barrett's esophagus without dysplasia: Secondary | ICD-10-CM | POA: Diagnosis not present

## 2023-01-31 DIAGNOSIS — R7989 Other specified abnormal findings of blood chemistry: Secondary | ICD-10-CM | POA: Diagnosis not present

## 2023-01-31 DIAGNOSIS — N281 Cyst of kidney, acquired: Secondary | ICD-10-CM | POA: Diagnosis not present

## 2023-01-31 DIAGNOSIS — R1011 Right upper quadrant pain: Secondary | ICD-10-CM | POA: Diagnosis not present

## 2023-01-31 DIAGNOSIS — K219 Gastro-esophageal reflux disease without esophagitis: Secondary | ICD-10-CM

## 2023-01-31 DIAGNOSIS — R160 Hepatomegaly, not elsewhere classified: Secondary | ICD-10-CM | POA: Diagnosis not present

## 2023-01-31 DIAGNOSIS — K760 Fatty (change of) liver, not elsewhere classified: Secondary | ICD-10-CM | POA: Insufficient documentation

## 2023-01-31 DIAGNOSIS — K5900 Constipation, unspecified: Secondary | ICD-10-CM

## 2023-01-31 DIAGNOSIS — E1165 Type 2 diabetes mellitus with hyperglycemia: Secondary | ICD-10-CM

## 2023-01-31 MED ORDER — OZEMPIC (2 MG/DOSE) 8 MG/3ML ~~LOC~~ SOPN: PEN_INJECTOR | SUBCUTANEOUS | 3 refills | Status: DC

## 2023-01-31 NOTE — Telephone Encounter (Addendum)
 MEDICATION: Ozempic  (2 MG/DOSE) Semaglutide , 2 MG/DOSE, (OZEMPIC , 2 MG/DOSE,) 8 MG/3ML SOPN  PHARMACY:    [id number] - [place], [place] - [address] (Ph: [phone])    HAS THE PATIENT CONTACTED THEIR PHARMACY?  Yes  IS THIS A 90 DAY SUPPLY : Yes  IS PATIENT OUT OF MEDICATION: Yes  IF NOT; HOW MUCH IS LEFT:   LAST APPOINTMENT DATE: @11 /07/2022  NEXT APPOINTMENT DATE:@2 /10/2023  DO WE HAVE YOUR PERMISSION TO LEAVE A DETAILED MESSAGE?: Yes  OTHER COMMENTS: Patient states that also an authorization is needed.  Patient does have separate prescription insurance.   **Let patient know to contact pharmacy at the end of the day to make sure medication is ready. **  ** Please notify patient to allow 48-72 hours to process**  **Encourage patient to contact the pharmacy for refills or they can request refills through [facility]**

## 2023-01-31 NOTE — Progress Notes (Signed)
 GI Office Note    Referring Provider: Trudy Vaughn FALCON, MD Primary Care Physician:  Trudy Vaughn FALCON, MD  Primary Gastroenterologist: Carlin POUR. Cindie, DO   Chief Complaint   Chief Complaint  Patient presents with   Follow-up    Here regarding enlarged liver and gallbladder stones    History of Present Illness   Lawrence Jordan is a 73 y.o. male presenting today to discuss recent U/S ordered by PCP.   Last seen 04/2022. 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   Recently had abdominal u/s due to having RUQ pain, worse with movement, coughin, sneezing. E was noted to have large gallbladder stone at the neck but no signs of acute gb infection or inflammation. Also noted to have enlarged liver. Minimally elelvated AST/ALT.   During his u/s, he reports significant pain in ruq when pressed on by the transducer. It was bad enough to cause nausea. He has noted worse pain in ruq with laying on his right side or with movement trying to get up or rolling over. At this time he has more of a constant dull ache but worse at times with movement. Denies postprandial component to his pain. He has been able to eat freely without any symptoms. Denies SOB, cough. He has not any other imaging such has chest xray. He also notes that for the past 6-8 weeks he has had right neck pain, with limited range of motion. History of back issues about 9 months ago, back knotting up, could see physical knot. Had MRI. Was seen by chiropractor, after multiple treatments, symptoms subsided.   Denies any GERD symptoms, BMs regular. Some days stools not as good as others. Can be solid or loose. Taking stool softener daily. Occasionally may skip 1-2 days without a BM. No melena, brbpr.   Patient states he had imaging of his liver in 2018. Was told he had fatty liver.   Patient has a pacemaker. States it was placed 12/02/20. He had MRI lumbar  spine 05/2022.    Recent admission for palpitations/SVTs/Afib, chest pain, sob.   Colonoscopy Sept 2023: large Grade 4 hemorrhoids, normal rectum, single sigmoid diverticulum and cecal polyp removed. Tubular adenoma.7 years surveillance.    EGD February 2023: Esophageal mucosal changes consistent with long segment Barrett's esophagus (findings consistent with Barrett's esophagus), single gastric polyp resected disease reactive gastropathy with foveolar hyperplasia consistent with hyperplastic polyp, negative for H. pylori).  Next EGD in 3 years.   Small bowel capsule study March 2023: Small bowel erosions, possible tiny AVMs in the small bowel.   Medications   Current Outpatient Medications  Medication Sig Dispense Refill   acetaminophen  (TYLENOL ) 500 MG tablet Take 1,000 mg by mouth every 8 (eight) hours as needed for moderate pain.     baclofen  (LIORESAL ) 10 MG tablet Take 10 mg by mouth at bedtime.     Blood Glucose Monitoring Suppl (ONETOUCH VERIO) w/Device KIT Use to check blood sugar twice a day 1 kit 0   calcium  carbonate (TUMS EX) 750 MG chewable tablet Chew 1,500 mg by mouth as needed.     Calcium  Carbonate-Vitamin D  (CALCIUM -D PO) Take 1 tablet by mouth daily.     docusate sodium  (COLACE) 100 MG capsule Take 100 mg by mouth daily.     dofetilide  (TIKOSYN ) 250 MCG capsule TAKE 1 CAPSULE BY MOUTH TWICE A DAY 180 capsule 1   ELIQUIS  5 MG TABS  tablet TAKE 1 TABLET BY MOUTH TWICE A DAY 180 tablet 1   ferrous sulfate 325 (65 FE) MG tablet Take 325 mg by mouth every other day.     furosemide  (LASIX ) 20 MG tablet TAKE 1 TABLET (20 MG TOTAL) BY MOUTH DAILY. MAY TAKE AN EXTRA TABLET DAILY AS NEEDED FOR WEIGHT GAIN 135 tablet 3   gabapentin  (NEURONTIN ) 100 MG capsule Take 200 mg by mouth at bedtime.     glucose blood (ONETOUCH VERIO) test strip USE TO CHECK BLOOD SUGAR 2 TIMES DAILY 100 strip 3   insulin  degludec (TRESIBA  FLEXTOUCH) 200 UNIT/ML FlexTouch Pen Inject 66 Units into the skin  daily. At dinner time (Patient taking differently: Inject 68 Units into the skin daily. At dinner time) 30 mL 4   Insulin  Pen Needle (BD PEN NEEDLE NANO 2ND GEN) 32G X 4 MM MISC USE PEN NEEDLE AS INSTRUCTED TO INJECT INSULIN  4 TIMES DAILY. 200 each 4   latanoprost  (XALATAN ) 0.005 % ophthalmic solution Place 1 drop into both eyes at bedtime.   11   levocetirizine (XYZAL) 5 MG tablet Take 5 mg by mouth every evening.     losartan  (COZAAR ) 50 MG tablet Take 50 mg by mouth daily.     magnesium  oxide (MAG-OX) 400 MG tablet Take 1 tablet (400 mg total) by mouth 2 (two) times daily. 60 tablet 9   metFORMIN  (GLUCOPHAGE ) 1000 MG tablet TAKE 1 TABLET TWICE DAILY WITH FOOD, PLEASE MAKE FOLLOW-UP APPOINTMENT 180 tablet 2   metoprolol  succinate (TOPROL  XL) 25 MG 24 hr tablet Take 1 tablet (25 mg total) by mouth 2 (two) times daily. 180 tablet 1   Multiple Vitamin (MULTIVITAMIN) tablet Take 1 tablet by mouth daily.       pantoprazole  (PROTONIX ) 40 MG tablet TAKE 1 TABLET (40 MG) BY MOUTH ONCE DAILY BEFORE A MEAL 90 tablet 3   potassium chloride  SA (KLOR-CON  M20) 20 MEQ tablet Take 3 tablets (60 mEq total) by mouth daily. (Patient taking differently: Take 20 mEq by mouth 3 (three) times daily.) 270 tablet 1   Semaglutide , 2 MG/DOSE, (OZEMPIC , 2 MG/DOSE,) 8 MG/3ML SOPN Inject 2 mg weekly 9 mL 3   simvastatin  (ZOCOR ) 40 MG tablet Take 40 mg by mouth daily.     tadalafil  (CIALIS ) 5 MG tablet Take 5 mg by mouth daily.     tamsulosin  (FLOMAX ) 0.4 MG CAPS capsule Take 0.4 mg by mouth 2 (two) times daily.     traMADol  (ULTRAM ) 50 MG tablet Take 50 mg by mouth as needed.     No current facility-administered medications for this visit.    Allergies   Allergies as of 01/31/2023 - Review Complete 01/31/2023  Allergen Reaction Noted   Iodinated contrast media Anaphylaxis, Other (See Comments), and Shortness Of Breath 07/19/1971   Sulfa antibiotics Anaphylaxis, Rash, and Other (See Comments) 04/17/2010   Tape Other  (See Comments) 02/05/2013   Amoxicillin Hives and Rash 02/19/2015      Review of Systems   General: Negative for anorexia, weight loss, fever, chills, fatigue, weakness. ENT: Negative for hoarseness, difficulty swallowing , nasal congestion. CV: Negative for chest pain, angina, palpitations, dyspnea on exertion, peripheral edema.  Respiratory: Negative for dyspnea at rest, dyspnea on exertion, cough, sputum, wheezing. See hpi GI: See history of present illness. GU:  Negative for dysuria, hematuria, urinary incontinence, urinary frequency, nocturnal urination.  Endo: Negative for unusual weight change.     Physical Exam   BP (!) 150/87 (BP Location: Right  Arm, Patient Position: Sitting, Cuff Size: Large)   Pulse 68   Temp 98.2 F (36.8 C) (Oral)   Ht 5' 11 (1.803 m)   Wt (!) 336 lb 12.8 oz (152.8 kg)   SpO2 96%   BMI 46.97 kg/m    General: Well-nourished, well-developed in no acute distress.  Eyes: No icterus. Mouth: Oropharyngeal mucosa moist and pink  Lungs: Clear to auscultation bilaterally.  Heart: Regular rate and rhythm, no murmurs rubs or gallops.  Abdomen: Bowel sounds are normal, nondistended, no hepatosplenomegaly or masses,  no abdominal bruits or hernia , no rebound or guarding. Limited due to body habitus. Tender over anterior lower right rib margin. Tender in ruq.  Rectal: not performed  Extremities: No lower extremity edema. No clubbing or deformities. Neuro: Alert and oriented x 4   Skin: Warm and dry, no jaundice.   Psych: Alert and cooperative, normal mood and affect.  Labs   Lab Results  Component Value Date   NA 137 12/27/2022   CL 103 12/27/2022   K 3.8 12/27/2022   CO2 21 (L) 12/27/2022   BUN 14 12/27/2022   CREATININE 0.65 12/27/2022   GFRNONAA >60 12/27/2022   CALCIUM  9.3 12/27/2022   PHOS 3.6 12/27/2022   ALBUMIN 3.8 12/27/2022   GLUCOSE 139 (H) 12/27/2022   Lab Results  Component Value Date   ALT 40 12/27/2022   AST 34 12/27/2022    ALKPHOS 47 12/27/2022   BILITOT 0.6 12/27/2022   Lab Results  Component Value Date   WBC 8.7 12/27/2022   HGB 14.3 12/27/2022   HCT 42.9 12/27/2022   MCV 96.4 12/27/2022   PLT 178 12/27/2022   Lab Results  Component Value Date   TSH 2.437 12/26/2022    Imaging Studies   No results found.  Assessment/Plan:   Barrett's esophagus/GERD: -doing well, continue pantoprazole   Constipation: -stable, consider adding miralax two capfuls daily until soft stool, then continue once daily as needed  Hepatomegaly/elevated LFTs: liver measuring 19cm by u/s with fatty change noted. Patient reports imaging in 2018 but that is not available to me at this time. He has mildly elevated AST/ALT, with LFTs the week prior being normal. Will gather additional records. Anticipate further labs and possibly additional imaging. Not likely source of his RUQ pain.   RUQ pain: pain noted with coughing, sneezing, movement, with no postprandial component. Suggestive of potentially musculoskeletal source. Symptoms somewhat atypical of biliary etiology, although with recent bump in AST/ALT cannot exclude temporary biliary obstruction due to large gallstone at neck of gallbladder. Patient reports increased pain during u/s, report notes negative Murphy's sign. On exam today, he had tenderness over the right anterior ribs and in ruq.   Cholelithiasis: 8mm stone seen with shadowing in the neck of the gallbladder, additional sludge or smaller stones seen layering dependently. No findings of acute cholecystitis. No biliary dilation. His symptoms are somewhat atypical for biliary disease but with recent bump in AST/ALT cannot exclude temporary blockage by large gallstone at neck of gallbladder. Recommend low fat diet. May consider general surgery referral once further records reviewed and additional labs obtained. IF HE ENDS UP WITH GALLBLADDER SURGERY HE SHOULD REQUEST LIVER BIOPSY AT THE SAME TIME DUE TO HEPATOMEGALY.    Renal cysts: bilateral complicated renal cysts, may require additional imaging, requested previous records.   Sonny RAMAN. Ezzard, MHS, PA-C [facility]

## 2023-01-31 NOTE — Patient Instructions (Addendum)
 We will request any prior studies for comparison regarding your liver. Once reviewed, additional labs will be needed.  You will need additional imaging of kidney cysts. We will let you know if this can be done with liver work up or if you will need to see urologist.  Recommend low fat diet to decrease risk of pushing stones out of your gallbladder.  Consider using miralax two capfuls in 12 ounces of liquid daily until soft stool, then continue one capful daily as needed. Continue pantoprazole  40mg  daily before breakfast.

## 2023-02-01 ENCOUNTER — Telehealth: Payer: Self-pay | Admitting: Gastroenterology

## 2023-02-01 NOTE — Telephone Encounter (Signed)
 Reviewed 2018 records from [facility]  Abd U/S 01/2016:  Limited due to body habitus 2cm cyst mid to right upper kidney Fatty liver  CT A/P without contrast 01/2016: Scattered bilateral renal cysts Liver unremarkable  Recent bump in AST/ALT from normal.  Please let pt know that prior imaging did not report enlarged liver in 2018. He did appear to have fatty liver. Details about kidney cysts, appeared simple previously per report.   Need to start with labs.  CMET, CBC, PT/INR, iron /tibc/ferritin, Hep B surface antigen, Hep C antibody, hep B core total ab, hep b surface antibody.  Once labs are back, we will determine imaging needed.  He should let us  know if he has any worsening ruq pain in the meantime.

## 2023-02-02 ENCOUNTER — Ambulatory Visit: Payer: Medicare Other | Admitting: Gastroenterology

## 2023-02-02 ENCOUNTER — Other Ambulatory Visit: Payer: Self-pay

## 2023-02-02 DIAGNOSIS — Z6841 Body Mass Index (BMI) 40.0 and over, adult: Secondary | ICD-10-CM | POA: Diagnosis not present

## 2023-02-02 DIAGNOSIS — I4891 Unspecified atrial fibrillation: Secondary | ICD-10-CM | POA: Diagnosis not present

## 2023-02-02 DIAGNOSIS — K760 Fatty (change of) liver, not elsewhere classified: Secondary | ICD-10-CM | POA: Diagnosis not present

## 2023-02-02 DIAGNOSIS — N401 Enlarged prostate with lower urinary tract symptoms: Secondary | ICD-10-CM | POA: Diagnosis not present

## 2023-02-02 DIAGNOSIS — D500 Iron deficiency anemia secondary to blood loss (chronic): Secondary | ICD-10-CM

## 2023-02-02 DIAGNOSIS — E1165 Type 2 diabetes mellitus with hyperglycemia: Secondary | ICD-10-CM | POA: Diagnosis not present

## 2023-02-02 DIAGNOSIS — I10 Essential (primary) hypertension: Secondary | ICD-10-CM | POA: Diagnosis not present

## 2023-02-02 DIAGNOSIS — R7989 Other specified abnormal findings of blood chemistry: Secondary | ICD-10-CM

## 2023-02-02 NOTE — Telephone Encounter (Signed)
 Pt was made aware and verbalized understanding. Labs were ordered and faxed to patient's pcp office to have completed today when he goes for his appt there per pt's request.

## 2023-02-03 ENCOUNTER — Encounter: Payer: Self-pay | Admitting: Endocrinology

## 2023-02-08 DIAGNOSIS — K760 Fatty (change of) liver, not elsewhere classified: Secondary | ICD-10-CM | POA: Diagnosis not present

## 2023-02-08 DIAGNOSIS — D500 Iron deficiency anemia secondary to blood loss (chronic): Secondary | ICD-10-CM | POA: Diagnosis not present

## 2023-02-08 DIAGNOSIS — R7989 Other specified abnormal findings of blood chemistry: Secondary | ICD-10-CM | POA: Diagnosis not present

## 2023-02-09 ENCOUNTER — Encounter: Payer: Self-pay | Admitting: Cardiovascular Disease

## 2023-02-09 ENCOUNTER — Telehealth: Payer: Self-pay

## 2023-02-09 ENCOUNTER — Encounter: Payer: Self-pay | Admitting: Endocrinology

## 2023-02-09 ENCOUNTER — Other Ambulatory Visit (HOSPITAL_COMMUNITY): Payer: Self-pay

## 2023-02-09 DIAGNOSIS — I4819 Other persistent atrial fibrillation: Secondary | ICD-10-CM

## 2023-02-09 MED ORDER — APIXABAN 5 MG PO TABS
5.0000 mg | ORAL_TABLET | Freq: Two times a day (BID) | ORAL | 1 refills | Status: AC
Start: 2023-02-09 — End: ?

## 2023-02-09 NOTE — Telephone Encounter (Signed)
 Spoke with patient, eliquis  refills sent in to [facility] in [place]. Tikosyn  refills sent in last month when patient was seen [facility], confirmed with [facility]. No further needs at this time

## 2023-02-09 NOTE — Telephone Encounter (Signed)
 PA needed for Ozempic

## 2023-02-09 NOTE — Telephone Encounter (Signed)
 Pharmacy Patient Advocate Encounter   Received notification from Pt Calls Messages that prior authorization for Ozempic  is required/requested.   Insurance verification completed.   The patient is insured through [facility] .   Per test claim: PA required; PA submitted to above mentioned insurance via CoverMyMeds Key/confirmation #/EOC [id number] Status is pending

## 2023-02-10 ENCOUNTER — Ambulatory Visit: Payer: Medicare Other | Admitting: Orthopedic Surgery

## 2023-02-10 ENCOUNTER — Other Ambulatory Visit (HOSPITAL_COMMUNITY): Payer: Self-pay

## 2023-02-10 DIAGNOSIS — H401222 Low-tension glaucoma, left eye, moderate stage: Secondary | ICD-10-CM | POA: Diagnosis not present

## 2023-02-10 DIAGNOSIS — M1711 Unilateral primary osteoarthritis, right knee: Secondary | ICD-10-CM

## 2023-02-10 DIAGNOSIS — M170 Bilateral primary osteoarthritis of knee: Secondary | ICD-10-CM

## 2023-02-10 DIAGNOSIS — H401213 Low-tension glaucoma, right eye, severe stage: Secondary | ICD-10-CM | POA: Diagnosis not present

## 2023-02-10 DIAGNOSIS — M1712 Unilateral primary osteoarthritis, left knee: Secondary | ICD-10-CM

## 2023-02-10 DIAGNOSIS — H02125 Mechanical ectropion of left lower eyelid: Secondary | ICD-10-CM | POA: Diagnosis not present

## 2023-02-10 DIAGNOSIS — H25813 Combined forms of age-related cataract, bilateral: Secondary | ICD-10-CM | POA: Diagnosis not present

## 2023-02-10 DIAGNOSIS — H04123 Dry eye syndrome of bilateral lacrimal glands: Secondary | ICD-10-CM | POA: Diagnosis not present

## 2023-02-10 MED ORDER — METHYLPREDNISOLONE ACETATE 40 MG/ML IJ SUSP
40.0000 mg | Freq: Once | INTRAMUSCULAR | Status: AC
  Administered 2023-02-10: 40 mg via INTRA_ARTICULAR

## 2023-02-10 NOTE — Progress Notes (Signed)
Chief Complaint  Patient presents with   Injections    Bilat knees   73 year old male with some medical issues.  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  After cleaning the skin with alcohol a 20-gauge needle was used to inject the left knee joint. There were no complications. A sterile bandage was applied.   Procedure note right knee injection verbal consent was obtained to inject right knee joint  Timeout was completed to confirm the site of injection  The medications used were 40 mg depomedrol and 3 cc of 1% lidocaine  Anesthesia was provided by ethyl chloride and the skin was prepped with alcohol.  After cleaning the skin with alcohol a 20-gauge needle was used to inject the right knee joint. There were no complications. A sterile bandage was applied.

## 2023-02-10 NOTE — Telephone Encounter (Addendum)
Pharmacy Patient Advocate Encounter  Received notification from [facility] that Prior Authorization for Ozempic 2mg  has been APPROVED from 02/09/23 to 01/25/24. Ran test claim, Copay is $47 for 1 month and $141 for 3. This test claim was processed through [facility]- copay amounts may vary at other pharmacies due to pharmacy/plan contracts, or as the patient moves through the different stages of their insurance plan. Called the pharmacy to have it filled. They will have to order for tomorrow.   PA #/Case ID/Reference #: [id number]

## 2023-02-17 ENCOUNTER — Ambulatory Visit: Payer: Medicare Other | Admitting: Podiatry

## 2023-02-20 DIAGNOSIS — R1011 Right upper quadrant pain: Secondary | ICD-10-CM

## 2023-02-20 DIAGNOSIS — R7989 Other specified abnormal findings of blood chemistry: Secondary | ICD-10-CM

## 2023-02-20 DIAGNOSIS — N281 Cyst of kidney, acquired: Secondary | ICD-10-CM

## 2023-02-20 DIAGNOSIS — K8020 Calculus of gallbladder without cholecystitis without obstruction: Secondary | ICD-10-CM

## 2023-02-21 NOTE — Telephone Encounter (Signed)
Labs dated February 08, 2023 reviewed: White blood cell count 5800, hemoglobin 15.7, platelets 169,000, creatinine 0.78, INR 1.2, hepatitis B surface antibody nonreactive.  Page 3 scanned into the system, illegible.  We will need to obtain copy of recent labs again if we do not still have physical copies available for rescanning.  Please obtain asap.

## 2023-02-21 NOTE — Telephone Encounter (Signed)
Reviewed additional labs from the [place] site.  Dated February 08, 2023. TIBC 357, iron 132, iron sat 37%, ferritin 140, B12 479, folate 15.3, glucose 213, albumin 4.8, total bilirubin 0.4, alkaline phosphatase 64, AST 43 high, ALT 46 high, HCV antibody nonreactive, hepatitis B surface antigen negative, hepatitis B core antibody total negative.  Message sent to patient.

## 2023-02-21 NOTE — Addendum Note (Signed)
Addended by: Armstead Peaks on: 02/21/2023 03:02 PM   Modules accepted: Orders

## 2023-02-21 NOTE — Telephone Encounter (Signed)
They are viewable under the [facility] tab.

## 2023-02-21 NOTE — Telephone Encounter (Signed)
Please arrange for MRI abd/MRCP with and without contrast.  Dx: RUQ pain, gallstones, elevated LFTs, bilateral complicated kidney cysts on prior imagine.   He has a pacemaker but looks like he was able to have mri lumbar spine since pacemaker placement. Please make MRI aware so they can determine candidacy for MRI.   FYI he weighs 336, I think he should be fine for [facility] correct?

## 2023-03-01 ENCOUNTER — Ambulatory Visit (INDEPENDENT_AMBULATORY_CARE_PROVIDER_SITE_OTHER): Payer: Medicare Other

## 2023-03-01 DIAGNOSIS — I495 Sick sinus syndrome: Secondary | ICD-10-CM

## 2023-03-07 ENCOUNTER — Encounter: Payer: Self-pay | Admitting: Endocrinology

## 2023-03-07 ENCOUNTER — Ambulatory Visit (INDEPENDENT_AMBULATORY_CARE_PROVIDER_SITE_OTHER): Payer: Medicare Other | Admitting: Endocrinology

## 2023-03-07 VITALS — BP 146/82 | HR 86 | Ht 71.0 in | Wt 335.0 lb

## 2023-03-07 DIAGNOSIS — E1165 Type 2 diabetes mellitus with hyperglycemia: Secondary | ICD-10-CM | POA: Diagnosis not present

## 2023-03-07 DIAGNOSIS — Z794 Long term (current) use of insulin: Secondary | ICD-10-CM | POA: Diagnosis not present

## 2023-03-07 LAB — CUP PACEART REMOTE DEVICE CHECK
Battery Remaining Longevity: 85 mo
Battery Remaining Percentage: 80 %
Battery Voltage: 3.01 V
Brady Statistic AP VP Percent: 1.1 %
Brady Statistic AP VS Percent: 90 %
Brady Statistic AS VP Percent: 1 %
Brady Statistic AS VS Percent: 6.9 %
Brady Statistic RA Percent Paced: 90 %
Brady Statistic RV Percent Paced: 1.9 %
Date Time Interrogation Session: 20250208010247
Implantable Lead Connection Status: 753985
Implantable Lead Connection Status: 753985
Implantable Lead Implant Date: 20221108
Implantable Lead Implant Date: 20221108
Implantable Lead Location: 753859
Implantable Lead Location: 753860
Implantable Pulse Generator Implant Date: 20221108
Lead Channel Impedance Value: 440 Ohm
Lead Channel Impedance Value: 460 Ohm
Lead Channel Pacing Threshold Amplitude: 0.5 V
Lead Channel Pacing Threshold Amplitude: 0.5 V
Lead Channel Pacing Threshold Pulse Width: 0.5 ms
Lead Channel Pacing Threshold Pulse Width: 0.5 ms
Lead Channel Sensing Intrinsic Amplitude: 12 mV
Lead Channel Sensing Intrinsic Amplitude: 5 mV
Lead Channel Setting Pacing Amplitude: 2 V
Lead Channel Setting Pacing Amplitude: 2.5 V
Lead Channel Setting Pacing Pulse Width: 0.5 ms
Lead Channel Setting Sensing Sensitivity: 2 mV
Pulse Gen Model: 2272
Pulse Gen Serial Number: 3968031

## 2023-03-07 LAB — POCT GLYCOSYLATED HEMOGLOBIN (HGB A1C): Hemoglobin A1C: 8.3 % — AB (ref 4.0–5.6)

## 2023-03-07 MED ORDER — TRESIBA FLEXTOUCH 200 UNIT/ML ~~LOC~~ SOPN: 74.0000 [IU] | PEN_INJECTOR | Freq: Every day | SUBCUTANEOUS | 4 refills | Status: DC
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
## 2023-03-07 NOTE — Patient Instructions (Signed)
  Continue Ozempic  2 mg weekly. Continue metformin  1000 mg two times a day.  Increase Tresiba74 units daily.

## 2023-03-11 ENCOUNTER — Encounter: Payer: Self-pay | Admitting: Cardiovascular Disease

## 2023-03-11 ENCOUNTER — Other Ambulatory Visit: Payer: Self-pay | Admitting: Cardiovascular Disease

## 2023-03-11 MED ORDER — DOFETILIDE 250 MCG PO CAPS: 250.0000 ug | ORAL_CAPSULE | Freq: Two times a day (BID) | ORAL | 1 refills | Status: DC

## 2023-03-13 ENCOUNTER — Encounter: Payer: Self-pay | Admitting: Cardiovascular Disease

## 2023-03-21 ENCOUNTER — Ambulatory Visit (HOSPITAL_COMMUNITY): Admission: RE | Admit: 2023-03-21 | Payer: Medicare Other | Source: Ambulatory Visit

## 2023-03-21 ENCOUNTER — Other Ambulatory Visit (HOSPITAL_COMMUNITY): Payer: Medicare Other

## 2023-03-22 ENCOUNTER — Other Ambulatory Visit: Payer: Self-pay | Admitting: Gastroenterology

## 2023-03-22 ENCOUNTER — Ambulatory Visit (HOSPITAL_COMMUNITY)
Admission: RE | Admit: 2023-03-22 | Discharge: 2023-03-22 | Disposition: A | Discharge status: Home / Self Care | Payer: Medicare Other | Source: Ambulatory Visit | Attending: Gastroenterology | Admitting: Gastroenterology

## 2023-03-22 DIAGNOSIS — N281 Cyst of kidney, acquired: Secondary | ICD-10-CM

## 2023-03-22 DIAGNOSIS — R7989 Other specified abnormal findings of blood chemistry: Secondary | ICD-10-CM | POA: Diagnosis not present

## 2023-03-22 DIAGNOSIS — K8020 Calculus of gallbladder without cholecystitis without obstruction: Secondary | ICD-10-CM | POA: Diagnosis not present

## 2023-03-22 DIAGNOSIS — K760 Fatty (change of) liver, not elsewhere classified: Secondary | ICD-10-CM | POA: Diagnosis not present

## 2023-03-22 DIAGNOSIS — R1011 Right upper quadrant pain: Secondary | ICD-10-CM | POA: Diagnosis not present

## 2023-03-22 MED ORDER — GADOBUTROL 1 MMOL/ML IV SOLN
10.0000 mL | Freq: Once | INTRAVENOUS | Status: AC | PRN
  Administered 2023-03-22: 10 mL via INTRAVENOUS

## 2023-03-29 DIAGNOSIS — H04123 Dry eye syndrome of bilateral lacrimal glands: Secondary | ICD-10-CM | POA: Diagnosis not present

## 2023-03-31 DIAGNOSIS — M79644 Pain in right finger(s): Secondary | ICD-10-CM | POA: Diagnosis not present

## 2023-03-31 DIAGNOSIS — L03011 Cellulitis of right finger: Secondary | ICD-10-CM | POA: Diagnosis not present

## 2023-03-31 DIAGNOSIS — Z6841 Body Mass Index (BMI) 40.0 and over, adult: Secondary | ICD-10-CM | POA: Diagnosis not present

## 2023-04-01 ENCOUNTER — Encounter: Payer: Self-pay | Admitting: Cardiovascular Disease

## 2023-04-01 ENCOUNTER — Ambulatory Visit: Payer: Medicare Other | Attending: Cardiovascular Disease | Admitting: Cardiovascular Disease

## 2023-04-01 VITALS — BP 168/110 | HR 68 | Ht 71.0 in | Wt 343.2 lb

## 2023-04-01 DIAGNOSIS — I4819 Other persistent atrial fibrillation: Secondary | ICD-10-CM | POA: Insufficient documentation

## 2023-04-01 LAB — CUP PACEART INCLINIC DEVICE CHECK
Date Time Interrogation Session: 20250307104706
Implantable Lead Connection Status: 753985
Implantable Lead Connection Status: 753985
Implantable Lead Implant Date: 20221108
Implantable Lead Implant Date: 20221108
Implantable Lead Location: 753859
Implantable Lead Location: 753860
Implantable Pulse Generator Implant Date: 20221108
Pulse Gen Model: 2272
Pulse Gen Serial Number: 3968031

## 2023-04-01 NOTE — Progress Notes (Signed)
    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  Since last being seen in our clinic, the patient reports doing reasonably well.    He was very fatigued due to recurrent anemia due to GI losses. This appears to be under control now. I had referred him for a Watchman, but the procedure was deferred since his GI bleed appeared resolved.  He has had 2 ablations for atrial fibrillation by Dr. Johney Jordan.  The most recent was in October 2021.  Pulmonary vein isolation was reinforced and a posterior wall ablation performed.  Dr. Johney Jordan noted that the patient had a complex atypical flutter with variable cycle length that was not mappable.  DC cardioversion was scheduled for Tuesday, May 7.  The delay was due to the fact that the patient had taken [facility], increasing the risk of aspiration.  He underwent Tikosyn load in May 2024.  Despite the Tikosyn, he has had a few breakthrough episodes of atrial flutter, sometimes with rapid rates.  He had discontinue metoprolol, so this was resumed. He reports that he is doing well.   He was admitted on December 26, 2022  with severe tachycardia, heart rates in excess of 200 bpm.  He had missed a dose of metoprolol EGM's most consistent with one-to-one flutter.  The arrhythmia was not responsive to adenosine.  He was eventually DC cardioverted with a single 100 J shock.  Today, he presents for routine follow-up.  Since his last visit with the change of metoprolol, he has had only 112-second episode of atrial arrhythmia since his last visit.  Ventricular rate was controlled.  Physical Exam: Vitals:   04/01/23 1039 04/01/23 1046  BP: (!) 178/110 (!) 168/110  Pulse: 68   SpO2: 94%   Weight: (!) 343 lb 3.2 oz (155.7 kg)   Height: 5\' 11"  (1.803 m)      Gen: Appears comfortable, well-nourished CV: tachy, regular, + dependent edema. No murmurs, rubs. Basilar rales  clear with deep breaths. No JVD sitting upright Pulm: breathing easily   Pacemaker interrogation- reviewed in detail today,  See PACEART report    Assessment and Plan:  Persistent afib and atrial flutter Now with atypical flutter with RVR He has had acute CHF in the past due to AF/flutter, Now on dofetilide, tolerating it well I reviewed labs from 12/2.  EKG today shows appropriate QTc Has not tolerated metoprolol in the past due to fatigue; tolerating succinated 25 mg  twice daily well Chads2vasc score is 6.   Continue Eliquis 5 mg twice daily  Symptomatic sinus bradycardia  Normal pacemaker function See Pace Art report he is not device dependant today  HTN Stable No change required today  Anemia Secondary to GI bleeding/ iron deficiency anemia Follows closely with GI ([name])  Obesity Body mass index is 47.87 kg/m. Lifestyle modification advised  OSA Compliance with CPAP advised  Chronic diastolic dysfunction Stable No change required today  Follow-up 3 months  Lawrence Small, MD  04/01/2023 10:59 AM

## 2023-04-01 NOTE — Patient Instructions (Signed)
 Medication Instructions:  Continue all current medications.   Labwork: none  Testing/Procedures: none  Follow-Up: 6 months   Any Other Special Instructions Will Be Listed Below (If Applicable).   If you need a refill on your cardiac medications before your next appointment, please call your pharmacy.

## 2023-04-06 DIAGNOSIS — I10 Essential (primary) hypertension: Secondary | ICD-10-CM | POA: Diagnosis not present

## 2023-04-06 DIAGNOSIS — D500 Iron deficiency anemia secondary to blood loss (chronic): Secondary | ICD-10-CM | POA: Diagnosis not present

## 2023-04-06 DIAGNOSIS — E1165 Type 2 diabetes mellitus with hyperglycemia: Secondary | ICD-10-CM | POA: Diagnosis not present

## 2023-04-06 DIAGNOSIS — E1169 Type 2 diabetes mellitus with other specified complication: Secondary | ICD-10-CM | POA: Diagnosis not present

## 2023-04-06 DIAGNOSIS — R7401 Elevation of levels of liver transaminase levels: Secondary | ICD-10-CM | POA: Diagnosis not present

## 2023-04-07 NOTE — Progress Notes (Signed)
 Remote pacemaker transmission.

## 2023-04-07 NOTE — Addendum Note (Signed)
 Addended by: Geralyn Flash D on: 04/07/2023 11:40 AM   Modules accepted: Orders

## 2023-04-13 DIAGNOSIS — E7800 Pure hypercholesterolemia, unspecified: Secondary | ICD-10-CM | POA: Diagnosis not present

## 2023-04-13 DIAGNOSIS — Z6841 Body Mass Index (BMI) 40.0 and over, adult: Secondary | ICD-10-CM | POA: Diagnosis not present

## 2023-04-13 DIAGNOSIS — E1169 Type 2 diabetes mellitus with other specified complication: Secondary | ICD-10-CM | POA: Diagnosis not present

## 2023-04-13 DIAGNOSIS — I10 Essential (primary) hypertension: Secondary | ICD-10-CM | POA: Diagnosis not present

## 2023-04-13 DIAGNOSIS — I509 Heart failure, unspecified: Secondary | ICD-10-CM | POA: Diagnosis not present

## 2023-04-18 DIAGNOSIS — Z6841 Body Mass Index (BMI) 40.0 and over, adult: Secondary | ICD-10-CM | POA: Diagnosis not present

## 2023-04-18 DIAGNOSIS — L0390 Cellulitis, unspecified: Secondary | ICD-10-CM | POA: Diagnosis not present

## 2023-04-22 DIAGNOSIS — Z6841 Body Mass Index (BMI) 40.0 and over, adult: Secondary | ICD-10-CM | POA: Diagnosis not present

## 2023-04-22 DIAGNOSIS — L0390 Cellulitis, unspecified: Secondary | ICD-10-CM | POA: Diagnosis not present

## 2023-04-22 DIAGNOSIS — Z1389 Encounter for screening for other disorder: Secondary | ICD-10-CM | POA: Diagnosis not present

## 2023-04-22 DIAGNOSIS — Z0000 Encounter for general adult medical examination without abnormal findings: Secondary | ICD-10-CM | POA: Diagnosis not present

## 2023-04-22 DIAGNOSIS — Z1331 Encounter for screening for depression: Secondary | ICD-10-CM | POA: Diagnosis not present

## 2023-04-22 DIAGNOSIS — Z0001 Encounter for general adult medical examination with abnormal findings: Secondary | ICD-10-CM | POA: Diagnosis not present

## 2023-04-25 DIAGNOSIS — E1169 Type 2 diabetes mellitus with other specified complication: Secondary | ICD-10-CM | POA: Diagnosis not present

## 2023-04-25 DIAGNOSIS — I10 Essential (primary) hypertension: Secondary | ICD-10-CM | POA: Diagnosis not present

## 2023-04-26 DIAGNOSIS — Z6841 Body Mass Index (BMI) 40.0 and over, adult: Secondary | ICD-10-CM | POA: Diagnosis not present

## 2023-04-26 DIAGNOSIS — L0390 Cellulitis, unspecified: Secondary | ICD-10-CM | POA: Diagnosis not present

## 2023-04-27 ENCOUNTER — Emergency Department (HOSPITAL_COMMUNITY)

## 2023-04-27 ENCOUNTER — Other Ambulatory Visit: Payer: Self-pay

## 2023-04-27 ENCOUNTER — Emergency Department (HOSPITAL_COMMUNITY): Admission: EM | Admit: 2023-04-27 | Discharge: 2023-04-27 | Disposition: A | Discharge status: Home / Self Care

## 2023-04-27 ENCOUNTER — Telehealth (HOSPITAL_COMMUNITY): Payer: Self-pay | Admitting: *Deleted

## 2023-04-27 ENCOUNTER — Encounter (HOSPITAL_COMMUNITY): Payer: Self-pay

## 2023-04-27 DIAGNOSIS — I509 Heart failure, unspecified: Secondary | ICD-10-CM | POA: Insufficient documentation

## 2023-04-27 DIAGNOSIS — Z794 Long term (current) use of insulin: Secondary | ICD-10-CM | POA: Diagnosis not present

## 2023-04-27 DIAGNOSIS — I4891 Unspecified atrial fibrillation: Secondary | ICD-10-CM | POA: Diagnosis not present

## 2023-04-27 DIAGNOSIS — Z7901 Long term (current) use of anticoagulants: Secondary | ICD-10-CM | POA: Diagnosis not present

## 2023-04-27 DIAGNOSIS — R002 Palpitations: Secondary | ICD-10-CM | POA: Diagnosis present

## 2023-04-27 DIAGNOSIS — R079 Chest pain, unspecified: Secondary | ICD-10-CM | POA: Diagnosis not present

## 2023-04-27 LAB — BASIC METABOLIC PANEL WITH GFR
Anion gap: 14 (ref 5–15)
BUN: 11 mg/dL (ref 8–23)
CO2: 22 mmol/L (ref 22–32)
Calcium: 9.7 mg/dL (ref 8.9–10.3)
Chloride: 99 mmol/L (ref 98–111)
Creatinine, Ser: 0.68 mg/dL (ref 0.61–1.24)
GFR, Estimated: >60 mL/min (ref >60)
Glucose, Bld: 144 mg/dL — ABNORMAL HIGH (ref 70–99)
Potassium: 4.2 mmol/L (ref 3.5–5.1)
Sodium: 135 mmol/L (ref 135–145)

## 2023-04-27 LAB — CBC
HCT: 43.8 % (ref 39.0–52.0)
Hemoglobin: 15.2 g/dL (ref 13.0–17.0)
MCH: 32.3 pg (ref 26.0–34.0)
MCHC: 34.7 g/dL (ref 30.0–36.0)
MCV: 93.2 fL (ref 80.0–100.0)
Platelets: 164 10*3/uL (ref 150–400)
RBC: 4.7 MIL/uL (ref 4.22–5.81)
RDW: 12.2 % (ref 11.5–15.5)
WBC: 6.9 10*3/uL (ref 4.0–10.5)
nRBC: 0 % (ref 0.0–0.2)

## 2023-04-27 LAB — TROPONIN I (HIGH SENSITIVITY): Troponin I (High Sensitivity): 73 ng/L — ABNORMAL HIGH (ref <18)

## 2023-04-27 MED ORDER — ETOMIDATE 2 MG/ML IV SOLN
0.1000 mg/kg | Freq: Once | INTRAVENOUS | Status: AC
  Administered 2023-04-27: 15.6 mg via INTRAVENOUS
  Filled 2023-04-27: qty 10

## 2023-04-27 MED ORDER — METOPROLOL TARTRATE 5 MG/5ML IV SOLN
5.0000 mg | Freq: Once | INTRAVENOUS | Status: AC
Start: 2023-04-27 — End: 2023-04-27
  Administered 2023-04-27: 5 mg via INTRAVENOUS
  Filled 2023-04-27: qty 5

## 2023-04-27 NOTE — ED Provider Notes (Signed)
 [facility] AT [facility] Provider Note   CSN: [id number] Arrival date & time: 04/27/23  1706     History  Chief Complaint  Patient presents with   Chest Pain    Lawrence Jordan is a 73 y.o. male.  73 year old male with past medical history of atrial fibrillation on Eliquis as well as CHF presenting to the emergency department today with palpitations, chest tightness, and shortness of breath.  The patient has been having palpitations since this morning.  He does have a monitor that he wears and reportedly went into A-fib this morning.  He called his cardiologist and was apparently told to come to the ER for further evaluation.  He did take an extra dose of metoprolol which did not help.  He states his heart rate has been going between 49 and 200 bpm throughout the day today.   Chest Pain      Home Medications Prior to Admission medications   Medication Sig Start Date End Date Taking? Authorizing Provider  Cephalexin 500 MG tablet Take 500 mg by mouth 4 (four) times daily. 04/22/23  Yes [provider]  acetaminophen (TYLENOL) 500 MG tablet Take 1,000 mg by mouth every 8 (eight) hours as needed for moderate pain.    [provider]  apixaban (ELIQUIS) 5 MG TABS tablet Take 1 tablet (5 mg total) by mouth 2 (two) times daily. 02/09/23   Mealor, Roberts Gaudy, MD  baclofen (LIORESAL) 10 MG tablet Take 10 mg by mouth at bedtime. 09/16/22   [provider]  Blood Glucose Monitoring Suppl (ONETOUCH VERIO) w/Device KIT Use to check blood sugar twice a day 07/31/21   Reather Littler, MD  calcium carbonate (TUMS EX) 750 MG chewable tablet Chew 1,500 mg by mouth as needed.    [provider]  Calcium Carbonate-Vitamin D (CALCIUM-D PO) Take 1 tablet by mouth daily.    [provider]  docusate sodium (COLACE) 100 MG capsule Take 100 mg by mouth daily.    [provider]  dofetilide (TIKOSYN) 250 MCG capsule Take 1 capsule  (250 mcg total) by mouth 2 (two) times daily. 03/11/23   Mealor, Roberts Gaudy, MD  doxycycline (VIBRAMYCIN) 100 MG capsule Take 100 mg by mouth 2 (two) times daily. 04/13/23   [provider]  ferrous sulfate 325 (65 FE) MG tablet Take 325 mg by mouth every other day. Patient not taking: Reported on 04/01/2023    [provider]  furosemide (LASIX) 20 MG tablet TAKE 1 TABLET (20 MG TOTAL) BY MOUTH DAILY. MAY TAKE AN EXTRA TABLET DAILY AS NEEDED FOR WEIGHT GAIN 06/23/21   Allred, Fayrene Fearing, MD  gabapentin (NEURONTIN) 100 MG capsule Take 200 mg by mouth at bedtime. 09/16/22   [provider]  glucose blood (ONETOUCH VERIO) test strip USE TO CHECK BLOOD SUGAR 2 TIMES DAILY 08/18/22   Reather Littler, MD  insulin degludec (TRESIBA FLEXTOUCH) 200 UNIT/ML FlexTouch Pen Inject 74 Units into the skin daily. At dinner time 03/07/23   Thapa, [place], MD  Insulin Pen Needle (BD PEN NEEDLE NANO 2ND GEN) 32G X 4 MM MISC USE PEN NEEDLE AS INSTRUCTED TO INJECT INSULIN 4 TIMES DAILY. 08/20/22   Reather Littler, MD  latanoprost (XALATAN) 0.005 % ophthalmic solution Place 1 drop into both eyes at bedtime.  11/14/17   [provider]  levocetirizine (XYZAL) 5 MG tablet Take 5 mg by mouth every evening.    [provider]  losartan (COZAAR) 50  MG tablet Take 50 mg by mouth daily. 06/25/21   [provider]  magnesium oxide (MAG-OX) 400 MG tablet Take 1 tablet (400 mg total) by mouth 2 (two) times daily. 10/08/22   Eustace Pen, PA-C  metFORMIN (GLUCOPHAGE) 1000 MG tablet TAKE 1 TABLET TWICE DAILY WITH FOOD, PLEASE MAKE FOLLOW-UP APPOINTMENT 06/23/21   Reather Littler, MD  metoprolol succinate (TOPROL XL) 25 MG 24 hr tablet Take 1 tablet (25 mg total) by mouth 2 (two) times daily. 12/31/22   Mealor, Roberts Gaudy, MD  Multiple Vitamin (MULTIVITAMIN) tablet Take 1 tablet by mouth daily.      [provider]  pantoprazole (PROTONIX) 40 MG tablet TAKE 1 TABLET (40 MG) BY MOUTH ONCE DAILY BEFORE  A MEAL 07/30/22   Tiffany Kocher, PA-C  potassium chloride SA (KLOR-CON M20) 20 MEQ tablet Take 3 tablets (60 mEq total) by mouth daily. Patient taking differently: Take 20 mEq by mouth 3 (three) times daily. 10/07/22   Eustace Pen, PA-C  Semaglutide, 2 MG/DOSE, (OZEMPIC, 2 MG/DOSE,) 8 MG/3ML SOPN Inject 2 mg weekly 01/31/23   Thapa, [place], MD  sildenafil (VIAGRA) 100 MG tablet Take 100 mg by mouth as needed for erectile dysfunction. 04/24/23   [provider]  simvastatin (ZOCOR) 40 MG tablet Take 40 mg by mouth daily.    [provider]  tadalafil (CIALIS) 5 MG tablet Take 5 mg by mouth daily. 12/31/22   [provider]  tamsulosin (FLOMAX) 0.4 MG CAPS capsule Take 0.4 mg by mouth 2 (two) times daily. 03/29/19   [provider]  traMADol (ULTRAM) 50 MG tablet Take 50 mg by mouth as needed. 05/14/22   [provider]      Allergies    Iodinated contrast media, Sulfa antibiotics, Tape, and Amoxicillin    Review of Systems   Review of Systems  Cardiovascular:  Positive for chest pain.  All other systems reviewed and are negative.   Physical Exam Updated Vital Signs BP 136/85   Pulse 65   Temp 97.8 F (36.6 C) (Axillary)   Resp 18   Ht 5\' 11"  (1.803 m)   Wt (!) 156 kg   SpO2 93%   BMI 47.97 kg/m  Physical Exam Vitals and nursing note reviewed.   Gen: NAD Eyes: PERRL, EOMI HEENT: no oropharyngeal swelling Neck: trachea midline Resp: clear to auscultation bilaterally Card: Tachycardic, irregular, no murmurs, rubs, or gallops Abd: nontender, nondistended Extremities: no calf tenderness, no edema Vascular: 2+ radial pulses bilaterally, 2+ DP pulses bilaterally Skin: no rashes Psyc: acting appropriately   ED Results / Procedures / Treatments   Labs (all labs ordered are listed, but only abnormal results are displayed) Labs Reviewed  BASIC METABOLIC PANEL WITH GFR - Abnormal; Notable for the following components:      Result Value    Glucose, Bld 144 (*)    All other components within normal limits  TROPONIN I (HIGH SENSITIVITY) - Abnormal; Notable for the following components:   Troponin I (High Sensitivity) 73 (*)    All other components within normal limits  CBC    EKG EKG Interpretation Date/Time:  Wednesday April 27 2023 17:27:19 EDT Ventricular Rate:  114 PR Interval:    QRS Duration:  84 QT Interval:  334 QTC Calculation: 460 R Axis:   33  Text Interpretation: Atrial flutter with variable A-V block with premature ventricular or aberrantly conducted complexes Abnormal ECG When compared with ECG of 01-Apr-2023 10:44, Atrial flutter has  replaced Electronic atrial pacemaker Vent. rate has increased BY  46 BPM Confirmed by Beckey Downing [phone]) on 04/27/2023 5:30:06 PM  Radiology DG Chest Port 1 View Result Date: 04/27/2023 CLINICAL DATA:  Chest pain EXAM: PORTABLE CHEST 1 VIEW COMPARISON:  12/26/2022 FINDINGS: Chronic interstitial thickening. Lungs are otherwise clear. No pneumothorax or pleural effusion. Cardiac size within limits. 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 Pulmonary vascularity is normal. No acute bone abnormality. IMPRESSION: No active disease. Electronically Signed   By: Helyn Numbers M.D.   On: 04/27/2023 19:26    Procedures .Sedation  Date/Time: 04/27/2023 10:14 PM  Performed by: Durwin Glaze, MD Authorized by: Durwin Glaze, MD   Consent:    Consent obtained:  Written   Consent given by:  Patient   Risks discussed:  Allergic reaction, prolonged hypoxia resulting in organ damage, dysrhythmia, prolonged sedation necessitating reversal, inadequate sedation, respiratory compromise necessitating ventilatory assistance and intubation, nausea and vomiting   Alternatives discussed:  Analgesia without sedation Universal protocol:    Immediately prior to procedure, a time out was called: yes   Indications:    Procedure performed:  Cardioversion Pre-sedation assessment:    Time since  last food or drink:  11:30   ASA classification: class 3 - patient with severe systemic disease     Mouth opening:  3 or more finger widths   Thyromental distance:  3 finger widths   Mallampati score:  II - soft palate, uvula, fauces visible   Neck mobility: normal     Pre-sedation assessments completed and reviewed: pre-procedure airway patency not reviewed and pre-procedure cardiovascular function not reviewed   A pre-sedation assessment was completed prior to the start of the procedure Immediate pre-procedure details:    Reviewed: vital signs, relevant labs/tests and NPO status     Verified: bag valve mask available, emergency equipment available, intubation equipment available, IV patency confirmed, oxygen available, reversal medications available and suction available   Procedure details (see MAR for exact dosages):    Preoxygenation:  Nasal cannula   Sedation:  Etomidate   Intended level of sedation: deep   Intra-procedure events: none     Total Provider sedation time (minutes):  15 Post-procedure details:   A post-sedation assessment was completed following the completion of the procedure.   Post-sedation assessments completed and reviewed: post-procedure airway patency not reviewed, post-procedure cardiovascular function not reviewed, post-procedure hydration status not reviewed, post-procedure mental status not reviewed, post-procedure nausea and vomiting status not reviewed, pain score not reviewed, post-procedure respiratory function not reviewed and post-procedure temperature not reviewed     Procedure completion:  Tolerated well, no immediate complications .Cardioversion  Date/Time: 04/27/2023 10:15 PM  Performed by: Durwin Glaze, MD Authorized by: Durwin Glaze, MD   Consent:    Consent obtained:  Written   Consent given by:  Patient   Risks discussed:  Cutaneous burn, death, induced arrhythmia and pain   Alternatives discussed:  No treatment, rate-control medication,  alternative treatment, observation, delayed treatment, anti-coagulation medication and referral Pre-procedure details:    Cardioversion basis:  Elective   Rhythm:  Atrial fibrillation   Electrode placement:  Anterior-posterior Patient sedated: Yes. Refer to sedation procedure documentation for details of sedation.  Attempt one:    Cardioversion mode:  Synchronous   Waveform:  Monophasic   Shock (Joules):  150   Shock outcome:  Conversion to normal sinus rhythm Post-procedure details:    Patient status:  Awake   Patient tolerance of procedure:  Tolerated well, no immediate complications     Medications Ordered in ED Medications  metoprolol tartrate (LOPRESSOR) injection 5 mg (5 mg Intravenous Given 04/27/23 1815)  etomidate (AMIDATE) injection 15.6 mg (15.6 mg Intravenous Given 04/27/23 1839)    ED Course/ Medical Decision Making/ A&P                                 Medical Decision Making 73 year old male with past medical history of CHF, hypertension, and diabetes presenting to the emergency department today with palpitations, chest tightness, and shortness of breath.  I will further evaluate the patient here with basic labs Wels and EKG, chest x-ray, and troponin for further evaluation for ACS, pulmonary edema, pulmonary filtration, pneumothorax.  I will call discussed this case with cardiology to see if they would like me to cardiovert him.  His EKG interpreted by me does show atrial fibrillation with RVR with nonspecific ST-T changes.  The patient's initial troponin was 73.  This was similar to his previous troponins most recently.  Second opponent is not ordered.  I did discuss this case with Dr. Jacinto Halim.  He recommends cardioversion.  This was completed here in the emergency department.  The patient returned back to his baseline afterwards.  He is back in a sinus rhythm on reassessment.  He is discharged with return precautions.  CRITICAL CARE Performed by: Durwin Glaze   Total  critical care time: 35 minutes  Critical care time was exclusive of separately billable procedures and treating other patients.  Critical care was necessary to treat or prevent imminent or life-threatening deterioration.  Critical care was time spent personally by me on the following activities: development of treatment plan with patient and/or surrogate as well as nursing, discussions with consultants, evaluation of patient's response to treatment, examination of patient, obtaining history from patient or surrogate, ordering and performing treatments and interventions, ordering and review of laboratory studies, ordering and review of radiographic studies, pulse oximetry and re-evaluation of patient's condition.   Amount and/or Complexity of Data Reviewed Labs: ordered. Radiology: ordered.  Risk Prescription drug management.           Final Clinical Impression(s) / ED Diagnoses Final diagnoses:  Atrial fibrillation with RVR [facility])    Rx / DC Orders ED Discharge Orders     None         Durwin Glaze, MD 04/27/23 2218

## 2023-04-27 NOTE — Telephone Encounter (Signed)
 Pt states HR between 49-115 now after dose of metoprolol - he will send transmission to see what his rhythm is.

## 2023-04-27 NOTE — Discharge Instructions (Signed)
 It looks like you are back in a sinus rhythm.  Please follow-up with your cardiologist.  Return to the ER for worsening symptoms.

## 2023-04-27 NOTE — Telephone Encounter (Signed)
 Pt with intermittent CP. He will plan to go to ER for urgent cardioversion.

## 2023-04-27 NOTE — ED Triage Notes (Signed)
 Pt arrived via [facility] c/o chest tightness and recurring Atrial Fibrillation. Pt reports having extensive cardiac Hx, pacemaker and reports calling cardiologist this morning and was advised to take extra metoprolol today.

## 2023-04-27 NOTE — Telephone Encounter (Signed)
 Lawrence Jordan

## 2023-04-27 NOTE — Telephone Encounter (Signed)
 Patient called in stating he went into afib/aflutter this morning. He took his medications about an hour ago with little response. HRs are varying between 65-200. Some intermittent CP and dizziness.  Discussed with Jorja Loa PA pt can attempt extra dose of metoprolol now but if no response within an hour or symptoms worsen proceed to ER as last episode of this did require cardioversion. Pt verbalized understanding.

## 2023-04-27 NOTE — ED Notes (Signed)
 Per Pt, he was concerned about his gallbladder triggering his symptoms.  Per note in Pts Chart from earlier today, Pt was recommended to have cardioversion.   Pt also reports in the past, adenosine was not effective, and he reports he has been cardioverted before.

## 2023-04-28 ENCOUNTER — Telehealth: Payer: Self-pay

## 2023-04-28 ENCOUNTER — Ambulatory Visit (HOSPITAL_COMMUNITY)
Admission: RE | Admit: 2023-04-28 | Discharge: 2023-04-28 | Disposition: A | Discharge status: Home / Self Care | Source: Ambulatory Visit | Attending: Internal Medicine | Admitting: Internal Medicine

## 2023-04-28 VITALS — BP 122/80 | HR 122 | Ht 71.0 in | Wt 331.8 lb

## 2023-04-28 DIAGNOSIS — I4891 Unspecified atrial fibrillation: Secondary | ICD-10-CM | POA: Diagnosis not present

## 2023-04-28 DIAGNOSIS — Z6841 Body Mass Index (BMI) 40.0 and over, adult: Secondary | ICD-10-CM | POA: Insufficient documentation

## 2023-04-28 DIAGNOSIS — I4819 Other persistent atrial fibrillation: Secondary | ICD-10-CM | POA: Diagnosis not present

## 2023-04-28 DIAGNOSIS — D6869 Other thrombophilia: Secondary | ICD-10-CM | POA: Diagnosis not present

## 2023-04-28 DIAGNOSIS — Z7901 Long term (current) use of anticoagulants: Secondary | ICD-10-CM | POA: Diagnosis not present

## 2023-04-28 DIAGNOSIS — Z823 Family history of stroke: Secondary | ICD-10-CM | POA: Diagnosis not present

## 2023-04-28 DIAGNOSIS — Z794 Long term (current) use of insulin: Secondary | ICD-10-CM | POA: Diagnosis not present

## 2023-04-28 DIAGNOSIS — I4892 Unspecified atrial flutter: Secondary | ICD-10-CM | POA: Insufficient documentation

## 2023-04-28 DIAGNOSIS — Z7985 Long-term (current) use of injectable non-insulin antidiabetic drugs: Secondary | ICD-10-CM | POA: Diagnosis not present

## 2023-04-28 DIAGNOSIS — Z7984 Long term (current) use of oral hypoglycemic drugs: Secondary | ICD-10-CM | POA: Insufficient documentation

## 2023-04-28 DIAGNOSIS — Z79899 Other long term (current) drug therapy: Secondary | ICD-10-CM | POA: Diagnosis not present

## 2023-04-28 MED ORDER — AMIODARONE HCL 200 MG PO TABS: ORAL_TABLET | ORAL | 2 refills | Status: DC

## 2023-04-28 NOTE — Telephone Encounter (Signed)
 Alert transmission received:  Aflutter with RVR on presenting. V rates 118-140's as of 04/28/23 at 227 am.  Cardioversion at ER yesterday converted to NSR now back in Aflutter.   LM for patient to call back ASAP.  Per R. Fenton PA in AF clinic:  Patient should take an extra metoprolol this morning and have him come in to AF Clinic at 3pm today. ER precautions in meantime if symptoms get worse.

## 2023-04-28 NOTE — Patient Instructions (Signed)
 STOP dofetilide (tikosyn)   Today through Monday - increase metoprolol to 50mg  twice a day then go back to normal dosing.    On Monday (April 7th) Amiodarone 200mg  twice a day for 30 days then reduce to once a day. Take with food

## 2023-04-28 NOTE — Telephone Encounter (Signed)
 Patient called back. He continues to be symptomatic and he will take the metoprolol and see Jorja Loa at [facility] today.
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
## 2023-04-29 ENCOUNTER — Encounter: Payer: Self-pay | Admitting: Cardiovascular Disease

## 2023-05-05 ENCOUNTER — Other Ambulatory Visit (HOSPITAL_COMMUNITY): Payer: Self-pay | Admitting: Internal Medicine

## 2023-05-06 ENCOUNTER — Ambulatory Visit (HOSPITAL_COMMUNITY)
Admission: RE | Admit: 2023-05-06 | Discharge: 2023-05-06 | Disposition: A | Discharge status: Home / Self Care | Source: Ambulatory Visit | Attending: Internal Medicine | Admitting: Internal Medicine

## 2023-05-06 ENCOUNTER — Encounter (HOSPITAL_COMMUNITY): Payer: Self-pay | Admitting: Internal Medicine

## 2023-05-06 VITALS — BP 110/88 | HR 148 | Ht 71.0 in | Wt 336.8 lb

## 2023-05-06 DIAGNOSIS — E119 Type 2 diabetes mellitus without complications: Secondary | ICD-10-CM | POA: Insufficient documentation

## 2023-05-06 DIAGNOSIS — D6869 Other thrombophilia: Secondary | ICD-10-CM | POA: Diagnosis not present

## 2023-05-06 DIAGNOSIS — I4819 Other persistent atrial fibrillation: Secondary | ICD-10-CM

## 2023-05-06 DIAGNOSIS — Z8249 Family history of ischemic heart disease and other diseases of the circulatory system: Secondary | ICD-10-CM | POA: Diagnosis not present

## 2023-05-06 DIAGNOSIS — E785 Hyperlipidemia, unspecified: Secondary | ICD-10-CM | POA: Diagnosis not present

## 2023-05-06 DIAGNOSIS — Z5181 Encounter for therapeutic drug level monitoring: Secondary | ICD-10-CM | POA: Diagnosis not present

## 2023-05-06 DIAGNOSIS — Z794 Long term (current) use of insulin: Secondary | ICD-10-CM | POA: Insufficient documentation

## 2023-05-06 DIAGNOSIS — G4733 Obstructive sleep apnea (adult) (pediatric): Secondary | ICD-10-CM | POA: Insufficient documentation

## 2023-05-06 DIAGNOSIS — Z950 Presence of cardiac pacemaker: Secondary | ICD-10-CM | POA: Insufficient documentation

## 2023-05-06 DIAGNOSIS — I5032 Chronic diastolic (congestive) heart failure: Secondary | ICD-10-CM | POA: Insufficient documentation

## 2023-05-06 DIAGNOSIS — E669 Obesity, unspecified: Secondary | ICD-10-CM | POA: Insufficient documentation

## 2023-05-06 DIAGNOSIS — I2510 Atherosclerotic heart disease of native coronary artery without angina pectoris: Secondary | ICD-10-CM | POA: Insufficient documentation

## 2023-05-06 DIAGNOSIS — Z8349 Family history of other endocrine, nutritional and metabolic diseases: Secondary | ICD-10-CM | POA: Diagnosis not present

## 2023-05-06 DIAGNOSIS — Z7984 Long term (current) use of oral hypoglycemic drugs: Secondary | ICD-10-CM | POA: Insufficient documentation

## 2023-05-06 DIAGNOSIS — I110 Hypertensive heart disease with heart failure: Secondary | ICD-10-CM | POA: Insufficient documentation

## 2023-05-06 DIAGNOSIS — Z7985 Long-term (current) use of injectable non-insulin antidiabetic drugs: Secondary | ICD-10-CM | POA: Diagnosis not present

## 2023-05-06 DIAGNOSIS — I4892 Unspecified atrial flutter: Secondary | ICD-10-CM | POA: Diagnosis not present

## 2023-05-06 DIAGNOSIS — Z7901 Long term (current) use of anticoagulants: Secondary | ICD-10-CM | POA: Insufficient documentation

## 2023-05-06 DIAGNOSIS — Z6841 Body Mass Index (BMI) 40.0 and over, adult: Secondary | ICD-10-CM | POA: Insufficient documentation

## 2023-05-06 DIAGNOSIS — Z79899 Other long term (current) drug therapy: Secondary | ICD-10-CM | POA: Diagnosis not present

## 2023-05-06 LAB — CBC
HCT: 42.7 % (ref 39.0–52.0)
Hemoglobin: 14.5 g/dL (ref 13.0–17.0)
MCH: 32.4 pg (ref 26.0–34.0)
MCHC: 34 g/dL (ref 30.0–36.0)
MCV: 95.3 fL (ref 80.0–100.0)
Platelets: 160 10*3/uL (ref 150–400)
RBC: 4.48 MIL/uL (ref 4.22–5.81)
RDW: 12.6 % (ref 11.5–15.5)
WBC: 5.6 10*3/uL (ref 4.0–10.5)
nRBC: 0 % (ref 0.0–0.2)

## 2023-05-06 LAB — BASIC METABOLIC PANEL WITH GFR
Anion gap: 12 (ref 5–15)
BUN: 10 mg/dL (ref 8–23)
CO2: 20 mmol/L — ABNORMAL LOW (ref 22–32)
Calcium: 9.2 mg/dL (ref 8.9–10.3)
Chloride: 105 mmol/L (ref 98–111)
Creatinine, Ser: 0.77 mg/dL (ref 0.61–1.24)
GFR, Estimated: >60 mL/min (ref >60)
Glucose, Bld: 177 mg/dL — ABNORMAL HIGH (ref 70–99)
Potassium: 4 mmol/L (ref 3.5–5.1)
Sodium: 137 mmol/L (ref 135–145)

## 2023-05-06 NOTE — Patient Instructions (Addendum)
 Increase metoprolol to 50mg  in the morning and 25mg  in the evening -- if blood pressure will allow (top number staying over 100) try increase to 50mg  twice a day until cardioversion. Day of cardioversion reduce back to 25mg  twice a day    Cardioversion scheduled for: Monday, May 5th   - Arrive at the [employer] and go to admitting at 8:00AM   - Do not eat or drink anything after midnight the night prior to your procedure.   - Take all your morning medication (except diabetic medications) with a sip of water prior to arrival.  - You will not be able to drive home after your procedure.    - Do NOT miss any doses of your blood thinner - if you should miss a dose please notify our office immediately.   - If you feel as if you go back into normal rhythm prior to scheduled cardioversion, please notify our office immediately.   If your procedure is canceled in the cardioversion suite you will be charged a cancellation fee.    Hold below medications 7 days prior to scheduled procedure/anesthesia.  Restart medication on the normal dosing day after scheduled procedure/anesthesia  Semaglutide (Ozempic) [facility])     For those patients who have a scheduled procedure/anesthesia on the same day of the week as their dose, hold the medication on the day of surgery.  They can take their scheduled dose the week before.  **Patients on the above medications scheduled for elective procedures that have not held the medication for the appropriate amount of time are at risk of cancellation or change in the anesthetic plan.
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
## 2023-05-16 DIAGNOSIS — H02125 Mechanical ectropion of left lower eyelid: Secondary | ICD-10-CM | POA: Diagnosis not present

## 2023-05-16 DIAGNOSIS — H401213 Low-tension glaucoma, right eye, severe stage: Secondary | ICD-10-CM | POA: Diagnosis not present

## 2023-05-16 DIAGNOSIS — H25813 Combined forms of age-related cataract, bilateral: Secondary | ICD-10-CM | POA: Diagnosis not present

## 2023-05-16 DIAGNOSIS — H401222 Low-tension glaucoma, left eye, moderate stage: Secondary | ICD-10-CM | POA: Diagnosis not present

## 2023-05-16 DIAGNOSIS — H04123 Dry eye syndrome of bilateral lacrimal glands: Secondary | ICD-10-CM | POA: Diagnosis not present

## 2023-05-18 ENCOUNTER — Encounter: Payer: Self-pay | Admitting: Internal Medicine

## 2023-05-18 ENCOUNTER — Other Ambulatory Visit: Payer: Self-pay | Admitting: *Deleted

## 2023-05-18 ENCOUNTER — Ambulatory Visit (INDEPENDENT_AMBULATORY_CARE_PROVIDER_SITE_OTHER): Admitting: Internal Medicine

## 2023-05-18 VITALS — BP 106/74 | HR 69 | Temp 98.0°F | Ht 71.0 in | Wt 339.1 lb

## 2023-05-18 DIAGNOSIS — K219 Gastro-esophageal reflux disease without esophagitis: Secondary | ICD-10-CM

## 2023-05-18 DIAGNOSIS — R1011 Right upper quadrant pain: Secondary | ICD-10-CM

## 2023-05-18 DIAGNOSIS — R7989 Other specified abnormal findings of blood chemistry: Secondary | ICD-10-CM | POA: Diagnosis not present

## 2023-05-18 DIAGNOSIS — K2270 Barrett's esophagus without dysplasia: Secondary | ICD-10-CM | POA: Diagnosis not present

## 2023-05-18 DIAGNOSIS — K760 Fatty (change of) liver, not elsewhere classified: Secondary | ICD-10-CM | POA: Diagnosis not present

## 2023-05-18 DIAGNOSIS — K8020 Calculus of gallbladder without cholecystitis without obstruction: Secondary | ICD-10-CM

## 2023-05-18 DIAGNOSIS — Z860101 Personal history of adenomatous and serrated colon polyps: Secondary | ICD-10-CM

## 2023-05-18 NOTE — Patient Instructions (Signed)
 I am going to order a blood test at [facility] to check your liver.  Will call with results.  I am going to refer you to Dr. Collene Dawson to discuss your gallstones.  If they remove your gallbladder, ideally they could perform liver biopsy at the same time.  Follow-up with me in 6 weeks.  It was very nice seeing you again today.  Dr. Mordechai April

## 2023-05-20 ENCOUNTER — Telehealth (HOSPITAL_COMMUNITY): Payer: Self-pay | Admitting: *Deleted

## 2023-05-20 LAB — NASH FIBROSURE(R) PLUS
ALPHA 2-MACROGLOBULINS, QN: 548 mg/dL — ABNORMAL HIGH (ref 110–276)
ALT (SGPT) P5P: 55 IU/L (ref 0–55)
AST (SGOT) P5P: 64 IU/L — ABNORMAL HIGH (ref 0–40)
Apolipoprotein A-1: 130 mg/dL (ref 101–178)
Bilirubin, Total: 0.3 mg/dL (ref 0.0–1.2)
Cholesterol, Total: 146 mg/dL (ref 100–199)
Fibrosis Score: 0.75 — ABNORMAL HIGH (ref 0.00–0.21)
GGT: 44 IU/L (ref 0–65)
Glucose: 192 mg/dL — ABNORMAL HIGH (ref 70–99)
Haptoglobin: 178 mg/dL (ref 34–355)
NASH Score: 0.91 — ABNORMAL HIGH (ref 0.00–0.25)
Steatosis Score: 0.71 — ABNORMAL HIGH (ref 0.00–0.40)
Triglycerides: 240 mg/dL — ABNORMAL HIGH (ref 0–149)

## 2023-05-20 NOTE — Telephone Encounter (Signed)
 Patient notified back in NSR and cardioversion canceled. Pt to reduce metoprolol  back to normal dosing of metoprolol  25mg  BID. Continue amiodarone  load until complete and keep scheduled follow up per Caesar Caster PA. Pt verbalized agreement.

## 2023-05-20 NOTE — Telephone Encounter (Signed)
 Reviewed transmission. Back is NSR.

## 2023-05-20 NOTE — Telephone Encounter (Signed)
 Pt called in stating he believes he is back in normal rhythm. Pt to send transmission to confirm. Will await transmission for next steps.

## 2023-05-24 ENCOUNTER — Ambulatory Visit: Admitting: Orthopedic Surgery

## 2023-05-25 DIAGNOSIS — E1169 Type 2 diabetes mellitus with other specified complication: Secondary | ICD-10-CM | POA: Diagnosis not present

## 2023-05-25 DIAGNOSIS — I10 Essential (primary) hypertension: Secondary | ICD-10-CM | POA: Diagnosis not present

## 2023-05-30 ENCOUNTER — Ambulatory Visit (HOSPITAL_COMMUNITY): Admission: RE | Admit: 2023-05-30 | Source: Home / Self Care | Admitting: Internal Medicine

## 2023-05-30 ENCOUNTER — Encounter (HOSPITAL_COMMUNITY): Admission: RE | Payer: Self-pay | Source: Home / Self Care

## 2023-05-30 LAB — CUP PACEART REMOTE DEVICE CHECK
Battery Remaining Longevity: 80 mo
Battery Remaining Percentage: 78 %
Battery Voltage: 3.01 V
Brady Statistic AP VP Percent: 1 %
Brady Statistic AP VS Percent: 86 %
Brady Statistic AS VP Percent: 1.2 %
Brady Statistic AS VS Percent: 11 %
Brady Statistic RA Percent Paced: 58 %
Brady Statistic RV Percent Paced: 2.1 %
Date Time Interrogation Session: 20250505024449
Implantable Lead Connection Status: 753985
Implantable Lead Connection Status: 753985
Implantable Lead Implant Date: 20221108
Implantable Lead Implant Date: 20221108
Implantable Lead Location: 753859
Implantable Lead Location: 753860
Implantable Pulse Generator Implant Date: 20221108
Lead Channel Impedance Value: 440 Ohm
Lead Channel Impedance Value: 450 Ohm
Lead Channel Pacing Threshold Amplitude: 0.5 V
Lead Channel Pacing Threshold Amplitude: 0.75 V
Lead Channel Pacing Threshold Pulse Width: 0.5 ms
Lead Channel Pacing Threshold Pulse Width: 0.5 ms
Lead Channel Sensing Intrinsic Amplitude: 11.3 mV
Lead Channel Sensing Intrinsic Amplitude: 5 mV
Lead Channel Setting Pacing Amplitude: 2 V
Lead Channel Setting Pacing Amplitude: 2.5 V
Lead Channel Setting Pacing Pulse Width: 0.5 ms
Lead Channel Setting Sensing Sensitivity: 2 mV
Pulse Gen Model: 2272
Pulse Gen Serial Number: 3968031

## 2023-05-30 SURGERY — CARDIOVERSION (CATH LAB)
Anesthesia: General

## 2023-05-31 ENCOUNTER — Ambulatory Visit (INDEPENDENT_AMBULATORY_CARE_PROVIDER_SITE_OTHER): Payer: Medicare Other

## 2023-05-31 DIAGNOSIS — R001 Bradycardia, unspecified: Secondary | ICD-10-CM | POA: Diagnosis not present

## 2023-06-07 ENCOUNTER — Ambulatory Visit: Payer: Medicare Other | Admitting: Endocrinology

## 2023-06-07 ENCOUNTER — Ambulatory Visit: Admitting: General Surgery

## 2023-06-07 ENCOUNTER — Ambulatory Visit (HOSPITAL_COMMUNITY)
Admission: RE | Admit: 2023-06-07 | Discharge: 2023-06-07 | Disposition: A | Discharge status: Home / Self Care | Source: Ambulatory Visit | Attending: Internal Medicine | Admitting: Internal Medicine

## 2023-06-07 VITALS — BP 142/94 | HR 68 | Ht 71.0 in | Wt 341.8 lb

## 2023-06-07 DIAGNOSIS — I4891 Unspecified atrial fibrillation: Secondary | ICD-10-CM | POA: Insufficient documentation

## 2023-06-07 DIAGNOSIS — D6869 Other thrombophilia: Secondary | ICD-10-CM | POA: Insufficient documentation

## 2023-06-07 DIAGNOSIS — I4819 Other persistent atrial fibrillation: Secondary | ICD-10-CM | POA: Diagnosis present

## 2023-06-07 DIAGNOSIS — Z5181 Encounter for therapeutic drug level monitoring: Secondary | ICD-10-CM | POA: Insufficient documentation

## 2023-06-07 DIAGNOSIS — Z79899 Other long term (current) drug therapy: Secondary | ICD-10-CM | POA: Insufficient documentation

## 2023-06-07 MED ORDER — AMIODARONE HCL 200 MG PO TABS: 200.0000 mg | ORAL_TABLET | Freq: Every day | ORAL | 2 refills | Status: DC

## 2023-06-07 MED ORDER — METOPROLOL TARTRATE 25 MG PO TABS: 25.0000 mg | ORAL_TABLET | Freq: Two times a day (BID) | ORAL | 3 refills | Status: DC | PRN

## 2023-06-07 MED ORDER — METOPROLOL SUCCINATE ER 25 MG PO TB24: ORAL_TABLET | ORAL | 2 refills | Status: DC
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
## 2023-06-07 NOTE — Patient Instructions (Signed)
 Continue metoprolol  succinate 50mg  in the morning and 25mg  in the evening  Metoprolol  tartrate 25mg  twice a day AS NEEDED for heart rates over 100 (make sure your blood pressure top number is over 100 as well)  Follow up with Sam Creighton in March 2026

## 2023-06-08 ENCOUNTER — Encounter: Payer: Self-pay | Admitting: General Surgery

## 2023-06-08 ENCOUNTER — Encounter: Payer: Self-pay | Admitting: *Deleted

## 2023-06-08 ENCOUNTER — Ambulatory Visit (INDEPENDENT_AMBULATORY_CARE_PROVIDER_SITE_OTHER): Admitting: General Surgery

## 2023-06-08 VITALS — BP 164/91 | HR 70 | Temp 98.0°F | Resp 20 | Ht 71.0 in | Wt 341.0 lb

## 2023-06-08 DIAGNOSIS — K8020 Calculus of gallbladder without cholecystitis without obstruction: Secondary | ICD-10-CM | POA: Insufficient documentation

## 2023-06-08 DIAGNOSIS — K760 Fatty (change of) liver, not elsewhere classified: Secondary | ICD-10-CM

## 2023-06-08 DIAGNOSIS — R7989 Other specified abnormal findings of blood chemistry: Secondary | ICD-10-CM | POA: Diagnosis not present

## 2023-06-08 NOTE — Patient Instructions (Addendum)
 Will get approval from cardiology for holding your Eliqius before surgery. Will plan for surgery June 4 and you would hold your Eliquis  6/2-6/4 for surgery.  You will not be able to take your Ozempic  on 06/27/2023 and can resume after surgery.
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
## 2023-06-10 ENCOUNTER — Ambulatory Visit: Payer: Self-pay | Admitting: Cardiovascular Disease

## 2023-06-10 ENCOUNTER — Telehealth: Payer: Self-pay | Admitting: *Deleted

## 2023-06-10 NOTE — Telephone Encounter (Signed)
   Pre-operative Risk Assessment    Patient Name: Lawrence Jordan  DOB: [birth year] MRN: [id number]   Date of last office visit: 04/01/23 DR. MEALOR Date of next office visit: 09/30/23 DR. MEALOR   Request for Surgical Clearance    Procedure:  XI ROBOTIC ASSISTED LAPAROSCOPIC CHOLECYSTECTOMY/LIVER Bx    Date of Surgery:  Clearance TBD                                Surgeon:  DR. Heidi Llamas BRIDGES Surgeon's Group or Practice Name:  [facility] Phone number:  [phone] Fax number:  [phone]   Type of Clearance Requested:   - Medical  - Pharmacy:  Hold Apixaban  (Eliquis ) x 2 DAYS PRIOR   Type of Anesthesia:  General    Additional requests/questions:    [name]   06/10/2023, 9:16 AM

## 2023-06-13 NOTE — Telephone Encounter (Signed)
 Patient with diagnosis of atrial fibrillation on Eliquis  for anticoagulation.    Procedure:  XI ROBOTIC ASSISTED LAPAROSCOPIC CHOLECYSTECTOMY/LIVER Bx     Date of Surgery:  Clearance TBD     CHA2DS2-VASc Score = 5   This indicates a 7.2% annual risk of stroke. The patient's score is based upon: CHF History: 1 HTN History: 1 Diabetes History: 1 Stroke History: 0 Vascular Disease History: 1 Age Score: 1 Gender Score: 0    CrCl > 100 Platelet count 160  Patient has not  had an Afib/aflutter ablation within the last 3 months or DCCV within the last 30 days  Per office protocol, patient can hold Eliquis  for 2 days prior to procedure.   Patient will not need bridging with Lovenox (enoxaparin) around procedure.  **This guidance is not considered finalized until pre-operative APP has relayed final recommendations.**

## 2023-06-14 ENCOUNTER — Telehealth: Payer: Self-pay
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
## 2023-06-14 NOTE — Telephone Encounter (Signed)
 Pt scheduled for VV on 06/24/23.

## 2023-06-14 NOTE — Telephone Encounter (Signed)
   Name: Lawrence Jordan  DOB: [birth year]  MRN: [id number]  Primary Cardiologist: None   Preoperative team, please contact this patient and set up a phone call appointment for further preoperative risk assessment. Please obtain consent and complete medication review. Thank you for your help.  I confirm that guidance regarding antiplatelet and oral anticoagulation therapy has been completed and, if necessary, noted below.  Per office protocol, patient can hold Eliquis  for 2 days prior to procedure.   Patient will not need bridging with Lovenox (enoxaparin) around procedure  I also confirmed the patient resides in the state of [place] . As per [facility] Medical Board telemedicine laws, the patient must reside in the state in which the provider is licensed.   Ava Boatman, NP 06/14/2023, 8:21 AM [facility]

## 2023-06-14 NOTE — Telephone Encounter (Signed)
 Med rec and consent done.     Patient Consent for Virtual Visit        Lawrence Jordan has provided verbal consent on 06/14/2023 for a virtual visit (video or telephone).   CONSENT FOR VIRTUAL VISIT FOR:  Lawrence Jordan  By participating in this virtual visit I agree to the following:  I hereby voluntarily request, consent and authorize [facility] and its employed or contracted physicians, physician assistants, nurse practitioners or other licensed health care professionals (the Practitioner), to provide me with telemedicine health care services (the "Services") as deemed necessary by the treating Practitioner. I acknowledge and consent to receive the Services by the Practitioner via telemedicine. I understand that the telemedicine visit will involve communicating with the Practitioner through live audiovisual communication technology and the disclosure of certain medical information by electronic transmission. I acknowledge that I have been given the opportunity to request an in-person assessment or other available alternative prior to the telemedicine visit and am voluntarily participating in the telemedicine visit.  I understand that I have the right to withhold or withdraw my consent to the use of telemedicine in the course of my care at any time, without affecting my right to future care or treatment, and that the Practitioner or I may terminate the telemedicine visit at any time. I understand that I have the right to inspect all information obtained and/or recorded in the course of the telemedicine visit and may receive copies of available information for a reasonable fee.  I understand that some of the potential risks of receiving the Services via telemedicine include:  Delay or interruption in medical evaluation due to technological equipment failure or disruption; Information transmitted may not be sufficient (e.g. poor resolution of images) to allow for appropriate medical  decision making by the Practitioner; and/or  In rare instances, security protocols could fail, causing a breach of personal health information.  Furthermore, I acknowledge that it is my responsibility to provide information about my medical history, conditions and care that is complete and accurate to the best of my ability. I acknowledge that Practitioner's advice, recommendations, and/or decision may be based on factors not within their control, such as incomplete or inaccurate data provided by me or distortions of diagnostic images or specimens that may result from electronic transmissions. I understand that the practice of medicine is not an exact science and that Practitioner makes no warranties or guarantees regarding treatment outcomes. I acknowledge that a copy of this consent can be made available to me via my patient portal [facility] MyChart), or I can request a printed copy by calling the office of [facility].    I understand that my insurance will be billed for this visit.   I have read or had this consent read to me. I understand the contents of this consent, which adequately explains the benefits and risks of the Services being provided via telemedicine.  I have been provided ample opportunity to ask questions regarding this consent and the Services and have had my questions answered to my satisfaction. I give my informed consent for the services to be provided through the use of telemedicine in my medical care

## 2023-06-14 NOTE — Telephone Encounter (Signed)
 1st attempt to reach pt regarding surgical clearance and the need for an TELE appointment.  Left pt a detailed message to call back and get that scheduled.
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
## 2023-06-15 NOTE — Addendum Note (Signed)
 Addended by: Cathalene Clipper on: 06/15/2023 10:24 AM   Modules accepted: Orders

## 2023-06-22 ENCOUNTER — Ambulatory Visit (INDEPENDENT_AMBULATORY_CARE_PROVIDER_SITE_OTHER): Admitting: Internal Medicine

## 2023-06-22 ENCOUNTER — Encounter: Payer: Self-pay | Admitting: Internal Medicine

## 2023-06-22 VITALS — BP 156/92 | HR 69 | Temp 97.8°F | Ht 71.5 in | Wt 345.8 lb

## 2023-06-22 DIAGNOSIS — K2270 Barrett's esophagus without dysplasia: Secondary | ICD-10-CM

## 2023-06-22 DIAGNOSIS — K8020 Calculus of gallbladder without cholecystitis without obstruction: Secondary | ICD-10-CM

## 2023-06-22 DIAGNOSIS — K760 Fatty (change of) liver, not elsewhere classified: Secondary | ICD-10-CM | POA: Diagnosis not present

## 2023-06-22 DIAGNOSIS — R7989 Other specified abnormal findings of blood chemistry: Secondary | ICD-10-CM | POA: Diagnosis not present

## 2023-06-22 DIAGNOSIS — K219 Gastro-esophageal reflux disease without esophagitis: Secondary | ICD-10-CM

## 2023-06-22 DIAGNOSIS — R1011 Right upper quadrant pain: Secondary | ICD-10-CM

## 2023-06-22 DIAGNOSIS — Z860101 Personal history of adenomatous and serrated colon polyps: Secondary | ICD-10-CM | POA: Diagnosis not present
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
## 2023-06-22 NOTE — Patient Instructions (Signed)
 Tentative plan to have your gallbladder removed and liver biopsy performed at the same time.  We will see what cardiology says.  If you do not go through with the surgery then let me know and I can order liver biopsy outpatient through interventional radiology.  Follow-up in 3 months.  It is always a pleasure seeing you.  Dr. Mordechai April

## 2023-06-24 ENCOUNTER — Encounter (HOSPITAL_COMMUNITY)
Admission: RE | Admit: 2023-06-24 | Discharge: 2023-06-24 | Disposition: A | Discharge status: Home / Self Care | Source: Ambulatory Visit | Attending: General Surgery | Admitting: General Surgery

## 2023-06-24 ENCOUNTER — Ambulatory Visit: Attending: Cardiology | Admitting: Nurse Practitioner

## 2023-06-24 DIAGNOSIS — E1169 Type 2 diabetes mellitus with other specified complication: Secondary | ICD-10-CM | POA: Diagnosis not present

## 2023-06-24 DIAGNOSIS — I10 Essential (primary) hypertension: Secondary | ICD-10-CM | POA: Diagnosis not present

## 2023-06-24 DIAGNOSIS — Z01810 Encounter for preprocedural cardiovascular examination: Secondary | ICD-10-CM | POA: Diagnosis not present

## 2023-06-24 NOTE — Progress Notes (Signed)
 Dr Margrette Shield reviewed cardiac history, no orders given.
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
## 2023-06-29 ENCOUNTER — Ambulatory Visit (HOSPITAL_COMMUNITY): Payer: Self-pay | Admitting: Anesthesiology

## 2023-06-29 ENCOUNTER — Encounter (HOSPITAL_COMMUNITY)
Admission: RE | Disposition: A | Discharge status: Home / Self Care | Payer: Self-pay | Source: Home / Self Care | Attending: General Surgery

## 2023-06-29 ENCOUNTER — Encounter (HOSPITAL_COMMUNITY): Payer: Self-pay | Admitting: General Surgery

## 2023-06-29 ENCOUNTER — Other Ambulatory Visit: Payer: Self-pay

## 2023-06-29 ENCOUNTER — Ambulatory Visit (HOSPITAL_COMMUNITY)
Admission: RE | Admit: 2023-06-29 | Discharge: 2023-06-29 | Disposition: A | Discharge status: Home / Self Care | Attending: General Surgery | Admitting: General Surgery

## 2023-06-29 DIAGNOSIS — Z6841 Body Mass Index (BMI) 40.0 and over, adult: Secondary | ICD-10-CM | POA: Diagnosis not present

## 2023-06-29 DIAGNOSIS — I110 Hypertensive heart disease with heart failure: Secondary | ICD-10-CM | POA: Diagnosis not present

## 2023-06-29 DIAGNOSIS — K8020 Calculus of gallbladder without cholecystitis without obstruction: Secondary | ICD-10-CM

## 2023-06-29 DIAGNOSIS — Z7901 Long term (current) use of anticoagulants: Secondary | ICD-10-CM | POA: Insufficient documentation

## 2023-06-29 DIAGNOSIS — I2510 Atherosclerotic heart disease of native coronary artery without angina pectoris: Secondary | ICD-10-CM | POA: Insufficient documentation

## 2023-06-29 DIAGNOSIS — K219 Gastro-esophageal reflux disease without esophagitis: Secondary | ICD-10-CM | POA: Insufficient documentation

## 2023-06-29 DIAGNOSIS — I5032 Chronic diastolic (congestive) heart failure: Secondary | ICD-10-CM | POA: Diagnosis not present

## 2023-06-29 DIAGNOSIS — Z7984 Long term (current) use of oral hypoglycemic drugs: Secondary | ICD-10-CM | POA: Diagnosis not present

## 2023-06-29 DIAGNOSIS — K760 Fatty (change of) liver, not elsewhere classified: Secondary | ICD-10-CM

## 2023-06-29 DIAGNOSIS — K8010 Calculus of gallbladder with chronic cholecystitis without obstruction: Secondary | ICD-10-CM | POA: Insufficient documentation

## 2023-06-29 DIAGNOSIS — I4891 Unspecified atrial fibrillation: Secondary | ICD-10-CM | POA: Diagnosis not present

## 2023-06-29 DIAGNOSIS — Z79899 Other long term (current) drug therapy: Secondary | ICD-10-CM | POA: Insufficient documentation

## 2023-06-29 DIAGNOSIS — R7989 Other specified abnormal findings of blood chemistry: Secondary | ICD-10-CM | POA: Diagnosis present

## 2023-06-29 DIAGNOSIS — I5023 Acute on chronic systolic (congestive) heart failure: Secondary | ICD-10-CM | POA: Diagnosis not present

## 2023-06-29 DIAGNOSIS — M1990 Unspecified osteoarthritis, unspecified site: Secondary | ICD-10-CM | POA: Diagnosis not present

## 2023-06-29 DIAGNOSIS — E6689 Other obesity not elsewhere classified: Secondary | ICD-10-CM | POA: Diagnosis not present

## 2023-06-29 DIAGNOSIS — I252 Old myocardial infarction: Secondary | ICD-10-CM | POA: Insufficient documentation

## 2023-06-29 DIAGNOSIS — Z950 Presence of cardiac pacemaker: Secondary | ICD-10-CM | POA: Diagnosis not present

## 2023-06-29 DIAGNOSIS — Z8261 Family history of arthritis: Secondary | ICD-10-CM | POA: Diagnosis not present

## 2023-06-29 DIAGNOSIS — K828 Other specified diseases of gallbladder: Secondary | ICD-10-CM | POA: Insufficient documentation

## 2023-06-29 DIAGNOSIS — E119 Type 2 diabetes mellitus without complications: Secondary | ICD-10-CM | POA: Diagnosis not present

## 2023-06-29 DIAGNOSIS — R0602 Shortness of breath: Secondary | ICD-10-CM | POA: Diagnosis not present

## 2023-06-29 DIAGNOSIS — D649 Anemia, unspecified: Secondary | ICD-10-CM | POA: Insufficient documentation

## 2023-06-29 DIAGNOSIS — K7401 Hepatic fibrosis, early fibrosis: Secondary | ICD-10-CM | POA: Diagnosis not present

## 2023-06-29 DIAGNOSIS — I4819 Other persistent atrial fibrillation: Secondary | ICD-10-CM | POA: Diagnosis not present

## 2023-06-29 DIAGNOSIS — G4733 Obstructive sleep apnea (adult) (pediatric): Secondary | ICD-10-CM | POA: Diagnosis not present

## 2023-06-29 DIAGNOSIS — K7581 Nonalcoholic steatohepatitis (NASH): Secondary | ICD-10-CM | POA: Insufficient documentation

## 2023-06-29 HISTORY — PX: LIVER BIOPSY: SHX301

## 2023-06-29 LAB — GLUCOSE, CAPILLARY
Glucose-Capillary: 190 mg/dL — ABNORMAL HIGH (ref 70–99)
Glucose-Capillary: 253 mg/dL — ABNORMAL HIGH (ref 70–99)

## 2023-06-29 SURGERY — CHOLECYSTECTOMY, ROBOT-ASSISTED, LAPAROSCOPIC
Anesthesia: General | Site: Liver

## 2023-06-29 MED ORDER — ORAL CARE MOUTH RINSE: 15.0000 mL | Freq: Once | OROMUCOSAL | Status: DC

## 2023-06-29 MED ORDER — CHLORHEXIDINE GLUCONATE 0.12 % MT SOLN: 15.0000 mL | Freq: Once | OROMUCOSAL | Status: DC

## 2023-06-29 MED ORDER — OXYCODONE HCL 5 MG PO TABS: 5.0000 mg | ORAL_TABLET | ORAL | 0 refills | Status: DC | PRN

## 2023-06-29 MED ORDER — SUGAMMADEX SODIUM 200 MG/2ML IV SOLN
INTRAVENOUS | Status: DC | PRN
  Administered 2023-06-29: 400 mg via INTRAVENOUS

## 2023-06-29 MED ORDER — PROPOFOL 10 MG/ML IV BOLUS
INTRAVENOUS | Status: AC
Start: 2023-06-29 — End: ?
  Filled 2023-06-29: qty 20

## 2023-06-29 MED ORDER — CHLORHEXIDINE GLUCONATE CLOTH 2 % EX PADS: 6.0000 | MEDICATED_PAD | Freq: Once | CUTANEOUS | Status: DC

## 2023-06-29 MED ORDER — ROCURONIUM BROMIDE 100 MG/10ML IV SOLN
INTRAVENOUS | Status: DC | PRN
  Administered 2023-06-29: 100 mg via INTRAVENOUS
  Administered 2023-06-29 (×2): 50 mg via INTRAVENOUS

## 2023-06-29 MED ORDER — CHLORHEXIDINE GLUCONATE 0.12 % MT SOLN
15.0000 mL | Freq: Once | OROMUCOSAL | Status: AC
  Administered 2023-06-29: 15 mL via OROMUCOSAL

## 2023-06-29 MED ORDER — PROPOFOL 10 MG/ML IV BOLUS
INTRAVENOUS | Status: DC | PRN
  Administered 2023-06-29: 150 mg via INTRAVENOUS
  Administered 2023-06-29: 50 mg via INTRAVENOUS

## 2023-06-29 MED ORDER — DEXTROSE 5 % IV SOLN: 3.0000 g | INTRAVENOUS | Status: DC

## 2023-06-29 MED ORDER — FENTANYL CITRATE PF 50 MCG/ML IJ SOSY
25.0000 ug | PREFILLED_SYRINGE | INTRAMUSCULAR | Status: DC | PRN
  Administered 2023-06-29: 50 ug via INTRAVENOUS
  Filled 2023-06-29: qty 1

## 2023-06-29 MED ORDER — DEXMEDETOMIDINE HCL IN NACL 80 MCG/20ML IV SOLN
INTRAVENOUS | Status: AC
Start: 2023-06-29 — End: ?
  Filled 2023-06-29: qty 20

## 2023-06-29 MED ORDER — OXYCODONE HCL 5 MG PO TABS
5.0000 mg | ORAL_TABLET | Freq: Once | ORAL | Status: AC | PRN
  Administered 2023-06-29: 5 mg via ORAL
  Filled 2023-06-29: qty 1

## 2023-06-29 MED ORDER — HEMOSTATIC AGENTS (NO CHARGE) OPTIME
TOPICAL | Status: DC | PRN
  Administered 2023-06-29 (×2): 1 via TOPICAL

## 2023-06-29 MED ORDER — DEXMEDETOMIDINE HCL IN NACL 80 MCG/20ML IV SOLN
INTRAVENOUS | Status: DC | PRN
  Administered 2023-06-29: 4 ug via INTRAVENOUS
  Administered 2023-06-29 (×2): 8 ug via INTRAVENOUS

## 2023-06-29 MED ORDER — SODIUM CHLORIDE 0.9 % IV SOLN
INTRAVENOUS | Status: AC
  Filled 2023-06-29: qty 3

## 2023-06-29 MED ORDER — CHLORHEXIDINE GLUCONATE CLOTH 2 % EX PADS
6.0000 | MEDICATED_PAD | Freq: Once | CUTANEOUS | Status: AC
  Administered 2023-06-29: 6 via TOPICAL

## 2023-06-29 MED ORDER — PHENYLEPHRINE 80 MCG/ML (10ML) SYRINGE FOR IV PUSH (FOR BLOOD PRESSURE SUPPORT)
PREFILLED_SYRINGE | INTRAVENOUS | Status: DC | PRN
  Administered 2023-06-29: 160 ug via INTRAVENOUS

## 2023-06-29 MED ORDER — SCOPOLAMINE 1 MG/3DAYS TD PT72
1.0000 | MEDICATED_PATCH | Freq: Once | TRANSDERMAL | Status: DC
  Administered 2023-06-29: 1.5 mg via TRANSDERMAL

## 2023-06-29 MED ORDER — BUPIVACAINE HCL (PF) 0.5 % IJ SOLN
INTRAMUSCULAR | Status: DC | PRN
Start: 2023-06-29 — End: 2023-06-29
  Administered 2023-06-29: 30 mL

## 2023-06-29 MED ORDER — PHENYLEPHRINE HCL-NACL 20-0.9 MG/250ML-% IV SOLN
INTRAVENOUS | Status: DC | PRN
  Administered 2023-06-29: 30 ug/min via INTRAVENOUS

## 2023-06-29 MED ORDER — STERILE WATER FOR IRRIGATION IR SOLN
Status: DC | PRN
  Administered 2023-06-29: 500 mL

## 2023-06-29 MED ORDER — VASOPRESSIN 20 UNIT/ML IV SOLN
INTRAVENOUS | Status: AC
Start: 2023-06-29 — End: ?
  Filled 2023-06-29: qty 1

## 2023-06-29 MED ORDER — DEXAMETHASONE SODIUM PHOSPHATE 10 MG/ML IJ SOLN
INTRAMUSCULAR | Status: AC
Start: 2023-06-29 — End: ?
  Filled 2023-06-29: qty 1

## 2023-06-29 MED ORDER — DEXAMETHASONE SODIUM PHOSPHATE 10 MG/ML IJ SOLN
INTRAMUSCULAR | Status: DC | PRN
  Administered 2023-06-29: 5 mg via INTRAVENOUS

## 2023-06-29 MED ORDER — FENTANYL CITRATE (PF) 100 MCG/2ML IJ SOLN
INTRAMUSCULAR | Status: AC
  Filled 2023-06-29: qty 2

## 2023-06-29 MED ORDER — LIDOCAINE 2% (20 MG/ML) 5 ML SYRINGE
INTRAMUSCULAR | Status: DC | PRN
  Administered 2023-06-29: 100 mg via INTRAVENOUS

## 2023-06-29 MED ORDER — LACTATED RINGERS IV SOLN: INTRAVENOUS | Status: DC

## 2023-06-29 MED ORDER — INDOCYANINE GREEN 25 MG IV SOLR
INTRAVENOUS | Status: DC
Start: 2023-06-29 — End: 2023-06-29
  Filled 2023-06-29: qty 10

## 2023-06-29 MED ORDER — ACETAMINOPHEN 10 MG/ML IV SOLN
1000.0000 mg | Freq: Once | INTRAVENOUS | Status: AC
  Administered 2023-06-29: 1000 mg via INTRAVENOUS
  Filled 2023-06-29: qty 100

## 2023-06-29 MED ORDER — LIDOCAINE 2% (20 MG/ML) 5 ML SYRINGE
INTRAMUSCULAR | Status: AC
  Filled 2023-06-29: qty 5

## 2023-06-29 MED ORDER — ONDANSETRON HCL 4 MG PO TABS: 4.0000 mg | ORAL_TABLET | Freq: Three times a day (TID) | ORAL | 1 refills | Status: DC | PRN

## 2023-06-29 MED ORDER — ONDANSETRON HCL 4 MG/2ML IJ SOLN
INTRAMUSCULAR | Status: AC
  Filled 2023-06-29: qty 2

## 2023-06-29 MED ORDER — ONDANSETRON HCL 4 MG/2ML IJ SOLN: 4.0000 mg | Freq: Once | INTRAMUSCULAR | Status: DC | PRN

## 2023-06-29 MED ORDER — PROPOFOL 10 MG/ML IV BOLUS
INTRAVENOUS | Status: AC
  Filled 2023-06-29: qty 20

## 2023-06-29 MED ORDER — PROPOFOL 500 MG/50ML IV EMUL
INTRAVENOUS | Status: AC
  Filled 2023-06-29: qty 50

## 2023-06-29 MED ORDER — SODIUM CHLORIDE 0.9 % IR SOLN
Status: DC | PRN
  Administered 2023-06-29: 3000 mL

## 2023-06-29 MED ORDER — ROCURONIUM BROMIDE 10 MG/ML (PF) SYRINGE
PREFILLED_SYRINGE | INTRAVENOUS | Status: AC
  Filled 2023-06-29: qty 10

## 2023-06-29 MED ORDER — INDOCYANINE GREEN 25 MG IV SOLR
2.5000 mg | Freq: Once | INTRAVENOUS | Status: AC
  Administered 2023-06-29: 2.5 mg via INTRAVENOUS

## 2023-06-29 MED ORDER — OXYCODONE HCL 5 MG/5ML PO SOLN: 5.0000 mg | Freq: Once | ORAL | Status: AC | PRN

## 2023-06-29 MED ORDER — SODIUM CHLORIDE 0.9 % IV SOLN
2.0000 g | INTRAVENOUS | Status: AC
  Administered 2023-06-29: 2 g via INTRAVENOUS
  Filled 2023-06-29: qty 2

## 2023-06-29 MED ORDER — SCOPOLAMINE 1 MG/3DAYS TD PT72
MEDICATED_PATCH | TRANSDERMAL | Status: DC
Start: 2023-06-29 — End: 2023-06-29
  Filled 2023-06-29: qty 1

## 2023-06-29 MED ORDER — FENTANYL CITRATE (PF) 100 MCG/2ML IJ SOLN
INTRAMUSCULAR | Status: DC | PRN
  Administered 2023-06-29 (×2): 50 ug via INTRAVENOUS

## 2023-06-29 MED ORDER — ONDANSETRON HCL 4 MG/2ML IJ SOLN
INTRAMUSCULAR | Status: DC | PRN
Start: 2023-06-29 — End: 2023-06-29
  Administered 2023-06-29: 4 mg via INTRAVENOUS

## 2023-06-29 SURGICAL SUPPLY — 41 items
BLADE SURG 15 STRL LF DISP TIS (BLADE) ×2 IMPLANT
CAUTERY HOOK MNPLR 1.6 DVNC XI (INSTRUMENTS) ×2 IMPLANT
CHLORAPREP W/TINT 26 (MISCELLANEOUS) ×2 IMPLANT
CLIP LIGATING HEM O LOK PURPLE (MISCELLANEOUS) ×2 IMPLANT
COVER LIGHT HANDLE STERIS (MISCELLANEOUS) ×4 IMPLANT
DERMABOND ADVANCED .7 DNX12 (GAUZE/BANDAGES/DRESSINGS) ×2 IMPLANT
DRAPE ARM DVNC X/XI (DISPOSABLE) ×8 IMPLANT
DRAPE COLUMN DVNC XI (DISPOSABLE) ×2 IMPLANT
ELECTRODE REM PT RTRN 9FT ADLT (ELECTROSURGICAL) ×2 IMPLANT
FORCEPS BPLR R/ABLATION 8 DVNC (INSTRUMENTS) ×2 IMPLANT
FORCEPS PROGRASP DVNC XI (FORCEP) ×2 IMPLANT
GLOVE BIO SURGEON STRL SZ 6.5 (GLOVE) ×4 IMPLANT
GLOVE BIOGEL PI IND STRL 6.5 (GLOVE) ×4 IMPLANT
GLOVE BIOGEL PI IND STRL 7.0 (GLOVE) ×4 IMPLANT
GOWN STRL REUS W/TWL LRG LVL3 (GOWN DISPOSABLE) ×6 IMPLANT
GRASPER SUT TROCAR 14GX15 (MISCELLANEOUS) ×2 IMPLANT
HEMOSTAT SNOW SURGICEL 2X4 (HEMOSTASIS) IMPLANT
IRRIGATOR SUCT 8 DISP DVNC XI (IRRIGATION / IRRIGATOR) IMPLANT
KIT TURNOVER KIT A (KITS) ×2 IMPLANT
MANIFOLD NEPTUNE II (INSTRUMENTS) IMPLANT
NDL HYPO 21X1.5 SAFETY (NEEDLE) ×2 IMPLANT
NDL INSUFFLATION 14GA 120MM (NEEDLE) ×2 IMPLANT
NEEDLE HYPO 21X1.5 SAFETY (NEEDLE) ×2 IMPLANT
NEEDLE INSUFFLATION 14GA 120MM (NEEDLE) ×2 IMPLANT
OBTURATOR OPTICALSTD 8 DVNC (TROCAR) ×2 IMPLANT
PACK LAP CHOLE LZT030E (CUSTOM PROCEDURE TRAY) ×2 IMPLANT
PAD ARMBOARD POSITIONER FOAM (MISCELLANEOUS) ×2 IMPLANT
PENCIL HANDSWITCHING (ELECTRODE) ×2 IMPLANT
POSITIONER HEAD 8X9X4 ADT (SOFTGOODS) ×2 IMPLANT
RELOAD STAPLE 30 2.5 WHT DVNC (STAPLE) IMPLANT
SEAL UNIV 5-12 XI (MISCELLANEOUS) ×8 IMPLANT
SET BASIN LINEN APH (SET/KITS/TRAYS/PACK) ×2 IMPLANT
SET TUBE SMOKE EVAC HIGH FLOW (TUBING) ×2 IMPLANT
SOL .9 NS 3000ML IRR UROMATIC (IV SOLUTION) ×2 IMPLANT
STAPLER 30 CRVD 8 SUREFORM (STAPLE) IMPLANT
STAPLER RELOAD 30 WHITE 8 (STAPLE) ×2 IMPLANT
SUT MNCRL AB 4-0 PS2 18 (SUTURE) ×4 IMPLANT
SUT VICRYL 0 UR6 27IN ABS (SUTURE) ×2 IMPLANT
SYR 30ML LL (SYRINGE) ×2 IMPLANT
SYSTEM RETRIEVL 5MM INZII UNIV (BASKET) ×2 IMPLANT
WATER STERILE IRR 500ML POUR (IV SOLUTION) ×2 IMPLANT

## 2023-06-29 NOTE — Anesthesia Procedure Notes (Signed)
 Procedure Name: Intubation Date/Time: 06/29/2023 8:15 AM  Performed by: Coretha Dew, MDPre-anesthesia Checklist: Patient identified, Emergency Drugs available, Suction available, Patient being monitored and Timeout performed Patient Re-evaluated:Patient Re-evaluated prior to induction Oxygen Delivery Method: Circle system utilized Preoxygenation: Pre-oxygenation with 100% oxygen Induction Type: IV induction Ventilation: Two handed mask ventilation required and Oral airway inserted - appropriate to patient size Laryngoscope Size: Glidescope and 4 Grade View: Grade I Tube type: Oral Tube size: 7.5 mm Number of attempts: 2 (attempted DL by SRNA, switched to glidescope) Airway Equipment and Method: Stylet and Video-laryngoscopy Placement Confirmation: ETT inserted through vocal cords under direct vision, positive ETCO2 and CO2 detector Secured at: 24 cm Tube secured with: Tape Dental Injury: Teeth and Oropharynx as per pre-operative assessment

## 2023-06-29 NOTE — Op Note (Signed)
 [facility] Operative Note  06/29/23  Preoperative Diagnosis: Symptomatic Cholelithiasis, fatty liver    Postoperative Diagnosis: Same   Procedure(s) Performed: Robotic Assisted Laparoscopic Cholecystectomy   Surgeon: Heidi Llamas C. Collene Dawson, MD   Assistants: No qualified resident was available    Anesthesia: General endotracheal   Anesthesiologist: Coretha Dew, MD    Specimens: Gallbladder   Estimated Blood Loss: Minimal   Blood Replacement: None    Complications: None   Wound Class: Contaminated   Operative Indications: The patient was found to have stones and fatty liver on imaging and was symptomatic.  We discussed the risk of the procedure including but not limited to bleeding, infection, injury to the common bile duct, bile leak, need for further procedures, chance of subtotal cholecystectomy.   Findings:  Nodular liver noted Thin walled gallbladder with stones  Critical view of safety noted Staple line and clips intact at the end of the case Adequate hemostasis    Procedure: Firefly was given in the preoperative area. The patient was taken to the operating room and placed supine. General endotracheal anesthesia was induced. Intravenous antibiotics were  administered per protocol.  An orogastric tube positioned to decompress the stomach. The abdomen was prepared and draped in the usual sterile fashion.   Veress needle was placed at the supraumbilical area and insufflation was started after confirming a positive saline drop test and no immediate increase in abdominal pressure.  After reaching 15 mm, the Veress needle was removed and a 8 mm port was placed via optiview technique supraumbilical, measuring 20 mm away from the suspected position of the gallbladder.  The abdomen was inspected and no abnormalities or injuries were found.  Under direct vision, ports were placed in the following locations in a semi curvilinear position around the target of the  gallbladder: Two 8 mm ports on the patient's right each having 8cm clearance to the adjacent ports and one 8 mm port placed on the patient's left 8 cm from the umbilical port. Once ports were placed, the table was placed in the reverse Trendelenburg position with the right side up. The robotic platform was brought into the operative field and docked to the ports successfully.  An endoscope was placed through the umbilical port, prograsper through the most lateral right port, forced bipolar to the port just right of the umbilicus, and then a hook cautery in the left port.  The liver was noted to be nodular (see photo). The gallbladder was very thin walled and the liver was very enlarged and heavy.   The dome of the gallbladder was grasped with prograsp and retracted but I could not get it over the dome of the liver. I took down the lateral and dome from the liver to help with retraction over the liver.  Adhesions between the gallbladder and omentum, duodenum and transverse colon were lysed via hook cautery. The infundibulum was grasped with the forced bipolar and retracted toward the right lower quadrant. The infundibulum was so thin walled that the anterior part of the gallbladder tore and bile was spilled. This was suctioned and irrigated. I then carefully regrasped and this maneuver exposed Calot's triangle. Firefly was used on sensitive view after the suction and irrigation to help ensure safe visualization of the cystic duct. The peritoneum overlying the gallbladder infundibulum was then dissected and the cystic duct and cystic artery identified.  Critical view of safety with the liver bed clearly visible behind the duct and artery with no additional structures noted.  Given the thin wall of the gallbladder and a tapering cystic duct that was large, I opted to use a the 8 mm stapler on the cystic duct. The staple line was inferior to the tear on the gallbladder and closed the cystic duct without any signs of  bile leakage. The cystic artery was doubly clipped and divided close to the gallbladder.    The gallbladder was then dissected from its peritoneal and liver bed attachments by electrocautery. I then took a wedge liver biopsy with the hook cautery. Hemostasis was checked prior to removing the hook cautery.  A 5mm Endo Catch bag was then placed through the left side port. The liver wedge biopsy and gallbladder were placed into the bag. Additional suction and irrigation was performed. There was no evidence of bleeding from the gallbladder fossa or cystic artery or leakage of the bile from the cystic duct stump. Surgical Tari Fare was placed into the hepatic fossa and the wedge biopsy site. The robot was undocked and moved out of the field,  and the gallbladder and liver biopsy were removed in the bag.  The left port site closed with a 0 vicryl and PMI due to dilation from removing the gallbladder.The abdomen was desufflated and secondary trocars were removed under direct vision.   No bleeding was noted. Marcaine  was injected. All skin incisions were closed with subcuticular sutures of 4-0 monocryl and dermabond.   During the dissection , I had Dr. Delaney Fearing look at the cystic duct infundibulum junction and the area that tore, and she agreed the 8 mm stapler was the best option for the large tapering cystic duct for closure.   Final inspection revealed acceptable hemostasis. All counts were correct at the end of the case. The patient was awakened from anesthesia and extubated without complication. The OG tube was removed.  The patient went to the PACU in stable condition.   Deena Farrier, MD [facility] [address] [place], [place] [address] [phone] (office)

## 2023-06-29 NOTE — Progress Notes (Signed)
 [facility]  Updated his wife. Phone call 6/17. Can restart Eliquis  6/6 unless lots of bruising at the port sites, then hold until 6/8 and notify the office.   Updated Dr. Mordechai April about the nodular liver. Pathology should be back in 4-5 days.   Rx to [facility]. Do not mix tramadol  and roxicodone .  Deena Farrier, MD [facility] [address] [place], [place] [address] [phone] (office)

## 2023-06-29 NOTE — Anesthesia Preprocedure Evaluation (Addendum)
 Anesthesia Evaluation  Patient identified by MRN, date of birth, ID band Patient awake    Reviewed: Allergy & Precautions, H&P , NPO status , Patient's Chart, lab work & pertinent test results, reviewed documented beta blocker date and time   Airway Mallampati: II  TM Distance: >3 FB Neck ROM: full    Dental no notable dental hx.    Pulmonary neg pulmonary ROS, shortness of breath, sleep apnea    Pulmonary exam normal breath sounds clear to auscultation       Cardiovascular Exercise Tolerance: Good hypertension, + CAD, + Past MI and +CHF  + dysrhythmias Atrial Fibrillation + pacemaker  Rhythm:regular Rate:Normal     Neuro/Psych  Neuromuscular disease negative neurological ROS  negative psych ROS   GI/Hepatic negative GI ROS, Neg liver ROS,GERD  ,,  Endo/Other  diabetes  Class 4 obesity  Renal/GU Renal diseasenegative Renal ROS  negative genitourinary   Musculoskeletal   Abdominal   Peds  Hematology negative hematology ROS (+) Blood dyscrasia, anemia   Anesthesia Other Findings   Reproductive/Obstetrics negative OB ROS                             Anesthesia Physical Anesthesia Plan  ASA: 3  Anesthesia Plan: General and General ETT   Post-op Pain Management:    Induction:   PONV Risk Score and Plan: Ondansetron  and Scopolamine patch - Pre-op  Airway Management Planned:   Additional Equipment:   Intra-op Plan:   Post-operative Plan:   Informed Consent: I have reviewed the patients History and Physical, chart, labs and discussed the procedure including the risks, benefits and alternatives for the proposed anesthesia with the patient or authorized representative who has indicated his/her understanding and acceptance.     Dental Advisory Given  Plan Discussed with: CRNA  Anesthesia Plan Comments:        Anesthesia Quick Evaluation

## 2023-06-29 NOTE — Progress Notes (Addendum)
 [facility]  Patient had some chest pain that improved with repositioning in the chair. EKG with no ST elevation. Getting some IV tylenol  one dose to see if that helps pain too. Unable to give toradol.   Dr. Margrette Shield aware of chest pain and EKG too. Seems like pain is more related to his pneumoperitoneal gas and diaphragm irritation.   Deena Farrier, MD [facility] [address] [place], [place] [address] [phone] (office)

## 2023-06-29 NOTE — Transfer of Care (Signed)
 Immediate Anesthesia Transfer of Care Note  Patient: Lawrence Jordan  Procedure(s) Performed: CHOLECYSTECTOMY, ROBOT-ASSISTED, LAPAROSCOPIC (Abdomen) BIOPSY, LIVER (Liver)  Patient Location: PACU  Anesthesia Type:General  Level of Consciousness: awake and drowsy  Airway & Oxygen Therapy: Spontaneously breathing, connected to simple mask oxygen   Post-op Assessment: Report given to RN and Post -op Vital signs reviewed and stable  Post vital signs: Reviewed and stable  Last Vitals:  Vitals Value Taken Time  BP 116/60 06/29/23 1020  Temp    Pulse 65 06/29/23 1026  Resp 21 06/29/23 1026  SpO2 96 % 06/29/23 1026  Vitals shown include unfiled device data.  Last Pain:  Vitals:   06/29/23 0700  PainSc: 0-No pain         Complications: No notable events documented.

## 2023-06-29 NOTE — Discharge Instructions (Signed)
 Discharge Robotic Assisted Laparoscopic Surgery Instructions:  You can restart your Eliquis  on 07/01/2023. If you have a lot of bruising at the incision sites, you can hold the Eliquis  until 07/03/23 and call the office and let us  know on 07/01/2023.   Common Complaints: Right shoulder pain is common after laparoscopic surgery.  This is secondary to the gas used in the surgery being trapped under the diaphragm.  Walk to help your body absorb the gas. This will improve in a few days. Pain at the port sites are common, especially the larger port sites. This will improve with time.  Some nausea is common and poor appetite. The main goal is to stay hydrated the first few days after surgery.   Diet/ Activity: Diet as tolerated. You may not have an appetite, but it is important to stay hydrated.  Drink 64 ounces of water  a day. Your appetite will return with time.  Shower per your regular routine daily.  Do not take hot showers. Take warm showers that are less than 10 minutes. Rest and listen to your body, but do not remain in bed all day.  Walk everyday for at least 15-20 minutes. Deep cough and move around every 1-2 hours in the first few days after surgery.  Do not lift > 10 lbs, perform excessive bending, pushing, pulling, squatting for 1-2 weeks after surgery.  Do not pick at the dermabond glue on your incision sites.  This glue film will remain in place for 1-2 weeks and will start to peel off.  Do not place lotions or balms on your incision unless instructed to specifically by Dr. Collene Dawson.   Pain Expectations and Narcotics: -After surgery you will have pain associated with your incisions and this is normal. The pain is muscular and nerve pain, and will get better with time. -You are encouraged and expected to take non narcotic medications like tylenol  and ibuprofen  (when able) to treat pain as multiple modalities can aid with pain treatment. -Narcotics are only used when pain is severe or there is  breakthrough pain. -You are not expected to have a pain score of 0 after surgery, as we cannot prevent pain. A pain score of 3-4 that allows you to be functional, move, walk, and tolerate some activity is the goal. The pain will continue to improve over the days after surgery and is dependent on your surgery. -Due to [place] law, we are only able to give a certain amount of pain medication to treat post operative pain, and we only give additional narcotics on a patient by patient basis.  -For most laparoscopic surgery, studies have shown that the majority of patients only need 10-15 narcotic pills, and for open surgeries most patients only need 15-20.   -Having appropriate expectations of pain and knowledge of pain management with non narcotics is important as we do not want anyone to become addicted to narcotic pain medication.  -Using ice packs in the first 48 hours and heating pads after 48 hours, wearing an abdominal binder (when recommended), and using over the counter medications are all ways to help with pain management.   -Simple acts like meditation and mindfulness practices after surgery can also help with pain control and research has proven the benefit of these practices.  Medication: Tylenol  for pain and Roxicodone  for breakthrough pain. Do not mix your Tramadol  and Roxicodone .  Take Roxicodone  for breakthrough pain every 4 hours.  Take Colace for constipation related to narcotic pain medication. If you do  not have a bowel movement in 2 days, take Miralax over the counter.  Drink plenty of water  to also prevent constipation.   Contact Information: If you have questions or concerns, please call our office, [phone], Monday- Thursday 8AM-5PM and Friday 8AM-12Noon.  If it is after hours or on the weekend, please call [facility], [phone], [phone], and ask to speak to the surgeon on call for Dr. Collene Dawson at [facility].

## 2023-06-29 NOTE — Interval H&P Note (Signed)
 History and Physical Interval Note:  06/29/2023 7:25 AM  Lawrence Jordan  has presented today for surgery, with the diagnosis of CHOLELITHIASIS; ELEVATED LFT'S; FATTY LIVER.  The various methods of treatment have been discussed with the patient and family. After consideration of risks, benefits and other options for treatment, the patient has consented to  Procedure(s): CHOLECYSTECTOMY, ROBOT-ASSISTED, LAPAROSCOPIC (N/A) BIOPSY, LIVER (N/A) as a surgical intervention.  The patient's history has been reviewed, patient examined, no change in status, stable for surgery.  I have reviewed the patient's chart and labs.  Questions were answered to the patient's satisfaction.   Acceptable risk for cardiology. Blood thinner held.   Awilda Bogus

## 2023-06-30 ENCOUNTER — Encounter (HOSPITAL_COMMUNITY): Payer: Self-pay | Admitting: General Surgery

## 2023-06-30 ENCOUNTER — Ambulatory Visit (INDEPENDENT_AMBULATORY_CARE_PROVIDER_SITE_OTHER): Admitting: General Surgery

## 2023-06-30 DIAGNOSIS — K760 Fatty (change of) liver, not elsewhere classified: Secondary | ICD-10-CM

## 2023-06-30 DIAGNOSIS — K8020 Calculus of gallbladder without cholecystitis without obstruction: Secondary | ICD-10-CM

## 2023-06-30 LAB — SURGICAL PATHOLOGY

## 2023-06-30 NOTE — Progress Notes (Signed)
 Chronic cholecystitis and steatohepatitis, fibrosis and fibrous, grade II of III. I have notified the the patient and Dr. Mordechai April.

## 2023-07-01 NOTE — Anesthesia Postprocedure Evaluation (Signed)
 Anesthesia Post Note  Patient: Lawrence Jordan  Procedure(s) Performed: CHOLECYSTECTOMY, ROBOT-ASSISTED, LAPAROSCOPIC (Abdomen) BIOPSY, LIVER (Liver)  Patient location during evaluation: Phase II Anesthesia Type: General Level of consciousness: awake Pain management: pain level controlled Vital Signs Assessment: post-procedure vital signs reviewed and stable Respiratory status: spontaneous breathing and respiratory function stable Cardiovascular status: blood pressure returned to baseline and stable Postop Assessment: no headache and no apparent nausea or vomiting Anesthetic complications: no Comments: Late entry   No notable events documented.   Last Vitals:  Vitals:   06/29/23 1200 06/29/23 1233  BP: 116/62   Pulse: 65   Resp: 18   Temp:  36.4 C  SpO2: 95%     Last Pain:  Vitals:   06/29/23 1233  TempSrc: Oral  PainSc:                  Coretha Dew

## 2023-07-04 LAB — POCT I-STAT, CHEM 8
BUN: 10 mg/dL (ref 8–23)
Calcium, Ion: 1.15 mmol/L (ref 1.15–1.40)
Chloride: 100 mmol/L (ref 98–111)
Creatinine, Ser: 0.7 mg/dL (ref 0.61–1.24)
Glucose, Bld: 203 mg/dL — ABNORMAL HIGH (ref 70–99)
HCT: 40 % (ref 39.0–52.0)
Hemoglobin: 13.6 g/dL (ref 13.0–17.0)
Potassium: 3.6 mmol/L (ref 3.5–5.1)
Sodium: 138 mmol/L (ref 135–145)
TCO2: 22 mmol/L (ref 22–32)

## 2023-07-05 DIAGNOSIS — L0390 Cellulitis, unspecified: Secondary | ICD-10-CM | POA: Diagnosis not present

## 2023-07-05 DIAGNOSIS — E1169 Type 2 diabetes mellitus with other specified complication: Secondary | ICD-10-CM | POA: Diagnosis not present

## 2023-07-05 DIAGNOSIS — I10 Essential (primary) hypertension: Secondary | ICD-10-CM | POA: Diagnosis not present

## 2023-07-05 DIAGNOSIS — E7800 Pure hypercholesterolemia, unspecified: Secondary | ICD-10-CM | POA: Diagnosis not present

## 2023-07-05 DIAGNOSIS — Z6831 Body mass index (BMI) 31.0-31.9, adult: Secondary | ICD-10-CM | POA: Diagnosis not present

## 2023-07-06 ENCOUNTER — Ambulatory Visit (HOSPITAL_COMMUNITY): Payer: Medicare Other | Admitting: Internal Medicine

## 2023-07-12 ENCOUNTER — Ambulatory Visit (INDEPENDENT_AMBULATORY_CARE_PROVIDER_SITE_OTHER): Admitting: General Surgery

## 2023-07-12 DIAGNOSIS — K760 Fatty (change of) liver, not elsewhere classified: Secondary | ICD-10-CM

## 2023-07-12 DIAGNOSIS — K8020 Calculus of gallbladder without cholecystitis without obstruction: Secondary | ICD-10-CM

## 2023-07-12 NOTE — Progress Notes (Signed)
 [facility]  I am calling the patient for post operative evaluation. This is not a billable encounter as it is under the global charges for the surgery.  The patient had a robotic cholecystectomy and liver biopsy on 6/4. The patient reports that he is doing well overall. The are tolerating a diet, having good pain control, and having Bms but is having looser stools with some foods.  The incisions are healing with minimal bruising. The patient has no concerns.   Pathology: A. WEDGE LIVER BIOPSY:  - Moderately active steatohepatitis (grade 2 of 3), See comment  - Centrilobular fibrosis with fibrous septa   B. GALLBLADDER:  - Chronic cholecystitis and cholelithiasis   Will see the patient PRN.   Deena Farrier, MD [facility] [address] [place], [place] [address] [phone] (office)

## 2023-07-13 NOTE — Progress Notes (Signed)
 Remote pacemaker transmission.

## 2023-07-19 DIAGNOSIS — E1169 Type 2 diabetes mellitus with other specified complication: Secondary | ICD-10-CM | POA: Diagnosis not present

## 2023-07-19 DIAGNOSIS — I10 Essential (primary) hypertension: Secondary | ICD-10-CM | POA: Diagnosis not present

## 2023-07-19 DIAGNOSIS — R7989 Other specified abnormal findings of blood chemistry: Secondary | ICD-10-CM | POA: Diagnosis not present

## 2023-07-21 ENCOUNTER — Telehealth: Payer: Self-pay | Admitting: *Deleted

## 2023-07-21 DIAGNOSIS — Z6841 Body Mass Index (BMI) 40.0 and over, adult: Secondary | ICD-10-CM | POA: Diagnosis not present

## 2023-07-21 DIAGNOSIS — I10 Essential (primary) hypertension: Secondary | ICD-10-CM | POA: Diagnosis not present

## 2023-07-21 DIAGNOSIS — E7800 Pure hypercholesterolemia, unspecified: Secondary | ICD-10-CM | POA: Diagnosis not present

## 2023-07-21 DIAGNOSIS — E1169 Type 2 diabetes mellitus with other specified complication: Secondary | ICD-10-CM | POA: Diagnosis not present

## 2023-07-21 DIAGNOSIS — I482 Chronic atrial fibrillation, unspecified: Secondary | ICD-10-CM | POA: Diagnosis not present

## 2023-07-21 NOTE — Telephone Encounter (Signed)
 Surgical Date: 06/29/2023 Procedure: XI ROBOTIC ASSISTED LAPAROSCOPIC CHOLECYSTECTOMY   Received call from patient [phone]~ telephone.   Patient reports abdominal pain that has worsened in the past week. States that he was reaching across abdomen and felt like he may have pulled a muscle.   Reports that he was seen by PCP today and mentioned pain. PCP recommended further imaging with CT. Patient states that he would prefer to have Dr. Kallie evaluate.  Advised that pulled muscle should be slowly improving with rest and conservative care. Advised if pain worsens, to return call for further recommendations.   Appointment scheduled.

## 2023-07-21 NOTE — Telephone Encounter (Signed)
 Appointment scheduled for 07/27/2023 at 10:30 AM.

## 2023-07-22 ENCOUNTER — Encounter: Payer: Self-pay | Admitting: Cardiovascular Disease

## 2023-07-22 ENCOUNTER — Other Ambulatory Visit: Payer: Self-pay

## 2023-07-22 ENCOUNTER — Other Ambulatory Visit (HOSPITAL_COMMUNITY): Payer: Self-pay | Admitting: Internal Medicine

## 2023-07-22 MED ORDER — MAGNESIUM OXIDE 400 MG PO TABS: 1.0000 | ORAL_TABLET | Freq: Two times a day (BID) | ORAL | 3 refills | Status: DC

## 2023-07-25 DIAGNOSIS — I10 Essential (primary) hypertension: Secondary | ICD-10-CM | POA: Diagnosis not present

## 2023-07-25 DIAGNOSIS — E1169 Type 2 diabetes mellitus with other specified complication: Secondary | ICD-10-CM | POA: Diagnosis not present

## 2023-07-26 MED ORDER — MAGNESIUM OXIDE 400 MG PO TABS: 1.0000 | ORAL_TABLET | Freq: Two times a day (BID) | ORAL | 3 refills | Status: AC

## 2023-07-27 ENCOUNTER — Encounter: Payer: Self-pay | Admitting: General Surgery

## 2023-07-27 ENCOUNTER — Ambulatory Visit (INDEPENDENT_AMBULATORY_CARE_PROVIDER_SITE_OTHER): Admitting: General Surgery

## 2023-07-27 VITALS — BP 159/88 | HR 69 | Temp 98.1°F | Resp 18 | Ht 71.5 in | Wt 336.0 lb

## 2023-07-27 DIAGNOSIS — K760 Fatty (change of) liver, not elsewhere classified: Secondary | ICD-10-CM

## 2023-07-27 DIAGNOSIS — K8020 Calculus of gallbladder without cholecystitis without obstruction: Secondary | ICD-10-CM

## 2023-07-27 NOTE — Patient Instructions (Addendum)
 Based on the location of the pain, I do not think this is related to the gallbladder surgery. Likely this is a pulled muscle.  Try lidocaine  patches 4% over the counter in the area.  NSAIDs like ibuprofen  can also help. A heating pad could help too.  It could be irritation of the rib in the area too, more like costochondritis. I would see if these measures help and follow up with your PCP.   I reviewed your MRI and based on the location of the pain, this is far from the area where the gallbladder was removed.

## 2023-07-27 NOTE — Progress Notes (Signed)
 [facility]  Doing well but having pain in the right flank /over rib cage.  BP (!) 159/88   Pulse 69   Temp 98.1 F (36.7 C) (Oral)   Resp 18   Ht 5' 11.5 (1.816 m)   Wt (!) 336 lb (152.4 kg)   SpO2 91%   BMI 46.21 kg/m  Soft, no signs of bruising, tender at the right flank lateral, over the rib, port sites c/d/I with no erythema or drainage  Patient with right flank pain.   Based on the location of the pain, I do not think this is related to the gallbladder surgery. Likely this is a pulled muscle.  Try lidocaine  patches 4% over the counter in the area.  NSAIDs like ibuprofen  can also help. A heating pad could help too.  It could be irritation of the rib in the area too, more like costochondritis. I would see if these measures help and follow up with your PCP.   I reviewed your MRI and based on the location of the pain, this is far from the area where the gallbladder was removed.   Lawrence Pander, MD [facility] [address] [place], [place] [address] [phone] (office)

## 2023-07-31 ENCOUNTER — Other Ambulatory Visit: Payer: Self-pay | Admitting: Gastroenterology

## 2023-07-31 DIAGNOSIS — K2970 Gastritis, unspecified, without bleeding: Secondary | ICD-10-CM

## 2023-07-31 DIAGNOSIS — K219 Gastro-esophageal reflux disease without esophagitis: Secondary | ICD-10-CM

## 2023-08-03 DIAGNOSIS — M7502 Adhesive capsulitis of left shoulder: Secondary | ICD-10-CM | POA: Diagnosis not present

## 2023-08-03 DIAGNOSIS — I482 Chronic atrial fibrillation, unspecified: Secondary | ICD-10-CM | POA: Diagnosis not present

## 2023-08-03 DIAGNOSIS — Z6841 Body Mass Index (BMI) 40.0 and over, adult: Secondary | ICD-10-CM | POA: Diagnosis not present

## 2023-08-03 DIAGNOSIS — M19012 Primary osteoarthritis, left shoulder: Secondary | ICD-10-CM | POA: Diagnosis not present

## 2023-08-03 DIAGNOSIS — M170 Bilateral primary osteoarthritis of knee: Secondary | ICD-10-CM | POA: Diagnosis not present

## 2023-08-04 ENCOUNTER — Encounter: Payer: Self-pay | Admitting: Internal Medicine

## 2023-08-05 ENCOUNTER — Other Ambulatory Visit (HOSPITAL_COMMUNITY): Payer: Self-pay | Admitting: Internal Medicine

## 2023-08-05 DIAGNOSIS — N401 Enlarged prostate with lower urinary tract symptoms: Secondary | ICD-10-CM | POA: Diagnosis not present

## 2023-08-05 DIAGNOSIS — K760 Fatty (change of) liver, not elsewhere classified: Secondary | ICD-10-CM | POA: Diagnosis not present

## 2023-08-05 DIAGNOSIS — Z1329 Encounter for screening for other suspected endocrine disorder: Secondary | ICD-10-CM | POA: Diagnosis not present

## 2023-08-05 DIAGNOSIS — R7401 Elevation of levels of liver transaminase levels: Secondary | ICD-10-CM | POA: Diagnosis not present

## 2023-08-05 DIAGNOSIS — E1165 Type 2 diabetes mellitus with hyperglycemia: Secondary | ICD-10-CM | POA: Diagnosis not present

## 2023-08-19 ENCOUNTER — Other Ambulatory Visit (HOSPITAL_COMMUNITY): Payer: Self-pay | Admitting: Internal Medicine

## 2023-08-19 ENCOUNTER — Ambulatory Visit (HOSPITAL_COMMUNITY)
Admission: RE | Admit: 2023-08-19 | Discharge: 2023-08-19 | Disposition: A | Discharge status: Home / Self Care | Source: Ambulatory Visit | Attending: Internal Medicine | Admitting: Internal Medicine

## 2023-08-19 DIAGNOSIS — T07XXXA Unspecified multiple injuries, initial encounter: Secondary | ICD-10-CM | POA: Diagnosis not present

## 2023-08-19 DIAGNOSIS — Z6841 Body Mass Index (BMI) 40.0 and over, adult: Secondary | ICD-10-CM | POA: Diagnosis not present

## 2023-08-19 DIAGNOSIS — S0083XA Contusion of other part of head, initial encounter: Secondary | ICD-10-CM | POA: Insufficient documentation

## 2023-08-19 DIAGNOSIS — S0003XA Contusion of scalp, initial encounter: Secondary | ICD-10-CM | POA: Insufficient documentation

## 2023-08-19 DIAGNOSIS — S1093XA Contusion of unspecified part of neck, initial encounter: Secondary | ICD-10-CM | POA: Diagnosis not present

## 2023-08-22 DIAGNOSIS — M542 Cervicalgia: Secondary | ICD-10-CM | POA: Diagnosis not present

## 2023-08-22 DIAGNOSIS — H02125 Mechanical ectropion of left lower eyelid: Secondary | ICD-10-CM | POA: Diagnosis not present

## 2023-08-22 DIAGNOSIS — M25569 Pain in unspecified knee: Secondary | ICD-10-CM | POA: Diagnosis not present

## 2023-08-22 DIAGNOSIS — M6281 Muscle weakness (generalized): Secondary | ICD-10-CM | POA: Diagnosis not present

## 2023-08-22 DIAGNOSIS — H401213 Low-tension glaucoma, right eye, severe stage: Secondary | ICD-10-CM | POA: Diagnosis not present

## 2023-08-22 DIAGNOSIS — H25813 Combined forms of age-related cataract, bilateral: Secondary | ICD-10-CM | POA: Diagnosis not present

## 2023-08-22 DIAGNOSIS — H04123 Dry eye syndrome of bilateral lacrimal glands: Secondary | ICD-10-CM | POA: Diagnosis not present

## 2023-08-22 DIAGNOSIS — Z9181 History of falling: Secondary | ICD-10-CM | POA: Diagnosis not present

## 2023-08-22 DIAGNOSIS — H401222 Low-tension glaucoma, left eye, moderate stage: Secondary | ICD-10-CM | POA: Diagnosis not present

## 2023-08-22 DIAGNOSIS — M25512 Pain in left shoulder: Secondary | ICD-10-CM | POA: Diagnosis not present

## 2023-08-25 DIAGNOSIS — E1169 Type 2 diabetes mellitus with other specified complication: Secondary | ICD-10-CM | POA: Diagnosis not present

## 2023-08-25 DIAGNOSIS — I10 Essential (primary) hypertension: Secondary | ICD-10-CM | POA: Diagnosis not present

## 2023-08-26 DIAGNOSIS — M170 Bilateral primary osteoarthritis of knee: Secondary | ICD-10-CM | POA: Diagnosis not present

## 2023-08-26 DIAGNOSIS — E1169 Type 2 diabetes mellitus with other specified complication: Secondary | ICD-10-CM | POA: Diagnosis not present

## 2023-08-26 DIAGNOSIS — M7502 Adhesive capsulitis of left shoulder: Secondary | ICD-10-CM | POA: Diagnosis not present

## 2023-08-26 DIAGNOSIS — M19012 Primary osteoarthritis, left shoulder: Secondary | ICD-10-CM | POA: Diagnosis not present

## 2023-08-26 DIAGNOSIS — Z6841 Body Mass Index (BMI) 40.0 and over, adult: Secondary | ICD-10-CM | POA: Diagnosis not present

## 2023-08-29 DIAGNOSIS — M25512 Pain in left shoulder: Secondary | ICD-10-CM | POA: Diagnosis not present

## 2023-08-29 DIAGNOSIS — M6281 Muscle weakness (generalized): Secondary | ICD-10-CM | POA: Diagnosis not present

## 2023-08-29 DIAGNOSIS — M542 Cervicalgia: Secondary | ICD-10-CM | POA: Diagnosis not present

## 2023-08-29 DIAGNOSIS — M25569 Pain in unspecified knee: Secondary | ICD-10-CM | POA: Diagnosis not present

## 2023-08-30 ENCOUNTER — Ambulatory Visit (INDEPENDENT_AMBULATORY_CARE_PROVIDER_SITE_OTHER): Payer: Medicare Other

## 2023-08-30 DIAGNOSIS — I495 Sick sinus syndrome: Secondary | ICD-10-CM

## 2023-08-31 DIAGNOSIS — M25569 Pain in unspecified knee: Secondary | ICD-10-CM | POA: Diagnosis not present

## 2023-08-31 DIAGNOSIS — M542 Cervicalgia: Secondary | ICD-10-CM | POA: Diagnosis not present

## 2023-08-31 DIAGNOSIS — M25512 Pain in left shoulder: Secondary | ICD-10-CM | POA: Diagnosis not present

## 2023-08-31 DIAGNOSIS — M6281 Muscle weakness (generalized): Secondary | ICD-10-CM | POA: Diagnosis not present

## 2023-08-31 LAB — CUP PACEART REMOTE DEVICE CHECK
Battery Remaining Longevity: 78 mo
Battery Remaining Percentage: 75 %
Battery Voltage: 3.01 V
Brady Statistic AP VP Percent: 1 %
Brady Statistic AP VS Percent: 94 %
Brady Statistic AS VP Percent: 1 %
Brady Statistic AS VS Percent: 5.1 %
Brady Statistic RA Percent Paced: 81 %
Brady Statistic RV Percent Paced: 1.4 %
Date Time Interrogation Session: 20250805032332
Implantable Lead Connection Status: 753985
Implantable Lead Connection Status: 753985
Implantable Lead Implant Date: 20221108
Implantable Lead Implant Date: 20221108
Implantable Lead Location: 753859
Implantable Lead Location: 753860
Implantable Pulse Generator Implant Date: 20221108
Lead Channel Impedance Value: 450 Ohm
Lead Channel Impedance Value: 460 Ohm
Lead Channel Pacing Threshold Amplitude: 0.5 V
Lead Channel Pacing Threshold Amplitude: 0.75 V
Lead Channel Pacing Threshold Pulse Width: 0.5 ms
Lead Channel Pacing Threshold Pulse Width: 0.5 ms
Lead Channel Sensing Intrinsic Amplitude: 12 mV
Lead Channel Sensing Intrinsic Amplitude: 2.8 mV
Lead Channel Setting Pacing Amplitude: 2 V
Lead Channel Setting Pacing Amplitude: 2.5 V
Lead Channel Setting Pacing Pulse Width: 0.5 ms
Lead Channel Setting Sensing Sensitivity: 2 mV
Pulse Gen Model: 2272
Pulse Gen Serial Number: 3968031

## 2023-09-01 ENCOUNTER — Ambulatory Visit (INDEPENDENT_AMBULATORY_CARE_PROVIDER_SITE_OTHER): Admitting: Endocrinology

## 2023-09-01 ENCOUNTER — Encounter: Payer: Self-pay | Admitting: Endocrinology

## 2023-09-01 VITALS — BP 158/82 | HR 65 | Resp 20 | Ht 71.5 in | Wt 341.6 lb

## 2023-09-01 DIAGNOSIS — Z794 Long term (current) use of insulin: Secondary | ICD-10-CM | POA: Diagnosis not present

## 2023-09-01 DIAGNOSIS — E1165 Type 2 diabetes mellitus with hyperglycemia: Secondary | ICD-10-CM | POA: Diagnosis not present

## 2023-09-01 MED ORDER — DAPAGLIFLOZIN PROPANEDIOL 5 MG PO TABS: 5.0000 mg | ORAL_TABLET | Freq: Every day | ORAL | 3 refills | Status: DC

## 2023-09-01 MED ORDER — TRESIBA FLEXTOUCH 200 UNIT/ML ~~LOC~~ SOPN
84.0000 [IU] | PEN_INJECTOR | Freq: Every day | SUBCUTANEOUS | 4 refills | Status: DC
Start: 2023-09-01 — End: 2023-12-07
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
## 2023-09-01 NOTE — Patient Instructions (Addendum)
  Continue Ozempic  2 mg weekly. Continue metformin  1000 mg two times a day.  Increase Tresiba  84 units daily.   Trial of Farxiga  5mg  daily. Stop immediately if yeast infection happens.

## 2023-09-02 ENCOUNTER — Ambulatory Visit: Payer: Self-pay | Admitting: Endocrinology

## 2023-09-02 LAB — MICROALBUMIN / CREATININE URINE RATIO
Creatinine, Urine: 80 mg/dL (ref 20–320)
Microalb Creat Ratio: 79 mg/g{creat} — ABNORMAL HIGH (ref <30)
Microalb, Ur: 6.3 mg/dL

## 2023-09-05 ENCOUNTER — Telehealth: Payer: Self-pay

## 2023-09-05 DIAGNOSIS — M542 Cervicalgia: Secondary | ICD-10-CM | POA: Diagnosis not present

## 2023-09-05 DIAGNOSIS — M6281 Muscle weakness (generalized): Secondary | ICD-10-CM | POA: Diagnosis not present

## 2023-09-05 DIAGNOSIS — M25569 Pain in unspecified knee: Secondary | ICD-10-CM | POA: Diagnosis not present

## 2023-09-05 DIAGNOSIS — M25512 Pain in left shoulder: Secondary | ICD-10-CM | POA: Diagnosis not present

## 2023-09-05 NOTE — Telephone Encounter (Signed)
 Pt returning nurse call

## 2023-09-05 NOTE — Telephone Encounter (Signed)
 Alert remote transmission: Exceed AT/AF AF in progress from 8/10 @ 05:00, poor rate control, Eliquis  per EPIC   LM on patient's VM to assess symptoms, general health and med compliance.  He is also being followed by DOROTHA Terra RIGGERS in the AF Clinic.

## 2023-09-06 ENCOUNTER — Ambulatory Visit: Payer: Self-pay | Admitting: Cardiovascular Disease

## 2023-09-06 NOTE — Telephone Encounter (Signed)
 Patient to increase amiodarone  200mg  twice a day and will send transmission on Thursday to follow up on rhythm.  He has PRN metoprolol  tartrate to use for rate control as BP will allow. Pt overall feels ok just fatigue. Will follow up with patient after transmission for next steps. Pt in agreement.

## 2023-09-06 NOTE — Telephone Encounter (Signed)
 Left message to discuss

## 2023-09-06 NOTE — Telephone Encounter (Signed)
 Patient is asymptomatic and will send a remote in 20 minutes. Will f/u w/ pt after transmission received.

## 2023-09-06 NOTE — Telephone Encounter (Signed)
 Remote transmission received/reviewed. Presenting rhythm ~ AF w/ elevated VR's in 130's. Patient only reports fatigue. Advised patient will forward to AF clinic to advise further. Patient is agreeable to plan and will call back if any changes in symptoms arise.

## 2023-09-08 NOTE — Telephone Encounter (Signed)
 Patient has not transmitted yet today at 4pm check.

## 2023-09-08 NOTE — Telephone Encounter (Signed)
 Per RIcky fenton PA will increase metoprolol  succinate to 50mg  bid and follow up to schedule DCCV. Pt in agreement.

## 2023-09-08 NOTE — Telephone Encounter (Signed)
 Transmission received:  Presenting AF/ irregular R-R v rates in the 160's.  (78-167bpm)  AF burden 98% on 8/14 per graph.  Forwarding update to AF clinic.

## 2023-09-09 ENCOUNTER — Ambulatory Visit (HOSPITAL_COMMUNITY)
Admission: RE | Admit: 2023-09-09 | Discharge: 2023-09-09 | Disposition: A | Discharge status: Home / Self Care | Source: Ambulatory Visit | Attending: Physician Assistant | Admitting: Physician Assistant

## 2023-09-09 VITALS — BP 138/80 | HR 106 | Ht 71.5 in | Wt 341.4 lb

## 2023-09-09 DIAGNOSIS — Z5181 Encounter for therapeutic drug level monitoring: Secondary | ICD-10-CM | POA: Diagnosis not present

## 2023-09-09 DIAGNOSIS — I484 Atypical atrial flutter: Secondary | ICD-10-CM | POA: Diagnosis not present

## 2023-09-09 DIAGNOSIS — I4891 Unspecified atrial fibrillation: Secondary | ICD-10-CM | POA: Diagnosis not present

## 2023-09-09 DIAGNOSIS — D6869 Other thrombophilia: Secondary | ICD-10-CM | POA: Diagnosis not present

## 2023-09-09 DIAGNOSIS — Z79899 Other long term (current) drug therapy: Secondary | ICD-10-CM | POA: Diagnosis not present

## 2023-09-09 DIAGNOSIS — I4819 Other persistent atrial fibrillation: Secondary | ICD-10-CM | POA: Insufficient documentation

## 2023-09-09 MED ORDER — METOPROLOL SUCCINATE ER 50 MG PO TB24: 50.0000 mg | ORAL_TABLET | Freq: Two times a day (BID) | ORAL | 1 refills | Status: DC
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
## 2023-09-09 NOTE — Patient Instructions (Signed)
 Continue metoprolol  50mg  twice a day   Day of cardioversion reduce amiodarone  to 200mg  once  Cardioversion scheduled for: Friday, August 22nd   - Arrive at the [facility] ([address])  and check in with ADMITTING at 8:30 AM   - Do not eat or drink anything after midnight the night prior to your procedure.   - Take all your morning medication (except diabetic medications) with a sip of water  prior to arrival.  - Do NOT miss any doses of your blood thinner - if you should miss a dose or take a dose more than 4 hours late -- please notify our office immediately.  - You will not be able to drive home after your procedure. Please ensure you have a responsible adult to drive you home. You will need someone with you for 24 hours post procedure.     - Expect to be in the procedural area approximately 2 hours.   - If you feel as if you go back into normal rhythm prior to scheduled cardioversion, please notify our office immediately.   If your procedure is canceled in the cardioversion suite you will be charged a cancellation fee.    Hold below medications 7 days prior to scheduled procedure/anesthesia.  Restart medication on the normal dosing day after scheduled procedure/anesthesia  Semaglutide  (Ozempic ) - Hold August 18th     Hold below medications 72 hours prior to scheduled procedure/anesthesia. Restart medication on the following day after scheduled procedure/anesthesia  Dapagliflozin  (Farxiga ) - hold as of August 19th  **Patients on the above medications scheduled for elective procedures that have not held the medication for the appropriate amount of time are at risk of cancellation or change in the anesthetic plan.

## 2023-09-10 LAB — CBC
Hematocrit: 44.9 % (ref 37.5–51.0)
Hemoglobin: 15.1 g/dL (ref 13.0–17.7)
MCH: 32.3 pg (ref 26.6–33.0)
MCHC: 33.6 g/dL (ref 31.5–35.7)
MCV: 96 fL (ref 79–97)
Platelets: 218 x10E3/uL (ref 150–450)
RBC: 4.68 x10E6/uL (ref 4.14–5.80)
RDW: 13 % (ref 11.6–15.4)
WBC: 7.1 x10E3/uL (ref 3.4–10.8)

## 2023-09-10 LAB — COMPREHENSIVE METABOLIC PANEL WITH GFR
ALT: 45 IU/L — ABNORMAL HIGH (ref 0–44)
AST: 46 IU/L — ABNORMAL HIGH (ref 0–40)
Albumin: 4.4 g/dL (ref 3.8–4.8)
Alkaline Phosphatase: 62 IU/L (ref 44–121)
BUN/Creatinine Ratio: 15 (ref 10–24)
BUN: 15 mg/dL (ref 8–27)
Bilirubin Total: 0.4 mg/dL (ref 0.0–1.2)
CO2: 21 mmol/L (ref 20–29)
Calcium: 9.9 mg/dL (ref 8.6–10.2)
Chloride: 100 mmol/L (ref 96–106)
Creatinine, Ser: 0.97 mg/dL (ref 0.76–1.27)
Globulin, Total: 2.9 g/dL (ref 1.5–4.5)
Glucose: 130 mg/dL — ABNORMAL HIGH (ref 70–99)
Potassium: 4.6 mmol/L (ref 3.5–5.2)
Sodium: 135 mmol/L (ref 134–144)
Total Protein: 7.3 g/dL (ref 6.0–8.5)
eGFR: 83 mL/min/1.73 (ref >59)

## 2023-09-10 LAB — TSH: TSH: 3.71 u[IU]/mL (ref 0.450–4.500)

## 2023-09-12 ENCOUNTER — Ambulatory Visit (HOSPITAL_COMMUNITY): Payer: Self-pay | Admitting: Physician Assistant

## 2023-09-12 DIAGNOSIS — M25569 Pain in unspecified knee: Secondary | ICD-10-CM | POA: Diagnosis not present

## 2023-09-12 DIAGNOSIS — M542 Cervicalgia: Secondary | ICD-10-CM | POA: Diagnosis not present

## 2023-09-12 DIAGNOSIS — M25512 Pain in left shoulder: Secondary | ICD-10-CM | POA: Diagnosis not present

## 2023-09-12 DIAGNOSIS — M6281 Muscle weakness (generalized): Secondary | ICD-10-CM | POA: Diagnosis not present

## 2023-09-14 ENCOUNTER — Telehealth: Payer: Self-pay

## 2023-09-14 DIAGNOSIS — M25569 Pain in unspecified knee: Secondary | ICD-10-CM | POA: Diagnosis not present

## 2023-09-14 DIAGNOSIS — M542 Cervicalgia: Secondary | ICD-10-CM | POA: Diagnosis not present

## 2023-09-14 DIAGNOSIS — M6281 Muscle weakness (generalized): Secondary | ICD-10-CM | POA: Diagnosis not present

## 2023-09-14 DIAGNOSIS — M25512 Pain in left shoulder: Secondary | ICD-10-CM | POA: Diagnosis not present

## 2023-09-14 NOTE — Telephone Encounter (Signed)
 Transmission received today: Will forward to AF Clinic Patient is currently scheduled for a DCCV on 09/16/23  Presenting rhythm: AP/ VS  4 AMS episodes. 1 HVR episode- AF cannot exclude possible AVNRT.

## 2023-09-14 NOTE — Telephone Encounter (Signed)
 Per Daril Kicks PA is back in NSR. we can cancel DCCV. I'd like for him to stay on amio BID and current dose of BB until he sees Mealor though.   Pt notified and DCCV  canceled.

## 2023-09-14 NOTE — Telephone Encounter (Signed)
 Pt called in and was told to send in a transmission to see if he is in rhythm because he is scheduled for a cardioversion. Pt would like a call back to confirm

## 2023-09-16 ENCOUNTER — Ambulatory Visit (HOSPITAL_COMMUNITY): Admission: RE | Admit: 2023-09-16 | Source: Home / Self Care | Admitting: Internal Medicine

## 2023-09-16 ENCOUNTER — Encounter (HOSPITAL_COMMUNITY): Admission: RE | Payer: Self-pay | Source: Home / Self Care

## 2023-09-16 SURGERY — CARDIOVERSION (CATH LAB)
Anesthesia: General

## 2023-09-19 DIAGNOSIS — M25512 Pain in left shoulder: Secondary | ICD-10-CM | POA: Diagnosis not present

## 2023-09-19 DIAGNOSIS — M542 Cervicalgia: Secondary | ICD-10-CM | POA: Diagnosis not present

## 2023-09-19 DIAGNOSIS — M25569 Pain in unspecified knee: Secondary | ICD-10-CM | POA: Diagnosis not present

## 2023-09-19 DIAGNOSIS — M6281 Muscle weakness (generalized): Secondary | ICD-10-CM | POA: Diagnosis not present

## 2023-09-21 DIAGNOSIS — M25569 Pain in unspecified knee: Secondary | ICD-10-CM | POA: Diagnosis not present

## 2023-09-21 DIAGNOSIS — M25512 Pain in left shoulder: Secondary | ICD-10-CM | POA: Diagnosis not present

## 2023-09-21 DIAGNOSIS — M542 Cervicalgia: Secondary | ICD-10-CM | POA: Diagnosis not present

## 2023-09-21 DIAGNOSIS — M6281 Muscle weakness (generalized): Secondary | ICD-10-CM | POA: Diagnosis not present

## 2023-09-23 DIAGNOSIS — E1169 Type 2 diabetes mellitus with other specified complication: Secondary | ICD-10-CM | POA: Diagnosis not present

## 2023-09-23 DIAGNOSIS — I10 Essential (primary) hypertension: Secondary | ICD-10-CM | POA: Diagnosis not present

## 2023-09-30 ENCOUNTER — Ambulatory Visit: Attending: Cardiovascular Disease | Admitting: Cardiovascular Disease

## 2023-09-30 ENCOUNTER — Encounter: Payer: Self-pay | Admitting: Cardiovascular Disease

## 2023-09-30 VITALS — BP 150/84 | HR 67 | Ht 71.0 in | Wt 343.0 lb

## 2023-09-30 DIAGNOSIS — I4891 Unspecified atrial fibrillation: Secondary | ICD-10-CM | POA: Insufficient documentation

## 2023-09-30 DIAGNOSIS — I10 Essential (primary) hypertension: Secondary | ICD-10-CM | POA: Insufficient documentation

## 2023-09-30 MED ORDER — AMIODARONE HCL 200 MG PO TABS: 200.0000 mg | ORAL_TABLET | Freq: Every day | ORAL | Status: DC

## 2023-09-30 NOTE — Progress Notes (Signed)
    PCP: Trudy Vaughn FALCON, MD   Primary EP:  Dr [name] Milan JINNY Lawrence Jordan is a 73 y.o. male who presents today for routine electrophysiology followup.  Since last being seen in our clinic, the patient reports doing reasonably well.    He was very fatigued due to recurrent anemia due to GI losses. This appears to be under control now. I had referred him for a Watchman, but the procedure was deferred since his GI bleed appeared resolved.  He has had 2 ablations for atrial fibrillation by Dr. Kelsie.  The most recent was in October 2021.  Pulmonary vein isolation was reinforced and a posterior wall ablation performed.  Dr. Kelsie noted that the patient had a complex atypical flutter with variable cycle length that was not mappable.  Pacemaker was placed in 2022 due to issues with tachy-brady and difficult rate control.  He underwent Tikosyn  load in May 2024.  Despite the Tikosyn , he has had a few breakthrough episodes of atrial flutter, sometimes with rapid rates.  He had discontinue metoprolol , so this was resumed. He reports that he is doing well.   He was admitted on December 26, 2022  with severe tachycardia, heart rates in excess of 200 bpm.  He had missed a dose of metoprolol  EGM's most consistent with one-to-one flutter.  The arrhythmia was not responsive to adenosine .  He was eventually DC cardioverted with a single 100 J shock.  He switched from Tikosyn  to amiodarone  but has since had at least 2 recurrences of atrial fibrillation.  The most recent, in August, his amiodarone  was increased to 200 mg twice daily and he subsequent converted back to sinus rhythm.  Today, he presents for routine follow-up.    Physical Exam: Vitals:   09/30/23 1109  BP: (!) 150/84  Pulse: 67  SpO2: 96%  Weight: (!) 343 lb (155.6 kg)  Height: 5' 11 (1.803 m)     Gen: Appears comfortable, well-nourished CV: tachy, regular, + dependent edema. No murmurs, rubs. Basilar rales clear with deep breaths. No  JVD sitting upright Pulm: breathing easily   Pacemaker interrogation- reviewed in detail today,  See PACEART report   EKG Interpretation Date/Time:  Friday September 30 2023 11:15:10 EDT Ventricular Rate:  67 PR Interval:  270 QRS Duration:  98 QT Interval:  438 QTC Calculation: 462 R Axis:   6  Text Interpretation: Atrial-paced rhythm with prolonged AV conduction with Premature atrial complexes When compared with ECG of 09-Sep-2023 15:32, Electronic atrial pacemaker has replaced Electronic ventricular pacemaker Vent. rate has decreased BY  39 BPM Confirmed by [name] Scotts [phone]) on 09/30/2023 11:29:57 AM   Assessment and Plan:  Persistent afib and atrial flutter He had recurrence of atrial flutter despite Tikosyn   Having rare but sustained episodes of atrial arrhythmia on amiodarone   Will decrease amiodarone  back down to 200 mg daily Labs September 09, 2023 reviewed He has had acute CHF in the past due to AF/flutter, Chads2vasc score is 6.   Continue Eliquis  5 mg twice daily  Symptomatic sinus bradycardia  Normal pacemaker function See Pace Art report he is not device dependant today  HTN Stable No change required today  Anemia Secondary to GI bleeding/ iron  deficiency anemia Follows closely with GI (Dr Cindie)  Obesity Body mass index is 47.84 kg/m. Lifestyle modification advised  OSA Compliance with CPAP advised  Chronic diastolic dysfunction Stable No change required today  Follow-up 3 months  Scotts FORBES [name], MD  09/30/2023 11:29 AM

## 2023-09-30 NOTE — Patient Instructions (Addendum)
 Medication Instructions:   Please take your Amiodarone  daily  Continue all other medications.     Labwork:  none  Testing/Procedures:  none  Follow-Up:  6 months - afib clinic  Any Other Special Instructions Will Be Listed Below (If Applicable).   If you need a refill on your cardiac medications before your next appointment, please call your pharmacy.

## 2023-10-10 DIAGNOSIS — H04123 Dry eye syndrome of bilateral lacrimal glands: Secondary | ICD-10-CM | POA: Diagnosis not present

## 2023-10-10 DIAGNOSIS — H401222 Low-tension glaucoma, left eye, moderate stage: Secondary | ICD-10-CM | POA: Diagnosis not present

## 2023-10-10 DIAGNOSIS — H401213 Low-tension glaucoma, right eye, severe stage: Secondary | ICD-10-CM | POA: Diagnosis not present

## 2023-10-10 DIAGNOSIS — H25813 Combined forms of age-related cataract, bilateral: Secondary | ICD-10-CM | POA: Diagnosis not present

## 2023-10-10 DIAGNOSIS — H02125 Mechanical ectropion of left lower eyelid: Secondary | ICD-10-CM | POA: Diagnosis not present

## 2023-10-24 NOTE — Progress Notes (Signed)
 Remote PPM Transmission

## 2023-10-25 ENCOUNTER — Encounter: Payer: Self-pay | Admitting: Podiatry

## 2023-10-25 ENCOUNTER — Ambulatory Visit (INDEPENDENT_AMBULATORY_CARE_PROVIDER_SITE_OTHER): Admitting: Podiatry

## 2023-10-25 DIAGNOSIS — E1165 Type 2 diabetes mellitus with hyperglycemia: Secondary | ICD-10-CM | POA: Diagnosis not present

## 2023-10-25 DIAGNOSIS — Z794 Long term (current) use of insulin: Secondary | ICD-10-CM | POA: Diagnosis not present

## 2023-10-25 DIAGNOSIS — E1169 Type 2 diabetes mellitus with other specified complication: Secondary | ICD-10-CM | POA: Diagnosis not present

## 2023-10-25 DIAGNOSIS — I10 Essential (primary) hypertension: Secondary | ICD-10-CM | POA: Diagnosis not present

## 2023-10-25 DIAGNOSIS — B351 Tinea unguium: Secondary | ICD-10-CM | POA: Diagnosis not present

## 2023-10-25 DIAGNOSIS — M79674 Pain in right toe(s): Secondary | ICD-10-CM | POA: Diagnosis not present

## 2023-10-25 NOTE — Progress Notes (Signed)
This patient returns to my office for at risk foot care.  This patient requires this care by a professional since this patient will be at risk due to having diabetes.  This patient is unable to cut nails himself since the patient cannot reach his nails.These nails are painful walking and wearing shoes.  This patient presents for at risk foot care today.  General Appearance  Alert, conversant and in no acute stress.  Vascular  Dorsalis pedis and posterior tibial  pulses are palpable  bilaterally.  Capillary return is within normal limits  bilaterally. Temperature is within normal limits  bilaterally.  Neurologic  Senn-Weinstein monofilament wire test within normal limits  bilaterally. Muscle power within normal limits bilaterally.  Nails Thick disfigured discolored nails with subungual debris  from hallux to fifth toes right foot. No evidence of bacterial infection or drainage bilaterally. Absent 1,2 left toenails.  Orthopedic  No limitations of motion  feet .  No crepitus or effusions noted.  No bony pathology or digital deformities noted.  Skin  normotropic skin with no porokeratosis noted bilaterally.  No signs of infections or ulcers noted.     Onychomycosis  Pain in right toes    Consent was obtained for treatment procedures.   Mechanical debridement of nails 1-5  right and 3-5 left foot. performed with a nail nipper.  Filed with dremel without incident.    Return office visit    3 months                  Told patient to return for periodic foot care and evaluation due to potential at risk complications.   Helane Gunther DPM

## 2023-11-03 ENCOUNTER — Other Ambulatory Visit: Payer: Self-pay

## 2023-11-03 ENCOUNTER — Ambulatory Visit (INDEPENDENT_AMBULATORY_CARE_PROVIDER_SITE_OTHER): Admitting: Orthopedic Surgery

## 2023-11-03 DIAGNOSIS — M25552 Pain in left hip: Secondary | ICD-10-CM | POA: Diagnosis not present

## 2023-11-03 DIAGNOSIS — Z96642 Presence of left artificial hip joint: Secondary | ICD-10-CM

## 2023-11-03 DIAGNOSIS — Z96641 Presence of right artificial hip joint: Secondary | ICD-10-CM | POA: Diagnosis not present

## 2023-11-03 DIAGNOSIS — M25551 Pain in right hip: Secondary | ICD-10-CM | POA: Diagnosis not present

## 2023-11-03 DIAGNOSIS — M1712 Unilateral primary osteoarthritis, left knee: Secondary | ICD-10-CM

## 2023-11-03 DIAGNOSIS — M1711 Unilateral primary osteoarthritis, right knee: Secondary | ICD-10-CM

## 2023-11-03 DIAGNOSIS — M170 Bilateral primary osteoarthritis of knee: Secondary | ICD-10-CM

## 2023-11-03 MED ORDER — METHYLPREDNISOLONE ACETATE 40 MG/ML IJ SUSP
40.0000 mg | Freq: Once | INTRAMUSCULAR | Status: AC
  Administered 2023-11-03: 40 mg via INTRA_ARTICULAR

## 2023-11-03 MED ORDER — METHYLPREDNISOLONE ACETATE 40 MG/ML IJ SUSP
40.0000 mg | Freq: Once | INTRAMUSCULAR | Status: AC
Start: 2023-11-03 — End: 2023-11-03
  Administered 2023-11-03: 40 mg via INTRA_ARTICULAR

## 2023-11-03 NOTE — Progress Notes (Signed)
    11/03/2023   Chief Complaint  Patient presents with   Knee Pain    Bilat    Hip Pain    Encounter Diagnoses  Name Primary?   Bilateral hip pain Yes   Primary osteoarthritis of left knee    Primary osteoarthritis of right knee    History of total hip replacement, left    History of total hip replacement, right    Lawrence Jordan comes in today with bilateral knee pain bilateral knee effusions bilateral leg edema and he would like to have his hip x-rayed right and left for evaluation of prior total hip arthroplasty the most recent one done in 2016 in [place]  He reports difficulty ambulating some occasional shortness of breath he has a history of multiple different types of arrhythmia he has significant peripheral edema diabetes hypertension  He is struggling to get around is using a cane  The knee examination is very similar right to left basically both knees have effusions with medial and lateral joint line tenderness without warmth.  He does have limited range of motion in terms of flexing the knee on each side but both knees remain stable bilateral peripheral edema is pitting skin slightly red from chronic edema  Hip films were done the hips look stable they are both press-fit designs with acetabular screws.  The stems are) design distally  No sign of loosening  Recommend aspiration injection both knees  Procedure note injection and aspiration left knee joint  Verbal consent was obtained to aspirate and inject the left knee joint   Timeout was completed to confirm the site of aspiration and injection  An 18-gauge needle was used to aspirate the left knee joint from a suprapatellar lateral approach.  The medications used were 40 mg of Depo-Medrol  and 1% lidocaine  3 cc  Anesthesia was provided by ethyl chloride and the skin was prepped with alcohol .  After cleaning the skin with alcohol  an 18-gauge needle was used to aspirate the right knee joint.  We obtained 50 cc of  fluid yellow  We followed this by injection of 40 mg of Depo-Medrol  and 3 cc 1% lidocaine .  There were no complications. A sterile bandage was applied.   Procedure note injection and aspiration right knee joint  Verbal consent was obtained to aspirate and inject the right knee joint   Timeout was completed to confirm the site of aspiration and injection  An 18-gauge needle was used to aspirate the knee joint from a suprapatellar lateral approach.  The medications used were 40 mg of Depo-Medrol  and 1% lidocaine  3 cc  Anesthesia was provided by ethyl chloride and the skin was prepped with alcohol .  After cleaning the skin with alcohol  an 18-gauge needle was used to aspirate the right knee joint.  We obtained 35  cc of fluid yellow  We follow this by injection of 40 mg of Depo-Medrol  and 3 cc 1% lidocaine .  There were no complications. A sterile bandage was applied.    I discussed with Mr. Ky weight loss and that we need to do knee replacements on him to improve his function but the weight is preventing us  from doing that.  He also has the cardiac arrhythmias which are an issue and he seems to be short of breath which tells me he is holding onto fluid and should be evaluated for heart failure as well  Follow-up 3 to 6 months x-ray both knees

## 2023-11-03 NOTE — Progress Notes (Signed)
    11/03/2023   Chief Complaint  Patient presents with   Knee Pain    Bilat    Hip Pain    No diagnosis found.  What pharmacy do you use ? [facility]    Worse

## 2023-11-11 ENCOUNTER — Other Ambulatory Visit: Payer: Self-pay | Admitting: Cardiovascular Disease

## 2023-11-11 DIAGNOSIS — I4819 Other persistent atrial fibrillation: Secondary | ICD-10-CM

## 2023-11-11 NOTE — Telephone Encounter (Signed)
 Prescription refill request for Eliquis  received. Indication:afib Last office visit:9/25 Scr:0.97  8/25 Age: 73 Weight:155.6  kg  Prescription refilled

## 2023-11-16 DIAGNOSIS — I10 Essential (primary) hypertension: Secondary | ICD-10-CM | POA: Diagnosis not present

## 2023-11-16 DIAGNOSIS — E1165 Type 2 diabetes mellitus with hyperglycemia: Secondary | ICD-10-CM | POA: Diagnosis not present

## 2023-11-16 DIAGNOSIS — K760 Fatty (change of) liver, not elsewhere classified: Secondary | ICD-10-CM | POA: Diagnosis not present

## 2023-11-16 LAB — LAB REPORT - SCANNED
A1c: 8
EGFR: 95.3

## 2023-11-17 ENCOUNTER — Ambulatory Visit: Admitting: Orthopedic Surgery

## 2023-11-17 NOTE — Telephone Encounter (Signed)
 Remote transmission received: (11/16/23 AT 12:03PM.   Patient has remained out of rhythm since 11/14/23 with fast ventricular rates at times and on presenting.   Amio decreased to 200mg  daily at 09/30/23 OV with Dr. [name].    Sent patient a my chart message to assess if any symptoms and will forward to AF clinic and Dr. [name] as an update.

## 2023-11-21 DIAGNOSIS — I509 Heart failure, unspecified: Secondary | ICD-10-CM | POA: Diagnosis not present

## 2023-11-21 DIAGNOSIS — I482 Chronic atrial fibrillation, unspecified: Secondary | ICD-10-CM | POA: Diagnosis not present

## 2023-11-21 DIAGNOSIS — N401 Enlarged prostate with lower urinary tract symptoms: Secondary | ICD-10-CM | POA: Diagnosis not present

## 2023-11-21 DIAGNOSIS — I10 Essential (primary) hypertension: Secondary | ICD-10-CM | POA: Diagnosis not present

## 2023-11-21 DIAGNOSIS — Z6841 Body Mass Index (BMI) 40.0 and over, adult: Secondary | ICD-10-CM | POA: Diagnosis not present

## 2023-11-21 DIAGNOSIS — M549 Dorsalgia, unspecified: Secondary | ICD-10-CM | POA: Diagnosis not present

## 2023-11-21 DIAGNOSIS — E1165 Type 2 diabetes mellitus with hyperglycemia: Secondary | ICD-10-CM | POA: Diagnosis not present

## 2023-11-25 DIAGNOSIS — E1169 Type 2 diabetes mellitus with other specified complication: Secondary | ICD-10-CM | POA: Diagnosis not present

## 2023-11-25 DIAGNOSIS — I10 Essential (primary) hypertension: Secondary | ICD-10-CM | POA: Diagnosis not present

## 2023-11-26 ENCOUNTER — Other Ambulatory Visit: Payer: Self-pay | Admitting: Cardiovascular Disease

## 2023-11-28 ENCOUNTER — Encounter: Payer: Self-pay | Admitting: Radiology

## 2023-11-28 DIAGNOSIS — M7918 Myalgia, other site: Secondary | ICD-10-CM | POA: Diagnosis not present

## 2023-11-28 DIAGNOSIS — M542 Cervicalgia: Secondary | ICD-10-CM | POA: Diagnosis not present

## 2023-11-28 DIAGNOSIS — Z6841 Body Mass Index (BMI) 40.0 and over, adult: Secondary | ICD-10-CM | POA: Diagnosis not present

## 2023-11-28 DIAGNOSIS — M7502 Adhesive capsulitis of left shoulder: Secondary | ICD-10-CM | POA: Diagnosis not present

## 2023-11-29 ENCOUNTER — Ambulatory Visit (INDEPENDENT_AMBULATORY_CARE_PROVIDER_SITE_OTHER): Payer: Medicare Other

## 2023-11-29 DIAGNOSIS — H35371 Puckering of macula, right eye: Secondary | ICD-10-CM | POA: Diagnosis not present

## 2023-11-29 DIAGNOSIS — I4819 Other persistent atrial fibrillation: Secondary | ICD-10-CM

## 2023-11-29 DIAGNOSIS — H04123 Dry eye syndrome of bilateral lacrimal glands: Secondary | ICD-10-CM | POA: Diagnosis not present

## 2023-11-29 DIAGNOSIS — H401213 Low-tension glaucoma, right eye, severe stage: Secondary | ICD-10-CM | POA: Diagnosis not present

## 2023-11-29 DIAGNOSIS — H401222 Low-tension glaucoma, left eye, moderate stage: Secondary | ICD-10-CM | POA: Diagnosis not present

## 2023-11-29 DIAGNOSIS — H02125 Mechanical ectropion of left lower eyelid: Secondary | ICD-10-CM | POA: Diagnosis not present

## 2023-11-29 DIAGNOSIS — H25813 Combined forms of age-related cataract, bilateral: Secondary | ICD-10-CM | POA: Diagnosis not present

## 2023-11-29 LAB — CUP PACEART REMOTE DEVICE CHECK
Battery Remaining Longevity: 74 mo
Battery Remaining Percentage: 72 %
Battery Voltage: 3.01 V
Brady Statistic AP VP Percent: 1 %
Brady Statistic AP VS Percent: 97 %
Brady Statistic AS VP Percent: 1 %
Brady Statistic AS VS Percent: 1.4 %
Brady Statistic RA Percent Paced: 81 %
Brady Statistic RV Percent Paced: 3.4 %
Date Time Interrogation Session: 20251104030428
Implantable Lead Connection Status: 753985
Implantable Lead Connection Status: 753985
Implantable Lead Implant Date: 20221108
Implantable Lead Implant Date: 20221108
Implantable Lead Location: 753859
Implantable Lead Location: 753860
Implantable Pulse Generator Implant Date: 20221108
Lead Channel Impedance Value: 440 Ohm
Lead Channel Impedance Value: 450 Ohm
Lead Channel Pacing Threshold Amplitude: 0.5 V
Lead Channel Pacing Threshold Amplitude: 1 V
Lead Channel Pacing Threshold Pulse Width: 0.5 ms
Lead Channel Pacing Threshold Pulse Width: 0.5 ms
Lead Channel Sensing Intrinsic Amplitude: 10.6 mV
Lead Channel Sensing Intrinsic Amplitude: 5 mV
Lead Channel Setting Pacing Amplitude: 2 V
Lead Channel Setting Pacing Amplitude: 2.5 V
Lead Channel Setting Pacing Pulse Width: 0.5 ms
Lead Channel Setting Sensing Sensitivity: 2 mV
Pulse Gen Model: 2272
Pulse Gen Serial Number: 3968031

## 2023-12-01 ENCOUNTER — Ambulatory Visit: Payer: Self-pay | Admitting: Cardiovascular Disease

## 2023-12-05 NOTE — Progress Notes (Signed)
 Remote PPM Transmission

## 2023-12-07 ENCOUNTER — Encounter: Payer: Self-pay | Admitting: Endocrinology

## 2023-12-07 ENCOUNTER — Ambulatory Visit (INDEPENDENT_AMBULATORY_CARE_PROVIDER_SITE_OTHER): Admitting: Endocrinology

## 2023-12-07 DIAGNOSIS — E1165 Type 2 diabetes mellitus with hyperglycemia: Secondary | ICD-10-CM

## 2023-12-07 DIAGNOSIS — Z794 Long term (current) use of insulin: Secondary | ICD-10-CM | POA: Diagnosis not present

## 2023-12-07 MED ORDER — TRESIBA FLEXTOUCH 200 UNIT/ML ~~LOC~~ SOPN: 90.0000 [IU] | PEN_INJECTOR | Freq: Every day | SUBCUTANEOUS | 4 refills | Status: DC

## 2023-12-07 MED ORDER — METFORMIN HCL 1000 MG PO TABS
ORAL_TABLET | ORAL | 2 refills | Status: AC
Start: 2023-12-07 — End: ?

## 2023-12-07 MED ORDER — OZEMPIC (2 MG/DOSE) 8 MG/3ML ~~LOC~~ SOPN
PEN_INJECTOR | SUBCUTANEOUS | 3 refills | Status: AC
Start: 2023-12-07 — End: ?
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
## 2023-12-09 ENCOUNTER — Encounter: Payer: Self-pay | Admitting: Cardiovascular Disease

## 2023-12-09 ENCOUNTER — Ambulatory Visit: Attending: Cardiovascular Disease | Admitting: Cardiovascular Disease

## 2023-12-09 VITALS — BP 134/76 | HR 106 | Ht 71.0 in | Wt 344.0 lb

## 2023-12-09 DIAGNOSIS — R001 Bradycardia, unspecified: Secondary | ICD-10-CM | POA: Insufficient documentation

## 2023-12-09 DIAGNOSIS — I4819 Other persistent atrial fibrillation: Secondary | ICD-10-CM | POA: Diagnosis not present

## 2023-12-09 DIAGNOSIS — I495 Sick sinus syndrome: Secondary | ICD-10-CM | POA: Insufficient documentation

## 2023-12-09 DIAGNOSIS — Z79899 Other long term (current) drug therapy: Secondary | ICD-10-CM | POA: Insufficient documentation

## 2023-12-09 DIAGNOSIS — Z5181 Encounter for therapeutic drug level monitoring: Secondary | ICD-10-CM | POA: Diagnosis not present

## 2023-12-09 DIAGNOSIS — I484 Atypical atrial flutter: Secondary | ICD-10-CM | POA: Diagnosis not present

## 2023-12-09 LAB — CUP PACEART INCLINIC DEVICE CHECK
Battery Remaining Longevity: 76 mo
Battery Voltage: 3.01 V
Brady Statistic RA Percent Paced: 81 %
Brady Statistic RV Percent Paced: 3.5 %
Date Time Interrogation Session: 20251114134806
Implantable Lead Connection Status: 753985
Implantable Lead Connection Status: 753985
Implantable Lead Implant Date: 20221108
Implantable Lead Implant Date: 20221108
Implantable Lead Location: 753859
Implantable Lead Location: 753860
Implantable Pulse Generator Implant Date: 20221108
Lead Channel Impedance Value: 450 Ohm
Lead Channel Impedance Value: 475 Ohm
Lead Channel Pacing Threshold Amplitude: 0.75 V
Lead Channel Pacing Threshold Amplitude: 0.75 V
Lead Channel Pacing Threshold Pulse Width: 0.5 ms
Lead Channel Pacing Threshold Pulse Width: 0.5 ms
Lead Channel Sensing Intrinsic Amplitude: 12 mV
Lead Channel Sensing Intrinsic Amplitude: 5 mV
Lead Channel Setting Pacing Amplitude: 2 V
Lead Channel Setting Pacing Amplitude: 2.5 V
Lead Channel Setting Pacing Pulse Width: 0.5 ms
Lead Channel Setting Sensing Sensitivity: 2 mV
Pulse Gen Model: 2272
Pulse Gen Serial Number: 3968031

## 2023-12-09 MED ORDER — METOPROLOL SUCCINATE ER 50 MG PO TB24
100.0000 mg | ORAL_TABLET | Freq: Two times a day (BID) | ORAL | 1 refills | Status: DC
Start: 2023-12-09 — End: 2023-12-20

## 2023-12-09 NOTE — Patient Instructions (Addendum)
 Medication Instructions:  Your physician has recommended you make the following change in your medication:  Increase Metoprolol  XL to 100mg  (2 tablets) by mouth twice daily.  *If you need a refill on your cardiac medications before your next appointment, please call your pharmacy*  Lab Work: None ordered.  If you have labs (blood work) drawn today and your tests are completely normal, you will receive your results only by: MyChart Message (if you have MyChart) OR A paper copy in the mail If you have any lab test that is abnormal or we need to change your treatment, we will call you to review the results.  Testing/Procedures: None ordered.   Follow-Up: At [facility], you and your health needs are our priority.  As part of our continuing mission to provide you with exceptional heart care, our providers are all part of one team.  This team includes your primary Cardiologist (physician) and Advanced Practice Providers or APPs (Physician Assistants and Nurse Practitioners) who all work together to provide you with the care you need, when you need it.  Your next appointment:   Monday, 01/02/2024 at 3:15 with Dr [name]

## 2023-12-09 NOTE — Progress Notes (Signed)
 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 male who presents today for routine electrophysiology followup.  Since last being seen in our clinic, the patient reports doing reasonably well.    He was very fatigued due to recurrent anemia due to GI losses. This appears to be under control now. I had referred him for a Watchman, but the procedure was deferred since his GI bleed appeared resolved.  He has had 2 ablations for atrial fibrillation by Dr. Kelsie.  The most recent was in October 2021.  Pulmonary vein isolation was reinforced and a posterior wall ablation performed.  Dr. Kelsie noted that the patient had a complex atypical flutter with variable cycle length that was not mappable.  Pacemaker was placed in 2022 due to issues with tachy-brady and difficult rate control.  He underwent Tikosyn  load in May 2024.  Despite the Tikosyn , he has had a few breakthrough episodes of atrial flutter, sometimes with rapid rates.  He had discontinue metoprolol , so this was resumed. He reports that he is doing well.   He was admitted on December 26, 2022  with severe tachycardia, heart rates in excess of 200 bpm.  He had missed a dose of metoprolol  EGM's most consistent with one-to-one flutter.  The arrhythmia was not responsive to adenosine .  He was eventually DC cardioverted with a single 100 J shock.  He switched from Tikosyn  to amiodarone  but he continues to have recurrences of AF, often with poorly controlled rates.  His AF burden has been about 15%. With recent occurrence, the amiodarone  was increased to 200mg  BID, and he was tentatively scheduled for an AVJ ablation.  Today, he presents for follow-up to discuss AVJ ablation and alternative therapies.    Physical Exam: Vitals:   12/09/23 1225  BP: 134/76  Pulse: (!) 106  SpO2: 98%  Weight: (!) 344 lb (156 kg)  Height: 5' 11 (1.803 m)      Gen: Appears comfortable, well-nourished CV: tachy,  regular, + dependent edema. No murmurs, rubs. Basilar rales clear with deep breaths. No JVD sitting upright Pulm: breathing easily   Pacemaker interrogation- reviewed in detail today,  See PACEART report   EKG Interpretation Date/Time:  Friday December 09 2023 12:34:03 EST Ventricular Rate:  106 PR Interval:    QRS Duration:  92 QT Interval:  370 QTC Calculation: 491 R Axis:   42  Text Interpretation: Atrial fibrillation with rapid ventricular response When compared with ECG of 09-Dec-2023 12:33, Atrial fibrillation has replaced Electronic ventricular pacemaker Confirmed by [name] Scotts [phone]) on 12/09/2023 12:43:27 PM   Assessment and Plan:  Persistent afib and atrial flutter He had recurrence of atrial flutter despite Tikosyn   Having rare but sustained episodes of atrial arrhythmia on amiodarone   Will decrease amiodarone  back down to 200 mg daily Labs September 09, 2023 reviewed He has had acute CHF in the past due to AF/flutter, Chads2vasc score is 6.   Continue Eliquis  5 mg twice daily Will increase metoprolol  to 100mg  PO BID (will give additional 50mg  tabs) Decrease amiodarone  back to 200mg  daily Will follow up in 2-3 weeks to assess rate control. If no success, will proceed with AVJ ablation. We briefly discussed the procedure today.  Symptomatic sinus bradycardia  Normal pacemaker function See Elisabeth Art report he is not device dependant today  HTN Stable No change required today  Anemia Secondary to GI bleeding/ iron  deficiency anemia Follows closely with  GI (Dr Cindie)  Obesity Body mass index is 47.98 kg/m. Lifestyle modification advised  OSA Compliance with CPAP advised  Chronic diastolic dysfunction Stable No change required today  Follow-up 3 months  Lawrence FORBES Furbish, MD  12/09/2023 12:43 PM

## 2023-12-14 ENCOUNTER — Encounter: Payer: Self-pay | Admitting: Cardiovascular Disease

## 2023-12-20 MED ORDER — METOPROLOL SUCCINATE ER 100 MG PO TB24: 100.0000 mg | ORAL_TABLET | Freq: Two times a day (BID) | ORAL | 0 refills | Status: DC

## 2023-12-21 ENCOUNTER — Encounter: Payer: Self-pay | Admitting: Endocrinology

## 2023-12-21 NOTE — Telephone Encounter (Signed)
 There we will be alternative for the Tresiba  based on your insurance formulary.  We can switch to one of the long-acting insulin  when you need.

## 2023-12-23 DIAGNOSIS — I10 Essential (primary) hypertension: Secondary | ICD-10-CM | POA: Diagnosis not present

## 2023-12-23 DIAGNOSIS — E1169 Type 2 diabetes mellitus with other specified complication: Secondary | ICD-10-CM | POA: Diagnosis not present

## 2023-12-24 ENCOUNTER — Emergency Department (HOSPITAL_COMMUNITY)

## 2023-12-24 ENCOUNTER — Other Ambulatory Visit: Payer: Self-pay

## 2023-12-24 ENCOUNTER — Inpatient Hospital Stay (HOSPITAL_COMMUNITY)
Admission: EM | Admit: 2023-12-24 | Discharge: 2023-12-27 | DRG: 291 | Disposition: A | Attending: Family Medicine | Admitting: Family Medicine

## 2023-12-24 ENCOUNTER — Encounter (HOSPITAL_COMMUNITY): Payer: Self-pay | Admitting: Internal Medicine

## 2023-12-24 DIAGNOSIS — G4733 Obstructive sleep apnea (adult) (pediatric): Secondary | ICD-10-CM | POA: Diagnosis present

## 2023-12-24 DIAGNOSIS — Z96641 Presence of right artificial hip joint: Secondary | ICD-10-CM | POA: Diagnosis present

## 2023-12-24 DIAGNOSIS — Z6841 Body Mass Index (BMI) 40.0 and over, adult: Secondary | ICD-10-CM | POA: Diagnosis not present

## 2023-12-24 DIAGNOSIS — E1165 Type 2 diabetes mellitus with hyperglycemia: Secondary | ICD-10-CM | POA: Diagnosis present

## 2023-12-24 DIAGNOSIS — I110 Hypertensive heart disease with heart failure: Secondary | ICD-10-CM | POA: Diagnosis present

## 2023-12-24 DIAGNOSIS — I2510 Atherosclerotic heart disease of native coronary artery without angina pectoris: Secondary | ICD-10-CM | POA: Diagnosis present

## 2023-12-24 DIAGNOSIS — Z85828 Personal history of other malignant neoplasm of skin: Secondary | ICD-10-CM | POA: Diagnosis not present

## 2023-12-24 DIAGNOSIS — Z1152 Encounter for screening for COVID-19: Secondary | ICD-10-CM | POA: Diagnosis not present

## 2023-12-24 DIAGNOSIS — J9601 Acute respiratory failure with hypoxia: Secondary | ICD-10-CM | POA: Diagnosis not present

## 2023-12-24 DIAGNOSIS — I5033 Acute on chronic diastolic (congestive) heart failure: Secondary | ICD-10-CM | POA: Diagnosis present

## 2023-12-24 DIAGNOSIS — I509 Heart failure, unspecified: Secondary | ICD-10-CM | POA: Diagnosis not present

## 2023-12-24 DIAGNOSIS — R9431 Abnormal electrocardiogram [ECG] [EKG]: Secondary | ICD-10-CM | POA: Diagnosis not present

## 2023-12-24 DIAGNOSIS — E7800 Pure hypercholesterolemia, unspecified: Secondary | ICD-10-CM | POA: Diagnosis present

## 2023-12-24 DIAGNOSIS — R0602 Shortness of breath: Secondary | ICD-10-CM | POA: Diagnosis not present

## 2023-12-24 DIAGNOSIS — R079 Chest pain, unspecified: Secondary | ICD-10-CM

## 2023-12-24 DIAGNOSIS — Z794 Long term (current) use of insulin: Secondary | ICD-10-CM | POA: Diagnosis not present

## 2023-12-24 DIAGNOSIS — J9621 Acute and chronic respiratory failure with hypoxia: Secondary | ICD-10-CM | POA: Diagnosis present

## 2023-12-24 DIAGNOSIS — Z79899 Other long term (current) drug therapy: Secondary | ICD-10-CM | POA: Diagnosis not present

## 2023-12-24 DIAGNOSIS — Z833 Family history of diabetes mellitus: Secondary | ICD-10-CM | POA: Diagnosis not present

## 2023-12-24 DIAGNOSIS — I4819 Other persistent atrial fibrillation: Secondary | ICD-10-CM

## 2023-12-24 DIAGNOSIS — I484 Atypical atrial flutter: Secondary | ICD-10-CM | POA: Diagnosis present

## 2023-12-24 DIAGNOSIS — I517 Cardiomegaly: Secondary | ICD-10-CM | POA: Diagnosis not present

## 2023-12-24 DIAGNOSIS — I161 Hypertensive emergency: Secondary | ICD-10-CM | POA: Diagnosis present

## 2023-12-24 DIAGNOSIS — I495 Sick sinus syndrome: Secondary | ICD-10-CM | POA: Diagnosis present

## 2023-12-24 DIAGNOSIS — D509 Iron deficiency anemia, unspecified: Secondary | ICD-10-CM | POA: Diagnosis present

## 2023-12-24 DIAGNOSIS — Z7901 Long term (current) use of anticoagulants: Secondary | ICD-10-CM | POA: Diagnosis not present

## 2023-12-24 DIAGNOSIS — R918 Other nonspecific abnormal finding of lung field: Secondary | ICD-10-CM | POA: Diagnosis not present

## 2023-12-24 DIAGNOSIS — I5021 Acute systolic (congestive) heart failure: Secondary | ICD-10-CM | POA: Diagnosis not present

## 2023-12-24 DIAGNOSIS — Z950 Presence of cardiac pacemaker: Secondary | ICD-10-CM | POA: Diagnosis not present

## 2023-12-24 DIAGNOSIS — Z7984 Long term (current) use of oral hypoglycemic drugs: Secondary | ICD-10-CM | POA: Diagnosis not present

## 2023-12-24 DIAGNOSIS — Z8249 Family history of ischemic heart disease and other diseases of the circulatory system: Secondary | ICD-10-CM | POA: Diagnosis not present

## 2023-12-24 DIAGNOSIS — R0989 Other specified symptoms and signs involving the circulatory and respiratory systems: Secondary | ICD-10-CM | POA: Diagnosis not present

## 2023-12-24 LAB — COMPREHENSIVE METABOLIC PANEL WITH GFR
ALT: 35 U/L (ref 0–44)
AST: 41 U/L (ref 15–41)
Albumin: 4.5 g/dL (ref 3.5–5.0)
Alkaline Phosphatase: 82 U/L (ref 38–126)
Anion gap: 13 (ref 5–15)
BUN: 10 mg/dL (ref 8–23)
CO2: 23 mmol/L (ref 22–32)
Calcium: 9.1 mg/dL (ref 8.9–10.3)
Chloride: 100 mmol/L (ref 98–111)
Creatinine, Ser: 0.76 mg/dL (ref 0.61–1.24)
GFR, Estimated: >60 mL/min (ref >60)
Glucose, Bld: 188 mg/dL — ABNORMAL HIGH (ref 70–99)
Potassium: 4.5 mmol/L (ref 3.5–5.1)
Sodium: 136 mmol/L (ref 135–145)
Total Bilirubin: 0.6 mg/dL (ref 0.0–1.2)
Total Protein: 8 g/dL (ref 6.5–8.1)

## 2023-12-24 LAB — CBC WITH DIFFERENTIAL/PLATELET
Abs Immature Granulocytes: 0.05 K/uL (ref 0.00–0.07)
Basophils Absolute: 0.1 K/uL (ref 0.0–0.1)
Basophils Relative: 1 %
Eosinophils Absolute: 0.1 K/uL (ref 0.0–0.5)
Eosinophils Relative: 1 %
HCT: 44.1 % (ref 39.0–52.0)
Hemoglobin: 14.6 g/dL (ref 13.0–17.0)
Immature Granulocytes: 1 %
Lymphocytes Relative: 15 %
Lymphs Abs: 1.5 K/uL (ref 0.7–4.0)
MCH: 32 pg (ref 26.0–34.0)
MCHC: 33.1 g/dL (ref 30.0–36.0)
MCV: 96.7 fL (ref 80.0–100.0)
Monocytes Absolute: 0.7 K/uL (ref 0.1–1.0)
Monocytes Relative: 8 %
Neutro Abs: 7.1 K/uL (ref 1.7–7.7)
Neutrophils Relative %: 74 %
Platelets: 168 K/uL (ref 150–400)
RBC: 4.56 MIL/uL (ref 4.22–5.81)
RDW: 13.3 % (ref 11.5–15.5)
WBC: 9.5 K/uL (ref 4.0–10.5)
nRBC: 0 % (ref 0.0–0.2)

## 2023-12-24 LAB — PRO BRAIN NATRIURETIC PEPTIDE: Pro Brain Natriuretic Peptide: 159 pg/mL (ref <300.0)

## 2023-12-24 LAB — LACTIC ACID, PLASMA
Lactic Acid, Venous: 1.7 mmol/L (ref 0.5–1.9)
Lactic Acid, Venous: 2 mmol/L (ref 0.5–1.9)
Lactic Acid, Venous: 2.3 mmol/L (ref 0.5–1.9)

## 2023-12-24 LAB — TROPONIN T, HIGH SENSITIVITY
Troponin T High Sensitivity: 20 ng/L — ABNORMAL HIGH (ref 0–19)
Troponin T High Sensitivity: <15 ng/L (ref 0–19)

## 2023-12-24 LAB — TSH: TSH: 2.32 u[IU]/mL (ref 0.350–4.500)

## 2023-12-24 MED ORDER — SODIUM CHLORIDE 0.9 % IV SOLN
500.0000 mg | INTRAVENOUS | Status: DC
  Administered 2023-12-25: 500 mg via INTRAVENOUS
  Filled 2023-12-24: qty 5

## 2023-12-24 MED ORDER — NITROGLYCERIN IN D5W 200-5 MCG/ML-% IV SOLN: 5.0000 ug/min | INTRAVENOUS | Status: DC

## 2023-12-24 MED ORDER — FUROSEMIDE 10 MG/ML IJ SOLN
40.0000 mg | INTRAMUSCULAR | Status: AC
  Administered 2023-12-24: 40 mg via INTRAVENOUS
  Filled 2023-12-24: qty 4

## 2023-12-24 MED ORDER — SODIUM CHLORIDE 0.9 % IV SOLN
1.0000 g | Freq: Once | INTRAVENOUS | Status: AC
  Administered 2023-12-24: 1 g via INTRAVENOUS
  Filled 2023-12-24: qty 10
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
## 2023-12-24 NOTE — ED Provider Notes (Addendum)
 [place] EMERGENCY DEPARTMENT AT [facility] Provider Note   CSN: [id number] Arrival date & time: 12/24/23  2125     Patient presents with: Shortness of Breath   Lawrence Jordan is a 73 y.o. male.    Shortness of Breath  This patient is a 73 year old male, he presents to the hospital with a complaint of shortness of breath, he has a known history of atrial fibrillation and flutter on amiodarone  200 mg twice a day, he is on Lipitor for cholesterol, Eliquis  because of atrial fibrillation, insulin  for diabetes as well as metformin  and takes metoprolol .  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  He has had multiple ablations in the past, he most recently saw his cardiologist within the last month and was asked to go back down to amiodarone  once a day, he is continue twice a day.  He has been short of breath for the last week but over the last 10 hours the patient has had severe shortness of breath and is now in a tripod position having trouble breathing.  He does have a known history of congestive heart failure and he is carrying a significant amount of fluid on his legs at this time as well.  He takes 20 mg of Lasix  as needed daily for swelling he denies fevers or chills nausea or vomiting or diarrhea    Prior to Admission medications   Medication Sig Start Date End Date Taking? Authorizing Provider  acetaminophen  (TYLENOL ) 650 MG CR tablet Take 1,300 mg by mouth every 8 (eight) hours as needed for pain.    [provider]  amiodarone  (PACERONE ) 200 MG tablet Take 1 tablet (200 mg total) by mouth daily. 09/30/23   Mealor, Augustus E, MD  atorvastatin  (LIPITOR) 20 MG tablet Take 20 mg by mouth daily. 04/29/23   [provider]  Blood Glucose Monitoring Suppl (ONETOUCH VERIO) w/Device KIT Use to check blood sugar twice a day 07/31/21   Von [place], MD  calcium  carbonate (TUMS EX) 750 MG chewable tablet Chew 1,500 mg by mouth as needed.     [provider]  Calcium  Carbonate-Vitamin D  (CALCIUM -D PO) Take 1 tablet by mouth daily.    [provider]  docusate sodium  (COLACE) 100 MG capsule Take 100 mg by mouth every other day.    [provider]  dorzolamide -timolol  (COSOPT ) 2-0.5 % ophthalmic solution Place 1 drop into the right eye 2 (two) times daily. Patient taking differently: Place 1 drop into both eyes 2 (two) times daily. 08/22/23   [provider]  ELIQUIS  5 MG TABS tablet TAKE 1 TABLET BY MOUTH TWICE A DAY 11/11/23   Mealor, Augustus E, MD  furosemide  (LASIX ) 20 MG tablet TAKE 1 TABLET (20 MG TOTAL) BY MOUTH DAILY. MAY TAKE AN EXTRA TABLET DAILY AS NEEDED FOR WEIGHT GAIN 06/23/21   Allred, Lynwood, MD  gabapentin  (NEURONTIN ) 100 MG capsule Take 200 mg by mouth at bedtime. 09/16/22   [provider]  glucose blood (ONETOUCH VERIO) test strip USE TO CHECK BLOOD SUGAR 2 TIMES DAILY 08/18/22   Von [place], MD  insulin  degludec (TRESIBA  FLEXTOUCH) 200 UNIT/ML FlexTouch Pen Inject 90 Units into the skin daily. At dinner time 12/07/23   Thapa, Sudan, MD  Insulin  Pen Needle (BD PEN NEEDLE NANO 2ND GEN) 32G X 4 MM MISC USE PEN NEEDLE AS INSTRUCTED TO INJECT INSULIN  4 TIMES DAILY. 08/20/22   Von [place], MD  latanoprost  (XALATAN ) 0.005 % ophthalmic  solution Place 1 drop into both eyes at bedtime.  11/14/17   [provider]  levocetirizine (XYZAL) 5 MG tablet Take 5 mg by mouth every morning.    [provider]  losartan  (COZAAR ) 50 MG tablet Take 50 mg by mouth daily. 06/25/21   [provider]  magnesium  oxide (MAG-OX) 400 MG tablet Take 1 tablet (400 mg total) by mouth 2 (two) times daily. 07/26/23   Alvan Dorn FALCON, MD  metFORMIN  (GLUCOPHAGE ) 1000 MG tablet TAKE 1 TABLET TWICE DAILY WITH FOOD, PLEASE MAKE FOLLOW-UP APPOINTMENT 12/07/23   Thapa, Sudan, MD  metoprolol  succinate (TOPROL -XL) 100 MG 24 hr tablet Take 1 tablet (100 mg total) by mouth in the morning and at  bedtime. Take with or immediately following a meal. 12/20/23 03/19/24  Mealor, Augustus E, MD  metoprolol  tartrate (LOPRESSOR ) 25 MG tablet TAKE 1 TABLET (25 MG TOTAL) BY MOUTH 2 (TWO) TIMES DAILY AS NEEDED (BREAKTHROUGH AFIB HR OVER 100). 08/05/23 12/09/23  Terra Fairy PARAS, PA-C  Multiple Vitamin (MULTIVITAMIN) tablet Take 1 tablet by mouth daily.      [provider]  pantoprazole  (PROTONIX ) 40 MG tablet TAKE 1 TABLET (40 MG) BY MOUTH ONCE DAILY BEFORE A MEAL 08/01/23   Cindie Carlin POUR, DO  potassium chloride  SA (KLOR-CON  M20) 20 MEQ tablet Take 1 tablet (20 mEq total) by mouth 3 (three) times daily. 05/05/23   Terra Fairy PARAS, PA-C  Semaglutide , 2 MG/DOSE, (OZEMPIC , 2 MG/DOSE,) 8 MG/3ML SOPN Inject 2 mg weekly 12/07/23   Thapa, Sudan, MD  sildenafil (VIAGRA) 100 MG tablet Take 100 mg by mouth as needed for erectile dysfunction. 04/24/23   [provider]  tadalafil  (CIALIS ) 5 MG tablet Take 5 mg by mouth daily. 12/31/22   [provider]  tamsulosin  (FLOMAX ) 0.4 MG CAPS capsule Take 0.4 mg by mouth 2 (two) times daily. 03/29/19   [provider]    Allergies: Iodinated contrast media, Sulfa antibiotics, Tape, and Amoxicillin    Review of Systems  Respiratory:  Positive for shortness of breath.   All other systems reviewed and are negative.   Updated Vital Signs BP 137/70   Pulse 68   Temp 98.6 F (37 C) (Oral)   Resp (!) 32   Ht 1.803 m (5' 11)   Wt (!) 158.8 kg   SpO2 99%   BMI 48.82 kg/m   Physical Exam Vitals and nursing note reviewed.  Constitutional:      General: He is in acute distress.     Appearance: He is well-developed. He is ill-appearing.  HENT:     Head: Normocephalic and atraumatic.     Mouth/Throat:     Pharynx: No oropharyngeal exudate.  Eyes:     General: No scleral icterus.       Right eye: No discharge.        Left eye: No discharge.     Conjunctiva/sclera: Conjunctivae normal.     Pupils: Pupils are equal, round, and  reactive to light.  Neck:     Thyroid : No thyromegaly.     Vascular: No JVD.  Cardiovascular:     Rate and Rhythm: Normal rate and regular rhythm.     Heart sounds: Normal heart sounds. No murmur heard.    No friction rub. No gallop.  Pulmonary:     Effort: Respiratory distress present.     Breath sounds: Wheezing, rhonchi and rales present.  Abdominal:     General: Bowel sounds are normal. There is  no distension.     Palpations: Abdomen is soft. There is no mass.     Tenderness: There is no abdominal tenderness.  Musculoskeletal:        General: No tenderness. Normal range of motion.     Cervical back: Normal range of motion and neck supple.     Right lower leg: Edema present.     Left lower leg: Edema present.  Lymphadenopathy:     Cervical: No cervical adenopathy.  Skin:    General: Skin is warm and dry.     Findings: No erythema or rash.  Neurological:     Mental Status: He is alert.     Coordination: Coordination normal.  Psychiatric:        Behavior: Behavior normal.     (all labs ordered are listed, but only abnormal results are displayed) Labs Reviewed  COMPREHENSIVE METABOLIC PANEL WITH GFR - Abnormal; Notable for the following components:      Result Value   Glucose, Bld 188 (*)    All other components within normal limits  LACTIC ACID, PLASMA - Abnormal; Notable for the following components:   Lactic Acid, Venous 2.3 (*)    All other components within normal limits  RESP PANEL BY RT-PCR (RSV, FLU A&B, COVID)  [id number]  CULTURE, BLOOD (ROUTINE X 2)  CULTURE, BLOOD (ROUTINE X 2)  CBC WITH DIFFERENTIAL/PLATELET  PRO BRAIN NATRIURETIC PEPTIDE  TSH  LACTIC ACID, PLASMA  LACTIC ACID, PLASMA  LACTIC ACID, PLASMA  TROPONIN T, HIGH SENSITIVITY    EKG: None  Radiology: [facility] Chest Port 1 View Result Date: 12/24/2023 CLINICAL DATA:  Shortness of breath EXAM: PORTABLE CHEST 1 VIEW COMPARISON:  Chest x-ray 04/27/2023 FINDINGS: Heart is enlarged. There central  pulmonary vascular congestion and central interstitial prominence bilaterally. There is no lung consolidation, pleural effusion or pneumothorax. Left-sided pacemaker is again seen. No acute fractures are identified. IMPRESSION: Cardiomegaly with central pulmonary vascular congestion and central interstitial prominence. Findings may represent mild pulmonary edema. Electronically Signed   By: Greig Pique M.D.   On: 12/24/2023 22:00     .Critical Care  Performed by: Cleotilde Rogue, MD Authorized by: Cleotilde Rogue, MD   Critical care provider statement:    Critical care time (minutes):  45   Critical care time was exclusive of:  Separately billable procedures and treating other patients and teaching time   Critical care was necessary to treat or prevent imminent or life-threatening deterioration of the following conditions:  Cardiac failure   Critical care was time spent personally by me on the following activities:  Development of treatment plan with patient or surrogate, discussions with consultants, evaluation of patient's response to treatment, examination of patient, obtaining history from patient or surrogate, review of old charts, re-evaluation of patient's condition, pulse oximetry, ordering and review of radiographic studies, ordering and review of laboratory studies and ordering and performing treatments and interventions   I assumed direction of critical care for this patient from another provider in my specialty: no     Care discussed with: admitting provider   Comments:          Medications Ordered in the ED  nitroGLYCERIN  50 mg in dextrose  5 % 250 mL (0.2 mg/mL) infusion (0 mcg/min Intravenous Hold 12/24/23 2201)  cefTRIAXone  (ROCEPHIN ) 1 g in sodium chloride  0.9 % 100 mL IVPB (has no administration in time range)  azithromycin  (ZITHROMAX ) 500 mg in sodium chloride  0.9 % 250 mL IVPB (has no administration in time range)  furosemide  (LASIX ) injection 40 mg (40 mg Intravenous Given  12/24/23 2156)                                    Medical Decision Making Amount and/or Complexity of Data Reviewed Labs: ordered. Radiology: ordered.  Risk Prescription drug management. Decision regarding hospitalization.   This patient has diminished breath sounds bilaterally but has some scattered rales wheezing and rhonchi.  His respiratory rate is approximately 24 to 26 breaths/min speaking in shortened sentences and ill-appearing, he has significant peripheral edema and I am concerned about congestive heart failure, pneumonia, his blood pressure is severely elevated at 224/120.  At this time we will start nitroglycerin  drip, Lasix , he will need to be admitted to a high level of care in the hospital.  I suspect this is severe decompensated congestive heart failure.   Co morbidities / Chronic conditions that complicate the patient evaluation  Severe obesity, congestive heart failure, atrial fibrillation, hypertension   Additional history obtained:  Additional history obtained from EMR External records from outside source obtained and reviewed including record including cardiology notes   Lab Tests:  I Ordered, and personally interpreted labs.  The pertinent results include: BNP CBC metabolic panel and troponin, lactic acid was slightly elevated, TSH was unremarkable   Imaging Studies ordered:  I ordered imaging studies including chest x-ray I independently visualized and interpreted imaging which showed vascular congestion pulmonary edema I agree with the radiologist interpretation   Cardiac Monitoring: / EKG:  The patient was maintained on a cardiac monitor.  I personally viewed and interpreted the cardiac monitored which showed an underlying rhythm of: Normal sinus rhythm   Problem List / ED Course / Critical interventions / Medication management  The patient was in severe respiratory distress requiring not only a nitroglycerin  drip for the severe hypertension  but also BiPAP I ordered medication including glycerin drip, furosemide  Reevaluation of the patient after these medicines showed that the patient improved I have reviewed the patients home medicines and have made adjustments as needed   Consultations Obtained:  I requested consultation with the hospitalist,  and discussed lab and imaging findings as well as pertinent plan - they recommend: Admission to the hospital   Social Determinants of Health:  Morbid obesity, CHF   Test / Admission - Considered:  Admit to high level of care      Final diagnoses:  Acute respiratory failure with hypoxia (HCC)  Acute on chronic congestive heart failure, unspecified heart failure type [facility])    ED Discharge Orders     None        Cleotilde Rogue, MD 12/24/23 [id number]    Cleotilde Rogue, MD 01/02/24 1011

## 2023-12-24 NOTE — ED Notes (Signed)
Assisted pt up to bedside commode 

## 2023-12-24 NOTE — ED Triage Notes (Signed)
 Pt SOB with history of A-fib & CHF. States it has been getting worse all day. At time of triage pt sitting in tripod position & unable to complete sentences due to shortness of breath.

## 2023-12-25 ENCOUNTER — Other Ambulatory Visit: Payer: Self-pay

## 2023-12-25 ENCOUNTER — Inpatient Hospital Stay (HOSPITAL_COMMUNITY)

## 2023-12-25 DIAGNOSIS — I5021 Acute systolic (congestive) heart failure: Secondary | ICD-10-CM | POA: Diagnosis not present

## 2023-12-25 DIAGNOSIS — J9621 Acute and chronic respiratory failure with hypoxia: Secondary | ICD-10-CM | POA: Diagnosis not present

## 2023-12-25 LAB — MAGNESIUM: Magnesium: 1.9 mg/dL (ref 1.7–2.4)

## 2023-12-25 LAB — CBC WITH DIFFERENTIAL/PLATELET
Abs Immature Granulocytes: 0.02 K/uL (ref 0.00–0.07)
Basophils Absolute: 0 K/uL (ref 0.0–0.1)
Basophils Relative: 0 %
Eosinophils Absolute: 0.1 K/uL (ref 0.0–0.5)
Eosinophils Relative: 1 %
HCT: 40 % (ref 39.0–52.0)
Hemoglobin: 13.3 g/dL (ref 13.0–17.0)
Immature Granulocytes: 0 %
Lymphocytes Relative: 20 %
Lymphs Abs: 1.6 K/uL (ref 0.7–4.0)
MCH: 32 pg (ref 26.0–34.0)
MCHC: 33.3 g/dL (ref 30.0–36.0)
MCV: 96.2 fL (ref 80.0–100.0)
Monocytes Absolute: 0.8 K/uL (ref 0.1–1.0)
Monocytes Relative: 10 %
Neutro Abs: 5.5 K/uL (ref 1.7–7.7)
Neutrophils Relative %: 69 %
Platelets: 161 K/uL (ref 150–400)
RBC: 4.16 MIL/uL — ABNORMAL LOW (ref 4.22–5.81)
RDW: 13.5 % (ref 11.5–15.5)
WBC: 8 K/uL (ref 4.0–10.5)
nRBC: 0 % (ref 0.0–0.2)

## 2023-12-25 LAB — COMPREHENSIVE METABOLIC PANEL WITH GFR
ALT: 29 U/L (ref 0–44)
AST: 23 U/L (ref 15–41)
Albumin: 4.2 g/dL (ref 3.5–5.0)
Alkaline Phosphatase: 56 U/L (ref 38–126)
Anion gap: 13 (ref 5–15)
BUN: 8 mg/dL (ref 8–23)
CO2: 24 mmol/L (ref 22–32)
Calcium: 8.8 mg/dL — ABNORMAL LOW (ref 8.9–10.3)
Chloride: 103 mmol/L (ref 98–111)
Creatinine, Ser: 0.67 mg/dL (ref 0.61–1.24)
GFR, Estimated: >60 mL/min (ref >60)
Glucose, Bld: 121 mg/dL — ABNORMAL HIGH (ref 70–99)
Potassium: 3.8 mmol/L (ref 3.5–5.1)
Sodium: 140 mmol/L (ref 135–145)
Total Bilirubin: 0.6 mg/dL (ref 0.0–1.2)
Total Protein: 7.1 g/dL (ref 6.5–8.1)

## 2023-12-25 LAB — URINALYSIS, ROUTINE W REFLEX MICROSCOPIC
Bacteria, UA: NONE SEEN
Bilirubin Urine: NEGATIVE
Glucose, UA: NEGATIVE mg/dL
Ketones, ur: NEGATIVE mg/dL
Leukocytes,Ua: NEGATIVE
Nitrite: NEGATIVE
Protein, ur: NEGATIVE mg/dL
Specific Gravity, Urine: 1.008 (ref 1.005–1.030)
pH: 7 (ref 5.0–8.0)

## 2023-12-25 LAB — RESP PANEL BY RT-PCR (RSV, FLU A&B, COVID)  RVPGX2
Influenza A by PCR: NEGATIVE
Influenza B by PCR: NEGATIVE
Resp Syncytial Virus by PCR: NEGATIVE
SARS Coronavirus 2 by RT PCR: NEGATIVE

## 2023-12-25 LAB — ECHOCARDIOGRAM COMPLETE
Area-P 1/2: 3.37 cm2
Height: 71 in
MV M vel: 4.18 m/s
MV Peak grad: 69.9 mmHg
P 1/2 time: 643 ms
S' Lateral: 3.7 cm
Weight: 5600 [oz_av]

## 2023-12-25 LAB — PHOSPHORUS: Phosphorus: 3.4 mg/dL (ref 2.5–4.6)

## 2023-12-25 LAB — MRSA NEXT GEN BY PCR, NASAL: MRSA by PCR Next Gen: NOT DETECTED

## 2023-12-25 LAB — PROCALCITONIN: Procalcitonin: <0.1 ng/mL

## 2023-12-25 LAB — GLUCOSE, CAPILLARY
Glucose-Capillary: 128 mg/dL — ABNORMAL HIGH (ref 70–99)
Glucose-Capillary: 179 mg/dL — ABNORMAL HIGH (ref 70–99)
Glucose-Capillary: 105 mg/dL — ABNORMAL HIGH (ref 70–99)
Glucose-Capillary: 118 mg/dL — ABNORMAL HIGH (ref 70–99)

## 2023-12-25 LAB — TROPONIN T, HIGH SENSITIVITY
Troponin T High Sensitivity: 23 ng/L — ABNORMAL HIGH (ref 0–19)
Troponin T High Sensitivity: 22 ng/L — ABNORMAL HIGH (ref 0–19)
Troponin T High Sensitivity: 20 ng/L — ABNORMAL HIGH (ref 0–19)

## 2023-12-25 MED ORDER — ADULT MULTIVITAMIN W/MINERALS CH
1.0000 | ORAL_TABLET | Freq: Every day | ORAL | Status: DC
  Administered 2023-12-25 – 2023-12-27 (×3): 1 via ORAL
  Filled 2023-12-25 (×3): qty 1

## 2023-12-25 MED ORDER — ACETAMINOPHEN 500 MG PO TABS: 1000.0000 mg | ORAL_TABLET | Freq: Four times a day (QID) | ORAL | Status: DC | PRN

## 2023-12-25 MED ORDER — NITROGLYCERIN IN D5W 200-5 MCG/ML-% IV SOLN: 0.0000 ug/min | INTRAVENOUS | Status: DC

## 2023-12-25 MED ORDER — LATANOPROST 0.005 % OP SOLN
1.0000 [drp] | Freq: Every day | OPHTHALMIC | Status: DC
  Filled 2023-12-25: qty 2.5

## 2023-12-25 MED ORDER — MAGNESIUM SULFATE 2 GM/50ML IV SOLN
2.0000 g | Freq: Once | INTRAVENOUS | Status: AC
  Administered 2023-12-25: 2 g via INTRAVENOUS
  Filled 2023-12-25: qty 50

## 2023-12-25 MED ORDER — NITROGLYCERIN IN D5W 200-5 MCG/ML-% IV SOLN
INTRAVENOUS | Status: AC
  Administered 2023-12-25: 5 ug/min via INTRAVENOUS
  Filled 2023-12-25: qty 250

## 2023-12-25 MED ORDER — FUROSEMIDE 10 MG/ML IJ SOLN
40.0000 mg | Freq: Two times a day (BID) | INTRAMUSCULAR | Status: DC
  Administered 2023-12-25 – 2023-12-27 (×5): 40 mg via INTRAVENOUS
  Filled 2023-12-25 (×5): qty 4

## 2023-12-25 MED ORDER — LOSARTAN POTASSIUM 50 MG PO TABS
100.0000 mg | ORAL_TABLET | Freq: Every day | ORAL | Status: DC
  Administered 2023-12-25 – 2023-12-27 (×3): 100 mg via ORAL
  Filled 2023-12-25 (×3): qty 2

## 2023-12-25 MED ORDER — APIXABAN 5 MG PO TABS
5.0000 mg | ORAL_TABLET | Freq: Two times a day (BID) | ORAL | Status: DC
  Administered 2023-12-25 – 2023-12-27 (×5): 5 mg via ORAL
  Filled 2023-12-25 (×6): qty 1

## 2023-12-25 MED ORDER — INSULIN ASPART 100 UNIT/ML IJ SOLN
0.0000 [IU] | Freq: Three times a day (TID) | INTRAMUSCULAR | Status: DC
  Administered 2023-12-25: 3 [IU] via SUBCUTANEOUS
  Administered 2023-12-25: 4 [IU] via SUBCUTANEOUS
  Administered 2023-12-26: 3 [IU] via SUBCUTANEOUS
  Administered 2023-12-27: 7 [IU] via SUBCUTANEOUS
  Filled 2023-12-25 (×4): qty 1

## 2023-12-25 MED ORDER — DORZOLAMIDE HCL-TIMOLOL MAL 2-0.5 % OP SOLN
1.0000 [drp] | Freq: Two times a day (BID) | OPHTHALMIC | Status: DC
  Administered 2023-12-25 – 2023-12-27 (×4): 1 [drp] via OPHTHALMIC
  Filled 2023-12-25 (×2): qty 10

## 2023-12-25 MED ORDER — INSULIN ASPART 100 UNIT/ML IJ SOLN
4.0000 [IU] | Freq: Three times a day (TID) | INTRAMUSCULAR | Status: DC
  Administered 2023-12-25 – 2023-12-27 (×6): 4 [IU] via SUBCUTANEOUS
  Filled 2023-12-25 (×5): qty 1

## 2023-12-25 MED ORDER — AMIODARONE HCL 200 MG PO TABS
200.0000 mg | ORAL_TABLET | Freq: Every day | ORAL | Status: DC
  Administered 2023-12-25 – 2023-12-27 (×3): 200 mg via ORAL
  Filled 2023-12-25 (×3): qty 1

## 2023-12-25 MED ORDER — INSULIN GLARGINE-YFGN 100 UNIT/ML ~~LOC~~ SOLN
90.0000 [IU] | Freq: Every day | SUBCUTANEOUS | Status: DC
  Administered 2023-12-25 – 2023-12-27 (×3): 90 [IU] via SUBCUTANEOUS
  Filled 2023-12-25 (×4): qty 0.9

## 2023-12-25 MED ORDER — METOPROLOL SUCCINATE ER 50 MG PO TB24
100.0000 mg | ORAL_TABLET | Freq: Two times a day (BID) | ORAL | Status: DC
  Administered 2023-12-25 – 2023-12-27 (×5): 100 mg via ORAL
  Filled 2023-12-25 (×6): qty 2

## 2023-12-25 MED ORDER — NITROGLYCERIN 0.4 MG SL SUBL
0.4000 mg | SUBLINGUAL_TABLET | SUBLINGUAL | Status: DC | PRN
  Administered 2023-12-25: 0.4 mg via SUBLINGUAL
  Filled 2023-12-25 (×2): qty 1

## 2023-12-25 MED ORDER — ATORVASTATIN CALCIUM 20 MG PO TABS
20.0000 mg | ORAL_TABLET | Freq: Every day | ORAL | Status: DC
  Administered 2023-12-25 – 2023-12-27 (×3): 20 mg via ORAL
  Filled 2023-12-25 (×3): qty 1

## 2023-12-25 MED ORDER — FUROSEMIDE 20 MG PO TABS: 20.0000 mg | ORAL_TABLET | Freq: Every day | ORAL | Status: DC

## 2023-12-25 MED ORDER — DIPHENHYDRAMINE HCL 50 MG/ML IJ SOLN
12.5000 mg | Freq: Once | INTRAMUSCULAR | Status: AC
  Administered 2023-12-25: 12.5 mg via INTRAVENOUS
  Filled 2023-12-25: qty 1

## 2023-12-25 MED ORDER — CHLORHEXIDINE GLUCONATE CLOTH 2 % EX PADS
6.0000 | MEDICATED_PAD | Freq: Every day | CUTANEOUS | Status: DC
  Administered 2023-12-26 – 2023-12-27 (×2): 6 via TOPICAL

## 2023-12-25 MED ORDER — GABAPENTIN 100 MG PO CAPS
200.0000 mg | ORAL_CAPSULE | Freq: Every day | ORAL | Status: DC
  Administered 2023-12-25 – 2023-12-26 (×3): 200 mg via ORAL
  Filled 2023-12-25 (×3): qty 2

## 2023-12-25 NOTE — Plan of Care (Signed)
  Problem: Coping: Goal: Ability to adjust to condition or change in health will improve Outcome: Progressing   Problem: Metabolic: Goal: Ability to maintain appropriate glucose levels will improve Outcome: Progressing   Problem: Skin Integrity: Goal: Risk for impaired skin integrity will decrease Outcome: Progressing   Problem: Tissue Perfusion: Goal: Adequacy of tissue perfusion will improve Outcome: Progressing   

## 2023-12-25 NOTE — ED Notes (Signed)
 Pt reports improvement in chest pressure after nitroglycerin  tab, states pressure is not completely gone however. Erminio Cone, NP updated.

## 2023-12-25 NOTE — Progress Notes (Signed)
  Echocardiogram 2D Echocardiogram has been performed.  Koleen KANDICE Popper, RDCS 12/25/2023, 11:29 AM

## 2023-12-25 NOTE — Progress Notes (Signed)
 PROGRESS NOTE  Lawrence Jordan, is a 73 y.o. male, DOB - [birth year], FMW:[id number]  Admit date - 12/24/2023   Admitting Physician Posey Maier, DO  Outpatient Primary MD for the patient is Trudy Vaughn FALCON, MD  LOS - 1  Chief Complaint  Patient presents with   Shortness of Breath      Brief Narrative:  73 year old male with past medical history of hypertension, hyperlipidemia, T2DM, atrial flutter, persistent atrial fibrillation, SSS/tachybradycardia syndrome--pacemaker in situ, morbid obesity and OSA on CPAP admitted on 12/24/2023 with acute hypoxic respiratory failure in the setting of hypertensive emergency and flash pulmonary edema    -Assessment and Plan: 1)Hypertensive emergency with flash pulmonary edema--admission BP 225/118 mmhg -Echo from 12/25/2023 with EF of 55 to 60%, no aortic stenosis, no mitral stenosis, moderate LVH noted, no atrial enlargement -proBNP WNL - Improving with iv nitro drip and IV Lasix  - Continue Toprol  XL 100 mg twice daily, , Losartan  100 mg daily -Okay to try to wean off IV nitro drip slowly  2)Persistent A-fib/SSS/tachybradycardia syndrome/Pacemaker in situ-- -echo as above #1 --c/n amiodarone  20 mg daily and Toprol -XL 100 mg twice daily for rate control - Continue apixaban  for stroke prophylaxis  3)Acute hypoxic respiratory failure--- due to #1 above - Should improve with management as above #1 - Wean off O2 as able  4)DM2--- A1c 8.3 reflecting uncontrolled DM with hyperglycemia PTA -Hold PTA metformin  - Continue Semglee  90 units daily Use Novolog /Humalog  Sliding scale insulin  with Accu-Cheks/Fingersticks as ordered   5)Morbid Obesity/OSA -Low calorie diet, portion control and increase physical activity discussed with patient -Body mass index is 48.82 kg/m. -- Continue CPAP nightly  CRITICAL CARE Performed by: Rendall Carwin   Total critical care time: 54 minutes  Critical care time was exclusive of separately billable  procedures and treating other patients.  Critical care was necessary to treat or prevent imminent or life-threatening deterioration.  Hypertensive emergency with flash pulmonary edema and acute hypoxic respiratory failure--admission BP 225/118 mmhg  --Currently requiring iv nitro drip and IV Lasix  and supplemental oxygen  Critical care was time spent personally by me on the following activities: development of treatment plan with patient and/or surrogate as well as nursing, discussions with consultants, evaluation of patient's response to treatment, examination of patient, obtaining history from patient or surrogate, ordering and performing treatments and interventions, ordering and review of laboratory studies, ordering and review of radiographic studies, pulse oximetry and re-evaluation of patient's condition.   Status is: Inpatient   Disposition: The patient is from: Home              Anticipated d/c is to: Home              Anticipated d/c date is: 2 days              Patient currently is not medically stable to d/c. Barriers: Not Clinically Stable-   Code Status :  -  Code Status: Full Code   Family Communication:    NA (patient is alert, awake and coherent)   DVT Prophylaxis  :   - SCDs  SCDs Start: 12/25/23 0916 Place TED hose Start: 12/25/23 0916 apixaban  (ELIQUIS ) tablet 5 mg   Lab Results  Component Value Date   PLT 161 12/25/2023    Inpatient Medications  Scheduled Meds:  amiodarone   200 mg Oral Daily   apixaban   5 mg Oral BID   atorvastatin   20 mg Oral Daily   Chlorhexidine  Gluconate Cloth  6 each Topical  Daily   dorzolamide-timolol  1 drop Right Eye BID   furosemide   40 mg Intravenous Q12H   gabapentin   200 mg Oral QHS   insulin  aspart  0-20 Units Subcutaneous TID AC & HS   insulin  aspart  4 Units Subcutaneous TID WC   insulin  glargine-yfgn  90 Units Subcutaneous Daily   latanoprost   1 drop Both Eyes QHS   losartan   100 mg Oral Daily   metoprolol  succinate   100 mg Oral BID   multivitamin with minerals  1 tablet Oral Daily   Continuous Infusions:  nitroGLYCERIN  5 mcg/min (12/25/23 0610)   PRN Meds:.acetaminophen , nitroGLYCERIN    Anti-infectives (From admission, onward)    Start     Dose/Rate Route Frequency Ordered Stop   12/24/23 2300  cefTRIAXone (ROCEPHIN) 1 g in sodium chloride  0.9 % 100 mL IVPB        1 g 200 mL/hr over 30 Minutes Intravenous  Once 12/24/23 2250 12/25/23 0016   12/24/23 2300  azithromycin (ZITHROMAX) 500 mg in sodium chloride  0.9 % 250 mL IVPB  Status:  Discontinued        500 mg 250 mL/hr over 60 Minutes Intravenous Every 24 hours 12/24/23 2250 12/25/23 0142         Subjective: Lawrence Jordan today has no fevers, no emesis,  No further chest pain,    - Dyspnea and hypoxia improving - Voiding well   Objective: Vitals:   12/25/23 0900 12/25/23 1000 12/25/23 1100 12/25/23 1112  BP: 111/67 (!) 140/92 121/64   Pulse: 65 69 65   Resp:      Temp:    98.1 F (36.7 C)  TempSrc:    Axillary  SpO2: 96% 97% 98%   Weight:      Height:        Intake/Output Summary (Last 24 hours) at 12/25/2023 1452 Last data filed at 12/25/2023 1200 Gross per 24 hour  Intake 365.18 ml  Output 3525 ml  Net -3159.82 ml   Filed Weights   12/24/23 2131  Weight: (!) 158.8 kg    Physical Exam Gen:- Awake Alert, morbidly obese, no acute distress HEENT:- [place].AT, No sclera icterus Nose-  3L/min  Neck-Supple Neck, +ve JVD,.  Lungs-diminished breath sounds with faint rales CV- S1, S2 normal, regular , pacemaker in situ Abd-  +ve B.Sounds, Abd Soft, No tenderness, increased truncal adiposity Extremity/Skin:- +ve  edema, pedal pulses present  Psych-affect is appropriate, oriented x3 Neuro-no new focal deficits, no tremors  Data Reviewed: I have personally reviewed following labs and imaging studies  CBC: Recent Labs  Lab 12/24/23 2149 12/25/23 0333  WBC 9.5 8.0  NEUTROABS 7.1 5.5  HGB 14.6 13.3  HCT 44.1 40.0   MCV 96.7 96.2  PLT 168 161   Basic Metabolic Panel: Recent Labs  Lab 12/24/23 2149 12/25/23 0333  NA 136 140  K 4.5 3.8  CL 100 103  CO2 23 24  GLUCOSE 188* 121*  BUN 10 8  CREATININE 0.76 0.67  CALCIUM  9.1 8.8*  MG  --  1.9  PHOS  --  3.4   GFR: Estimated Creatinine Clearance: 126.4 mL/min (by C-G formula based on SCr of 0.67 mg/dL). Liver Function Tests: Recent Labs  Lab 12/24/23 2149 12/25/23 0333  AST 41 23  ALT 35 29  ALKPHOS 82 56  BILITOT 0.6 0.6  PROT 8.0 7.1  ALBUMIN 4.5 4.2   BNP (last 3 results) Recent Labs    12/24/23 2149  PROBNP 159.0  Recent Results (from the past 240 hours)  Resp panel by RT-PCR (RSV, Flu A&B, Covid) Anterior Nasal Swab     Status: None   Collection Time: 12/24/23 11:02 PM   Specimen: Anterior Nasal Swab  Result Value Ref Range Status   SARS Coronavirus 2 by RT PCR NEGATIVE NEGATIVE Final    Comment: (NOTE) SARS-CoV-2 target nucleic acids are NOT DETECTED.  The SARS-CoV-2 RNA is generally detectable in upper respiratory specimens during the acute phase of infection. The lowest concentration of SARS-CoV-2 viral copies this assay can detect is 138 copies/mL. A negative result does not preclude SARS-Cov-2 infection and should not be used as the sole basis for treatment or other patient management decisions. A negative result may occur with  improper specimen collection/handling, submission of specimen other than nasopharyngeal swab, presence of viral mutation(s) within the areas targeted by this assay, and inadequate number of viral copies(<138 copies/mL). A negative result must be combined with clinical observations, patient history, and epidemiological information. The expected result is Negative.  Fact Sheet for Patients:  bloggercourse.com  Fact Sheet for Healthcare Providers:  seriousbroker.it  This test is no t yet approved or cleared by the [place]  FDA and   has been authorized for detection and/or diagnosis of SARS-CoV-2 by FDA under an Emergency Use Authorization (EUA). This EUA will remain  in effect (meaning this test can be used) for the duration of the COVID-19 declaration under Section 564(b)(1) of the Act, 21 U.S.C.section 360bbb-3(b)(1), unless the authorization is terminated  or revoked sooner.       Influenza A by PCR NEGATIVE NEGATIVE Final   Influenza B by PCR NEGATIVE NEGATIVE Final    Comment: (NOTE) The Xpert Xpress SARS-CoV-2/FLU/RSV plus assay is intended as an aid in the diagnosis of influenza from Nasopharyngeal swab specimens and should not be used as a sole basis for treatment. Nasal washings and aspirates are unacceptable for Xpert Xpress SARS-CoV-2/FLU/RSV testing.  Fact Sheet for Patients: bloggercourse.com  Fact Sheet for Healthcare Providers: seriousbroker.it  This test is not yet approved or cleared by the [place]  FDA and has been authorized for detection and/or diagnosis of SARS-CoV-2 by FDA under an Emergency Use Authorization (EUA). This EUA will remain in effect (meaning this test can be used) for the duration of the COVID-19 declaration under Section 564(b)(1) of the Act, 21 U.S.C. section 360bbb-3(b)(1), unless the authorization is terminated or revoked.     Resp Syncytial Virus by PCR NEGATIVE NEGATIVE Final    Comment: (NOTE) Fact Sheet for Patients: bloggercourse.com  Fact Sheet for Healthcare Providers: seriousbroker.it  This test is not yet approved or cleared by the [place]  FDA and has been authorized for detection and/or diagnosis of SARS-CoV-2 by FDA under an Emergency Use Authorization (EUA). This EUA will remain in effect (meaning this test can be used) for the duration of the COVID-19 declaration under Section 564(b)(1) of the Act, 21 U.S.C. section 360bbb-3(b)(1),  unless the authorization is terminated or revoked.  Performed at [facility], [address] [place], [place] [address]   Blood culture (routine x 2)     Status: None (Preliminary result)   Collection Time: 12/24/23 11:03 PM   Specimen: BLOOD RIGHT ARM  Result Value Ref Range Status   Specimen Description BLOOD RIGHT ARM  Final   Special Requests BOTTLES DRAWN AEROBIC AND ANAEROBIC  Final   Culture   Final    NO GROWTH < 12 HOURS Performed at [facility], [address]  [place], [place] [address]    Report Status PENDING  Incomplete  Blood culture (routine x 2)     Status: None (Preliminary result)   Collection Time: 12/24/23 11:34 PM   Specimen: BLOOD RIGHT FOREARM  Result Value Ref Range Status   Specimen Description BLOOD RIGHT FOREARM  Final   Special Requests BOTTLES DRAWN AEROBIC AND ANAEROBIC  Final   Culture   Final    NO GROWTH < 12 HOURS Performed at [facility], [address], [place], [place] [address]    Report Status PENDING  Incomplete  MRSA Next Gen by PCR, Nasal     Status: None   Collection Time: 12/25/23  3:19 AM   Specimen: Nasal Mucosa; Nasal Swab  Result Value Ref Range Status   MRSA by PCR Next Gen NOT DETECTED NOT DETECTED Final    Comment: (NOTE) The GeneXpert MRSA Assay (FDA approved for NASAL specimens only), is one component of a comprehensive MRSA colonization surveillance program. It is not intended to diagnose MRSA infection nor to guide or monitor treatment for MRSA infections. Test performance is not FDA approved in patients less than 32 years old. Performed at [facility], [address] [place], [place] [address]     Radiology Studies: ECHOCARDIOGRAM COMPLETE Result Date: 12/25/2023    ECHOCARDIOGRAM REPORT   Patient Name:   Lawrence Jordan Date of Exam: 12/25/2023 Medical Rec #:  [id number]       Height:       71.0 in Accession #:    [id number]      Weight:       350.0 lb Date of Birth:  Jan 24, 1951      BSA:          2.675 m Patient  Age:    73 years        BP:           121/64 mmHg Patient Gender: M               HR:           65 bpm. Exam Location:  [facility] Procedure: 2D Echo, Cardiac Doppler and Color Doppler (Both Spectral and Color            Flow Doppler were utilized during procedure). Indications:    CHF- Acute Systolic I50.21  History:        Patient has prior history of Echocardiogram examinations, most                 recent 10/11/2019. CHF, CAD; Risk Factors:Sleep Apnea,                 Dyslipidemia and Diabetes.  Sonographer:    Koleen Popper RDCS Referring Phys: [id number] OLADAPO ADEFESO IMPRESSIONS  1. Left ventricular ejection fraction, by estimation, is 55 to 60%. The left ventricle has normal function. The left ventricle has no regional wall motion abnormalities. The left ventricular internal cavity size was mildly dilated. There is moderate concentric left ventricular hypertrophy. Left ventricular diastolic parameters are indeterminate.  2. Right ventricular systolic function is normal. The right ventricular size is normal. Tricuspid regurgitation signal is inadequate for assessing PA pressure.  3. The mitral valve is normal in structure. Trivial mitral valve regurgitation. No evidence of mitral stenosis.  4. The aortic valve is normal in structure. Aortic valve regurgitation is trivial. No aortic stenosis is present. Aortic regurgitation PHT measures 643 msec.  5. There is mild dilatation of the ascending aorta, measuring 42  mm.  6. The inferior vena cava is dilated in size with >50% respiratory variability, suggesting right atrial pressure of 8 mmHg. FINDINGS  Left Ventricle: Left ventricular ejection fraction, by estimation, is 55 to 60%. The left ventricle has normal function. The left ventricle has no regional wall motion abnormalities. The left ventricular internal cavity size was mildly dilated. There is  moderate concentric left ventricular hypertrophy. Left ventricular diastolic parameters are indeterminate. Right  Ventricle: The right ventricular size is normal. No increase in right ventricular wall thickness. Right ventricular systolic function is normal. Tricuspid regurgitation signal is inadequate for assessing PA pressure. Left Atrium: Left atrial size was normal in size. Right Atrium: Right atrial size was normal in size. Pericardium: There is no evidence of pericardial effusion. Mitral Valve: The mitral valve is normal in structure. Trivial mitral valve regurgitation. No evidence of mitral valve stenosis. Tricuspid Valve: The tricuspid valve is normal in structure. Tricuspid valve regurgitation is not demonstrated. No evidence of tricuspid stenosis. Aortic Valve: The aortic valve is normal in structure. Aortic valve regurgitation is trivial. Aortic regurgitation PHT measures 643 msec. No aortic stenosis is present. Pulmonic Valve: The pulmonic valve was normal in structure. Pulmonic valve regurgitation is trivial. No evidence of pulmonic stenosis. Aorta: The aortic root is normal in size and structure. There is mild dilatation of the ascending aorta, measuring 42 mm. Venous: The inferior vena cava is dilated in size with greater than 50% respiratory variability, suggesting right atrial pressure of 8 mmHg. IAS/Shunts: No atrial level shunt detected by color flow Doppler.  LEFT VENTRICLE PLAX 2D LVIDd:         5.90 cm   Diastology LVIDs:         3.70 cm   LV e' medial:    5.84 cm/s LV PW:         1.40 cm   LV E/e' medial:  17.5 LV IVS:        1.40 cm   LV e' lateral:   9.76 cm/s LVOT diam:     2.40 cm   LV E/e' lateral: 10.5 LV SV:         90 LV SV Index:   34 LVOT Area:     4.52 cm  RIGHT VENTRICLE             IVC RV S prime:     19.20 cm/s  IVC diam: 2.90 cm TAPSE (M-mode): 3.0 cm LEFT ATRIUM             Index LA diam:        5.60 cm 2.09 cm/m LA Vol (A2C):   95.1 ml 35.56 ml/m LA Vol (A4C):   65.5 ml 24.49 ml/m LA Biplane Vol: 79.6 ml 29.76 ml/m  AORTIC VALVE LVOT Vmax:   111.00 cm/s LVOT Vmean:  73.900 cm/s LVOT  VTI:    0.200 m AI PHT:      643 msec  AORTA Ao Root diam: 3.50 cm Ao Asc diam:  4.20 cm MITRAL VALVE MV Area (PHT): 3.37 cm     SHUNTS MV Decel Time: 225 msec     Systemic VTI:  0.20 m MR Peak grad: 69.9 mmHg     Systemic Diam: 2.40 cm MR Vmax:      418.00 cm/s MV E velocity: 102.00 cm/s MV A velocity: 35.20 cm/s MV E/A ratio:  2.90 Wilbert Bihari MD Electronically signed by Wilbert Bihari MD Signature Date/Time: 12/25/2023/11:39:17 AM    Final    DG  Chest Port 1 View Result Date: 12/24/2023 CLINICAL DATA:  Shortness of breath EXAM: PORTABLE CHEST 1 VIEW COMPARISON:  Chest x-ray 04/27/2023 FINDINGS: Heart is enlarged. There central pulmonary vascular congestion and central interstitial prominence bilaterally. There is no lung consolidation, pleural effusion or pneumothorax. Left-sided pacemaker is again seen. No acute fractures are identified. IMPRESSION: Cardiomegaly with central pulmonary vascular congestion and central interstitial prominence. Findings may represent mild pulmonary edema. Electronically Signed   By: Greig Pique M.D.   On: 12/24/2023 22:00   Scheduled Meds:  amiodarone   200 mg Oral Daily   apixaban   5 mg Oral BID   atorvastatin  20 mg Oral Daily   Chlorhexidine  Gluconate Cloth  6 each Topical Daily   dorzolamide-timolol  1 drop Right Eye BID   furosemide   40 mg Intravenous Q12H   gabapentin   200 mg Oral QHS   insulin  aspart  0-20 Units Subcutaneous TID AC & HS   insulin  aspart  4 Units Subcutaneous TID WC   insulin  glargine-yfgn  90 Units Subcutaneous Daily   latanoprost   1 drop Both Eyes QHS   losartan   100 mg Oral Daily   metoprolol  succinate  100 mg Oral BID   multivitamin with minerals  1 tablet Oral Daily   Continuous Infusions:  nitroGLYCERIN  5 mcg/min (12/25/23 0610)    LOS: 1 day   Rendall Carwin M.D on 12/25/2023 at 2:52 PM  Go to [email or web address] - for contact info  [facility] - Office  [phone]  If 7PM-7AM, please contact  night-coverage [email or web address] 12/25/2023, 2:52 PM

## 2023-12-25 NOTE — Progress Notes (Signed)
 Pt experienced a 30 second run of V tach then returning to NSR. Pt Alert and Oriented x4. Pt SOB Sp02% 98, tachypneic RR 29, BP 150/84, HR 99. Respiratory at bedside placed pt back on BiPAP when previously on 4L [place]. NP Jesus came to bedside. EKG ordered and completed. Nitroglycerin  drip started at 5 mcg/min. Pt reported decrease in SOB and chest pressure after nitroglycerin  drip and BiPAP applied.

## 2023-12-25 NOTE — ED Notes (Signed)
 Pt reports having left sided chest pressure starting a few minutes ago, denies pain. Erminio Cone, NP messaged in secure chat. Repeat EKG obtained.

## 2023-12-25 NOTE — Significant Event (Signed)
       CROSS COVER NOTE  NAME: Lawrence Jordan MRN: [id number] DOB : [birth year] ATTENDING PHYSICIAN: Adefeso, Oladapo, DO    Date of Service   12/25/2023   HPI/Events of Note   [facility] Cross Cover at [facility] See admission  Interventions   Assessment/Plan: Vtach on tele accomp by left chest pressure Strip saved in chart EKG SR PACs    12/25/2023    6:00 AM 12/25/2023    5:00 AM 12/25/2023    4:45 AM  Vitals with BMI  Systolic 118 126 873  Diastolic 59 74 65  Pulse 65 65 69   Troponin flat 23-22-20  Mag 1.9 Restart nitroglycerin  drip -titrate for chest discomfort 2 gm mag Monitor on tele F/u cards consult and echo       Erminio LITTIE Cone NP [facility] Cross Cover 7pm-7am - check amion for availability Pager [phone]

## 2023-12-25 NOTE — ED Notes (Signed)
 Holding additional nitroglycerin  SL tab at this time d/t current BP per Erminio Cone, NP.

## 2023-12-25 NOTE — Progress Notes (Signed)
   12/25/23 1233  TOC Brief Assessment  Insurance and Status Reviewed  Patient has primary care physician Yes  Home environment has been reviewed Home with spouse  Prior level of function: independent  Prior/Current Home Services No current home services  Social Drivers of Health Review SDOH reviewed no interventions necessary  Readmission risk has been reviewed Yes  Transition of care needs no transition of care needs at this time   Inpatient Care Manager (ICM) has reviewed patient and no ICM needs have been identified at this time. We will continue to monitor patient advancement through interdisciplinary progression rounds. If new patient transition needs arise, please place a ICM consult.

## 2023-12-26 DIAGNOSIS — J9621 Acute and chronic respiratory failure with hypoxia: Secondary | ICD-10-CM | POA: Diagnosis not present

## 2023-12-26 LAB — CBC
HCT: 38.5 % — ABNORMAL LOW (ref 39.0–52.0)
Hemoglobin: 12.8 g/dL — ABNORMAL LOW (ref 13.0–17.0)
MCH: 32.1 pg (ref 26.0–34.0)
MCHC: 33.2 g/dL (ref 30.0–36.0)
MCV: 96.5 fL (ref 80.0–100.0)
Platelets: 152 K/uL (ref 150–400)
RBC: 3.99 MIL/uL — ABNORMAL LOW (ref 4.22–5.81)
RDW: 13.5 % (ref 11.5–15.5)
WBC: 6.9 K/uL (ref 4.0–10.5)
nRBC: 0 % (ref 0.0–0.2)

## 2023-12-26 LAB — COMPREHENSIVE METABOLIC PANEL WITH GFR
ALT: 23 U/L (ref 0–44)
AST: 22 U/L (ref 15–41)
Albumin: 4 g/dL (ref 3.5–5.0)
Alkaline Phosphatase: 43 U/L (ref 38–126)
Anion gap: 11 (ref 5–15)
BUN: 14 mg/dL (ref 8–23)
CO2: 27 mmol/L (ref 22–32)
Calcium: 8.6 mg/dL — ABNORMAL LOW (ref 8.9–10.3)
Chloride: 103 mmol/L (ref 98–111)
Creatinine, Ser: 0.66 mg/dL (ref 0.61–1.24)
GFR, Estimated: >60 mL/min (ref >60)
Glucose, Bld: 84 mg/dL (ref 70–99)
Potassium: 3.2 mmol/L — ABNORMAL LOW (ref 3.5–5.1)
Sodium: 140 mmol/L (ref 135–145)
Total Bilirubin: 0.8 mg/dL (ref 0.0–1.2)
Total Protein: 6.8 g/dL (ref 6.5–8.1)

## 2023-12-26 LAB — HEMOGLOBIN A1C
Hgb A1c MFr Bld: 7.7 % — ABNORMAL HIGH (ref 4.8–5.6)
Mean Plasma Glucose: 174 mg/dL

## 2023-12-26 LAB — GLUCOSE, CAPILLARY
Glucose-Capillary: 93 mg/dL (ref 70–99)
Glucose-Capillary: 110 mg/dL — ABNORMAL HIGH (ref 70–99)
Glucose-Capillary: 104 mg/dL — ABNORMAL HIGH (ref 70–99)
Glucose-Capillary: 150 mg/dL — ABNORMAL HIGH (ref 70–99)

## 2023-12-26 LAB — MAGNESIUM: Magnesium: 2 mg/dL (ref 1.7–2.4)

## 2023-12-26 MED ORDER — HYDRALAZINE HCL 20 MG/ML IJ SOLN: 10.0000 mg | Freq: Four times a day (QID) | INTRAMUSCULAR | Status: DC | PRN

## 2023-12-26 MED ORDER — POTASSIUM CHLORIDE CRYS ER 20 MEQ PO TBCR
40.0000 meq | EXTENDED_RELEASE_TABLET | Freq: Once | ORAL | Status: AC
  Administered 2023-12-26: 40 meq via ORAL
  Filled 2023-12-26: qty 2

## 2023-12-26 NOTE — Progress Notes (Signed)
 PROGRESS NOTE  Lawrence Jordan, is a 73 y.o. male, DOB - 1950/03/25, FMW:[id number]  Admit date - 12/24/2023   Admitting Physician Posey Maier, DO  Outpatient Primary MD for the patient is Trudy Vaughn FALCON, MD  LOS - 2  Chief Complaint  Patient presents with   Shortness of Breath      Brief Narrative:  73 year old male with past medical history of hypertension, hyperlipidemia, T2DM, atrial flutter, persistent atrial fibrillation, SSS/tachybradycardia syndrome--pacemaker in situ, morbid obesity and OSA on CPAP admitted on 12/24/2023 with acute hypoxic respiratory failure in the setting of hypertensive emergency and flash pulmonary edema    -Assessment and Plan: 1)Hypertensive emergency with flash pulmonary edema--admission BP 225/118 mmhg -Echo from 12/25/2023 with EF of 55 to 60%, no aortic stenosis, no mitral stenosis, moderate LVH noted, no atrial enlargement -proBNP WNL -Weaned off IV nitro drip late on 12/25/2023 - Continue Toprol  XL 100 mg twice daily, , Losartan  100 mg daily - Continue IV Lasix  40 mg every 12h - May use IV hydralazine as needed elevated BP - Strict I's and O's and daily weights  2)Persistent A-fib/SSS/tachybradycardia syndrome/Pacemaker in situ-- -echo as above #1 --c/n amiodarone  200 mg once daily and Toprol -XL 100 mg twice daily for rate control - Continue apixaban  for stroke prophylaxis  3)Acute hypoxic respiratory failure--- due to #1 above - Should improve with management as above #1 - Wean off O2 as able  4)DM2--- A1c 8.3 reflecting uncontrolled DM with hyperglycemia PTA -Hold PTA metformin  - Continue Semglee  90 units daily Use Novolog /Humalog  Sliding scale insulin  with Accu-Cheks/Fingersticks as ordered   5)Morbid Obesity/OSA -Low calorie diet, portion control and increase physical activity discussed with patient -Body mass index is 48.82 kg/m. -- Continue CPAP nightly  Status is: Inpatient   Disposition: The patient is from: Home               Anticipated d/c is to: Home              Anticipated d/c date is: 1 day              Patient currently is not medically stable to d/c. Barriers: Not Clinically Stable-   Code Status :  -  Code Status: Full Code   Family Communication:    NA (patient is alert, awake and coherent)   DVT Prophylaxis  :   - SCDs  SCDs Start: 12/25/23 0916 Place TED hose Start: 12/25/23 0916 apixaban  (ELIQUIS ) tablet 5 mg   Lab Results  Component Value Date   PLT 152 12/26/2023   Inpatient Medications  Scheduled Meds:  amiodarone   200 mg Oral Daily   apixaban   5 mg Oral BID   atorvastatin  20 mg Oral Daily   Chlorhexidine  Gluconate Cloth  6 each Topical Daily   dorzolamide-timolol  1 drop Right Eye BID   furosemide   40 mg Intravenous Q12H   gabapentin   200 mg Oral QHS   insulin  aspart  0-20 Units Subcutaneous TID AC & HS   insulin  aspart  4 Units Subcutaneous TID WC   insulin  glargine-yfgn  90 Units Subcutaneous Daily   latanoprost   1 drop Both Eyes QHS   losartan   100 mg Oral Daily   metoprolol  succinate  100 mg Oral BID   multivitamin with minerals  1 tablet Oral Daily   Continuous Infusions:  nitroGLYCERIN  Stopped (12/25/23 1740)   PRN Meds:.acetaminophen , nitroGLYCERIN    Anti-infectives (From admission, onward)    Start     Dose/Rate Route Frequency  Ordered Stop   12/24/23 2300  cefTRIAXone (ROCEPHIN) 1 g in sodium chloride  0.9 % 100 mL IVPB        1 g 200 mL/hr over 30 Minutes Intravenous  Once 12/24/23 2250 12/25/23 0016   12/24/23 2300  azithromycin (ZITHROMAX) 500 mg in sodium chloride  0.9 % 250 mL IVPB  Status:  Discontinued        500 mg 250 mL/hr over 60 Minutes Intravenous Every 24 hours 12/24/23 2250 12/25/23 0142         Subjective: Lawrence Jordan today has no fevers, no emesis,  No further chest pain,    - Dyspnea and hypoxia improving - Voiding well   Objective: Vitals:   12/26/23 1300 12/26/23 1400 12/26/23 1500 12/26/23 1600  BP: 121/77 (!)  141/77 (!) 149/53 (!) 173/93  Pulse: 71 71 77 66  Resp: (!) 21 (!) 22 (!) 22 12  Temp:      TempSrc:      SpO2: 94% 95% 99% 98%  Weight:      Height:        Intake/Output Summary (Last 24 hours) at 12/26/2023 1657 Last data filed at 12/26/2023 0959 Gross per 24 hour  Intake 241.51 ml  Output 1650 ml  Net -1408.49 ml   Filed Weights   12/24/23 2131  Weight: (!) 158.8 kg    Physical Exam Gen:- Awake Alert, morbidly obese, no acute distress HEENT:- [place].AT, No sclera icterus Neck-Supple Neck, +ve JVD,.  Lungs-improving air movement, no wheezing CV- S1, S2 normal, regular , pacemaker in situ Abd-  +ve B.Sounds, Abd Soft, No tenderness, increased truncal adiposity Extremity/Skin:-Improving edema, pedal pulses present  Psych-affect is appropriate, oriented x3 Neuro-no new focal deficits, no tremors  Data Reviewed: I have personally reviewed following labs and imaging studies  CBC: Recent Labs  Lab 12/24/23 2149 12/25/23 0333 12/26/23 0521  WBC 9.5 8.0 6.9  NEUTROABS 7.1 5.5  --   HGB 14.6 13.3 12.8*  HCT 44.1 40.0 38.5*  MCV 96.7 96.2 96.5  PLT 168 161 152   Basic Metabolic Panel: Recent Labs  Lab 12/24/23 2149 12/25/23 0333 12/26/23 0521  NA 136 140 140  K 4.5 3.8 3.2*  CL 100 103 103  CO2 23 24 27   GLUCOSE 188* 121* 84  BUN 10 8 14   CREATININE 0.76 0.67 0.66  CALCIUM  9.1 8.8* 8.6*  MG  --  1.9 2.0  PHOS  --  3.4  --    GFR: Estimated Creatinine Clearance: 126.4 mL/min (by C-G formula based on SCr of 0.66 mg/dL). Liver Function Tests: Recent Labs  Lab 12/24/23 2149 12/25/23 0333 12/26/23 0521  AST 41 23 22  ALT 35 29 23  ALKPHOS 82 56 43  BILITOT 0.6 0.6 0.8  PROT 8.0 7.1 6.8  ALBUMIN 4.5 4.2 4.0   BNP (last 3 results) Recent Labs    12/24/23 2149  PROBNP 159.0   Recent Results (from the past 240 hours)  Resp panel by RT-PCR (RSV, Flu A&B, Covid) Anterior Nasal Swab     Status: None   Collection Time: 12/24/23 11:02 PM   Specimen:  Anterior Nasal Swab  Result Value Ref Range Status   SARS Coronavirus 2 by RT PCR NEGATIVE NEGATIVE Final    Comment: (NOTE) SARS-CoV-2 target nucleic acids are NOT DETECTED.  The SARS-CoV-2 RNA is generally detectable in upper respiratory specimens during the acute phase of infection. The lowest concentration of SARS-CoV-2 viral copies this assay can detect is 138 copies/mL.  A negative result does not preclude SARS-Cov-2 infection and should not be used as the sole basis for treatment or other patient management decisions. A negative result may occur with  improper specimen collection/handling, submission of specimen other than nasopharyngeal swab, presence of viral mutation(s) within the areas targeted by this assay, and inadequate number of viral copies(<138 copies/mL). A negative result must be combined with clinical observations, patient history, and epidemiological information. The expected result is Negative.  Fact Sheet for Patients:  bloggercourse.com  Fact Sheet for Healthcare Providers:  seriousbroker.it  This test is no t yet approved or cleared by the [place]  FDA and  has been authorized for detection and/or diagnosis of SARS-CoV-2 by FDA under an Emergency Use Authorization (EUA). This EUA will remain  in effect (meaning this test can be used) for the duration of the COVID-19 declaration under Section 564(b)(1) of the Act, 21 U.S.C.section 360bbb-3(b)(1), unless the authorization is terminated  or revoked sooner.       Influenza A by PCR NEGATIVE NEGATIVE Final   Influenza B by PCR NEGATIVE NEGATIVE Final    Comment: (NOTE) The Xpert Xpress SARS-CoV-2/FLU/RSV plus assay is intended as an aid in the diagnosis of influenza from Nasopharyngeal swab specimens and should not be used as a sole basis for treatment. Nasal washings and aspirates are unacceptable for Xpert Xpress SARS-CoV-2/FLU/RSV testing.  Fact  Sheet for Patients: bloggercourse.com  Fact Sheet for Healthcare Providers: seriousbroker.it  This test is not yet approved or cleared by the [place]  FDA and has been authorized for detection and/or diagnosis of SARS-CoV-2 by FDA under an Emergency Use Authorization (EUA). This EUA will remain in effect (meaning this test can be used) for the duration of the COVID-19 declaration under Section 564(b)(1) of the Act, 21 U.S.C. section 360bbb-3(b)(1), unless the authorization is terminated or revoked.     Resp Syncytial Virus by PCR NEGATIVE NEGATIVE Final    Comment: (NOTE) Fact Sheet for Patients: bloggercourse.com  Fact Sheet for Healthcare Providers: seriousbroker.it  This test is not yet approved or cleared by the [place]  FDA and has been authorized for detection and/or diagnosis of SARS-CoV-2 by FDA under an Emergency Use Authorization (EUA). This EUA will remain in effect (meaning this test can be used) for the duration of the COVID-19 declaration under Section 564(b)(1) of the Act, 21 U.S.C. section 360bbb-3(b)(1), unless the authorization is terminated or revoked.  Performed at [facility], [address] [place], [place] [address]   Blood culture (routine x 2)     Status: None (Preliminary result)   Collection Time: 12/24/23 11:03 PM   Specimen: BLOOD RIGHT ARM  Result Value Ref Range Status   Specimen Description BLOOD RIGHT ARM  Final   Special Requests BOTTLES DRAWN AEROBIC AND ANAEROBIC  Final   Culture   Final    NO GROWTH 2 DAYS Performed at [facility], [address], [place], [place] [address]    Report Status PENDING  Incomplete  Blood culture (routine x 2)     Status: None (Preliminary result)   Collection Time: 12/24/23 11:34 PM   Specimen: BLOOD RIGHT FOREARM  Result Value Ref Range Status   Specimen Description BLOOD RIGHT FOREARM  Final    Special Requests BOTTLES DRAWN AEROBIC AND ANAEROBIC  Final   Culture   Final    NO GROWTH 2 DAYS Performed at [facility] [place], [address], [place], [place] [address]    Report Status PENDING  Incomplete  MRSA Next  Gen by PCR, Nasal     Status: None   Collection Time: 12/25/23  3:19 AM   Specimen: Nasal Mucosa; Nasal Swab  Result Value Ref Range Status   MRSA by PCR Next Gen NOT DETECTED NOT DETECTED Final    Comment: (NOTE) The GeneXpert MRSA Assay (FDA approved for NASAL specimens only), is one component of a comprehensive MRSA colonization surveillance program. It is not intended to diagnose MRSA infection nor to guide or monitor treatment for MRSA infections. Test performance is not FDA approved in patients less than 74 years old. Performed at [facility], [address] [place], [place] [address]     Radiology Studies: ECHOCARDIOGRAM COMPLETE Result Date: 12/25/2023    ECHOCARDIOGRAM REPORT   Patient Name:   Lawrence Jordan Date of Exam: 12/25/2023 Medical Rec #:  [id number]       Height:       71.0 in Accession #:    [id number]      Weight:       350.0 lb Date of Birth:  13-Sep-1950      BSA:          2.675 m Patient Age:    73 years        BP:           121/64 mmHg Patient Gender: M               HR:           65 bpm. Exam Location:  [facility] Procedure: 2D Echo, Cardiac Doppler and Color Doppler (Both Spectral and Color            Flow Doppler were utilized during procedure). Indications:    CHF- Acute Systolic I50.21  History:        Patient has prior history of Echocardiogram examinations, most                 recent 10/11/2019. CHF, CAD; Risk Factors:Sleep Apnea,                 Dyslipidemia and Diabetes.  Sonographer:    Koleen Popper RDCS Referring Phys: [id number] OLADAPO ADEFESO IMPRESSIONS  1. Left ventricular ejection fraction, by estimation, is 55 to 60%. The left ventricle has normal function. The left ventricle has no regional wall motion abnormalities. The left ventricular  internal cavity size was mildly dilated. There is moderate concentric left ventricular hypertrophy. Left ventricular diastolic parameters are indeterminate.  2. Right ventricular systolic function is normal. The right ventricular size is normal. Tricuspid regurgitation signal is inadequate for assessing PA pressure.  3. The mitral valve is normal in structure. Trivial mitral valve regurgitation. No evidence of mitral stenosis.  4. The aortic valve is normal in structure. Aortic valve regurgitation is trivial. No aortic stenosis is present. Aortic regurgitation PHT measures 643 msec.  5. There is mild dilatation of the ascending aorta, measuring 42 mm.  6. The inferior vena cava is dilated in size with >50% respiratory variability, suggesting right atrial pressure of 8 mmHg. FINDINGS  Left Ventricle: Left ventricular ejection fraction, by estimation, is 55 to 60%. The left ventricle has normal function. The left ventricle has no regional wall motion abnormalities. The left ventricular internal cavity size was mildly dilated. There is  moderate concentric left ventricular hypertrophy. Left ventricular diastolic parameters are indeterminate. Right Ventricle: The right ventricular size is normal. No increase in right ventricular wall thickness. Right ventricular systolic function is normal. Tricuspid regurgitation signal is inadequate  for assessing PA pressure. Left Atrium: Left atrial size was normal in size. Right Atrium: Right atrial size was normal in size. Pericardium: There is no evidence of pericardial effusion. Mitral Valve: The mitral valve is normal in structure. Trivial mitral valve regurgitation. No evidence of mitral valve stenosis. Tricuspid Valve: The tricuspid valve is normal in structure. Tricuspid valve regurgitation is not demonstrated. No evidence of tricuspid stenosis. Aortic Valve: The aortic valve is normal in structure. Aortic valve regurgitation is trivial. Aortic regurgitation PHT measures 643  msec. No aortic stenosis is present. Pulmonic Valve: The pulmonic valve was normal in structure. Pulmonic valve regurgitation is trivial. No evidence of pulmonic stenosis. Aorta: The aortic root is normal in size and structure. There is mild dilatation of the ascending aorta, measuring 42 mm. Venous: The inferior vena cava is dilated in size with greater than 50% respiratory variability, suggesting right atrial pressure of 8 mmHg. IAS/Shunts: No atrial level shunt detected by color flow Doppler.  LEFT VENTRICLE PLAX 2D LVIDd:         5.90 cm   Diastology LVIDs:         3.70 cm   LV e' medial:    5.84 cm/s LV PW:         1.40 cm   LV E/e' medial:  17.5 LV IVS:        1.40 cm   LV e' lateral:   9.76 cm/s LVOT diam:     2.40 cm   LV E/e' lateral: 10.5 LV SV:         90 LV SV Index:   34 LVOT Area:     4.52 cm  RIGHT VENTRICLE             IVC RV S prime:     19.20 cm/s  IVC diam: 2.90 cm TAPSE (M-mode): 3.0 cm LEFT ATRIUM             Index LA diam:        5.60 cm 2.09 cm/m LA Vol (A2C):   95.1 ml 35.56 ml/m LA Vol (A4C):   65.5 ml 24.49 ml/m LA Biplane Vol: 79.6 ml 29.76 ml/m  AORTIC VALVE LVOT Vmax:   111.00 cm/s LVOT Vmean:  73.900 cm/s LVOT VTI:    0.200 m AI PHT:      643 msec  AORTA Ao Root diam: 3.50 cm Ao Asc diam:  4.20 cm MITRAL VALVE MV Area (PHT): 3.37 cm     SHUNTS MV Decel Time: 225 msec     Systemic VTI:  0.20 m MR Peak grad: 69.9 mmHg     Systemic Diam: 2.40 cm MR Vmax:      418.00 cm/s MV E velocity: 102.00 cm/s MV A velocity: 35.20 cm/s MV E/A ratio:  2.90 Wilbert Bihari MD Electronically signed by Wilbert Bihari MD Signature Date/Time: 12/25/2023/11:39:17 AM    Final    DG Chest Port 1 View Result Date: 12/24/2023 CLINICAL DATA:  Shortness of breath EXAM: PORTABLE CHEST 1 VIEW COMPARISON:  Chest x-ray 04/27/2023 FINDINGS: Heart is enlarged. There central pulmonary vascular congestion and central interstitial prominence bilaterally. There is no lung consolidation, pleural effusion or pneumothorax.  Left-sided pacemaker is again seen. No acute fractures are identified. IMPRESSION: Cardiomegaly with central pulmonary vascular congestion and central interstitial prominence. Findings may represent mild pulmonary edema. Electronically Signed   By: Greig Pique M.D.   On: 12/24/2023 22:00   Scheduled Meds:  amiodarone   200 mg Oral Daily  apixaban   5 mg Oral BID   atorvastatin  20 mg Oral Daily   Chlorhexidine  Gluconate Cloth  6 each Topical Daily   dorzolamide-timolol  1 drop Right Eye BID   furosemide   40 mg Intravenous Q12H   gabapentin   200 mg Oral QHS   insulin  aspart  0-20 Units Subcutaneous TID AC & HS   insulin  aspart  4 Units Subcutaneous TID WC   insulin  glargine-yfgn  90 Units Subcutaneous Daily   latanoprost   1 drop Both Eyes QHS   losartan   100 mg Oral Daily   metoprolol  succinate  100 mg Oral BID   multivitamin with minerals  1 tablet Oral Daily   Continuous Infusions:  nitroGLYCERIN  Stopped (12/25/23 1740)    LOS: 2 days   Rendall Carwin M.D on 12/26/2023 at 4:57 PM  Go to [email or web address] - for contact info  [facility] - Office  [phone]  If 7PM-7AM, please contact night-coverage [email or web address] 12/26/2023, 4:57 PM

## 2023-12-26 NOTE — Plan of Care (Signed)
  Problem: Education: Goal: Ability to describe self-care measures that may prevent or decrease complications (Diabetes Survival Skills Education) will improve Outcome: Progressing Goal: Individualized Educational Video(s) Outcome: Progressing   Problem: Coping: Goal: Ability to adjust to condition or change in health will improve Outcome: Progressing   Problem: Fluid Volume: Goal: Ability to maintain a balanced intake and output will improve Outcome: Progressing   Problem: Health Behavior/Discharge Planning: Goal: Ability to identify and utilize available resources and services will improve Outcome: Progressing Goal: Ability to manage health-related needs will improve Outcome: Progressing   Problem: Metabolic: Goal: Ability to maintain appropriate glucose levels will improve Outcome: Progressing   Problem: Nutritional: Goal: Maintenance of adequate nutrition will improve Outcome: Progressing Goal: Progress toward achieving an optimal weight will improve Outcome: Progressing   Problem: Skin Integrity: Goal: Risk for impaired skin integrity will decrease Outcome: Progressing   Problem: Tissue Perfusion: Goal: Adequacy of tissue perfusion will improve Outcome: Progressing   Problem: Education: Goal: Knowledge of General Education information will improve Description: Including pain rating scale, medication(s)/side effects and non-pharmacologic comfort measures Outcome: Progressing   Problem: Health Behavior/Discharge Planning: Goal: Ability to manage health-related needs will improve Outcome: Progressing   Problem: Clinical Measurements: Goal: Ability to maintain clinical measurements within normal limits will improve Outcome: Progressing Goal: Will remain free from infection Outcome: Progressing

## 2023-12-26 NOTE — Telephone Encounter (Signed)
 Spoke with patient in regards to medication change at last visit with Dr. [name]:   Will increase metoprolol  to 100mg  PO BID (will give additional 50mg  tabs) Decrease amiodarone  back to 200mg  daily  Pt states he must have gotten them confused and taken Amiodarone  twice daily. Medication changes sent to pt My Chart as well. Will send to Dr. Mealor for awareness. Pt verbalizes understanding and opportunities given for questions.

## 2023-12-27 ENCOUNTER — Encounter: Payer: Self-pay | Admitting: Internal Medicine

## 2023-12-27 LAB — RENAL FUNCTION PANEL
Albumin: 4.1 g/dL (ref 3.5–5.0)
Anion gap: 11 (ref 5–15)
BUN: 16 mg/dL (ref 8–23)
CO2: 27 mmol/L (ref 22–32)
Calcium: 8.9 mg/dL (ref 8.9–10.3)
Chloride: 102 mmol/L (ref 98–111)
Creatinine, Ser: 0.73 mg/dL (ref 0.61–1.24)
GFR, Estimated: >60 mL/min (ref >60)
Glucose, Bld: 74 mg/dL (ref 70–99)
Phosphorus: 3.9 mg/dL (ref 2.5–4.6)
Potassium: 3.3 mmol/L — ABNORMAL LOW (ref 3.5–5.1)
Sodium: 140 mmol/L (ref 135–145)

## 2023-12-27 LAB — GLUCOSE, CAPILLARY
Glucose-Capillary: 90 mg/dL (ref 70–99)
Glucose-Capillary: 206 mg/dL — ABNORMAL HIGH (ref 70–99)

## 2023-12-27 MED ORDER — POTASSIUM CHLORIDE CRYS ER 20 MEQ PO TBCR
40.0000 meq | EXTENDED_RELEASE_TABLET | ORAL | Status: AC
  Administered 2023-12-27 (×2): 40 meq via ORAL
  Filled 2023-12-27 (×2): qty 2

## 2023-12-27 MED ORDER — AMIODARONE HCL 200 MG PO TABS: 200.0000 mg | ORAL_TABLET | Freq: Every day | ORAL | Status: DC

## 2023-12-27 MED ORDER — FUROSEMIDE 20 MG PO TABS: 40.0000 mg | ORAL_TABLET | Freq: Every day | ORAL | 3 refills | Status: AC

## 2023-12-27 MED ORDER — LOSARTAN POTASSIUM 100 MG PO TABS: 100.0000 mg | ORAL_TABLET | Freq: Every day | ORAL | 5 refills | Status: AC

## 2023-12-27 NOTE — Discharge Summary (Signed)
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 Nasal washings and aspirates are unacceptable for Xpert Xpress SARS-CoV-2/FLU/RSV testing.  Fact Sheet for Patients: bloggercourse.com  Fact Sheet for Healthcare Providers: seriousbroker.it  This test is not yet approved or cleared by the [place]  FDA and has been authorized for detection and/or diagnosis of SARS-CoV-2 by FDA under an Emergency Use Authorization (EUA). This EUA will remain in effect (meaning this test can be used) for the duration of the COVID-19 declaration under Section 564(b)(1) of the Act, 21 U.S.C. section 360bbb-3(b)(1), unless the authorization is terminated or revoked.     Resp Syncytial Virus by PCR NEGATIVE NEGATIVE Final    Comment: (NOTE) Fact Sheet for Patients: bloggercourse.com  Fact Sheet for Healthcare Providers: seriousbroker.it  This test is not yet approved or cleared by the [place]  FDA and has been authorized for detection and/or diagnosis of SARS-CoV-2 by FDA under an Emergency Use  Authorization (EUA). This EUA will remain in effect (meaning this test can be used) for the duration of the COVID-19 declaration under Section 564(b)(1) of the Act, 21 U.S.C. section 360bbb-3(b)(1), unless the authorization is terminated or revoked.  Performed at [facility], [address] [place], [place] [address]   Blood culture (routine x 2)     Status: None (Preliminary result)   Collection Time: 12/24/23 11:03 PM   Specimen: BLOOD RIGHT ARM  Result Value Ref Range Status   Specimen Description BLOOD RIGHT ARM  Final   Special Requests BOTTLES DRAWN AEROBIC AND ANAEROBIC  Final   Culture   Final    NO GROWTH 3 DAYS Performed at [facility], [address], [place], [place] [address]    Report Status PENDING  Incomplete  Blood culture (routine x 2)     Status: None (Preliminary result)   Collection Time: 12/24/23 11:34 PM   Specimen: BLOOD RIGHT FOREARM  Result Value Ref Range Status   Specimen Description BLOOD RIGHT FOREARM  Final   Special Requests BOTTLES DRAWN AEROBIC AND ANAEROBIC  Final   Culture   Final    NO GROWTH 3 DAYS Performed at [facility], [address], [place], [place] [address]    Report Status PENDING  Incomplete  MRSA Next Gen by PCR, Nasal     Status: None   Collection Time: 12/25/23  3:19 AM   Specimen: Nasal Mucosa; Nasal Swab  Result Value Ref Range Status   MRSA by PCR Next Gen NOT DETECTED NOT DETECTED Final    Comment: (NOTE) The GeneXpert MRSA Assay (FDA approved for NASAL specimens only), is one component of a comprehensive MRSA colonization surveillance  program. It is not intended to diagnose MRSA infection nor to guide or monitor treatment for MRSA infections. Test performance is not FDA approved in patients less than 72 years old. Performed at [facility], [address], [place], [place] [address]     Today   Subjective    Gabreal Worton today has no new complaints - No chest pains, no significant dyspnea --patient ambulated  in hallways O2 sats 92 to 95% with and post ambulation on room air          Patient has been seen and examined prior to discharge   Objective   Blood pressure 132/87, pulse 70, temperature 98.7 F (37.1 C), temperature source Oral, resp. rate 20, height 5' 11 (1.803 m), weight (!) 152 kg, SpO2 96%.   Intake/Output Summary (Last 24 hours) at 12/27/2023 2010 Last data filed at 12/27/2023 1044 Gross per 24 hour  Intake 280 ml  Output 1750 ml  Net -1470 ml    Exam Gen:- Awake Alert, no acute distress, speaking in complete sentences HEENT:- [place].AT, No sclera icterus Neck-Supple Neck,No JVD,.  Lungs-  CTAB , improved air movement bilaterally CV- S1, S2 normal, regular, pacemaker in situ Abd-  +ve B.Sounds, Abd Soft, No tenderness, increased adiposity noted    Extremity/Skin:-Much improved edema,   good pulses Psych-affect is appropriate, oriented x3 Neuro-no new focal deficits, no tremors    Data Review   CBC w Diff:  Lab Results  Component Value Date   WBC 6.9 12/26/2023   HGB 12.8 (L) 12/26/2023   HGB 15.1 09/09/2023   HCT 38.5 (L) 12/26/2023   HCT 44.9 09/09/2023   PLT 152 12/26/2023   PLT 218 09/09/2023   LYMPHOPCT 20 12/25/2023   MONOPCT 10 12/25/2023   EOSPCT 1 12/25/2023   BASOPCT 0 12/25/2023   CMP:  Lab Results  Component Value Date   NA 140 12/27/2023   NA 135 09/09/2023   K 3.3 (L) 12/27/2023   CL 102 12/27/2023   CO2 27 12/27/2023   BUN 16 12/27/2023   BUN 15 09/09/2023   CREATININE 0.73 12/27/2023   CREATININE 0.81 11/25/2021   GLU 192 (H) 05/18/2023   PROT 6.8 12/26/2023   PROT 7.3 09/09/2023   ALBUMIN 4.1 12/27/2023   ALBUMIN 4.4 09/09/2023   BILITOT 0.8 12/26/2023   BILITOT 0.4 09/09/2023   BILITOT 0.3 05/14/2021   ALKPHOS 43 12/26/2023   AST 22 12/26/2023   AST 15 05/14/2021   ALT 23 12/26/2023   ALT 13 05/14/2021   Total Discharge time is about 33 minutes  Rendall Carwin M.D on 12/27/2023 at 8:10 PM  Go to [email or web address] -  for  contact info  [facility] - Office  [phone]

## 2023-12-27 NOTE — Care Management Important Message (Signed)
 Important Message  Patient Details  Name: Lawrence Jordan MRN: [id number] Date of Birth: Jan 27, 1950   Important Message Given:  N/A - LOS <3 / Initial given by admissions     Duwaine LITTIE Ada 12/27/2023, 2:00 PM

## 2023-12-27 NOTE — Discharge Instructions (Signed)
 1)Very Low-salt diet advised---Less than 2 gm of Sodium per day advised----ok to use Mrs DASH salt substitute instead of Salt 2)Weigh yourself daily, call if you gain more than 3 pounds in 1 day or more than 5 pounds in 1 week as your diuretic medications may need to be adjusted 3)Avoid ibuprofen /Advil /Aleve/Motrin Josefine Powders/Naproxen/BC powders/Meloxicam/Diclofenac/Indomethacin and other Nonsteroidal anti-inflammatory medications as these will make you more likely to bleed and can cause stomach ulcers, can also cause Kidney problems.  4)Watch for bleeding while on Blood Thinners--watch for blood in your stool which can make your stool black, maroon, mahogany or red---, blood in your urine which can make your urine pink or red, nosebleeds , also watch for possible bruising -You are taking Apixaban /Eliquis --- which is a blood thinner--- be careful to avoid injury or falls 5)Repeat CBC and BMP within 1 week

## 2023-12-27 NOTE — Progress Notes (Signed)
 Mobility Specialist Progress Note:    12/27/23 1320  Mobility  Activity Ambulated with assistance  Level of Assistance Modified independent, requires aide device or extra time  Assistive Device Four point cane  Distance Ambulated (ft) 240 ft  Range of Motion/Exercises Active;All extremities  Activity Response Tolerated well  Mobility Referral Yes  Mobility visit 1 Mobility  Mobility Specialist Start Time (ACUTE ONLY) 1320  Mobility Specialist Stop Time (ACUTE ONLY) 1340  Mobility Specialist Time Calculation (min) (ACUTE ONLY) 20 min   Pt received in chair, agreeable to mobility. ModI to stand and ambulate with quad cane. Tolerated well, SpO2 96% on RA and HR 67 at rest. During ambulation SpO2 92-95% on RA and HR peaked at 79. Returned supine, all needs met.   Faith Branan Mobility Specialist Please contact via Special Educational Needs Teacher or  Rehab office at [phone]

## 2023-12-27 NOTE — Plan of Care (Signed)
  Problem: Fluid Volume: Goal: Ability to maintain a balanced intake and output will improve Outcome: Progressing   Problem: Metabolic: Goal: Ability to maintain appropriate glucose levels will improve Outcome: Progressing   Problem: Education: Goal: Knowledge of General Education information will improve Description: Including pain rating scale, medication(s)/side effects and non-pharmacologic comfort measures Outcome: Progressing   Problem: Clinical Measurements: Goal: Ability to maintain clinical measurements within normal limits will improve Outcome: Progressing   Problem: Activity: Goal: Risk for activity intolerance will decrease Outcome: Progressing   Problem: Nutrition: Goal: Adequate nutrition will be maintained Outcome: Progressing   Problem: Elimination: Goal: Will not experience complications related to bowel motility Outcome: Progressing Goal: Will not experience complications related to urinary retention Outcome: Progressing   Problem: Pain Managment: Goal: General experience of comfort will improve and/or be controlled Outcome: Progressing

## 2023-12-28 ENCOUNTER — Telehealth: Payer: Self-pay

## 2023-12-28 NOTE — Transitions of Care (Post Inpatient/ED Visit) (Signed)
   12/28/2023  Name: Lawrence James Schwartzman Jr. MRN: [id number] DOB: 03-15-1950  Today's TOC FU Call Status: Today's TOC FU Call Status:: Unsuccessful Call (1st Attempt) Unsuccessful Call (1st Attempt) Date: 12/28/23  Attempted to reach the patient regarding the most recent Inpatient/ED visit. Call went right to voice mail. TOC RN CM left confidential message on DPR listed cell phone with call back number.   Follow Up Plan: Additional outreach attempts will be made to reach the patient to complete the Transitions of Care (Post Inpatient/ED visit) call.    Bing Edison MSN, RN RN Case Sales [facility]  [facility] Office Hours M-F [phone] Direct Dial: [phone] Main Phone [phone]  Fax: [phone] [email or web address]

## 2023-12-29 ENCOUNTER — Telehealth: Payer: Self-pay

## 2023-12-29 LAB — CULTURE, BLOOD (ROUTINE X 2)
Culture: NO GROWTH
Culture: NO GROWTH

## 2023-12-29 NOTE — Transitions of Care (Post Inpatient/ED Visit) (Signed)
   12/29/2023  Name: Lawrence James Lea Jr. MRN: [id number] DOB: 08-Nov-1950  Today's TOC FU Call Status: Today's TOC FU Call Status:: Unsuccessful Call (2nd Attempt) Unsuccessful Call (2nd Attempt) Date: 12/29/23  Attempted to reach the patient regarding the most recent Inpatient/ED visit.  Follow Up Plan: Additional outreach attempts will be made to reach the patient to complete the Transitions of Care (Post Inpatient/ED visit) call.    Bing Edison MSN, RN RN Case Sales [facility]  [facility] Office Hours M-F [phone] Direct Dial: [phone] Main Phone [phone]  Fax: [phone] [email or web address]

## 2023-12-30 ENCOUNTER — Telehealth: Payer: Self-pay | Admitting: *Deleted

## 2023-12-30 NOTE — Transitions of Care (Post Inpatient/ED Visit) (Signed)
 12/30/2023  Name: Lawrence James Burditt Jr. MRN: [id number] DOB: 1950/03/07  Today's TOC FU Call Status: Today's TOC FU Call Status:: Successful TOC FU Call Completed TOC FU Call Complete Date: 12/30/23  Patient's Name and Date of Birth confirmed. Name, DOB  Transition Care Management Follow-up Telephone Call Date of Discharge: 12/27/23 [facility] Type of Discharge: Inpatient Admission Primary Inpatient Discharge Diagnosis:: Acute respiratory failure with hypoxia How have you been since you were released from the hospital?: Better Any questions or concerns?: No  Items Reviewed: Did you receive and understand the discharge instructions provided?: Yes Medications obtained,verified, and reconciled?: Yes (Medications Reviewed) Any new allergies since your discharge?: No Dietary orders reviewed?: Yes Type of Diet Ordered:: low sodium, heart healthy Do you have support at home?: Yes People in Home [RPT]: spouse Name of Support/Comfort Primary Source: spouse/Mary Jama  Medications Reviewed Today: Medications Reviewed Today     Reviewed by Lucky Andrea LABOR, RN (Registered Nurse) on 12/30/23 at 1316  Med List Status: <None>   Medication Order Taking? Sig Documenting Provider Last Dose Status Informant  acetaminophen  (TYLENOL ) 650 MG CR tablet [id number] Yes Take 1,300 mg by mouth in the morning, at noon, and at bedtime. [provider]  Active Spouse/Significant Other, Pharmacy Records  amiodarone  (PACERONE ) 200 MG tablet [id number] Yes Take 1 tablet (200 mg total) by mouth daily. Pearlean Manus, MD  Active   atorvastatin  (LIPITOR) 20 MG tablet [id number] Yes Take 20 mg by mouth daily. [provider]  Active Spouse/Significant Other, Pharmacy Records  Blood Glucose Monitoring Suppl [facility] VERIO) w/Device PRESSLEY [id number]  Use to check blood sugar twice a day Von Pacific, MD  Active Spouse/Significant Other, Pharmacy Records  calcium  carbonate  (TUMS EX) 750 MG chewable tablet [id number] Yes Chew 1,500 mg by mouth at bedtime as needed for heartburn. [provider]  Active Spouse/Significant Other, Pharmacy Records  Calcium  Carbonate-Vitamin D  (CALCIUM -D PO) [id number] Yes Take 1 tablet by mouth daily. [provider]  Active Spouse/Significant Other, Pharmacy Records  docusate sodium  (COLACE) 100 MG capsule [id number] Yes Take 100 mg by mouth every other day. [provider]  Active Spouse/Significant Other, Pharmacy Records  dorzolamide -timolol  (COSOPT ) 2-0.5 % ophthalmic solution [id number] Yes Place 1 drop into the right eye 2 (two) times daily. [provider]  Active Spouse/Significant Other, Pharmacy Records  ELIQUIS  5 MG TABS tablet [id number] Yes TAKE 1 TABLET BY MOUTH TWICE A DAY Mealor, Augustus E, MD  Active Spouse/Significant Other, Pharmacy Records  furosemide  (LASIX ) 20 MG tablet [id number] Yes Take 2 tablets (40 mg total) by mouth daily. May take an extra tablet daily as needed for weight gain Pearlean Manus, MD  Active   gabapentin  (NEURONTIN ) 100 MG capsule [id number] Yes Take 200 mg by mouth at bedtime. [provider]  Active Spouse/Significant Other, Pharmacy Records  glucose blood [facility] VERIO) test strip [id number]  USE TO CHECK BLOOD SUGAR 2 TIMES DAILY Von Pacific, MD  Active Spouse/Significant Other, Pharmacy Records  insulin  degludec (TRESIBA  FLEXTOUCH) 200 UNIT/ML FlexTouch Pen [id number] Yes Inject 90 Units into the skin daily. At dinner time Thapa, Sudan, MD  Active Spouse/Significant Other, Pharmacy Records  Insulin  Pen Needle (BD PEN NEEDLE NANO 2ND GEN) 32G X 4 MM MISC [id number] Yes USE PEN NEEDLE AS INSTRUCTED TO INJECT INSULIN  4 TIMES DAILY. Von Pacific, MD  Active Spouse/Significant Other, Pharmacy Records  latanoprost  (XALATAN ) 0.005 % ophthalmic solution [id number] Yes Place 1 drop into both eyes at bedtime.  [provider]  Active Spouse/Significant Other,  Pharmacy Records  levocetirizine (XYZAL) 5 MG tablet [id number] Yes Take 5 mg by mouth every morning. [provider]  Active Spouse/Significant Other, Pharmacy Records  losartan  (COZAAR ) 100 MG tablet [id number] Yes Take 1 tablet (100 mg total) by mouth daily. Pearlean Manus, MD  Active   magnesium  oxide (MAG-OX) 400 MG tablet [id number] Yes Take 1 tablet (400 mg total) by mouth 2 (two) times daily. Alvan Dorn FALCON, MD  Active Spouse/Significant Other, Pharmacy Records  metFORMIN  (GLUCOPHAGE ) 1000 MG tablet [id number] Yes TAKE 1 TABLET TWICE DAILY WITH FOOD, PLEASE MAKE FOLLOW-UP APPOINTMENT Thapa, Sudan, MD  Active Spouse/Significant Other, Pharmacy Records  metoprolol  succinate (TOPROL -XL) 100 MG 24 hr tablet [id number] Yes Take 1 tablet (100 mg total) by mouth in the morning and at bedtime. Take with or immediately following a meal. Mealor, Augustus E, MD  Active Spouse/Significant Other, Pharmacy Records  Multiple Vitamin (MULTIVITAMIN) tablet [id number] Yes Take 1 tablet by mouth daily.   [provider]  Active Spouse/Significant Other, Pharmacy Records  pantoprazole  (PROTONIX ) 40 MG tablet [id number] Yes TAKE 1 TABLET (40 MG) BY MOUTH ONCE DAILY BEFORE A MEAL Carver, Charles K, DO  Active Spouse/Significant Other, Pharmacy Records  potassium chloride  SA (KLOR-CON  M20) 20 MEQ tablet [id number] Yes Take 1 tablet (20 mEq total) by mouth 3 (three) times daily. Terra Fairy PARAS, PA-C  Active Spouse/Significant Other, Pharmacy Records  Semaglutide , 2 MG/DOSE, (OZEMPIC , 2 MG/DOSE,) 8 MG/3ML SOPN [id number] Yes Inject 2 mg weekly Thapa, Sudan, MD  Active Spouse/Significant Other, Pharmacy Records  sildenafil (VIAGRA) 100 MG tablet [id number] Yes Take 100 mg by mouth as needed for erectile dysfunction. [provider]  Active Spouse/Significant Other, Pharmacy Records  tadalafil  (CIALIS ) 5 MG tablet [id number] Yes Take 5 mg by mouth daily. [provider]  Active  Spouse/Significant Other, Pharmacy Records  tamsulosin  (FLOMAX ) 0.4 MG CAPS capsule [id number] Yes Take 0.4 mg by mouth 2 (two) times daily. [provider]  Active Spouse/Significant Other, Pharmacy Records            Home Care and Equipment/Supplies: Were Home Health Services Ordered?: No Any new equipment or medical supplies ordered?: No  Functional Questionnaire: Do you need assistance with bathing/showering or dressing?: No Do you need assistance with meal preparation?: No Do you need assistance with eating?: No Do you have difficulty maintaining continence: No Do you need assistance with getting out of bed/getting out of a chair/moving?: No Do you have difficulty managing or taking your medications?: No  Follow up appointments reviewed: PCP Follow-up appointment confirmed?: Yes Date of PCP follow-up appointment?: 01/09/24 Follow-up Provider: Dr. Trudy Specialist [facility] Follow-up appointment confirmed?: Yes Date of Specialist follow-up appointment?: 01/02/24 Follow-Up Specialty Provider:: Cardiology Do you need transportation to your follow-up appointment?: No Do you understand care options if your condition(s) worsen?: Yes-patient verbalized understanding  SDOH Interventions Today    Flowsheet Row Most Recent Value  SDOH Interventions   Food Insecurity Interventions Intervention Not Indicated  Housing Interventions Intervention Not Indicated  Transportation Interventions Intervention Not Indicated  Utilities Interventions Intervention Not Indicated    Andrea Dimes RN, BSN [place]  [facility] RN Care Manager [phone]

## 2024-01-02 ENCOUNTER — Ambulatory Visit: Attending: Cardiovascular Disease | Admitting: Cardiovascular Disease

## 2024-01-02 VITALS — BP 106/60 | HR 65 | Ht 71.0 in | Wt 340.0 lb

## 2024-01-02 DIAGNOSIS — N401 Enlarged prostate with lower urinary tract symptoms: Secondary | ICD-10-CM | POA: Diagnosis not present

## 2024-01-02 DIAGNOSIS — I4819 Other persistent atrial fibrillation: Secondary | ICD-10-CM

## 2024-01-02 DIAGNOSIS — I484 Atypical atrial flutter: Secondary | ICD-10-CM | POA: Diagnosis not present

## 2024-01-02 DIAGNOSIS — N5201 Erectile dysfunction due to arterial insufficiency: Secondary | ICD-10-CM | POA: Diagnosis not present

## 2024-01-02 NOTE — Progress Notes (Signed)
    PCP: Trudy Vaughn FALCON, Lawrence Jordan   Primary EP:  [name] [name] is a 73 y.o. male who presents today for routine electrophysiology followup.  Since last being seen in our clinic, the patient reports doing reasonably well.    He was very fatigued due to recurrent anemia due to GI losses. This appears to be under control now. I had referred him for a Watchman, but the procedure was deferred since his GI bleed appeared resolved.  He has had 2 ablations for atrial fibrillation by Dr. Kelsie.  The most recent was in October 2021.  Pulmonary vein isolation was reinforced and a posterior wall ablation performed.  Dr. Kelsie noted that the patient had a complex atypical flutter with variable cycle length that was not mappable.  Pacemaker was placed in 2022 due to issues with tachy-brady and difficult rate control.  He underwent Tikosyn  load in May 2024.  Despite the Tikosyn , he has had a few breakthrough episodes of atrial flutter, sometimes with rapid rates.  He had discontinue metoprolol , so this was resumed. He reports that he is doing well.   He was admitted on December 26, 2022  with severe tachycardia, heart rates in excess of 200 bpm.  He had missed a dose of metoprolol  EGM's most consistent with one-to-one flutter.  The arrhythmia was not responsive to adenosine .  He was eventually DC cardioverted with a single 100 J shock.  He switched from Tikosyn  to amiodarone  but he continues to have recurrences of AF, often with poorly controlled rates.  His AF burden has been about 15%. With recent occurrence, the amiodarone  was increased to 200mg  BID, and he was tentatively scheduled for an AVJ ablation.  At her last visit, metoprolol  was increased to 100 mg twice daily.  In the interim, he was admitted to the hospital with flash pulmonary edema due to hypertensive emergency.  He notes baseline fatigue and somnolence.  Physical Exam: Vitals:   01/02/24 1429  BP: 106/60  Pulse: 65   Weight: (!) 340 lb (154.2 kg)  Height: 5' 11 (1.803 m)  PF: 94 L/min      Gen: Appears comfortable, well-nourished CV: tachy, regular, + dependent edema. No murmurs, rubs. Basilar rales clear with deep breaths. No JVD sitting upright Pulm: breathing easily   Pacemaker interrogation- reviewed in detail today,  See PACEART report       Assessment and Plan:  Persistent afib and atrial flutter He had recurrence of atrial flutter despite Tikosyn   Having rare but sustained episodes of atrial arrhythmia on amiodarone   Will decrease amiodarone  back down to 200 mg daily Labs September 09, 2023 reviewed He has had acute CHF in the past due to AF/flutter, Chads2vasc score is 6.   Continue Eliquis  5 mg twice daily Continue metoprolol  to 100mg  PO BID  Continue amiodarone  200mg  daily Will postpone the AVJ ablation --need to gather more information about A-fib rates on his current dose of metoprolol  He is quite fatigued on metoprolol  100 mg twice daily  Symptomatic sinus bradycardia  Normal pacemaker function See Pace Art report he is not device dependant today  HTN Stable No change required today  Anemia Secondary to GI bleeding/ iron  deficiency anemia Follows closely with GI (Dr Cindie)  Obesity Body mass index is 47.42 kg/m. Lifestyle modification advised  OSA Compliance with CPAP advised  Chronic diastolic dysfunction Stable No change required today  Follow-up 3 months  Lawrence Jordan [name], Lawrence Jordan  01/02/2024 2:31 PM

## 2024-01-02 NOTE — Patient Instructions (Signed)
 Medication Instructions:  Your physician recommends that you continue on your current medications as directed. Please refer to the Current Medication list given to you today.  *If you need a refill on your cardiac medications before your next appointment, please call your pharmacy*  Lab Work: None ordered.  If you have labs (blood work) drawn today and your tests are completely normal, you will receive your results only by: MyChart Message (if you have MyChart) OR A paper copy in the mail If you have any lab test that is abnormal or we need to change your treatment, we will call you to review the results.  Testing/Procedures: None ordered.   Follow-Up: At [facility], you and your health needs are our priority.  As part of our continuing mission to provide you with exceptional heart care, our providers are all part of one team.  This team includes your primary Cardiologist (physician) and Advanced Practice Providers or APPs (Physician Assistants and Nurse Practitioners) who all work together to provide you with the care you need, when you need it.  Your next appointment:   4 weeks with Dr Mealor

## 2024-01-09 ENCOUNTER — Other Ambulatory Visit: Payer: Self-pay

## 2024-01-09 ENCOUNTER — Encounter: Payer: Self-pay | Admitting: Endocrinology

## 2024-01-09 DIAGNOSIS — E1165 Type 2 diabetes mellitus with hyperglycemia: Secondary | ICD-10-CM

## 2024-01-09 MED ORDER — BD PEN NEEDLE NANO 2ND GEN 32G X 4 MM MISC: 4 refills | Status: AC

## 2024-01-09 MED ORDER — ONETOUCH VERIO VI STRP: ORAL_STRIP | 3 refills | Status: AC

## 2024-01-15 ENCOUNTER — Other Ambulatory Visit: Payer: Self-pay | Admitting: Cardiovascular Disease

## 2024-01-26 ENCOUNTER — Encounter: Payer: Self-pay | Admitting: Gastroenterology

## 2024-02-03 ENCOUNTER — Other Ambulatory Visit (HOSPITAL_COMMUNITY): Payer: Self-pay | Admitting: Surgery

## 2024-02-03 DIAGNOSIS — M5414 Radiculopathy, thoracic region: Secondary | ICD-10-CM

## 2024-02-06 ENCOUNTER — Ambulatory Visit: Attending: Cardiovascular Disease | Admitting: Cardiovascular Disease

## 2024-02-06 ENCOUNTER — Encounter: Payer: Self-pay | Admitting: Cardiovascular Disease

## 2024-02-06 VITALS — BP 124/86 | HR 65 | Ht 71.0 in | Wt 347.0 lb

## 2024-02-06 DIAGNOSIS — I495 Sick sinus syndrome: Secondary | ICD-10-CM | POA: Insufficient documentation

## 2024-02-06 DIAGNOSIS — I484 Atypical atrial flutter: Secondary | ICD-10-CM | POA: Insufficient documentation

## 2024-02-06 DIAGNOSIS — Z01812 Encounter for preprocedural laboratory examination: Secondary | ICD-10-CM | POA: Diagnosis not present

## 2024-02-06 DIAGNOSIS — I4819 Other persistent atrial fibrillation: Secondary | ICD-10-CM | POA: Diagnosis not present

## 2024-02-06 DIAGNOSIS — R001 Bradycardia, unspecified: Secondary | ICD-10-CM | POA: Diagnosis not present

## 2024-02-06 LAB — CUP PACEART INCLINIC DEVICE CHECK
Date Time Interrogation Session: 20260112125644
Implantable Lead Connection Status: 753985
Implantable Lead Connection Status: 753985
Implantable Lead Implant Date: 20221108
Implantable Lead Implant Date: 20221108
Implantable Lead Location: 753859
Implantable Lead Location: 753860
Implantable Pulse Generator Implant Date: 20221108
Pulse Gen Model: 2272
Pulse Gen Serial Number: 3968031

## 2024-02-06 MED ORDER — AMIODARONE HCL 200 MG PO TABS: 200.0000 mg | ORAL_TABLET | Freq: Every day | ORAL | 1 refills | Status: AC

## 2024-02-06 NOTE — Patient Instructions (Signed)
 Medication Instructions:  Your physician recommends that you continue on your current medications as directed. Please refer to the Current Medication list given to you today.  *If you need a refill on your cardiac medications before your next appointment, please call your pharmacy*  Testing/Procedures: Ablation Your physician has recommended that you have an ablation. Catheter ablation is a medical procedure used to treat some cardiac arrhythmias (irregular heartbeats). During catheter ablation, a long, thin, flexible tube is put into a blood vessel in your groin (upper thigh), or neck. This tube is called an ablation catheter. It is then guided to your heart through the blood vessel. Radio frequency waves destroy small areas of heart tissue where abnormal heartbeats may cause an arrhythmia to start.   You are scheduled for AV Node ablation on Thursday, February 26 with Dr. Dr. [name]. Please arrive at the Main Entrance A at [facility]: [address] [place], [place] [address] at 5:30 AM   What To Expect:  Labs: you will need to have lab work drawn within 30 days of your procedure. Please go to any [facility] location to have these drawn - no appointment is needed.  You will receive procedure instructions either through MyChart or in the mail 4-6 week prior to your procedure.  After your procedure we recommend no driving for 4 days, no lifting over 5 lbs for 7 days, and no work or strenuous activity for 7 days.  Please contact our office at [phone] if you have any questions.    Follow-Up: We will contact you to schedule your post-procedure appointments.

## 2024-02-06 NOTE — Progress Notes (Signed)
 "   PCP: Trudy Vaughn FALCON, MD   Primary EP:  Dr [name] Huntington Jemmie Rhinehart. is a 74 y.o. male who presents today for routine electrophysiology followup.  Since last being seen in our clinic, the patient reports doing reasonably well.    He was very fatigued due to recurrent anemia due to GI losses. This appears to be under control now. I had referred him for a Watchman, but the procedure was deferred since his GI bleed appeared resolved.  He has had 2 ablations for atrial fibrillation by Dr. Kelsie.  The most recent was in October 2021.  Pulmonary vein isolation was reinforced and a posterior wall ablation performed.  Dr. Kelsie noted that the patient had a complex atypical flutter with variable cycle length that was not mappable.  Pacemaker was placed in 2022 due to issues with tachy-brady and difficult rate control.  He underwent Tikosyn  load in May 2024.  Despite the Tikosyn , he has had a few breakthrough episodes of atrial flutter, sometimes with rapid rates.  He had discontinue metoprolol , so this was resumed. He reports that he is doing well.   He was admitted on December 26, 2022  with severe tachycardia, heart rates in excess of 200 bpm.  He had missed a dose of metoprolol  EGM's most consistent with one-to-one flutter.  The arrhythmia was not responsive to adenosine .  He was eventually DC cardioverted with a single 100 J shock.  He switched from Tikosyn  to amiodarone  but he continues to have recurrences of AF, often with poorly controlled rates.  His AF burden has been about 15%. With recent occurrence, the amiodarone  was increased to 200mg  BID, and he was tentatively scheduled for an AVJ ablation.  At her last visit, metoprolol  was increased to 100 mg twice daily.  In the interim, he was admitted to the hospital with flash pulmonary edema due to hypertensive emergency.  He notes baseline fatigue and somnolence.  Physical Exam: There were no vitals filed for this  visit.     Gen: Appears comfortable, well-nourished CV: tachy, regular, + dependent edema. No murmurs, rubs. Basilar rales clear with deep breaths. No JVD sitting upright Pulm: breathing easily   Pacemaker interrogation- reviewed in detail today,  See PACEART report       Assessment and Plan:  Persistent afib and atrial flutter He had recurrence of atrial flutter despite Tikosyn   Having rare but sustained episodes of atrial arrhythmia on amiodarone   Will decrease amiodarone  back down to 200 mg daily Labs September 09, 2023 reviewed He has had acute CHF in the past due to AF/flutter, Chads2vasc score is 6.   Continue Eliquis  5 mg twice daily Continue metoprolol  to 100mg  PO BID  Continue amiodarone  200mg  daily He is quite fatigued on metoprolol  100 mg twice daily He continues to have rapid rates with atrial fibrillation as detected by his device today I do not see that further rate control will be possible given that he is already on amiodarone  and 200 metoprolol  daily with fatigue.  Rates are averaging about 140 bpm in atrial fibrillation, and episodes are lasting several hours.  He has previously had admissions for heart failure with atrial fibrillation and RVR We reviewed and discussed rationale and logistics of the AV node ablation procedure.  We will schedule the procedure today.  Symptomatic sinus bradycardia  Normal pacemaker function See Elisabeth Art report he is not device dependant today  HTN Stable No change required today  Anemia Secondary to GI  bleeding/ iron  deficiency anemia Follows closely with GI (Dr Cindie)  Obesity There is no height or weight on file to calculate BMI. Lifestyle modification advised  OSA Compliance with CPAP advised  Chronic diastolic dysfunction Stable No change required today  Follow-up 3 months  Lawrence FORBES Furbish, MD  02/06/2024 8:45 AM "

## 2024-02-09 ENCOUNTER — Encounter (INDEPENDENT_AMBULATORY_CARE_PROVIDER_SITE_OTHER): Payer: Self-pay | Admitting: *Deleted

## 2024-02-10 ENCOUNTER — Encounter: Payer: Self-pay | Admitting: Endocrinology

## 2024-02-10 ENCOUNTER — Other Ambulatory Visit: Payer: Self-pay

## 2024-02-13 ENCOUNTER — Other Ambulatory Visit: Payer: Self-pay

## 2024-02-13 DIAGNOSIS — E1165 Type 2 diabetes mellitus with hyperglycemia: Secondary | ICD-10-CM

## 2024-02-13 MED ORDER — TOUJEO MAX SOLOSTAR 300 UNIT/ML ~~LOC~~ SOPN: PEN_INJECTOR | SUBCUTANEOUS | 0 refills | Status: AC

## 2024-02-13 NOTE — Telephone Encounter (Signed)
 Yes please with same dose.

## 2024-02-21 ENCOUNTER — Encounter (HOSPITAL_COMMUNITY): Payer: Self-pay

## 2024-02-21 ENCOUNTER — Telehealth: Payer: Self-pay

## 2024-02-21 ENCOUNTER — Telehealth (HOSPITAL_COMMUNITY): Payer: Self-pay

## 2024-02-21 NOTE — Telephone Encounter (Signed)
 Attempted to reach patient to discuss upcoming procedure, no answer. Left VM for patient to return call.

## 2024-02-21 NOTE — Telephone Encounter (Signed)
-----   Message from Nurse Aldona SAUNDERS, RN sent at 02/06/2024  6:32 PM EST ----- Regarding: AVNA 2/26 Mealor Important: list procedure date as first item in subject line, followed by procedure type (e.g., 10/07/23 AFib ablation)  Precert:  MD: Mealor Type of ablation: AV node Diagnosis: Afib CPT code: AV node (06349) Ablation scheduled (date/time): 2/26 730am  Procedure:  Added to calendar? Yes Orders entered? No, >30 days before procedure Letter complete? No, >30 days before procedure Scheduled with cath lab? Yes Any medications to hold? No Labs ordered (CBC, BMET, PT/INR if on warfarin): Yes Mapping system: CARTO (lab 4 or 6) CARTO/OPAL rep notified? Yes Cardiac CT needed? No Dye allergy? Yes Pre-meds ordered and instructions given? N/A Letter method: MyChart H&P: 1/12 Device: PPM-STJ  Follow-up:  Cassie/Angel, please schedule Routine.  Covering RN - please send this message to [employer], EP scheduler, EP Scheduling pool, EP [employer], and CT scheduler (Brittany Lynch/Stephanie Mogg), if indicated.

## 2024-02-21 NOTE — Telephone Encounter (Signed)
 Spoke with patient to complete pre-procedure call.     Health status review:  Any new medical conditions, recent signs of acute illness or been started on antibiotics? No Any recent hospitalizations or surgeries? No Any new medications started since pre-op visit? No  Follow all medication instructions prior to procedure or the procedure may be rescheduled:    Continue taking Eliquis  (Apixaban ) twice daily without missing any doses before procedure. Essential chronic medications:  No medication should be continued, unless told otherwise.  HOLD Semaglutide  (Ozempic , Rybelsus , Wegovy ) for 7 days prior to your procedure. Last dose on February 16.  Toujeo  (Insulin ) - ONLY take 1/2 of your usual dose the night before your procedure. On the morning of your procedure DO NOT take any medication., including Eliquis  (Apixaban ).  Nothing to eat or drink after midnight prior to your procedure.  Pre-procedure testing scheduled: lab work by February 12.  Confirmed patient is scheduled for AV Node on Thursday, February 26 with Dr. [name]. Instructed patient to arrive at the Main Entrance A at [facility]: [address] [place], [place] [address] and check in at Admitting at 5:30 AM.  Plan to go home the same day, you will only stay overnight if medically necessary. You MUST have a responsible adult to drive you home and MUST be with you the first 24 hours after you arrive home or your procedure could be cancelled.  Informed a nurse may call a day before the procedure to confirm arrival time and ensure instructions are followed.  Patient verbalized understanding to information provided and is agreeable to proceed with procedure.   Advised to contact RN Navigator at [phone], to inform of any new medications started after call or concerns prior to procedure.

## 2024-02-22 ENCOUNTER — Other Ambulatory Visit (HOSPITAL_COMMUNITY): Payer: Self-pay | Admitting: Internal Medicine

## 2024-02-28 ENCOUNTER — Ambulatory Visit: Payer: Medicare Other

## 2024-02-29 LAB — CUP PACEART REMOTE DEVICE CHECK
Battery Remaining Longevity: 74 mo
Battery Remaining Percentage: 70 %
Battery Voltage: 3.01 V
Brady Statistic AP VP Percent: 1.5 %
Brady Statistic AP VS Percent: 96 %
Brady Statistic AS VP Percent: 1 %
Brady Statistic AS VS Percent: 1.1 %
Brady Statistic RA Percent Paced: 96 %
Brady Statistic RV Percent Paced: 1.6 %
Date Time Interrogation Session: 20260203035054
Implantable Lead Connection Status: 753985
Implantable Lead Connection Status: 753985
Implantable Lead Implant Date: 20221108
Implantable Lead Implant Date: 20221108
Implantable Lead Location: 753859
Implantable Lead Location: 753860
Implantable Pulse Generator Implant Date: 20221108
Lead Channel Impedance Value: 440 Ohm
Lead Channel Impedance Value: 490 Ohm
Lead Channel Pacing Threshold Amplitude: 0.5 V
Lead Channel Pacing Threshold Amplitude: 0.75 V
Lead Channel Pacing Threshold Pulse Width: 0.5 ms
Lead Channel Pacing Threshold Pulse Width: 0.5 ms
Lead Channel Sensing Intrinsic Amplitude: 12 mV
Lead Channel Sensing Intrinsic Amplitude: 2.6 mV
Lead Channel Setting Pacing Amplitude: 2 V
Lead Channel Setting Pacing Amplitude: 2.5 V
Lead Channel Setting Pacing Pulse Width: 0.5 ms
Lead Channel Setting Sensing Sensitivity: 2 mV
Pulse Gen Model: 2272
Pulse Gen Serial Number: 3968031

## 2024-03-01 ENCOUNTER — Ambulatory Visit: Payer: Self-pay

## 2024-03-01 LAB — CBC
Hematocrit: 44.6 % (ref 37.5–51.0)
Hemoglobin: 14.7 g/dL (ref 13.0–17.7)
MCH: 31.7 pg (ref 26.6–33.0)
MCHC: 33 g/dL (ref 31.5–35.7)
MCV: 96 fL (ref 79–97)
Platelets: 175 10*3/uL (ref 150–450)
RBC: 4.64 x10E6/uL (ref 4.14–5.80)
RDW: 13.3 % (ref 11.6–15.4)
WBC: 6.4 10*3/uL (ref 3.4–10.8)

## 2024-03-01 LAB — BASIC METABOLIC PANEL WITH GFR
BUN/Creatinine Ratio: 14 (ref 10–24)
BUN: 13 mg/dL (ref 8–27)
CO2: 20 mmol/L (ref 20–29)
Calcium: 9.7 mg/dL (ref 8.6–10.2)
Chloride: 99 mmol/L (ref 96–106)
Creatinine, Ser: 0.91 mg/dL (ref 0.76–1.27)
Glucose: 171 mg/dL — ABNORMAL HIGH (ref 70–99)
Potassium: 4.4 mmol/L (ref 3.5–5.2)
Sodium: 139 mmol/L (ref 134–144)
eGFR: 89 mL/min/{1.73_m2}

## 2024-03-02 ENCOUNTER — Telehealth: Payer: Self-pay | Admitting: Pharmacy Technician

## 2024-03-02 ENCOUNTER — Other Ambulatory Visit (HOSPITAL_COMMUNITY): Payer: Self-pay

## 2024-03-02 NOTE — Telephone Encounter (Signed)
 Pharmacy Patient Advocate Encounter   Received notification from [employer] that prior authorization for Ozempic  (2 MG/DOSE) 8MG /3ML pen-injectors  is due for renewal.   Insurance verification completed.   The patient is insured through [facility].  Action: PA required; PA submitted to above mentioned insurance via Latent Key/confirmation #/EOC [id number] Status is pending

## 2024-03-02 NOTE — Telephone Encounter (Signed)
 Pharmacy Patient Advocate Encounter  Received notification from [facility] MEDICARE that Prior Authorization for Ozempic  (2 MG/DOSE) 8MG /3ML pen-injectors  has been APPROVED from 03/02/24 to 01/24/25. Unable to obtain price due to refill too soon rejection, last fill date 02/29/24 next available fill date 05/02/24   PA #/Case ID/Reference #: [id number]

## 2024-03-13 ENCOUNTER — Ambulatory Visit (HOSPITAL_COMMUNITY)

## 2024-03-22 ENCOUNTER — Ambulatory Visit (HOSPITAL_COMMUNITY): Admit: 2024-03-22 | Admitting: Cardiovascular Disease

## 2024-03-22 ENCOUNTER — Encounter (HOSPITAL_COMMUNITY): Payer: Self-pay

## 2024-03-22 SURGERY — AV NODE ABLATION
Anesthesia: General

## 2024-03-29 ENCOUNTER — Ambulatory Visit (HOSPITAL_COMMUNITY): Admitting: Physician Assistant

## 2024-04-02 ENCOUNTER — Ambulatory Visit: Admitting: Orthopedic Surgery

## 2024-04-05 ENCOUNTER — Ambulatory Visit: Admitting: Endocrinology

## 2024-04-23 ENCOUNTER — Ambulatory Visit: Admitting: Podiatry

## 2024-05-29 ENCOUNTER — Ambulatory Visit: Payer: Medicare Other
